# Patient Record
Sex: Male | Born: 1935
Health system: Southern US, Community
[De-identification: ages and names within clinical notes are randomized; demographics above are authoritative.]

## PROBLEM LIST (undated history)

## (undated) DIAGNOSIS — R1310 Dysphagia, unspecified: Secondary | ICD-10-CM

## (undated) DIAGNOSIS — G309 Alzheimer's disease, unspecified: Secondary | ICD-10-CM

## (undated) DIAGNOSIS — I82409 Acute embolism and thrombosis of unspecified deep veins of unspecified lower extremity: Secondary | ICD-10-CM

## (undated) DIAGNOSIS — E46 Unspecified protein-calorie malnutrition: Secondary | ICD-10-CM

## (undated) DIAGNOSIS — F0390 Unspecified dementia without behavioral disturbance: Secondary | ICD-10-CM

## (undated) DIAGNOSIS — N289 Disorder of kidney and ureter, unspecified: Secondary | ICD-10-CM

## (undated) DIAGNOSIS — U071 COVID-19: Secondary | ICD-10-CM

## (undated) DIAGNOSIS — G9341 Metabolic encephalopathy: Secondary | ICD-10-CM

## (undated) DIAGNOSIS — F0280 Dementia in other diseases classified elsewhere without behavioral disturbance: Secondary | ICD-10-CM

## (undated) DIAGNOSIS — K8042 Calculus of bile duct with acute cholecystitis without obstruction: Secondary | ICD-10-CM

## (undated) DIAGNOSIS — R41841 Cognitive communication deficit: Secondary | ICD-10-CM

## (undated) DIAGNOSIS — R55 Syncope and collapse: Secondary | ICD-10-CM

## (undated) DIAGNOSIS — E079 Disorder of thyroid, unspecified: Secondary | ICD-10-CM

## (undated) HISTORY — PX: APPENDECTOMY: SHX54

## (undated) HISTORY — PX: TONSILLECTOMY: SUR1361

---

## 2015-10-23 DIAGNOSIS — Z681 Body mass index (BMI) 19 or less, adult: Secondary | ICD-10-CM | POA: Diagnosis not present

## 2015-10-23 DIAGNOSIS — N39498 Other specified urinary incontinence: Secondary | ICD-10-CM | POA: Diagnosis not present

## 2015-10-23 DIAGNOSIS — R5383 Other fatigue: Secondary | ICD-10-CM | POA: Diagnosis not present

## 2015-10-23 DIAGNOSIS — I10 Essential (primary) hypertension: Secondary | ICD-10-CM | POA: Diagnosis not present

## 2016-01-18 DIAGNOSIS — Z681 Body mass index (BMI) 19 or less, adult: Secondary | ICD-10-CM | POA: Diagnosis not present

## 2016-01-18 DIAGNOSIS — I10 Essential (primary) hypertension: Secondary | ICD-10-CM | POA: Diagnosis not present

## 2016-01-18 DIAGNOSIS — G20 Parkinson's disease: Secondary | ICD-10-CM | POA: Diagnosis not present

## 2016-02-04 DIAGNOSIS — N178 Other acute kidney failure: Secondary | ICD-10-CM | POA: Diagnosis not present

## 2016-02-04 DIAGNOSIS — Z8249 Family history of ischemic heart disease and other diseases of the circulatory system: Secondary | ICD-10-CM | POA: Diagnosis not present

## 2016-02-04 DIAGNOSIS — R2689 Other abnormalities of gait and mobility: Secondary | ICD-10-CM | POA: Diagnosis not present

## 2016-02-04 DIAGNOSIS — Z880 Allergy status to penicillin: Secondary | ICD-10-CM | POA: Diagnosis not present

## 2016-02-04 DIAGNOSIS — F0280 Dementia in other diseases classified elsewhere without behavioral disturbance: Secondary | ICD-10-CM | POA: Diagnosis present

## 2016-02-04 DIAGNOSIS — F0390 Unspecified dementia without behavioral disturbance: Secondary | ICD-10-CM | POA: Diagnosis not present

## 2016-02-04 DIAGNOSIS — I10 Essential (primary) hypertension: Secondary | ICD-10-CM | POA: Diagnosis present

## 2016-02-04 DIAGNOSIS — E440 Moderate protein-calorie malnutrition: Secondary | ICD-10-CM | POA: Diagnosis not present

## 2016-02-04 DIAGNOSIS — R1310 Dysphagia, unspecified: Secondary | ICD-10-CM | POA: Diagnosis present

## 2016-02-04 DIAGNOSIS — R6889 Other general symptoms and signs: Secondary | ICD-10-CM | POA: Diagnosis present

## 2016-02-04 DIAGNOSIS — E870 Hyperosmolality and hypernatremia: Secondary | ICD-10-CM | POA: Diagnosis not present

## 2016-02-04 DIAGNOSIS — F329 Major depressive disorder, single episode, unspecified: Secondary | ICD-10-CM | POA: Diagnosis not present

## 2016-02-04 DIAGNOSIS — Z79899 Other long term (current) drug therapy: Secondary | ICD-10-CM | POA: Diagnosis not present

## 2016-02-04 DIAGNOSIS — Z87891 Personal history of nicotine dependence: Secondary | ICD-10-CM | POA: Diagnosis not present

## 2016-02-04 DIAGNOSIS — N19 Unspecified kidney failure: Secondary | ICD-10-CM | POA: Diagnosis not present

## 2016-02-04 DIAGNOSIS — D696 Thrombocytopenia, unspecified: Secondary | ICD-10-CM | POA: Diagnosis present

## 2016-02-04 DIAGNOSIS — G309 Alzheimer's disease, unspecified: Secondary | ICD-10-CM | POA: Diagnosis present

## 2016-02-04 DIAGNOSIS — R531 Weakness: Secondary | ICD-10-CM | POA: Diagnosis not present

## 2016-02-04 DIAGNOSIS — Z7982 Long term (current) use of aspirin: Secondary | ICD-10-CM | POA: Diagnosis not present

## 2016-02-04 DIAGNOSIS — Z681 Body mass index (BMI) 19 or less, adult: Secondary | ICD-10-CM | POA: Diagnosis not present

## 2016-02-04 DIAGNOSIS — E860 Dehydration: Secondary | ICD-10-CM | POA: Diagnosis not present

## 2016-02-04 DIAGNOSIS — M6281 Muscle weakness (generalized): Secondary | ICD-10-CM | POA: Diagnosis not present

## 2016-02-04 DIAGNOSIS — R1312 Dysphagia, oropharyngeal phase: Secondary | ICD-10-CM | POA: Diagnosis not present

## 2016-02-04 DIAGNOSIS — N179 Acute kidney failure, unspecified: Secondary | ICD-10-CM | POA: Diagnosis not present

## 2016-02-04 DIAGNOSIS — N390 Urinary tract infection, site not specified: Secondary | ICD-10-CM | POA: Diagnosis not present

## 2016-02-04 DIAGNOSIS — R7989 Other specified abnormal findings of blood chemistry: Secondary | ICD-10-CM | POA: Diagnosis not present

## 2016-02-04 DIAGNOSIS — Z825 Family history of asthma and other chronic lower respiratory diseases: Secondary | ICD-10-CM | POA: Diagnosis not present

## 2016-02-04 DIAGNOSIS — R930 Abnormal findings on diagnostic imaging of skull and head, not elsewhere classified: Secondary | ICD-10-CM | POA: Diagnosis not present

## 2016-02-08 DIAGNOSIS — Z681 Body mass index (BMI) 19 or less, adult: Secondary | ICD-10-CM | POA: Diagnosis not present

## 2016-02-08 DIAGNOSIS — R1312 Dysphagia, oropharyngeal phase: Secondary | ICD-10-CM | POA: Diagnosis not present

## 2016-02-08 DIAGNOSIS — N178 Other acute kidney failure: Secondary | ICD-10-CM | POA: Diagnosis not present

## 2016-02-08 DIAGNOSIS — F329 Major depressive disorder, single episode, unspecified: Secondary | ICD-10-CM | POA: Diagnosis not present

## 2016-02-08 DIAGNOSIS — E440 Moderate protein-calorie malnutrition: Secondary | ICD-10-CM | POA: Diagnosis not present

## 2016-02-08 DIAGNOSIS — N390 Urinary tract infection, site not specified: Secondary | ICD-10-CM | POA: Diagnosis not present

## 2016-02-08 DIAGNOSIS — R2689 Other abnormalities of gait and mobility: Secondary | ICD-10-CM | POA: Diagnosis not present

## 2016-02-08 DIAGNOSIS — E870 Hyperosmolality and hypernatremia: Secondary | ICD-10-CM | POA: Diagnosis not present

## 2016-02-08 DIAGNOSIS — M6281 Muscle weakness (generalized): Secondary | ICD-10-CM | POA: Diagnosis not present

## 2016-02-08 DIAGNOSIS — F0390 Unspecified dementia without behavioral disturbance: Secondary | ICD-10-CM | POA: Diagnosis not present

## 2016-02-08 DIAGNOSIS — R6889 Other general symptoms and signs: Secondary | ICD-10-CM | POA: Diagnosis not present

## 2016-02-08 DIAGNOSIS — N179 Acute kidney failure, unspecified: Secondary | ICD-10-CM | POA: Diagnosis not present

## 2016-02-29 DIAGNOSIS — F329 Major depressive disorder, single episode, unspecified: Secondary | ICD-10-CM | POA: Diagnosis not present

## 2016-02-29 DIAGNOSIS — F0390 Unspecified dementia without behavioral disturbance: Secondary | ICD-10-CM | POA: Diagnosis not present

## 2016-02-29 DIAGNOSIS — R1312 Dysphagia, oropharyngeal phase: Secondary | ICD-10-CM | POA: Diagnosis not present

## 2016-02-29 DIAGNOSIS — E440 Moderate protein-calorie malnutrition: Secondary | ICD-10-CM | POA: Diagnosis not present

## 2016-03-04 DIAGNOSIS — F0390 Unspecified dementia without behavioral disturbance: Secondary | ICD-10-CM | POA: Diagnosis not present

## 2016-03-04 DIAGNOSIS — R1312 Dysphagia, oropharyngeal phase: Secondary | ICD-10-CM | POA: Diagnosis not present

## 2016-03-04 DIAGNOSIS — F329 Major depressive disorder, single episode, unspecified: Secondary | ICD-10-CM | POA: Diagnosis not present

## 2016-03-04 DIAGNOSIS — E440 Moderate protein-calorie malnutrition: Secondary | ICD-10-CM | POA: Diagnosis not present

## 2016-03-07 DIAGNOSIS — Z1389 Encounter for screening for other disorder: Secondary | ICD-10-CM | POA: Diagnosis not present

## 2016-03-07 DIAGNOSIS — E860 Dehydration: Secondary | ICD-10-CM | POA: Diagnosis not present

## 2016-03-07 DIAGNOSIS — F0151 Vascular dementia with behavioral disturbance: Secondary | ICD-10-CM | POA: Diagnosis not present

## 2016-03-07 DIAGNOSIS — Z0000 Encounter for general adult medical examination without abnormal findings: Secondary | ICD-10-CM | POA: Diagnosis not present

## 2016-03-08 DIAGNOSIS — F329 Major depressive disorder, single episode, unspecified: Secondary | ICD-10-CM | POA: Diagnosis not present

## 2016-03-08 DIAGNOSIS — R1312 Dysphagia, oropharyngeal phase: Secondary | ICD-10-CM | POA: Diagnosis not present

## 2016-03-08 DIAGNOSIS — E440 Moderate protein-calorie malnutrition: Secondary | ICD-10-CM | POA: Diagnosis not present

## 2016-03-08 DIAGNOSIS — F0390 Unspecified dementia without behavioral disturbance: Secondary | ICD-10-CM | POA: Diagnosis not present

## 2016-03-11 DIAGNOSIS — F329 Major depressive disorder, single episode, unspecified: Secondary | ICD-10-CM | POA: Diagnosis not present

## 2016-03-11 DIAGNOSIS — F0390 Unspecified dementia without behavioral disturbance: Secondary | ICD-10-CM | POA: Diagnosis not present

## 2016-03-11 DIAGNOSIS — E440 Moderate protein-calorie malnutrition: Secondary | ICD-10-CM | POA: Diagnosis not present

## 2016-03-11 DIAGNOSIS — R1312 Dysphagia, oropharyngeal phase: Secondary | ICD-10-CM | POA: Diagnosis not present

## 2016-03-12 DIAGNOSIS — F329 Major depressive disorder, single episode, unspecified: Secondary | ICD-10-CM | POA: Diagnosis not present

## 2016-03-12 DIAGNOSIS — R1312 Dysphagia, oropharyngeal phase: Secondary | ICD-10-CM | POA: Diagnosis not present

## 2016-03-12 DIAGNOSIS — F0390 Unspecified dementia without behavioral disturbance: Secondary | ICD-10-CM | POA: Diagnosis not present

## 2016-03-12 DIAGNOSIS — E440 Moderate protein-calorie malnutrition: Secondary | ICD-10-CM | POA: Diagnosis not present

## 2016-03-14 DIAGNOSIS — F0390 Unspecified dementia without behavioral disturbance: Secondary | ICD-10-CM | POA: Diagnosis not present

## 2016-03-14 DIAGNOSIS — F329 Major depressive disorder, single episode, unspecified: Secondary | ICD-10-CM | POA: Diagnosis not present

## 2016-03-14 DIAGNOSIS — R1312 Dysphagia, oropharyngeal phase: Secondary | ICD-10-CM | POA: Diagnosis not present

## 2016-03-14 DIAGNOSIS — E440 Moderate protein-calorie malnutrition: Secondary | ICD-10-CM | POA: Diagnosis not present

## 2016-03-18 DIAGNOSIS — R1312 Dysphagia, oropharyngeal phase: Secondary | ICD-10-CM | POA: Diagnosis not present

## 2016-03-18 DIAGNOSIS — E440 Moderate protein-calorie malnutrition: Secondary | ICD-10-CM | POA: Diagnosis not present

## 2016-03-18 DIAGNOSIS — F329 Major depressive disorder, single episode, unspecified: Secondary | ICD-10-CM | POA: Diagnosis not present

## 2016-03-18 DIAGNOSIS — F0390 Unspecified dementia without behavioral disturbance: Secondary | ICD-10-CM | POA: Diagnosis not present

## 2016-03-19 DIAGNOSIS — F329 Major depressive disorder, single episode, unspecified: Secondary | ICD-10-CM | POA: Diagnosis not present

## 2016-03-19 DIAGNOSIS — R1312 Dysphagia, oropharyngeal phase: Secondary | ICD-10-CM | POA: Diagnosis not present

## 2016-03-19 DIAGNOSIS — F0390 Unspecified dementia without behavioral disturbance: Secondary | ICD-10-CM | POA: Diagnosis not present

## 2016-03-19 DIAGNOSIS — E440 Moderate protein-calorie malnutrition: Secondary | ICD-10-CM | POA: Diagnosis not present

## 2016-03-20 DIAGNOSIS — R1312 Dysphagia, oropharyngeal phase: Secondary | ICD-10-CM | POA: Diagnosis not present

## 2016-03-20 DIAGNOSIS — E440 Moderate protein-calorie malnutrition: Secondary | ICD-10-CM | POA: Diagnosis not present

## 2016-03-20 DIAGNOSIS — F329 Major depressive disorder, single episode, unspecified: Secondary | ICD-10-CM | POA: Diagnosis not present

## 2016-03-20 DIAGNOSIS — F0390 Unspecified dementia without behavioral disturbance: Secondary | ICD-10-CM | POA: Diagnosis not present

## 2016-03-22 DIAGNOSIS — E440 Moderate protein-calorie malnutrition: Secondary | ICD-10-CM | POA: Diagnosis not present

## 2016-03-22 DIAGNOSIS — R1312 Dysphagia, oropharyngeal phase: Secondary | ICD-10-CM | POA: Diagnosis not present

## 2016-03-22 DIAGNOSIS — F0390 Unspecified dementia without behavioral disturbance: Secondary | ICD-10-CM | POA: Diagnosis not present

## 2016-03-22 DIAGNOSIS — F329 Major depressive disorder, single episode, unspecified: Secondary | ICD-10-CM | POA: Diagnosis not present

## 2016-03-25 DIAGNOSIS — F0390 Unspecified dementia without behavioral disturbance: Secondary | ICD-10-CM | POA: Diagnosis not present

## 2016-03-25 DIAGNOSIS — R1312 Dysphagia, oropharyngeal phase: Secondary | ICD-10-CM | POA: Diagnosis not present

## 2016-03-25 DIAGNOSIS — F329 Major depressive disorder, single episode, unspecified: Secondary | ICD-10-CM | POA: Diagnosis not present

## 2016-03-25 DIAGNOSIS — E440 Moderate protein-calorie malnutrition: Secondary | ICD-10-CM | POA: Diagnosis not present

## 2016-03-27 DIAGNOSIS — E440 Moderate protein-calorie malnutrition: Secondary | ICD-10-CM | POA: Diagnosis not present

## 2016-03-27 DIAGNOSIS — R1312 Dysphagia, oropharyngeal phase: Secondary | ICD-10-CM | POA: Diagnosis not present

## 2016-03-27 DIAGNOSIS — F0390 Unspecified dementia without behavioral disturbance: Secondary | ICD-10-CM | POA: Diagnosis not present

## 2016-03-27 DIAGNOSIS — F329 Major depressive disorder, single episode, unspecified: Secondary | ICD-10-CM | POA: Diagnosis not present

## 2016-03-29 DIAGNOSIS — F329 Major depressive disorder, single episode, unspecified: Secondary | ICD-10-CM | POA: Diagnosis not present

## 2016-03-29 DIAGNOSIS — E440 Moderate protein-calorie malnutrition: Secondary | ICD-10-CM | POA: Diagnosis not present

## 2016-03-29 DIAGNOSIS — R1312 Dysphagia, oropharyngeal phase: Secondary | ICD-10-CM | POA: Diagnosis not present

## 2016-03-29 DIAGNOSIS — F0390 Unspecified dementia without behavioral disturbance: Secondary | ICD-10-CM | POA: Diagnosis not present

## 2016-04-01 DIAGNOSIS — R1312 Dysphagia, oropharyngeal phase: Secondary | ICD-10-CM | POA: Diagnosis not present

## 2016-04-01 DIAGNOSIS — E440 Moderate protein-calorie malnutrition: Secondary | ICD-10-CM | POA: Diagnosis not present

## 2016-04-01 DIAGNOSIS — F329 Major depressive disorder, single episode, unspecified: Secondary | ICD-10-CM | POA: Diagnosis not present

## 2016-04-01 DIAGNOSIS — F0390 Unspecified dementia without behavioral disturbance: Secondary | ICD-10-CM | POA: Diagnosis not present

## 2016-07-01 DIAGNOSIS — E860 Dehydration: Secondary | ICD-10-CM | POA: Diagnosis not present

## 2016-07-01 DIAGNOSIS — S199XXA Unspecified injury of neck, initial encounter: Secondary | ICD-10-CM | POA: Diagnosis not present

## 2016-07-01 DIAGNOSIS — Z681 Body mass index (BMI) 19 or less, adult: Secondary | ICD-10-CM | POA: Diagnosis not present

## 2016-07-01 DIAGNOSIS — E440 Moderate protein-calorie malnutrition: Secondary | ICD-10-CM | POA: Diagnosis not present

## 2016-07-01 DIAGNOSIS — R4189 Other symptoms and signs involving cognitive functions and awareness: Secondary | ICD-10-CM | POA: Diagnosis not present

## 2016-07-01 DIAGNOSIS — E870 Hyperosmolality and hypernatremia: Secondary | ICD-10-CM | POA: Diagnosis not present

## 2016-07-01 DIAGNOSIS — R4182 Altered mental status, unspecified: Secondary | ICD-10-CM | POA: Diagnosis not present

## 2016-07-01 DIAGNOSIS — W1839XA Other fall on same level, initial encounter: Secondary | ICD-10-CM | POA: Diagnosis not present

## 2016-07-01 DIAGNOSIS — E46 Unspecified protein-calorie malnutrition: Secondary | ICD-10-CM | POA: Diagnosis not present

## 2016-07-01 DIAGNOSIS — S299XXA Unspecified injury of thorax, initial encounter: Secondary | ICD-10-CM | POA: Diagnosis not present

## 2016-07-01 DIAGNOSIS — F0280 Dementia in other diseases classified elsewhere without behavioral disturbance: Secondary | ICD-10-CM | POA: Diagnosis not present

## 2016-07-01 DIAGNOSIS — S80811A Abrasion, right lower leg, initial encounter: Secondary | ICD-10-CM | POA: Diagnosis not present

## 2016-07-01 DIAGNOSIS — G308 Other Alzheimer's disease: Secondary | ICD-10-CM | POA: Diagnosis not present

## 2016-07-01 DIAGNOSIS — S0990XA Unspecified injury of head, initial encounter: Secondary | ICD-10-CM | POA: Diagnosis not present

## 2016-07-01 DIAGNOSIS — G309 Alzheimer's disease, unspecified: Secondary | ICD-10-CM | POA: Diagnosis not present

## 2016-07-01 DIAGNOSIS — Z880 Allergy status to penicillin: Secondary | ICD-10-CM | POA: Diagnosis not present

## 2016-07-02 DIAGNOSIS — E440 Moderate protein-calorie malnutrition: Secondary | ICD-10-CM | POA: Diagnosis not present

## 2016-07-02 DIAGNOSIS — E860 Dehydration: Secondary | ICD-10-CM | POA: Diagnosis not present

## 2016-07-02 DIAGNOSIS — R4189 Other symptoms and signs involving cognitive functions and awareness: Secondary | ICD-10-CM | POA: Diagnosis not present

## 2016-07-02 DIAGNOSIS — I82401 Acute embolism and thrombosis of unspecified deep veins of right lower extremity: Secondary | ICD-10-CM | POA: Diagnosis not present

## 2016-07-02 DIAGNOSIS — G308 Other Alzheimer's disease: Secondary | ICD-10-CM | POA: Diagnosis not present

## 2016-07-02 DIAGNOSIS — F0280 Dementia in other diseases classified elsewhere without behavioral disturbance: Secondary | ICD-10-CM | POA: Diagnosis not present

## 2016-07-03 DIAGNOSIS — E860 Dehydration: Secondary | ICD-10-CM | POA: Diagnosis not present

## 2016-07-03 DIAGNOSIS — E440 Moderate protein-calorie malnutrition: Secondary | ICD-10-CM | POA: Diagnosis not present

## 2016-07-03 DIAGNOSIS — F0280 Dementia in other diseases classified elsewhere without behavioral disturbance: Secondary | ICD-10-CM | POA: Diagnosis not present

## 2016-07-03 DIAGNOSIS — G308 Other Alzheimer's disease: Secondary | ICD-10-CM | POA: Diagnosis not present

## 2016-07-03 DIAGNOSIS — R4189 Other symptoms and signs involving cognitive functions and awareness: Secondary | ICD-10-CM | POA: Diagnosis not present

## 2016-07-11 DIAGNOSIS — E860 Dehydration: Secondary | ICD-10-CM | POA: Diagnosis not present

## 2016-07-11 DIAGNOSIS — F0151 Vascular dementia with behavioral disturbance: Secondary | ICD-10-CM | POA: Diagnosis not present

## 2016-08-13 DIAGNOSIS — N179 Acute kidney failure, unspecified: Secondary | ICD-10-CM | POA: Diagnosis not present

## 2016-08-13 DIAGNOSIS — E43 Unspecified severe protein-calorie malnutrition: Secondary | ICD-10-CM | POA: Diagnosis not present

## 2016-08-13 DIAGNOSIS — R001 Bradycardia, unspecified: Secondary | ICD-10-CM | POA: Diagnosis not present

## 2016-08-13 DIAGNOSIS — Z87891 Personal history of nicotine dependence: Secondary | ICD-10-CM | POA: Diagnosis not present

## 2016-08-13 DIAGNOSIS — N390 Urinary tract infection, site not specified: Secondary | ICD-10-CM | POA: Diagnosis not present

## 2016-08-13 DIAGNOSIS — I10 Essential (primary) hypertension: Secondary | ICD-10-CM | POA: Diagnosis not present

## 2016-08-13 DIAGNOSIS — Z7902 Long term (current) use of antithrombotics/antiplatelets: Secondary | ICD-10-CM | POA: Diagnosis not present

## 2016-08-13 DIAGNOSIS — J9811 Atelectasis: Secondary | ICD-10-CM | POA: Diagnosis not present

## 2016-08-13 DIAGNOSIS — F0390 Unspecified dementia without behavioral disturbance: Secondary | ICD-10-CM | POA: Diagnosis not present

## 2016-08-13 DIAGNOSIS — Z681 Body mass index (BMI) 19 or less, adult: Secondary | ICD-10-CM | POA: Diagnosis not present

## 2016-08-13 DIAGNOSIS — Z79899 Other long term (current) drug therapy: Secondary | ICD-10-CM | POA: Diagnosis not present

## 2016-08-13 DIAGNOSIS — R55 Syncope and collapse: Secondary | ICD-10-CM | POA: Diagnosis not present

## 2016-08-13 DIAGNOSIS — R319 Hematuria, unspecified: Secondary | ICD-10-CM | POA: Diagnosis not present

## 2016-08-13 DIAGNOSIS — S42291A Other displaced fracture of upper end of right humerus, initial encounter for closed fracture: Secondary | ICD-10-CM | POA: Diagnosis not present

## 2016-08-13 DIAGNOSIS — X58XXXA Exposure to other specified factors, initial encounter: Secondary | ICD-10-CM | POA: Diagnosis not present

## 2016-08-13 DIAGNOSIS — N189 Chronic kidney disease, unspecified: Secondary | ICD-10-CM | POA: Diagnosis not present

## 2016-08-15 DIAGNOSIS — N178 Other acute kidney failure: Secondary | ICD-10-CM | POA: Diagnosis not present

## 2016-08-15 DIAGNOSIS — R1310 Dysphagia, unspecified: Secondary | ICD-10-CM | POA: Diagnosis present

## 2016-08-15 DIAGNOSIS — R319 Hematuria, unspecified: Secondary | ICD-10-CM | POA: Diagnosis not present

## 2016-08-15 DIAGNOSIS — R531 Weakness: Secondary | ICD-10-CM | POA: Diagnosis not present

## 2016-08-15 DIAGNOSIS — R001 Bradycardia, unspecified: Secondary | ICD-10-CM | POA: Diagnosis not present

## 2016-08-15 DIAGNOSIS — N390 Urinary tract infection, site not specified: Secondary | ICD-10-CM | POA: Diagnosis not present

## 2016-08-15 DIAGNOSIS — F419 Anxiety disorder, unspecified: Secondary | ICD-10-CM | POA: Diagnosis not present

## 2016-08-15 DIAGNOSIS — S4291XD Fracture of right shoulder girdle, part unspecified, subsequent encounter for fracture with routine healing: Secondary | ICD-10-CM | POA: Diagnosis not present

## 2016-08-15 DIAGNOSIS — R682 Dry mouth, unspecified: Secondary | ICD-10-CM | POA: Diagnosis not present

## 2016-08-15 DIAGNOSIS — I499 Cardiac arrhythmia, unspecified: Secondary | ICD-10-CM | POA: Diagnosis not present

## 2016-08-15 DIAGNOSIS — N189 Chronic kidney disease, unspecified: Secondary | ICD-10-CM | POA: Diagnosis not present

## 2016-08-15 DIAGNOSIS — Z66 Do not resuscitate: Secondary | ICD-10-CM | POA: Diagnosis not present

## 2016-08-15 DIAGNOSIS — E860 Dehydration: Secondary | ICD-10-CM | POA: Diagnosis present

## 2016-08-15 DIAGNOSIS — R112 Nausea with vomiting, unspecified: Secondary | ICD-10-CM | POA: Diagnosis not present

## 2016-08-15 DIAGNOSIS — Z681 Body mass index (BMI) 19 or less, adult: Secondary | ICD-10-CM | POA: Diagnosis not present

## 2016-08-15 DIAGNOSIS — E43 Unspecified severe protein-calorie malnutrition: Secondary | ICD-10-CM | POA: Diagnosis present

## 2016-08-15 DIAGNOSIS — F0280 Dementia in other diseases classified elsewhere without behavioral disturbance: Secondary | ICD-10-CM | POA: Diagnosis present

## 2016-08-15 DIAGNOSIS — R55 Syncope and collapse: Secondary | ICD-10-CM | POA: Diagnosis not present

## 2016-08-15 DIAGNOSIS — R6889 Other general symptoms and signs: Secondary | ICD-10-CM | POA: Diagnosis not present

## 2016-08-15 DIAGNOSIS — K5900 Constipation, unspecified: Secondary | ICD-10-CM | POA: Diagnosis not present

## 2016-08-15 DIAGNOSIS — N179 Acute kidney failure, unspecified: Secondary | ICD-10-CM | POA: Diagnosis present

## 2016-08-15 DIAGNOSIS — E440 Moderate protein-calorie malnutrition: Secondary | ICD-10-CM | POA: Diagnosis not present

## 2016-08-15 DIAGNOSIS — J9811 Atelectasis: Secondary | ICD-10-CM | POA: Diagnosis not present

## 2016-08-15 DIAGNOSIS — R5381 Other malaise: Secondary | ICD-10-CM | POA: Diagnosis present

## 2016-08-15 DIAGNOSIS — G309 Alzheimer's disease, unspecified: Secondary | ICD-10-CM | POA: Diagnosis present

## 2016-09-12 DIAGNOSIS — M6281 Muscle weakness (generalized): Secondary | ICD-10-CM | POA: Diagnosis not present

## 2016-09-12 DIAGNOSIS — R682 Dry mouth, unspecified: Secondary | ICD-10-CM | POA: Diagnosis not present

## 2016-09-12 DIAGNOSIS — S42201A Unspecified fracture of upper end of right humerus, initial encounter for closed fracture: Secondary | ICD-10-CM | POA: Diagnosis not present

## 2016-09-12 DIAGNOSIS — R4189 Other symptoms and signs involving cognitive functions and awareness: Secondary | ICD-10-CM | POA: Diagnosis not present

## 2016-09-12 DIAGNOSIS — R55 Syncope and collapse: Secondary | ICD-10-CM | POA: Diagnosis not present

## 2016-09-12 DIAGNOSIS — R278 Other lack of coordination: Secondary | ICD-10-CM | POA: Diagnosis not present

## 2016-09-12 DIAGNOSIS — N179 Acute kidney failure, unspecified: Secondary | ICD-10-CM | POA: Diagnosis not present

## 2016-09-12 DIAGNOSIS — S42201D Unspecified fracture of upper end of right humerus, subsequent encounter for fracture with routine healing: Secondary | ICD-10-CM | POA: Diagnosis not present

## 2016-09-12 DIAGNOSIS — Z66 Do not resuscitate: Secondary | ICD-10-CM | POA: Diagnosis not present

## 2016-09-12 DIAGNOSIS — M79674 Pain in right toe(s): Secondary | ICD-10-CM | POA: Diagnosis not present

## 2016-09-12 DIAGNOSIS — N183 Chronic kidney disease, stage 3 (moderate): Secondary | ICD-10-CM | POA: Diagnosis not present

## 2016-09-12 DIAGNOSIS — F419 Anxiety disorder, unspecified: Secondary | ICD-10-CM | POA: Diagnosis not present

## 2016-09-12 DIAGNOSIS — B351 Tinea unguium: Secondary | ICD-10-CM | POA: Diagnosis not present

## 2016-09-12 DIAGNOSIS — E43 Unspecified severe protein-calorie malnutrition: Secondary | ICD-10-CM | POA: Diagnosis not present

## 2016-09-12 DIAGNOSIS — G300 Alzheimer's disease with early onset: Secondary | ICD-10-CM | POA: Diagnosis not present

## 2016-09-12 DIAGNOSIS — R1310 Dysphagia, unspecified: Secondary | ICD-10-CM | POA: Diagnosis not present

## 2016-09-12 DIAGNOSIS — R1319 Other dysphagia: Secondary | ICD-10-CM | POA: Diagnosis not present

## 2016-09-12 DIAGNOSIS — K5900 Constipation, unspecified: Secondary | ICD-10-CM | POA: Diagnosis not present

## 2016-09-12 DIAGNOSIS — R001 Bradycardia, unspecified: Secondary | ICD-10-CM | POA: Diagnosis not present

## 2016-09-12 DIAGNOSIS — G308 Other Alzheimer's disease: Secondary | ICD-10-CM | POA: Diagnosis not present

## 2016-11-16 DIAGNOSIS — E860 Dehydration: Secondary | ICD-10-CM | POA: Diagnosis not present

## 2016-11-16 DIAGNOSIS — B961 Klebsiella pneumoniae [K. pneumoniae] as the cause of diseases classified elsewhere: Secondary | ICD-10-CM | POA: Diagnosis not present

## 2016-11-16 DIAGNOSIS — E440 Moderate protein-calorie malnutrition: Secondary | ICD-10-CM | POA: Diagnosis not present

## 2016-11-16 DIAGNOSIS — R319 Hematuria, unspecified: Secondary | ICD-10-CM | POA: Diagnosis not present

## 2016-11-16 DIAGNOSIS — R05 Cough: Secondary | ICD-10-CM | POA: Diagnosis not present

## 2016-11-16 DIAGNOSIS — Z681 Body mass index (BMI) 19 or less, adult: Secondary | ICD-10-CM | POA: Diagnosis not present

## 2016-11-16 DIAGNOSIS — J1089 Influenza due to other identified influenza virus with other manifestations: Secondary | ICD-10-CM | POA: Diagnosis not present

## 2016-11-16 DIAGNOSIS — J101 Influenza due to other identified influenza virus with other respiratory manifestations: Secondary | ICD-10-CM | POA: Diagnosis not present

## 2016-11-16 DIAGNOSIS — N390 Urinary tract infection, site not specified: Secondary | ICD-10-CM | POA: Diagnosis not present

## 2016-11-17 DIAGNOSIS — Z825 Family history of asthma and other chronic lower respiratory diseases: Secondary | ICD-10-CM | POA: Diagnosis not present

## 2016-11-17 DIAGNOSIS — Z79899 Other long term (current) drug therapy: Secondary | ICD-10-CM | POA: Diagnosis not present

## 2016-11-17 DIAGNOSIS — B961 Klebsiella pneumoniae [K. pneumoniae] as the cause of diseases classified elsewhere: Secondary | ICD-10-CM | POA: Diagnosis not present

## 2016-11-17 DIAGNOSIS — Z87891 Personal history of nicotine dependence: Secondary | ICD-10-CM | POA: Diagnosis not present

## 2016-11-17 DIAGNOSIS — F0280 Dementia in other diseases classified elsewhere without behavioral disturbance: Secondary | ICD-10-CM | POA: Diagnosis present

## 2016-11-17 DIAGNOSIS — Z86718 Personal history of other venous thrombosis and embolism: Secondary | ICD-10-CM | POA: Diagnosis not present

## 2016-11-17 DIAGNOSIS — N390 Urinary tract infection, site not specified: Secondary | ICD-10-CM | POA: Diagnosis not present

## 2016-11-17 DIAGNOSIS — E860 Dehydration: Secondary | ICD-10-CM | POA: Diagnosis present

## 2016-11-17 DIAGNOSIS — Z880 Allergy status to penicillin: Secondary | ICD-10-CM | POA: Diagnosis not present

## 2016-11-17 DIAGNOSIS — G309 Alzheimer's disease, unspecified: Secondary | ICD-10-CM | POA: Diagnosis present

## 2016-11-17 DIAGNOSIS — J101 Influenza due to other identified influenza virus with other respiratory manifestations: Secondary | ICD-10-CM | POA: Diagnosis not present

## 2016-11-17 DIAGNOSIS — Z7901 Long term (current) use of anticoagulants: Secondary | ICD-10-CM | POA: Diagnosis not present

## 2016-11-17 DIAGNOSIS — E440 Moderate protein-calorie malnutrition: Secondary | ICD-10-CM | POA: Diagnosis not present

## 2016-11-17 DIAGNOSIS — Z681 Body mass index (BMI) 19 or less, adult: Secondary | ICD-10-CM | POA: Diagnosis not present

## 2016-11-17 DIAGNOSIS — Z8249 Family history of ischemic heart disease and other diseases of the circulatory system: Secondary | ICD-10-CM | POA: Diagnosis not present

## 2016-11-21 DIAGNOSIS — Z87891 Personal history of nicotine dependence: Secondary | ICD-10-CM | POA: Diagnosis not present

## 2016-11-21 DIAGNOSIS — J09X2 Influenza due to identified novel influenza A virus with other respiratory manifestations: Secondary | ICD-10-CM | POA: Diagnosis not present

## 2016-11-21 DIAGNOSIS — Z8781 Personal history of (healed) traumatic fracture: Secondary | ICD-10-CM | POA: Diagnosis not present

## 2016-11-21 DIAGNOSIS — G309 Alzheimer's disease, unspecified: Secondary | ICD-10-CM | POA: Diagnosis not present

## 2016-11-21 DIAGNOSIS — Z86718 Personal history of other venous thrombosis and embolism: Secondary | ICD-10-CM | POA: Diagnosis not present

## 2016-11-21 DIAGNOSIS — F0280 Dementia in other diseases classified elsewhere without behavioral disturbance: Secondary | ICD-10-CM | POA: Diagnosis not present

## 2016-11-21 DIAGNOSIS — N390 Urinary tract infection, site not specified: Secondary | ICD-10-CM | POA: Diagnosis not present

## 2016-11-21 DIAGNOSIS — B961 Klebsiella pneumoniae [K. pneumoniae] as the cause of diseases classified elsewhere: Secondary | ICD-10-CM | POA: Diagnosis not present

## 2016-11-26 DIAGNOSIS — N3000 Acute cystitis without hematuria: Secondary | ICD-10-CM | POA: Diagnosis not present

## 2016-11-26 DIAGNOSIS — N390 Urinary tract infection, site not specified: Secondary | ICD-10-CM | POA: Diagnosis not present

## 2016-11-26 DIAGNOSIS — G309 Alzheimer's disease, unspecified: Secondary | ICD-10-CM | POA: Diagnosis not present

## 2016-11-26 DIAGNOSIS — F0280 Dementia in other diseases classified elsewhere without behavioral disturbance: Secondary | ICD-10-CM | POA: Diagnosis not present

## 2016-11-26 DIAGNOSIS — F0151 Vascular dementia with behavioral disturbance: Secondary | ICD-10-CM | POA: Diagnosis not present

## 2016-11-26 DIAGNOSIS — Z86718 Personal history of other venous thrombosis and embolism: Secondary | ICD-10-CM | POA: Diagnosis not present

## 2016-11-26 DIAGNOSIS — B961 Klebsiella pneumoniae [K. pneumoniae] as the cause of diseases classified elsewhere: Secondary | ICD-10-CM | POA: Diagnosis not present

## 2016-11-26 DIAGNOSIS — J09X2 Influenza due to identified novel influenza A virus with other respiratory manifestations: Secondary | ICD-10-CM | POA: Diagnosis not present

## 2016-11-26 DIAGNOSIS — K645 Perianal venous thrombosis: Secondary | ICD-10-CM | POA: Diagnosis not present

## 2016-11-27 DIAGNOSIS — G309 Alzheimer's disease, unspecified: Secondary | ICD-10-CM | POA: Diagnosis not present

## 2016-11-27 DIAGNOSIS — N390 Urinary tract infection, site not specified: Secondary | ICD-10-CM | POA: Diagnosis not present

## 2016-11-27 DIAGNOSIS — B961 Klebsiella pneumoniae [K. pneumoniae] as the cause of diseases classified elsewhere: Secondary | ICD-10-CM | POA: Diagnosis not present

## 2016-11-27 DIAGNOSIS — J09X2 Influenza due to identified novel influenza A virus with other respiratory manifestations: Secondary | ICD-10-CM | POA: Diagnosis not present

## 2016-11-27 DIAGNOSIS — Z86718 Personal history of other venous thrombosis and embolism: Secondary | ICD-10-CM | POA: Diagnosis not present

## 2016-11-27 DIAGNOSIS — F0280 Dementia in other diseases classified elsewhere without behavioral disturbance: Secondary | ICD-10-CM | POA: Diagnosis not present

## 2016-11-29 DIAGNOSIS — B961 Klebsiella pneumoniae [K. pneumoniae] as the cause of diseases classified elsewhere: Secondary | ICD-10-CM | POA: Diagnosis not present

## 2016-11-29 DIAGNOSIS — Z86718 Personal history of other venous thrombosis and embolism: Secondary | ICD-10-CM | POA: Diagnosis not present

## 2016-11-29 DIAGNOSIS — F0280 Dementia in other diseases classified elsewhere without behavioral disturbance: Secondary | ICD-10-CM | POA: Diagnosis not present

## 2016-11-29 DIAGNOSIS — J09X2 Influenza due to identified novel influenza A virus with other respiratory manifestations: Secondary | ICD-10-CM | POA: Diagnosis not present

## 2016-11-29 DIAGNOSIS — N390 Urinary tract infection, site not specified: Secondary | ICD-10-CM | POA: Diagnosis not present

## 2016-11-29 DIAGNOSIS — G309 Alzheimer's disease, unspecified: Secondary | ICD-10-CM | POA: Diagnosis not present

## 2016-12-04 DIAGNOSIS — B961 Klebsiella pneumoniae [K. pneumoniae] as the cause of diseases classified elsewhere: Secondary | ICD-10-CM | POA: Diagnosis not present

## 2016-12-04 DIAGNOSIS — F0280 Dementia in other diseases classified elsewhere without behavioral disturbance: Secondary | ICD-10-CM | POA: Diagnosis not present

## 2016-12-04 DIAGNOSIS — Z86718 Personal history of other venous thrombosis and embolism: Secondary | ICD-10-CM | POA: Diagnosis not present

## 2016-12-04 DIAGNOSIS — J09X2 Influenza due to identified novel influenza A virus with other respiratory manifestations: Secondary | ICD-10-CM | POA: Diagnosis not present

## 2016-12-04 DIAGNOSIS — G309 Alzheimer's disease, unspecified: Secondary | ICD-10-CM | POA: Diagnosis not present

## 2016-12-04 DIAGNOSIS — N390 Urinary tract infection, site not specified: Secondary | ICD-10-CM | POA: Diagnosis not present

## 2016-12-06 DIAGNOSIS — J09X2 Influenza due to identified novel influenza A virus with other respiratory manifestations: Secondary | ICD-10-CM | POA: Diagnosis not present

## 2016-12-06 DIAGNOSIS — F0280 Dementia in other diseases classified elsewhere without behavioral disturbance: Secondary | ICD-10-CM | POA: Diagnosis not present

## 2016-12-06 DIAGNOSIS — B961 Klebsiella pneumoniae [K. pneumoniae] as the cause of diseases classified elsewhere: Secondary | ICD-10-CM | POA: Diagnosis not present

## 2016-12-06 DIAGNOSIS — G309 Alzheimer's disease, unspecified: Secondary | ICD-10-CM | POA: Diagnosis not present

## 2016-12-06 DIAGNOSIS — Z86718 Personal history of other venous thrombosis and embolism: Secondary | ICD-10-CM | POA: Diagnosis not present

## 2016-12-06 DIAGNOSIS — N390 Urinary tract infection, site not specified: Secondary | ICD-10-CM | POA: Diagnosis not present

## 2016-12-11 DIAGNOSIS — N390 Urinary tract infection, site not specified: Secondary | ICD-10-CM | POA: Diagnosis not present

## 2016-12-11 DIAGNOSIS — J09X2 Influenza due to identified novel influenza A virus with other respiratory manifestations: Secondary | ICD-10-CM | POA: Diagnosis not present

## 2016-12-11 DIAGNOSIS — B961 Klebsiella pneumoniae [K. pneumoniae] as the cause of diseases classified elsewhere: Secondary | ICD-10-CM | POA: Diagnosis not present

## 2016-12-11 DIAGNOSIS — Z86718 Personal history of other venous thrombosis and embolism: Secondary | ICD-10-CM | POA: Diagnosis not present

## 2016-12-11 DIAGNOSIS — G309 Alzheimer's disease, unspecified: Secondary | ICD-10-CM | POA: Diagnosis not present

## 2016-12-11 DIAGNOSIS — F0280 Dementia in other diseases classified elsewhere without behavioral disturbance: Secondary | ICD-10-CM | POA: Diagnosis not present

## 2016-12-13 DIAGNOSIS — Z86718 Personal history of other venous thrombosis and embolism: Secondary | ICD-10-CM | POA: Diagnosis not present

## 2016-12-13 DIAGNOSIS — J09X2 Influenza due to identified novel influenza A virus with other respiratory manifestations: Secondary | ICD-10-CM | POA: Diagnosis not present

## 2016-12-13 DIAGNOSIS — F0280 Dementia in other diseases classified elsewhere without behavioral disturbance: Secondary | ICD-10-CM | POA: Diagnosis not present

## 2016-12-13 DIAGNOSIS — G309 Alzheimer's disease, unspecified: Secondary | ICD-10-CM | POA: Diagnosis not present

## 2016-12-13 DIAGNOSIS — N390 Urinary tract infection, site not specified: Secondary | ICD-10-CM | POA: Diagnosis not present

## 2016-12-13 DIAGNOSIS — B961 Klebsiella pneumoniae [K. pneumoniae] as the cause of diseases classified elsewhere: Secondary | ICD-10-CM | POA: Diagnosis not present

## 2016-12-17 DIAGNOSIS — J09X2 Influenza due to identified novel influenza A virus with other respiratory manifestations: Secondary | ICD-10-CM | POA: Diagnosis not present

## 2016-12-17 DIAGNOSIS — G309 Alzheimer's disease, unspecified: Secondary | ICD-10-CM | POA: Diagnosis not present

## 2016-12-17 DIAGNOSIS — Z86718 Personal history of other venous thrombosis and embolism: Secondary | ICD-10-CM | POA: Diagnosis not present

## 2016-12-17 DIAGNOSIS — N390 Urinary tract infection, site not specified: Secondary | ICD-10-CM | POA: Diagnosis not present

## 2016-12-17 DIAGNOSIS — F0280 Dementia in other diseases classified elsewhere without behavioral disturbance: Secondary | ICD-10-CM | POA: Diagnosis not present

## 2016-12-17 DIAGNOSIS — B961 Klebsiella pneumoniae [K. pneumoniae] as the cause of diseases classified elsewhere: Secondary | ICD-10-CM | POA: Diagnosis not present

## 2016-12-18 DIAGNOSIS — G309 Alzheimer's disease, unspecified: Secondary | ICD-10-CM | POA: Diagnosis not present

## 2016-12-18 DIAGNOSIS — J09X2 Influenza due to identified novel influenza A virus with other respiratory manifestations: Secondary | ICD-10-CM | POA: Diagnosis not present

## 2016-12-18 DIAGNOSIS — F0280 Dementia in other diseases classified elsewhere without behavioral disturbance: Secondary | ICD-10-CM | POA: Diagnosis not present

## 2016-12-18 DIAGNOSIS — Z86718 Personal history of other venous thrombosis and embolism: Secondary | ICD-10-CM | POA: Diagnosis not present

## 2016-12-18 DIAGNOSIS — B961 Klebsiella pneumoniae [K. pneumoniae] as the cause of diseases classified elsewhere: Secondary | ICD-10-CM | POA: Diagnosis not present

## 2016-12-18 DIAGNOSIS — N390 Urinary tract infection, site not specified: Secondary | ICD-10-CM | POA: Diagnosis not present

## 2016-12-20 DIAGNOSIS — B961 Klebsiella pneumoniae [K. pneumoniae] as the cause of diseases classified elsewhere: Secondary | ICD-10-CM | POA: Diagnosis not present

## 2016-12-20 DIAGNOSIS — F0280 Dementia in other diseases classified elsewhere without behavioral disturbance: Secondary | ICD-10-CM | POA: Diagnosis not present

## 2016-12-20 DIAGNOSIS — N390 Urinary tract infection, site not specified: Secondary | ICD-10-CM | POA: Diagnosis not present

## 2016-12-20 DIAGNOSIS — J09X2 Influenza due to identified novel influenza A virus with other respiratory manifestations: Secondary | ICD-10-CM | POA: Diagnosis not present

## 2016-12-20 DIAGNOSIS — G309 Alzheimer's disease, unspecified: Secondary | ICD-10-CM | POA: Diagnosis not present

## 2016-12-20 DIAGNOSIS — Z86718 Personal history of other venous thrombosis and embolism: Secondary | ICD-10-CM | POA: Diagnosis not present

## 2016-12-23 DIAGNOSIS — J09X2 Influenza due to identified novel influenza A virus with other respiratory manifestations: Secondary | ICD-10-CM | POA: Diagnosis not present

## 2016-12-23 DIAGNOSIS — F0280 Dementia in other diseases classified elsewhere without behavioral disturbance: Secondary | ICD-10-CM | POA: Diagnosis not present

## 2016-12-23 DIAGNOSIS — B961 Klebsiella pneumoniae [K. pneumoniae] as the cause of diseases classified elsewhere: Secondary | ICD-10-CM | POA: Diagnosis not present

## 2016-12-23 DIAGNOSIS — Z86718 Personal history of other venous thrombosis and embolism: Secondary | ICD-10-CM | POA: Diagnosis not present

## 2016-12-23 DIAGNOSIS — N390 Urinary tract infection, site not specified: Secondary | ICD-10-CM | POA: Diagnosis not present

## 2016-12-23 DIAGNOSIS — G309 Alzheimer's disease, unspecified: Secondary | ICD-10-CM | POA: Diagnosis not present

## 2016-12-25 DIAGNOSIS — B961 Klebsiella pneumoniae [K. pneumoniae] as the cause of diseases classified elsewhere: Secondary | ICD-10-CM | POA: Diagnosis not present

## 2016-12-25 DIAGNOSIS — G309 Alzheimer's disease, unspecified: Secondary | ICD-10-CM | POA: Diagnosis not present

## 2016-12-25 DIAGNOSIS — J09X2 Influenza due to identified novel influenza A virus with other respiratory manifestations: Secondary | ICD-10-CM | POA: Diagnosis not present

## 2016-12-25 DIAGNOSIS — N390 Urinary tract infection, site not specified: Secondary | ICD-10-CM | POA: Diagnosis not present

## 2016-12-25 DIAGNOSIS — Z86718 Personal history of other venous thrombosis and embolism: Secondary | ICD-10-CM | POA: Diagnosis not present

## 2016-12-25 DIAGNOSIS — F0280 Dementia in other diseases classified elsewhere without behavioral disturbance: Secondary | ICD-10-CM | POA: Diagnosis not present

## 2016-12-26 DIAGNOSIS — N390 Urinary tract infection, site not specified: Secondary | ICD-10-CM | POA: Diagnosis not present

## 2016-12-26 DIAGNOSIS — B961 Klebsiella pneumoniae [K. pneumoniae] as the cause of diseases classified elsewhere: Secondary | ICD-10-CM | POA: Diagnosis not present

## 2016-12-26 DIAGNOSIS — J09X2 Influenza due to identified novel influenza A virus with other respiratory manifestations: Secondary | ICD-10-CM | POA: Diagnosis not present

## 2016-12-26 DIAGNOSIS — Z86718 Personal history of other venous thrombosis and embolism: Secondary | ICD-10-CM | POA: Diagnosis not present

## 2016-12-26 DIAGNOSIS — G309 Alzheimer's disease, unspecified: Secondary | ICD-10-CM | POA: Diagnosis not present

## 2016-12-26 DIAGNOSIS — F0280 Dementia in other diseases classified elsewhere without behavioral disturbance: Secondary | ICD-10-CM | POA: Diagnosis not present

## 2016-12-27 DIAGNOSIS — F0280 Dementia in other diseases classified elsewhere without behavioral disturbance: Secondary | ICD-10-CM | POA: Diagnosis not present

## 2016-12-27 DIAGNOSIS — B961 Klebsiella pneumoniae [K. pneumoniae] as the cause of diseases classified elsewhere: Secondary | ICD-10-CM | POA: Diagnosis not present

## 2016-12-27 DIAGNOSIS — G309 Alzheimer's disease, unspecified: Secondary | ICD-10-CM | POA: Diagnosis not present

## 2016-12-27 DIAGNOSIS — N390 Urinary tract infection, site not specified: Secondary | ICD-10-CM | POA: Diagnosis not present

## 2016-12-27 DIAGNOSIS — Z86718 Personal history of other venous thrombosis and embolism: Secondary | ICD-10-CM | POA: Diagnosis not present

## 2016-12-27 DIAGNOSIS — J09X2 Influenza due to identified novel influenza A virus with other respiratory manifestations: Secondary | ICD-10-CM | POA: Diagnosis not present

## 2016-12-30 DIAGNOSIS — B961 Klebsiella pneumoniae [K. pneumoniae] as the cause of diseases classified elsewhere: Secondary | ICD-10-CM | POA: Diagnosis not present

## 2016-12-30 DIAGNOSIS — N390 Urinary tract infection, site not specified: Secondary | ICD-10-CM | POA: Diagnosis not present

## 2016-12-30 DIAGNOSIS — G309 Alzheimer's disease, unspecified: Secondary | ICD-10-CM | POA: Diagnosis not present

## 2016-12-30 DIAGNOSIS — F0280 Dementia in other diseases classified elsewhere without behavioral disturbance: Secondary | ICD-10-CM | POA: Diagnosis not present

## 2016-12-30 DIAGNOSIS — J09X2 Influenza due to identified novel influenza A virus with other respiratory manifestations: Secondary | ICD-10-CM | POA: Diagnosis not present

## 2016-12-30 DIAGNOSIS — Z86718 Personal history of other venous thrombosis and embolism: Secondary | ICD-10-CM | POA: Diagnosis not present

## 2017-01-01 DIAGNOSIS — F0280 Dementia in other diseases classified elsewhere without behavioral disturbance: Secondary | ICD-10-CM | POA: Diagnosis not present

## 2017-01-01 DIAGNOSIS — G309 Alzheimer's disease, unspecified: Secondary | ICD-10-CM | POA: Diagnosis not present

## 2017-01-01 DIAGNOSIS — N390 Urinary tract infection, site not specified: Secondary | ICD-10-CM | POA: Diagnosis not present

## 2017-01-01 DIAGNOSIS — B961 Klebsiella pneumoniae [K. pneumoniae] as the cause of diseases classified elsewhere: Secondary | ICD-10-CM | POA: Diagnosis not present

## 2017-01-01 DIAGNOSIS — Z86718 Personal history of other venous thrombosis and embolism: Secondary | ICD-10-CM | POA: Diagnosis not present

## 2017-01-01 DIAGNOSIS — J09X2 Influenza due to identified novel influenza A virus with other respiratory manifestations: Secondary | ICD-10-CM | POA: Diagnosis not present

## 2017-01-06 DIAGNOSIS — J09X2 Influenza due to identified novel influenza A virus with other respiratory manifestations: Secondary | ICD-10-CM | POA: Diagnosis not present

## 2017-01-06 DIAGNOSIS — N390 Urinary tract infection, site not specified: Secondary | ICD-10-CM | POA: Diagnosis not present

## 2017-01-06 DIAGNOSIS — F0280 Dementia in other diseases classified elsewhere without behavioral disturbance: Secondary | ICD-10-CM | POA: Diagnosis not present

## 2017-01-06 DIAGNOSIS — Z86718 Personal history of other venous thrombosis and embolism: Secondary | ICD-10-CM | POA: Diagnosis not present

## 2017-01-06 DIAGNOSIS — G309 Alzheimer's disease, unspecified: Secondary | ICD-10-CM | POA: Diagnosis not present

## 2017-01-06 DIAGNOSIS — B961 Klebsiella pneumoniae [K. pneumoniae] as the cause of diseases classified elsewhere: Secondary | ICD-10-CM | POA: Diagnosis not present

## 2017-01-07 DIAGNOSIS — G309 Alzheimer's disease, unspecified: Secondary | ICD-10-CM | POA: Diagnosis not present

## 2017-01-07 DIAGNOSIS — Z86718 Personal history of other venous thrombosis and embolism: Secondary | ICD-10-CM | POA: Diagnosis not present

## 2017-01-07 DIAGNOSIS — J09X2 Influenza due to identified novel influenza A virus with other respiratory manifestations: Secondary | ICD-10-CM | POA: Diagnosis not present

## 2017-01-07 DIAGNOSIS — F0280 Dementia in other diseases classified elsewhere without behavioral disturbance: Secondary | ICD-10-CM | POA: Diagnosis not present

## 2017-01-07 DIAGNOSIS — B961 Klebsiella pneumoniae [K. pneumoniae] as the cause of diseases classified elsewhere: Secondary | ICD-10-CM | POA: Diagnosis not present

## 2017-01-07 DIAGNOSIS — N390 Urinary tract infection, site not specified: Secondary | ICD-10-CM | POA: Diagnosis not present

## 2017-01-08 DIAGNOSIS — N390 Urinary tract infection, site not specified: Secondary | ICD-10-CM | POA: Diagnosis not present

## 2017-01-08 DIAGNOSIS — Z86718 Personal history of other venous thrombosis and embolism: Secondary | ICD-10-CM | POA: Diagnosis not present

## 2017-01-08 DIAGNOSIS — B961 Klebsiella pneumoniae [K. pneumoniae] as the cause of diseases classified elsewhere: Secondary | ICD-10-CM | POA: Diagnosis not present

## 2017-01-08 DIAGNOSIS — J09X2 Influenza due to identified novel influenza A virus with other respiratory manifestations: Secondary | ICD-10-CM | POA: Diagnosis not present

## 2017-01-08 DIAGNOSIS — G309 Alzheimer's disease, unspecified: Secondary | ICD-10-CM | POA: Diagnosis not present

## 2017-01-08 DIAGNOSIS — F0280 Dementia in other diseases classified elsewhere without behavioral disturbance: Secondary | ICD-10-CM | POA: Diagnosis not present

## 2017-01-09 DIAGNOSIS — J09X2 Influenza due to identified novel influenza A virus with other respiratory manifestations: Secondary | ICD-10-CM | POA: Diagnosis not present

## 2017-01-09 DIAGNOSIS — B961 Klebsiella pneumoniae [K. pneumoniae] as the cause of diseases classified elsewhere: Secondary | ICD-10-CM | POA: Diagnosis not present

## 2017-01-09 DIAGNOSIS — G309 Alzheimer's disease, unspecified: Secondary | ICD-10-CM | POA: Diagnosis not present

## 2017-01-09 DIAGNOSIS — N390 Urinary tract infection, site not specified: Secondary | ICD-10-CM | POA: Diagnosis not present

## 2017-01-09 DIAGNOSIS — F0280 Dementia in other diseases classified elsewhere without behavioral disturbance: Secondary | ICD-10-CM | POA: Diagnosis not present

## 2017-01-09 DIAGNOSIS — Z86718 Personal history of other venous thrombosis and embolism: Secondary | ICD-10-CM | POA: Diagnosis not present

## 2017-01-10 DIAGNOSIS — F0280 Dementia in other diseases classified elsewhere without behavioral disturbance: Secondary | ICD-10-CM | POA: Diagnosis not present

## 2017-01-10 DIAGNOSIS — R350 Frequency of micturition: Secondary | ICD-10-CM | POA: Diagnosis not present

## 2017-01-10 DIAGNOSIS — G309 Alzheimer's disease, unspecified: Secondary | ICD-10-CM | POA: Diagnosis not present

## 2017-01-10 DIAGNOSIS — B961 Klebsiella pneumoniae [K. pneumoniae] as the cause of diseases classified elsewhere: Secondary | ICD-10-CM | POA: Diagnosis not present

## 2017-01-10 DIAGNOSIS — R300 Dysuria: Secondary | ICD-10-CM | POA: Diagnosis not present

## 2017-01-10 DIAGNOSIS — Z86718 Personal history of other venous thrombosis and embolism: Secondary | ICD-10-CM | POA: Diagnosis not present

## 2017-01-10 DIAGNOSIS — J09X2 Influenza due to identified novel influenza A virus with other respiratory manifestations: Secondary | ICD-10-CM | POA: Diagnosis not present

## 2017-01-10 DIAGNOSIS — N390 Urinary tract infection, site not specified: Secondary | ICD-10-CM | POA: Diagnosis not present

## 2017-01-13 DIAGNOSIS — B961 Klebsiella pneumoniae [K. pneumoniae] as the cause of diseases classified elsewhere: Secondary | ICD-10-CM | POA: Diagnosis not present

## 2017-01-13 DIAGNOSIS — Z86718 Personal history of other venous thrombosis and embolism: Secondary | ICD-10-CM | POA: Diagnosis not present

## 2017-01-13 DIAGNOSIS — G309 Alzheimer's disease, unspecified: Secondary | ICD-10-CM | POA: Diagnosis not present

## 2017-01-13 DIAGNOSIS — J09X2 Influenza due to identified novel influenza A virus with other respiratory manifestations: Secondary | ICD-10-CM | POA: Diagnosis not present

## 2017-01-13 DIAGNOSIS — F0280 Dementia in other diseases classified elsewhere without behavioral disturbance: Secondary | ICD-10-CM | POA: Diagnosis not present

## 2017-01-13 DIAGNOSIS — N390 Urinary tract infection, site not specified: Secondary | ICD-10-CM | POA: Diagnosis not present

## 2017-01-14 DIAGNOSIS — R9431 Abnormal electrocardiogram [ECG] [EKG]: Secondary | ICD-10-CM | POA: Diagnosis not present

## 2017-01-14 DIAGNOSIS — R001 Bradycardia, unspecified: Secondary | ICD-10-CM | POA: Diagnosis not present

## 2017-01-14 DIAGNOSIS — N390 Urinary tract infection, site not specified: Secondary | ICD-10-CM | POA: Diagnosis not present

## 2017-01-14 DIAGNOSIS — F0390 Unspecified dementia without behavioral disturbance: Secondary | ICD-10-CM | POA: Diagnosis not present

## 2017-01-14 DIAGNOSIS — R319 Hematuria, unspecified: Secondary | ICD-10-CM | POA: Diagnosis not present

## 2017-01-14 DIAGNOSIS — I10 Essential (primary) hypertension: Secondary | ICD-10-CM | POA: Diagnosis not present

## 2017-01-14 DIAGNOSIS — Z87891 Personal history of nicotine dependence: Secondary | ICD-10-CM | POA: Diagnosis not present

## 2017-01-16 DIAGNOSIS — J09X2 Influenza due to identified novel influenza A virus with other respiratory manifestations: Secondary | ICD-10-CM | POA: Diagnosis not present

## 2017-01-16 DIAGNOSIS — F0280 Dementia in other diseases classified elsewhere without behavioral disturbance: Secondary | ICD-10-CM | POA: Diagnosis not present

## 2017-01-16 DIAGNOSIS — Z86718 Personal history of other venous thrombosis and embolism: Secondary | ICD-10-CM | POA: Diagnosis not present

## 2017-01-16 DIAGNOSIS — B961 Klebsiella pneumoniae [K. pneumoniae] as the cause of diseases classified elsewhere: Secondary | ICD-10-CM | POA: Diagnosis not present

## 2017-01-16 DIAGNOSIS — N390 Urinary tract infection, site not specified: Secondary | ICD-10-CM | POA: Diagnosis not present

## 2017-01-16 DIAGNOSIS — G309 Alzheimer's disease, unspecified: Secondary | ICD-10-CM | POA: Diagnosis not present

## 2017-01-17 DIAGNOSIS — N390 Urinary tract infection, site not specified: Secondary | ICD-10-CM | POA: Diagnosis not present

## 2017-01-17 DIAGNOSIS — G309 Alzheimer's disease, unspecified: Secondary | ICD-10-CM | POA: Diagnosis not present

## 2017-01-17 DIAGNOSIS — B961 Klebsiella pneumoniae [K. pneumoniae] as the cause of diseases classified elsewhere: Secondary | ICD-10-CM | POA: Diagnosis not present

## 2017-01-17 DIAGNOSIS — F0280 Dementia in other diseases classified elsewhere without behavioral disturbance: Secondary | ICD-10-CM | POA: Diagnosis not present

## 2017-01-17 DIAGNOSIS — Z86718 Personal history of other venous thrombosis and embolism: Secondary | ICD-10-CM | POA: Diagnosis not present

## 2017-01-17 DIAGNOSIS — J09X2 Influenza due to identified novel influenza A virus with other respiratory manifestations: Secondary | ICD-10-CM | POA: Diagnosis not present

## 2017-01-20 DIAGNOSIS — F0280 Dementia in other diseases classified elsewhere without behavioral disturbance: Secondary | ICD-10-CM | POA: Diagnosis not present

## 2017-01-20 DIAGNOSIS — Z86718 Personal history of other venous thrombosis and embolism: Secondary | ICD-10-CM | POA: Diagnosis not present

## 2017-01-20 DIAGNOSIS — Z8781 Personal history of (healed) traumatic fracture: Secondary | ICD-10-CM | POA: Diagnosis not present

## 2017-01-20 DIAGNOSIS — G309 Alzheimer's disease, unspecified: Secondary | ICD-10-CM | POA: Diagnosis not present

## 2017-01-20 DIAGNOSIS — N390 Urinary tract infection, site not specified: Secondary | ICD-10-CM | POA: Diagnosis not present

## 2017-01-20 DIAGNOSIS — B9689 Other specified bacterial agents as the cause of diseases classified elsewhere: Secondary | ICD-10-CM | POA: Diagnosis not present

## 2017-01-20 DIAGNOSIS — Z87891 Personal history of nicotine dependence: Secondary | ICD-10-CM | POA: Diagnosis not present

## 2017-01-22 DIAGNOSIS — F0280 Dementia in other diseases classified elsewhere without behavioral disturbance: Secondary | ICD-10-CM | POA: Diagnosis not present

## 2017-01-22 DIAGNOSIS — Z87891 Personal history of nicotine dependence: Secondary | ICD-10-CM | POA: Diagnosis not present

## 2017-01-22 DIAGNOSIS — N390 Urinary tract infection, site not specified: Secondary | ICD-10-CM | POA: Diagnosis not present

## 2017-01-22 DIAGNOSIS — G309 Alzheimer's disease, unspecified: Secondary | ICD-10-CM | POA: Diagnosis not present

## 2017-01-22 DIAGNOSIS — Z86718 Personal history of other venous thrombosis and embolism: Secondary | ICD-10-CM | POA: Diagnosis not present

## 2017-01-22 DIAGNOSIS — B9689 Other specified bacterial agents as the cause of diseases classified elsewhere: Secondary | ICD-10-CM | POA: Diagnosis not present

## 2017-01-24 DIAGNOSIS — F0280 Dementia in other diseases classified elsewhere without behavioral disturbance: Secondary | ICD-10-CM | POA: Diagnosis not present

## 2017-01-24 DIAGNOSIS — Z86718 Personal history of other venous thrombosis and embolism: Secondary | ICD-10-CM | POA: Diagnosis not present

## 2017-01-24 DIAGNOSIS — B9689 Other specified bacterial agents as the cause of diseases classified elsewhere: Secondary | ICD-10-CM | POA: Diagnosis not present

## 2017-01-24 DIAGNOSIS — N390 Urinary tract infection, site not specified: Secondary | ICD-10-CM | POA: Diagnosis not present

## 2017-01-24 DIAGNOSIS — Z87891 Personal history of nicotine dependence: Secondary | ICD-10-CM | POA: Diagnosis not present

## 2017-01-24 DIAGNOSIS — G309 Alzheimer's disease, unspecified: Secondary | ICD-10-CM | POA: Diagnosis not present

## 2017-01-30 DIAGNOSIS — Z87891 Personal history of nicotine dependence: Secondary | ICD-10-CM | POA: Diagnosis not present

## 2017-01-30 DIAGNOSIS — G309 Alzheimer's disease, unspecified: Secondary | ICD-10-CM | POA: Diagnosis not present

## 2017-01-30 DIAGNOSIS — F0280 Dementia in other diseases classified elsewhere without behavioral disturbance: Secondary | ICD-10-CM | POA: Diagnosis not present

## 2017-01-30 DIAGNOSIS — N390 Urinary tract infection, site not specified: Secondary | ICD-10-CM | POA: Diagnosis not present

## 2017-01-30 DIAGNOSIS — B9689 Other specified bacterial agents as the cause of diseases classified elsewhere: Secondary | ICD-10-CM | POA: Diagnosis not present

## 2017-01-30 DIAGNOSIS — Z86718 Personal history of other venous thrombosis and embolism: Secondary | ICD-10-CM | POA: Diagnosis not present

## 2017-02-04 DIAGNOSIS — Z86718 Personal history of other venous thrombosis and embolism: Secondary | ICD-10-CM | POA: Diagnosis not present

## 2017-02-04 DIAGNOSIS — F0280 Dementia in other diseases classified elsewhere without behavioral disturbance: Secondary | ICD-10-CM | POA: Diagnosis not present

## 2017-02-04 DIAGNOSIS — N390 Urinary tract infection, site not specified: Secondary | ICD-10-CM | POA: Diagnosis not present

## 2017-02-04 DIAGNOSIS — Z87891 Personal history of nicotine dependence: Secondary | ICD-10-CM | POA: Diagnosis not present

## 2017-02-04 DIAGNOSIS — G309 Alzheimer's disease, unspecified: Secondary | ICD-10-CM | POA: Diagnosis not present

## 2017-02-04 DIAGNOSIS — B9689 Other specified bacterial agents as the cause of diseases classified elsewhere: Secondary | ICD-10-CM | POA: Diagnosis not present

## 2017-02-20 DIAGNOSIS — Z87891 Personal history of nicotine dependence: Secondary | ICD-10-CM | POA: Diagnosis not present

## 2017-02-20 DIAGNOSIS — G309 Alzheimer's disease, unspecified: Secondary | ICD-10-CM | POA: Diagnosis not present

## 2017-02-20 DIAGNOSIS — B9689 Other specified bacterial agents as the cause of diseases classified elsewhere: Secondary | ICD-10-CM | POA: Diagnosis not present

## 2017-02-20 DIAGNOSIS — N390 Urinary tract infection, site not specified: Secondary | ICD-10-CM | POA: Diagnosis not present

## 2017-02-20 DIAGNOSIS — Z86718 Personal history of other venous thrombosis and embolism: Secondary | ICD-10-CM | POA: Diagnosis not present

## 2017-02-20 DIAGNOSIS — F0280 Dementia in other diseases classified elsewhere without behavioral disturbance: Secondary | ICD-10-CM | POA: Diagnosis not present

## 2017-03-17 DIAGNOSIS — K645 Perianal venous thrombosis: Secondary | ICD-10-CM | POA: Diagnosis not present

## 2017-03-17 DIAGNOSIS — F0151 Vascular dementia with behavioral disturbance: Secondary | ICD-10-CM | POA: Diagnosis not present

## 2017-03-17 DIAGNOSIS — Z0000 Encounter for general adult medical examination without abnormal findings: Secondary | ICD-10-CM | POA: Diagnosis not present

## 2017-03-17 DIAGNOSIS — F321 Major depressive disorder, single episode, moderate: Secondary | ICD-10-CM | POA: Diagnosis not present

## 2017-03-17 DIAGNOSIS — Z1389 Encounter for screening for other disorder: Secondary | ICD-10-CM | POA: Diagnosis not present

## 2018-01-13 DIAGNOSIS — N2889 Other specified disorders of kidney and ureter: Secondary | ICD-10-CM | POA: Diagnosis not present

## 2018-01-13 DIAGNOSIS — K8070 Calculus of gallbladder and bile duct without cholecystitis without obstruction: Secondary | ICD-10-CM | POA: Diagnosis not present

## 2018-01-13 DIAGNOSIS — E440 Moderate protein-calorie malnutrition: Secondary | ICD-10-CM | POA: Diagnosis not present

## 2018-01-13 DIAGNOSIS — G309 Alzheimer's disease, unspecified: Secondary | ICD-10-CM | POA: Diagnosis not present

## 2018-01-13 DIAGNOSIS — R112 Nausea with vomiting, unspecified: Secondary | ICD-10-CM | POA: Diagnosis not present

## 2018-01-13 DIAGNOSIS — K8020 Calculus of gallbladder without cholecystitis without obstruction: Secondary | ICD-10-CM | POA: Diagnosis not present

## 2018-01-13 DIAGNOSIS — R109 Unspecified abdominal pain: Secondary | ICD-10-CM | POA: Diagnosis not present

## 2018-01-13 DIAGNOSIS — N289 Disorder of kidney and ureter, unspecified: Secondary | ICD-10-CM | POA: Diagnosis not present

## 2018-01-13 DIAGNOSIS — R55 Syncope and collapse: Secondary | ICD-10-CM | POA: Diagnosis not present

## 2018-01-13 DIAGNOSIS — R404 Transient alteration of awareness: Secondary | ICD-10-CM | POA: Diagnosis not present

## 2018-01-13 DIAGNOSIS — N200 Calculus of kidney: Secondary | ICD-10-CM | POA: Diagnosis not present

## 2018-01-13 DIAGNOSIS — N179 Acute kidney failure, unspecified: Secondary | ICD-10-CM | POA: Diagnosis not present

## 2018-01-13 DIAGNOSIS — G308 Other Alzheimer's disease: Secondary | ICD-10-CM | POA: Diagnosis not present

## 2018-01-14 DIAGNOSIS — K8050 Calculus of bile duct without cholangitis or cholecystitis without obstruction: Secondary | ICD-10-CM | POA: Diagnosis not present

## 2018-01-14 DIAGNOSIS — K8020 Calculus of gallbladder without cholecystitis without obstruction: Secondary | ICD-10-CM | POA: Diagnosis not present

## 2018-01-15 ENCOUNTER — Ambulatory Visit (HOSPITAL_COMMUNITY): Payer: Medicare Other | Admitting: Anesthesiology

## 2018-01-15 ENCOUNTER — Encounter (HOSPITAL_COMMUNITY): Payer: Self-pay

## 2018-01-15 ENCOUNTER — Ambulatory Visit (HOSPITAL_COMMUNITY): Payer: Medicare Other

## 2018-01-15 ENCOUNTER — Encounter (HOSPITAL_COMMUNITY)
Admission: AD | Disposition: A | Discharge status: Other Acute Inpatient Hospital | Payer: Self-pay | Source: Ambulatory Visit | Attending: Internal Medicine

## 2018-01-15 ENCOUNTER — Ambulatory Visit (HOSPITAL_COMMUNITY)
Admission: AD | Admit: 2018-01-15 | Discharge: 2018-01-15 | Disposition: A | Discharge status: Other Acute Inpatient Hospital | Payer: Medicare Other | Source: Ambulatory Visit | Attending: Internal Medicine | Admitting: Internal Medicine

## 2018-01-15 DIAGNOSIS — E46 Unspecified protein-calorie malnutrition: Secondary | ICD-10-CM | POA: Insufficient documentation

## 2018-01-15 DIAGNOSIS — Z880 Allergy status to penicillin: Secondary | ICD-10-CM | POA: Insufficient documentation

## 2018-01-15 DIAGNOSIS — R55 Syncope and collapse: Secondary | ICD-10-CM | POA: Diagnosis not present

## 2018-01-15 DIAGNOSIS — Z681 Body mass index (BMI) 19 or less, adult: Secondary | ICD-10-CM | POA: Insufficient documentation

## 2018-01-15 DIAGNOSIS — N289 Disorder of kidney and ureter, unspecified: Secondary | ICD-10-CM | POA: Diagnosis present

## 2018-01-15 DIAGNOSIS — I82409 Acute embolism and thrombosis of unspecified deep veins of unspecified lower extremity: Secondary | ICD-10-CM | POA: Insufficient documentation

## 2018-01-15 DIAGNOSIS — N183 Chronic kidney disease, stage 3 (moderate): Secondary | ICD-10-CM | POA: Diagnosis present

## 2018-01-15 DIAGNOSIS — N2889 Other specified disorders of kidney and ureter: Secondary | ICD-10-CM | POA: Diagnosis not present

## 2018-01-15 DIAGNOSIS — Z9889 Other specified postprocedural states: Secondary | ICD-10-CM

## 2018-01-15 DIAGNOSIS — Z743 Need for continuous supervision: Secondary | ICD-10-CM | POA: Diagnosis not present

## 2018-01-15 DIAGNOSIS — K8010 Calculus of gallbladder with chronic cholecystitis without obstruction: Secondary | ICD-10-CM | POA: Diagnosis not present

## 2018-01-15 DIAGNOSIS — M6281 Muscle weakness (generalized): Secondary | ICD-10-CM | POA: Diagnosis not present

## 2018-01-15 DIAGNOSIS — R1310 Dysphagia, unspecified: Secondary | ICD-10-CM | POA: Diagnosis not present

## 2018-01-15 DIAGNOSIS — K8050 Calculus of bile duct without cholangitis or cholecystitis without obstruction: Secondary | ICD-10-CM | POA: Diagnosis not present

## 2018-01-15 DIAGNOSIS — Z48815 Encounter for surgical aftercare following surgery on the digestive system: Secondary | ICD-10-CM | POA: Diagnosis not present

## 2018-01-15 DIAGNOSIS — Z87891 Personal history of nicotine dependence: Secondary | ICD-10-CM | POA: Diagnosis not present

## 2018-01-15 DIAGNOSIS — F0280 Dementia in other diseases classified elsewhere without behavioral disturbance: Secondary | ICD-10-CM | POA: Diagnosis present

## 2018-01-15 DIAGNOSIS — Z7901 Long term (current) use of anticoagulants: Secondary | ICD-10-CM | POA: Diagnosis not present

## 2018-01-15 DIAGNOSIS — R41841 Cognitive communication deficit: Secondary | ICD-10-CM | POA: Diagnosis not present

## 2018-01-15 DIAGNOSIS — R2681 Unsteadiness on feet: Secondary | ICD-10-CM | POA: Diagnosis not present

## 2018-01-15 DIAGNOSIS — K2950 Unspecified chronic gastritis without bleeding: Secondary | ICD-10-CM | POA: Diagnosis not present

## 2018-01-15 DIAGNOSIS — G309 Alzheimer's disease, unspecified: Secondary | ICD-10-CM | POA: Diagnosis not present

## 2018-01-15 DIAGNOSIS — K8070 Calculus of gallbladder and bile duct without cholecystitis without obstruction: Secondary | ICD-10-CM | POA: Diagnosis not present

## 2018-01-15 DIAGNOSIS — Z86718 Personal history of other venous thrombosis and embolism: Secondary | ICD-10-CM | POA: Diagnosis not present

## 2018-01-15 DIAGNOSIS — Z6820 Body mass index (BMI) 20.0-20.9, adult: Secondary | ICD-10-CM | POA: Diagnosis not present

## 2018-01-15 DIAGNOSIS — R001 Bradycardia, unspecified: Secondary | ICD-10-CM | POA: Diagnosis present

## 2018-01-15 DIAGNOSIS — E440 Moderate protein-calorie malnutrition: Secondary | ICD-10-CM | POA: Diagnosis present

## 2018-01-15 DIAGNOSIS — I129 Hypertensive chronic kidney disease with stage 1 through stage 4 chronic kidney disease, or unspecified chronic kidney disease: Secondary | ICD-10-CM | POA: Diagnosis present

## 2018-01-15 DIAGNOSIS — Z79899 Other long term (current) drug therapy: Secondary | ICD-10-CM | POA: Insufficient documentation

## 2018-01-15 DIAGNOSIS — K8020 Calculus of gallbladder without cholecystitis without obstruction: Secondary | ICD-10-CM | POA: Diagnosis not present

## 2018-01-15 DIAGNOSIS — R498 Other voice and resonance disorders: Secondary | ICD-10-CM | POA: Diagnosis not present

## 2018-01-15 DIAGNOSIS — K838 Other specified diseases of biliary tract: Secondary | ICD-10-CM | POA: Diagnosis not present

## 2018-01-15 DIAGNOSIS — F05 Delirium due to known physiological condition: Secondary | ICD-10-CM | POA: Diagnosis not present

## 2018-01-15 DIAGNOSIS — K8043 Calculus of bile duct with acute cholecystitis with obstruction: Secondary | ICD-10-CM | POA: Diagnosis not present

## 2018-01-15 DIAGNOSIS — N200 Calculus of kidney: Secondary | ICD-10-CM | POA: Diagnosis not present

## 2018-01-15 DIAGNOSIS — R279 Unspecified lack of coordination: Secondary | ICD-10-CM | POA: Diagnosis not present

## 2018-01-15 DIAGNOSIS — Z7401 Bed confinement status: Secondary | ICD-10-CM | POA: Diagnosis not present

## 2018-01-15 DIAGNOSIS — G308 Other Alzheimer's disease: Secondary | ICD-10-CM | POA: Diagnosis not present

## 2018-01-15 DIAGNOSIS — F0390 Unspecified dementia without behavioral disturbance: Secondary | ICD-10-CM | POA: Diagnosis not present

## 2018-01-15 HISTORY — PX: BALLOON DILATION: SHX5330

## 2018-01-15 HISTORY — DX: Unspecified protein-calorie malnutrition: E46

## 2018-01-15 HISTORY — DX: Alzheimer's disease, unspecified: G30.9

## 2018-01-15 HISTORY — DX: Dementia in other diseases classified elsewhere, unspecified severity, without behavioral disturbance, psychotic disturbance, mood disturbance, and anxiety: F02.80

## 2018-01-15 HISTORY — PX: ERCP: SHX5425

## 2018-01-15 HISTORY — DX: Acute embolism and thrombosis of unspecified deep veins of unspecified lower extremity: I82.409

## 2018-01-15 HISTORY — PX: REMOVAL OF STONES: SHX5545

## 2018-01-15 HISTORY — PX: SPHINCTEROTOMY: SHX5544

## 2018-01-15 SURGERY — ERCP, WITH INTERVENTION IF INDICATED
Anesthesia: General | Site: Esophagus | Wound class: Clean Contaminated

## 2018-01-15 MED ORDER — EPHEDRINE SULFATE 50 MG/ML IJ SOLN
INTRAMUSCULAR | Status: AC
  Filled 2018-01-15: qty 1

## 2018-01-15 MED ORDER — SUCCINYLCHOLINE CHLORIDE 20 MG/ML IJ SOLN
INTRAMUSCULAR | Status: AC
  Filled 2018-01-15: qty 1

## 2018-01-15 MED ORDER — SODIUM CHLORIDE 0.9% FLUSH
INTRAVENOUS | Status: AC
  Filled 2018-01-15: qty 30

## 2018-01-15 MED ORDER — SIMETHICONE 40 MG/0.6ML PO SUSP
ORAL | Status: AC
  Filled 2018-01-15: qty 1.2

## 2018-01-15 MED ORDER — PROPOFOL 10 MG/ML IV BOLUS
INTRAVENOUS | Status: DC | PRN
  Administered 2018-01-15: 90 mg via INTRAVENOUS

## 2018-01-15 MED ORDER — ATROPINE SULFATE 0.4 MG/ML IJ SOLN
INTRAMUSCULAR | Status: AC
  Filled 2018-01-15: qty 2

## 2018-01-15 MED ORDER — SODIUM CHLORIDE 0.9 % IJ SOLN
INTRAMUSCULAR | Status: AC
  Filled 2018-01-15: qty 10

## 2018-01-15 MED ORDER — PROPOFOL 10 MG/ML IV BOLUS
INTRAVENOUS | Status: AC
  Filled 2018-01-15: qty 20

## 2018-01-15 MED ORDER — LACTATED RINGERS IV SOLN
INTRAVENOUS | Status: DC
  Administered 2018-01-15: 13:00:00 via INTRAVENOUS

## 2018-01-15 MED ORDER — GLYCOPYRROLATE 0.2 MG/ML IJ SOLN
INTRAMUSCULAR | Status: AC
Start: 2018-01-15 — End: 2018-01-15
  Filled 2018-01-15: qty 1

## 2018-01-15 MED ORDER — ROCURONIUM BROMIDE 50 MG/5ML IV SOLN
INTRAVENOUS | Status: AC
  Filled 2018-01-15: qty 1

## 2018-01-15 MED ORDER — STERILE WATER FOR IRRIGATION IR SOLN
Status: DC | PRN
  Administered 2018-01-15: 1000 mL

## 2018-01-15 MED ORDER — EPHEDRINE SULFATE 50 MG/ML IJ SOLN
INTRAMUSCULAR | Status: DC | PRN
  Administered 2018-01-15: 10 mg via INTRAVENOUS

## 2018-01-15 MED ORDER — GLUCAGON HCL RDNA (DIAGNOSTIC) 1 MG IJ SOLR
INTRAMUSCULAR | Status: AC
  Filled 2018-01-15: qty 2

## 2018-01-15 MED ORDER — GLYCOPYRROLATE 0.2 MG/ML IJ SOLN
INTRAMUSCULAR | Status: DC | PRN
  Administered 2018-01-15: 0.2 mg via INTRAVENOUS

## 2018-01-15 MED ORDER — GLYCOPYRROLATE 0.2 MG/ML IJ SOLN
INTRAMUSCULAR | Status: AC
  Filled 2018-01-15: qty 5

## 2018-01-15 MED ORDER — LIDOCAINE VISCOUS 2 % MT SOLN
OROMUCOSAL | Status: AC
  Filled 2018-01-15: qty 15

## 2018-01-15 MED ORDER — SUCCINYLCHOLINE CHLORIDE 20 MG/ML IJ SOLN
INTRAMUSCULAR | Status: DC | PRN
  Administered 2018-01-15: 100 mg via INTRAVENOUS

## 2018-01-15 MED ORDER — SUGAMMADEX SODIUM 200 MG/2ML IV SOLN
INTRAVENOUS | Status: AC
  Filled 2018-01-15: qty 2

## 2018-01-15 MED ORDER — IOPAMIDOL (ISOVUE-M 300) INJECTION 61%
INTRAMUSCULAR | Status: DC | PRN
  Administered 2018-01-15: 50 mL

## 2018-01-15 MED ORDER — IOPAMIDOL (ISOVUE-300) INJECTION 61%
INTRAVENOUS | Status: AC
  Filled 2018-01-15: qty 100

## 2018-01-15 MED ORDER — GLUCAGON HCL RDNA (DIAGNOSTIC) 1 MG IJ SOLR
INTRAMUSCULAR | Status: DC | PRN
  Administered 2018-01-15 (×4): 0.25 mg via INTRAVENOUS

## 2018-01-15 NOTE — Addendum Note (Signed)
Addendum  created 01/15/18 1735 by Franco NonesYates, Niyonna Betsill S, CRNA   Sign clinical note

## 2018-01-15 NOTE — H&P (Signed)
Robert Hurst is an 84 y.o. male.   Chief Complaint: Patient is here for ERCP sphincterotomy and stone extraction HPI: Patient is 84 year old Caucasian male with history of dementia was [facility] following a syncopal episode at home.  He had multiple studies including abdominopelvic CT.  He was found to have gallstone as well as single stone in common bile duct.  His transaminases have been normal.  He has been referred for therapeutic ERCP.  Patient denies abdominal pain.  According to his son Robert Hurst he has not had any episodes of abdominal pain nausea or vomiting.  He is stays at home.  His son stays with him at night and during the daytime 1 of his nephew stays with him.  When he is feeling well he is ambulatory and able to take a bath and do simple tasks. He does not take any aspirin or other anticoagulants.  Past Medical History:  Diagnosis Date  . Alzheimer disease   . DVT (deep venous thrombosis) (HCC)   . Malnourished [facility](HCC)     Past Surgical History:  Procedure Laterality Date  . APPENDECTOMY    . TONSILLECTOMY      History reviewed. No pertinent family history. Social History:  reports that he drank alcohol. He reports that he has current or past drug history. His tobacco history is not on file.  Allergies:  Allergies  Allergen Reactions  . Penicillins     Medications Prior to Admission  Medication Sig Dispense Refill  . desmopressin (DDAVP) 0.2 MG tablet Take 0.2 mg by mouth daily.    Marland Kitchen. donepezil (ARICEPT) 10 MG tablet Take 10 mg by mouth at bedtime.    . memantine (NAMENDA) 10 MG tablet Take 10 mg by mouth 2 (two) times daily.    Marland Kitchen. OLANZapine (ZYPREXA) 2.5 MG tablet Take 2.5 mg by mouth at bedtime.    . rivaroxaban (XARELTO) 20 MG TABS tablet Take 20 mg by mouth daily with supper.      No results found for this or any previous visit (from the past 48 hour(s)). No results found.  ROS  Blood pressure (!) 144/74, resp. rate 13, height 5\' 5"   (1.651 m), weight 115 lb (52.2 kg), SpO2 97 %. Physical Exam  Constitutional: He appears well-developed and well-nourished.  HENT:  Mouth/Throat: Oropharynx is clear and moist.  Eyes: Conjunctivae are normal. No scleral icterus.  Neck: No thyromegaly present.  Cardiovascular: Normal rate and normal heart sounds.  No murmur heard. Respiratory: Effort normal and breath sounds normal.  GI: Soft. He exhibits no distension and no mass. There is no tenderness.  Musculoskeletal: He exhibits no edema.  Lymphadenopathy:    He has no cervical adenopathy.  Neurological: He is alert.  Skin: Skin is warm and dry.     Assessment/Plan  Choledocholithiasis.  Patient also has cholelithiasis.  Patient does not have any symptoms of cholangitis.  He is at risk for cholangitis and biliary pancreatitis.  Therefore ERCP with sphincterotomy and stone removal would be appropriate. I have discussed procedure risks with son Robert Hurst who is at bedside.  He is agreeable to proceed with the procedure. [facility]  Lionel DecemberNajeeb Shena Vinluan, MD 01/15/2018, 1:57 PM

## 2018-01-15 NOTE — Discharge Instructions (Signed)
Endoscopic Retrograde Cholangiopancreatogram, Care After This sheet gives you information about how to care for yourself after your procedure. Your health care provider may also give you more specific instructions. If you have problems or questions, contact your health care provider. What can I expect after the procedure? After the procedure, it is common to have:  Soreness in your throat.  Nausea.  Bloating.  Dizziness.  Tiredness (fatigue).  Follow these instructions at home:  Take over-the-counter and prescription medicines only as told by your health care provider.  Do not drive for 24 hours if you were given a medicine to help you relax (sedative) during your procedure. Have someone stay with you for 24 hours after the procedure.  Return to your normal activities as told by your health care provider. Ask your health care provider what activities are safe for you.  Return to eating what you normally do as soon as you feel well enough or as told by your health care provider.  Keep all follow-up visits as told by your health care provider. This is important. Contact a health care provider if:  You have pain in your abdomen that does not get better with medicine.  You develop signs of infection, such as: ? Chills. ? Feeling unwell. Get help right away if:  You have difficulty swallowing.  You have worsening pain in your throat, chest, or abdomen.  You vomit bright red blood or a substance that looks like coffee grounds.  You have bloody or very black stools.  You have a fever.  You have a sudden increase in swelling (bloating) in your abdomen. Summary  After the procedure, it is common to feel tired and to have some discomfort in your throat.  Contact your health care provider if you have signs of infection--such as chills or feeling unwell--or if you have pain that does not improve with medicine.  Get help right away if you have trouble swallowing, worsening  pain, bloody or black vomit, bloody or black stools, a fever, or increased swelling in your abdomen.  Keep all follow-up visits as told by your health care provider. This is important. This information is not intended to replace advice given to you by your health care provider. Make sure you discuss any questions you have with your health care provider. Document Released: 06/30/2013 Document Revised: 07/29/2016 Document Reviewed: 07/29/2016 Elsevier Interactive Patient Education  2017 [employer]   [facility]. No aspirin NSAIDs or anticoagulants for 72 hours. Clear liquids today.  Can advance diet to heart healthy in a.m. Check LFTs and serum amylase in a.m. Please also check H. pylori serology as patient has diffuse gastritis. Call if you have any questions(Dr. Rehman's pager #[phone])

## 2018-01-15 NOTE — Anesthesia Postprocedure Evaluation (Signed)
Anesthesia Post Note  Patient: Cindi CarbonJoe Durrett  Procedure(s) Performed: ENDOSCOPIC RETROGRADE CHOLANGIOPANCREATOGRAPHY (ERCP) (N/A Esophagus) REMOVAL OF STONES (N/A Esophagus) SPHINCTEROTOMY (N/A Esophagus) BALLOON DILATION (N/A Esophagus)  Patient location during evaluation: PACU Anesthesia Type: General Level of consciousness: awake and alert and patient cooperative Pain management: satisfactory to patient Vital Signs Assessment: post-procedure vital signs reviewed and stable Respiratory status: spontaneous breathing Cardiovascular status: stable Postop Assessment: no apparent nausea or vomiting Anesthetic complications: no     Last Vitals:  Vitals:   01/15/18 1700 01/15/18 1715  BP: (!) 72/52 105/65  Pulse: 64 (!) 58  Resp: 19 14  Temp:    SpO2: 95% 98%    Last Pain:  Vitals:   01/15/18 1715  PainSc: 0-No pain                 Rushi Chasen

## 2018-01-15 NOTE — Progress Notes (Signed)
ERCP findings.:   Patient has diffuse gastritis. Normal ampulla water. Dilated CBD CHD. Maximal diameter about 10 mm. Single large stone in CBD. Biliary sphincterotomy performed. Sphincterotomy dilated to 10 mm with balloon. Stone was broken up into pieces with Dormia basket/mechanical lithotripsy. All fragments removed with Dormia basket in stone balloon extracted.

## 2018-01-15 NOTE — Anesthesia Preprocedure Evaluation (Signed)
Anesthesia Evaluation  Patient identified by MRN, date of birth, ID band Patient awake    Reviewed: Allergy & Precautions, H&P , NPO status , Patient's Chart, lab work & pertinent test results, reviewed documented beta blocker date and time   Airway Mallampati: II  TM Distance: >3 FB Neck ROM: full   Comment: One lower front RT tooth remains Dental no notable dental hx. (+) Missing   Pulmonary neg pulmonary ROS,    Pulmonary exam normal breath sounds clear to auscultation       Cardiovascular Exercise Tolerance: Good negative cardio ROS   Rhythm:regular Rate:Normal     Neuro/Psych Dementia negative neurological ROS  negative psych ROS   GI/Hepatic negative GI ROS, Neg liver ROS,   Endo/Other  negative endocrine ROS  Renal/GU CRFRenal diseaseH/O kidney stones  negative genitourinary   Musculoskeletal   Abdominal   Peds  Hematology negative hematology ROS (+)   Anesthesia Other Findings Gallstones DVT malnutrition   Reproductive/Obstetrics negative OB ROS                             Anesthesia Physical Anesthesia Plan  ASA: III  Anesthesia Plan: General   Post-op Pain Management:    Induction:   PONV Risk Score and Plan:   Airway Management Planned:   Additional Equipment:   Intra-op Plan:   Post-operative Plan:   Informed Consent: I have reviewed the patients History and Physical, chart, labs and discussed the procedure including the risks, benefits and alternatives for the proposed anesthesia with the patient or authorized representative who has indicated his/her understanding and acceptance.   Dental Advisory Given  Plan Discussed with: CRNA  Anesthesia Plan Comments:         Anesthesia Quick Evaluation

## 2018-01-15 NOTE — Anesthesia Procedure Notes (Signed)
Procedure Name: Intubation Date/Time: 01/15/2018 2:14 PM Performed by: Vista Deck, CRNA Pre-anesthesia Checklist: Patient identified, Patient being monitored, Timeout performed, Emergency Drugs available and Suction available Patient Re-evaluated:Patient Re-evaluated prior to induction Oxygen Delivery Method: Circle System Utilized Preoxygenation: Pre-oxygenation with 100% oxygen Induction Type: IV induction Ventilation: Mask ventilation without difficulty Laryngoscope Size: Mac and 3 Grade View: Grade I Tube type: Oral Tube size: 7.0 mm Number of attempts: 1 Airway Equipment and Method: stylet Placement Confirmation: ETT inserted through vocal cords under direct vision,  positive ETCO2 and breath sounds checked- equal and bilateral Secured at: 22 cm Tube secured with: Tape Dental Injury: Teeth and Oropharynx as per pre-operative assessment

## 2018-01-15 NOTE — Transfer of Care (Signed)
Immediate Anesthesia Transfer of Care Note  Patient: Robert Hurst  Procedure(s) Performed: ENDOSCOPIC RETROGRADE CHOLANGIOPANCREATOGRAPHY (ERCP) (N/A Esophagus) REMOVAL OF STONES (N/A Esophagus) SPHINCTEROTOMY (N/A Esophagus) BALLOON DILATION (N/A Esophagus)  Patient Location: PACU  Anesthesia Type:General  Level of Consciousness: awake and patient cooperative  Airway & Oxygen Therapy: Patient Spontanous Breathing and non-rebreather face mask  Post-op Assessment: Report given to RN and Post -op Vital signs reviewed and stable  Post vital signs: Reviewed and stable  Last Vitals:  Vitals Value Taken Time  BP    Temp    Pulse 86 01/15/2018  3:22 PM  Resp    SpO2 100 % 01/15/2018  3:22 PM  Vitals shown include unvalidated device data.  Last Pain:  Vitals:   01/15/18 1225  PainSc: 0-No pain         Complications: No apparent anesthesia complications

## 2018-01-15 NOTE — Op Note (Addendum)
[facility] Patient Name: Robert Hurst Procedure Date: 01/15/2018 12:46 PM MRN: [id number] Date of Birth: [birth year] Attending MD: Lionel December , MD CSN: [id number] Age: 84 Admit Type: Outpatient Procedure:                ERCP Indications:              For therapy of bile duct stone(s) Providers:                Lionel December, MD, Loma Messing B. Patsy Lager, RN, Dyann Ruddle Referring MD:             Flossie Dibble, MD Medicines:                General Anesthesia Complications:            No immediate complications. Estimated Blood Loss:     Estimated blood loss was minimal. Procedure:                Pre-Anesthesia Assessment:                           - Prior to the procedure, a History and Physical                            was performed, and patient medications and                            allergies were reviewed. The patient's tolerance of                            previous anesthesia was also reviewed. The risks                            and benefits of the procedure and the sedation                            options and risks were discussed with the patient.                            All questions were answered, and informed consent                            was obtained. Prior Anticoagulants: The patient has                            taken no previous anticoagulant or antiplatelet                            agents. ASA Grade Assessment: III - A patient with                            severe systemic disease. After reviewing the risks  and benefits, the patient was deemed in                            satisfactory condition to undergo the procedure.                           After obtaining informed consent, the scope was                            passed under direct vision. Throughout the                            procedure, the patient's blood pressure, pulse, and                            oxygen saturations were  monitored continuously. The                            [id number] ([id number]) scope was introduced through                            the mouth, and used to inject contrast into and                            used to cannulate the bile duct. The ERCP was                            somewhat difficult due to {skip}large calcified                            stone. The patient tolerated the procedure well. Scope In: 2:29:16 PM Scope Out: 3:06:59 PM Total Procedure Duration: 0 hours 37 minutes 43 seconds  Findings:      The scout film was normal. The esophagus was successfully intubated       under direct vision. The scope was advanced to a normal major papilla in       the descending duodenum without detailed examination of the pharynx,       larynx and associated structures, and upper GI tract. The upper GI tract       was grossly normal except mild diffuse gastritis. The bile duct was       deeply cannulated with the Autotome sphincterotome. Contrast was       injected. I personally interpreted the bile duct images. Ductal flow of       contrast was adequate. Image quality was excellent. Contrast extended to       the entire biliary tree. The common bile duct and common hepatic duct       were moderately dilated and diffusely dilated, acquired. The common bile       duct contained one stone, which was 10 mm in diameter. An 8 mm biliary       sphincterotomy was made with a braided Autotome sphincterotome using       ERBE electrocautery. There was no post-sphincterotomy bleeding. Dilation       of the common bile duct with an 05-01-09 mm balloon (to a maximum balloon  size of 10 mm) dilator was successful. Lithotripsy with the mechanical       lithotripter was successful for stone fragmentation and removal. The       biliary tree was swept with a basket starting at the bifurcation. Sludge       was swept from the duct. All stones were removed. Impression:               - The common bile duct  and common hepatic duct were                            moderately dilated, acquired.                           - Choledocholithiasis was found. Single large stone                            noted.                           - A biliary sphincterotomy was performed.                            Sphincterotomy dilated to 10 mm with CRE balloon.                           - Common bile duct was successfully dilated.                           - Lithotripsy was successful for stone                            fragmentation and removal.                           - Complete removal was accomplished by biliary                            sphincterotomy and basket extraction.                           - The biliary tree was swept with basket and                            balloon.                           Comment; Mild diffuse gastritis possibly due to                            H.Pylori infection. Moderate Sedation:      Per Anesthesia Care Recommendation:           - Return patient to referring hospital for ongoing                            care.                           -  Avoid aspirin and nonsteroidal anti-inflammatory                            medicines for 3 days.                           - Clear liquid diet today.                           - Low sodium diet tomorrow.                           - Continue present medications.                           - H. Pylori serology. Procedure Code(s):        --- Professional ---                           [phone], Endoscopic retrograde                            cholangiopancreatography (ERCP); with destruction                            of calculi, any method (eg, mechanical,                            electrohydraulic, lithotripsy) Diagnosis Code(s):        --- Professional ---                           K80.50, Calculus of bile duct without cholangitis                            or cholecystitis without obstruction                           K83.8,  Other specified diseases of biliary tract CPT copyright 2017 American Medical Association. All rights reserved. The codes documented in this report are preliminary and upon coder review may  be revised to meet current compliance requirements. Lionel December, MD Lionel December, MD 01/15/2018 3:30:28 PM This report has been signed electronically. Number of Addenda: 0

## 2018-01-15 NOTE — Anesthesia Postprocedure Evaluation (Signed)
Anesthesia Post Note  Patient: Robert Hurst  Procedure(s) Performed: ENDOSCOPIC RETROGRADE CHOLANGIOPANCREATOGRAPHY (ERCP) (N/A Esophagus) REMOVAL OF STONES (N/A Esophagus) SPHINCTEROTOMY (N/A Esophagus) BALLOON DILATION (N/A Esophagus)  Patient location during evaluation: PACU Anesthesia Type: General Level of consciousness: awake and alert, oriented and patient cooperative Pain management: pain level controlled Vital Signs Assessment: post-procedure vital signs reviewed and stable Respiratory status: spontaneous breathing and respiratory function stable Cardiovascular status: stable Postop Assessment: no apparent nausea or vomiting Anesthetic complications: no     Last Vitals:  Vitals:   01/15/18 1538 01/15/18 1545  BP:    Pulse: 65 71  Resp: 18 15  Temp:    SpO2: 96% 94%    Last Pain:  Vitals:   01/15/18 1522  PainSc: Asleep                 Rahel Carlton A

## 2018-01-21 ENCOUNTER — Encounter (HOSPITAL_COMMUNITY): Payer: Self-pay | Admitting: Internal Medicine

## 2018-01-21 DIAGNOSIS — G479 Sleep disorder, unspecified: Secondary | ICD-10-CM | POA: Diagnosis not present

## 2018-01-21 DIAGNOSIS — H1130 Conjunctival hemorrhage, unspecified eye: Secondary | ICD-10-CM | POA: Diagnosis not present

## 2018-01-21 DIAGNOSIS — R1915 Other abnormal bowel sounds: Secondary | ICD-10-CM | POA: Diagnosis not present

## 2018-01-21 DIAGNOSIS — K8020 Calculus of gallbladder without cholecystitis without obstruction: Secondary | ICD-10-CM | POA: Diagnosis not present

## 2018-01-21 DIAGNOSIS — F0280 Dementia in other diseases classified elsewhere without behavioral disturbance: Secondary | ICD-10-CM | POA: Diagnosis not present

## 2018-01-21 DIAGNOSIS — R498 Other voice and resonance disorders: Secondary | ICD-10-CM | POA: Diagnosis not present

## 2018-01-21 DIAGNOSIS — Z86718 Personal history of other venous thrombosis and embolism: Secondary | ICD-10-CM | POA: Diagnosis not present

## 2018-01-21 DIAGNOSIS — R69 Illness, unspecified: Secondary | ICD-10-CM | POA: Diagnosis not present

## 2018-01-21 DIAGNOSIS — I517 Cardiomegaly: Secondary | ICD-10-CM | POA: Diagnosis not present

## 2018-01-21 DIAGNOSIS — Z48815 Encounter for surgical aftercare following surgery on the digestive system: Secondary | ICD-10-CM | POA: Diagnosis not present

## 2018-01-21 DIAGNOSIS — N400 Enlarged prostate without lower urinary tract symptoms: Secondary | ICD-10-CM | POA: Diagnosis not present

## 2018-01-21 DIAGNOSIS — I82409 Acute embolism and thrombosis of unspecified deep veins of unspecified lower extremity: Secondary | ICD-10-CM | POA: Diagnosis not present

## 2018-01-21 DIAGNOSIS — R198 Other specified symptoms and signs involving the digestive system and abdomen: Secondary | ICD-10-CM | POA: Diagnosis not present

## 2018-01-21 DIAGNOSIS — G309 Alzheimer's disease, unspecified: Secondary | ICD-10-CM | POA: Diagnosis not present

## 2018-01-21 DIAGNOSIS — R1310 Dysphagia, unspecified: Secondary | ICD-10-CM | POA: Diagnosis not present

## 2018-01-21 DIAGNOSIS — R2681 Unsteadiness on feet: Secondary | ICD-10-CM | POA: Diagnosis not present

## 2018-01-21 DIAGNOSIS — Z7901 Long term (current) use of anticoagulants: Secondary | ICD-10-CM | POA: Diagnosis not present

## 2018-01-21 DIAGNOSIS — K8043 Calculus of bile duct with acute cholecystitis with obstruction: Secondary | ICD-10-CM | POA: Diagnosis not present

## 2018-01-21 DIAGNOSIS — M6281 Muscle weakness (generalized): Secondary | ICD-10-CM | POA: Diagnosis not present

## 2018-01-21 DIAGNOSIS — E440 Moderate protein-calorie malnutrition: Secondary | ICD-10-CM | POA: Diagnosis not present

## 2018-01-21 DIAGNOSIS — Z79899 Other long term (current) drug therapy: Secondary | ICD-10-CM | POA: Diagnosis not present

## 2018-01-21 DIAGNOSIS — D7289 Other specified disorders of white blood cells: Secondary | ICD-10-CM | POA: Diagnosis not present

## 2018-01-21 DIAGNOSIS — R400 Somnolence: Secondary | ICD-10-CM | POA: Diagnosis not present

## 2018-01-21 DIAGNOSIS — Z7401 Bed confinement status: Secondary | ICD-10-CM | POA: Diagnosis not present

## 2018-01-21 DIAGNOSIS — K5900 Constipation, unspecified: Secondary | ICD-10-CM | POA: Diagnosis not present

## 2018-01-21 DIAGNOSIS — N200 Calculus of kidney: Secondary | ICD-10-CM | POA: Diagnosis not present

## 2018-01-21 DIAGNOSIS — R4182 Altered mental status, unspecified: Secondary | ICD-10-CM | POA: Diagnosis not present

## 2018-01-21 DIAGNOSIS — R41841 Cognitive communication deficit: Secondary | ICD-10-CM | POA: Diagnosis not present

## 2018-01-21 DIAGNOSIS — N2889 Other specified disorders of kidney and ureter: Secondary | ICD-10-CM | POA: Diagnosis not present

## 2018-01-21 DIAGNOSIS — R55 Syncope and collapse: Secondary | ICD-10-CM | POA: Diagnosis not present

## 2018-01-21 DIAGNOSIS — G9341 Metabolic encephalopathy: Secondary | ICD-10-CM | POA: Diagnosis not present

## 2018-01-21 DIAGNOSIS — D72829 Elevated white blood cell count, unspecified: Secondary | ICD-10-CM | POA: Diagnosis not present

## 2018-01-21 DIAGNOSIS — K567 Ileus, unspecified: Secondary | ICD-10-CM | POA: Diagnosis not present

## 2018-01-21 DIAGNOSIS — K8070 Calculus of gallbladder and bile duct without cholecystitis without obstruction: Secondary | ICD-10-CM | POA: Diagnosis not present

## 2018-01-21 DIAGNOSIS — R279 Unspecified lack of coordination: Secondary | ICD-10-CM | POA: Diagnosis not present

## 2018-01-21 DIAGNOSIS — G308 Other Alzheimer's disease: Secondary | ICD-10-CM | POA: Diagnosis not present

## 2018-01-21 DIAGNOSIS — R10817 Generalized abdominal tenderness: Secondary | ICD-10-CM | POA: Diagnosis not present

## 2018-01-21 DIAGNOSIS — E876 Hypokalemia: Secondary | ICD-10-CM | POA: Diagnosis not present

## 2018-01-26 DIAGNOSIS — G308 Other Alzheimer's disease: Secondary | ICD-10-CM | POA: Diagnosis not present

## 2018-01-26 DIAGNOSIS — Z86718 Personal history of other venous thrombosis and embolism: Secondary | ICD-10-CM | POA: Diagnosis not present

## 2018-01-26 DIAGNOSIS — G479 Sleep disorder, unspecified: Secondary | ICD-10-CM | POA: Diagnosis not present

## 2018-01-26 DIAGNOSIS — F0280 Dementia in other diseases classified elsewhere without behavioral disturbance: Secondary | ICD-10-CM | POA: Diagnosis not present

## 2018-01-27 DIAGNOSIS — G308 Other Alzheimer's disease: Secondary | ICD-10-CM | POA: Diagnosis not present

## 2018-01-27 DIAGNOSIS — Z86718 Personal history of other venous thrombosis and embolism: Secondary | ICD-10-CM | POA: Diagnosis not present

## 2018-01-27 DIAGNOSIS — F0280 Dementia in other diseases classified elsewhere without behavioral disturbance: Secondary | ICD-10-CM | POA: Diagnosis not present

## 2018-01-28 DIAGNOSIS — F0280 Dementia in other diseases classified elsewhere without behavioral disturbance: Secondary | ICD-10-CM | POA: Diagnosis not present

## 2018-01-28 DIAGNOSIS — E876 Hypokalemia: Secondary | ICD-10-CM | POA: Diagnosis not present

## 2018-01-29 DIAGNOSIS — F0280 Dementia in other diseases classified elsewhere without behavioral disturbance: Secondary | ICD-10-CM | POA: Diagnosis not present

## 2018-01-29 DIAGNOSIS — K8043 Calculus of bile duct with acute cholecystitis with obstruction: Secondary | ICD-10-CM | POA: Diagnosis not present

## 2018-02-02 DIAGNOSIS — R400 Somnolence: Secondary | ICD-10-CM | POA: Diagnosis not present

## 2018-02-04 DIAGNOSIS — D72829 Elevated white blood cell count, unspecified: Secondary | ICD-10-CM | POA: Diagnosis not present

## 2018-02-04 DIAGNOSIS — R4182 Altered mental status, unspecified: Secondary | ICD-10-CM | POA: Diagnosis not present

## 2018-02-05 DIAGNOSIS — D72829 Elevated white blood cell count, unspecified: Secondary | ICD-10-CM | POA: Diagnosis not present

## 2018-02-09 DIAGNOSIS — R400 Somnolence: Secondary | ICD-10-CM | POA: Diagnosis not present

## 2018-02-09 DIAGNOSIS — D72829 Elevated white blood cell count, unspecified: Secondary | ICD-10-CM | POA: Diagnosis not present

## 2018-02-11 ENCOUNTER — Other Ambulatory Visit: Payer: Self-pay

## 2018-02-11 ENCOUNTER — Emergency Department (HOSPITAL_COMMUNITY): Payer: Medicare Other

## 2018-02-11 ENCOUNTER — Emergency Department (HOSPITAL_COMMUNITY)
Admission: EM | Admit: 2018-02-11 | Discharge: 2018-02-12 | Disposition: A | Discharge status: Home / Self Care | Payer: Medicare Other | Attending: Emergency Medicine | Admitting: Emergency Medicine

## 2018-02-11 ENCOUNTER — Encounter (HOSPITAL_COMMUNITY): Payer: Self-pay

## 2018-02-11 DIAGNOSIS — Z7901 Long term (current) use of anticoagulants: Secondary | ICD-10-CM | POA: Insufficient documentation

## 2018-02-11 DIAGNOSIS — G309 Alzheimer's disease, unspecified: Secondary | ICD-10-CM | POA: Diagnosis not present

## 2018-02-11 DIAGNOSIS — Z79899 Other long term (current) drug therapy: Secondary | ICD-10-CM | POA: Insufficient documentation

## 2018-02-11 DIAGNOSIS — K5900 Constipation, unspecified: Secondary | ICD-10-CM | POA: Diagnosis not present

## 2018-02-11 DIAGNOSIS — N400 Enlarged prostate without lower urinary tract symptoms: Secondary | ICD-10-CM | POA: Diagnosis not present

## 2018-02-11 DIAGNOSIS — R1915 Other abnormal bowel sounds: Secondary | ICD-10-CM | POA: Diagnosis not present

## 2018-02-11 DIAGNOSIS — K8020 Calculus of gallbladder without cholecystitis without obstruction: Secondary | ICD-10-CM | POA: Diagnosis not present

## 2018-02-11 LAB — CBC WITH DIFFERENTIAL/PLATELET
BASOS ABS: 0 10*3/uL (ref 0.0–0.1)
Basophils Relative: 0 %
EOS PCT: 4 %
Eosinophils Absolute: 0.3 10*3/uL (ref 0.0–0.7)
HEMATOCRIT: 40.8 % (ref 39.0–52.0)
Hemoglobin: 14 g/dL (ref 13.0–17.0)
LYMPHS PCT: 22 %
Lymphs Abs: 1.3 10*3/uL (ref 0.7–4.0)
MCH: 30.2 pg (ref 26.0–34.0)
MCHC: 34.3 g/dL (ref 30.0–36.0)
MCV: 87.9 fL (ref 78.0–100.0)
MONOS PCT: 8 %
Monocytes Absolute: 0.5 10*3/uL (ref 0.1–1.0)
NEUTROS ABS: 4 10*3/uL (ref 1.7–7.7)
Neutrophils Relative %: 66 %
PLATELETS: 280 10*3/uL (ref 150–400)
RBC: 4.64 MIL/uL (ref 4.22–5.81)
RDW: 14.3 % (ref 11.5–15.5)
WBC: 6 10*3/uL (ref 4.0–10.5)

## 2018-02-11 LAB — COMPREHENSIVE METABOLIC PANEL
ALK PHOS: 82 U/L (ref 38–126)
ALT: 10 U/L — AB (ref 17–63)
ANION GAP: 10 (ref 5–15)
AST: 21 U/L (ref 15–41)
Albumin: 2.8 g/dL — ABNORMAL LOW (ref 3.5–5.0)
BILIRUBIN TOTAL: 0.6 mg/dL (ref 0.3–1.2)
BUN: 16 mg/dL (ref 6–20)
CALCIUM: 9.1 mg/dL (ref 8.9–10.3)
CO2: 29 mmol/L (ref 22–32)
CREATININE: 1.33 mg/dL — AB (ref 0.61–1.24)
Chloride: 100 mmol/L — ABNORMAL LOW (ref 101–111)
GFR, EST AFRICAN AMERICAN: 55 mL/min — AB (ref >60)
GFR, EST NON AFRICAN AMERICAN: 48 mL/min — AB (ref >60)
Glucose, Bld: 114 mg/dL — ABNORMAL HIGH (ref 65–99)
Potassium: 3.4 mmol/L — ABNORMAL LOW (ref 3.5–5.1)
Sodium: 139 mmol/L (ref 135–145)
TOTAL PROTEIN: 8.4 g/dL — AB (ref 6.5–8.1)

## 2018-02-11 LAB — LIPASE, BLOOD: Lipase: 53 U/L — ABNORMAL HIGH (ref 11–51)

## 2018-02-11 MED ORDER — SODIUM CHLORIDE 0.9 % IV BOLUS
1000.0000 mL | Freq: Once | INTRAVENOUS | Status: AC
  Administered 2018-02-11: 1000 mL via INTRAVENOUS

## 2018-02-11 MED ORDER — IOHEXOL 300 MG/ML  SOLN
75.0000 mL | Freq: Once | INTRAMUSCULAR | Status: AC | PRN
  Administered 2018-02-11: 75 mL via INTRAVENOUS

## 2018-02-11 NOTE — ED Provider Notes (Signed)
[facility] EMERGENCY DEPARTMENT Provider Note   CSN: [id number] Arrival date & time: 02/11/18  2043     History   Chief Complaint Chief Complaint  Patient presents with  . Constipation    illeus, abnormal KUB    HPI Robert Hurst is a 84 y.o. male.  Level 5 caveat secondary to dementia.  Patient transferred here from [facility] nursing home for evaluation of abdominal distention.  Patient unable to provide any history.  I do see reports that he had an ERCP late April.  He will nod yes and no but I am not sure that he fully understands the questions I am asking him.  He did not know if and how long when asked about abdominal pain.  Per EMS nursing handoff the patient has been having some abdominal distention and on x-ray had an ileus with retained contrast in the colon.  Reportedly they did a rectal exam and found some mucus drainage but no stool in the vault.  It is not reported whether he had any fever or vomiting.  The history is provided by the patient and the EMS personnel. The history is limited by the condition of the patient.    Past Medical History:  Diagnosis Date  . Alzheimer disease   . DVT (deep venous thrombosis) (HCC)   . Malnourished (HCC)     There are no active problems to display for this patient.   Past Surgical History:  Procedure Laterality Date  . APPENDECTOMY    . BALLOON DILATION N/A 01/15/2018   Procedure: BALLOON DILATION;  Surgeon: Malissa Hippo, MD;  [facility];  Service: Endoscopy;  Laterality: N/A;  . ERCP N/A 01/15/2018   Procedure: ENDOSCOPIC RETROGRADE CHOLANGIOPANCREATOGRAPHY (ERCP);  Surgeon: Malissa Hippo, MD;  Location: [facility];  Service: Endoscopy;  Laterality: N/A;  . REMOVAL OF STONES N/A 01/15/2018   Procedure: REMOVAL OF STONES;  Surgeon: Malissa Hippo, MD;  Location: [facility];  Service: Endoscopy;  Laterality: N/A;  . SPHINCTEROTOMY N/A 01/15/2018   Procedure: SPHINCTEROTOMY;  Surgeon: Malissa Hippo, MD;   Location: [facility];  Service: Endoscopy;  Laterality: N/A;  . TONSILLECTOMY          Home Medications    Prior to Admission medications   Medication Sig Start Date End Date Taking? Authorizing Provider  desmopressin (DDAVP) 0.2 MG tablet Take 0.2 mg by mouth daily.    [provider]  donepezil (ARICEPT) 10 MG tablet Take 10 mg by mouth at bedtime.    [provider]  memantine (NAMENDA) 10 MG tablet Take 10 mg by mouth 2 (two) times daily.    [provider]  OLANZapine (ZYPREXA) 2.5 MG tablet Take 2.5 mg by mouth at bedtime.    [provider]  rivaroxaban (XARELTO) 20 MG TABS tablet Take 20 mg by mouth daily with supper.    [provider]    Family History No family history on file.  Social History Social History   Tobacco Use  . Smoking status: Unknown If Ever Smoked  Substance Use Topics  . Alcohol use: Not Currently  . Drug use: Not Currently     Allergies   Penicillins   Review of Systems Review of Systems  Unable to perform ROS: Dementia     Physical Exam Updated Vital Signs BP 129/73   Pulse (!) 51   Temp 97.8 F (36.6 C) (Oral)   Resp 17   Wt 54.4 kg (  120 lb)   SpO2 100%   BMI 19.97 kg/m   Physical Exam  Constitutional: He appears well-developed and well-nourished.  HENT:  Head: Normocephalic and atraumatic.  Eyes: Conjunctivae are normal.  Neck: Neck supple.  Cardiovascular: Normal rate and regular rhythm.  No murmur heard. Pulmonary/Chest: Effort normal and breath sounds normal. No respiratory distress.  Abdominal: Soft. He exhibits distension. He exhibits no mass. There is no tenderness. There is no rigidity, no rebound and no guarding.  Patient has what appear to be fresh laparoscopic incision sites with some Dermabond still flaking off.  He has a JP drain in his right upper quadrant.  He is got no tenderness to exam but is mildly distended and tympanitic.  Musculoskeletal: He exhibits no  edema.  Neurological: He is alert. GCS eye subscore is 4. GCS verbal subscore is 5. GCS motor subscore is 6.  He has some contractures of his lower extremities and not verbalizing anything to questions.  He will nod.  Skin: Skin is warm and dry. Capillary refill takes less than 2 seconds.  Psychiatric: He has a normal mood and affect.  Nursing note and vitals reviewed.    ED Treatments / Results  Labs (all labs ordered are listed, but only abnormal results are displayed) Labs Reviewed  COMPREHENSIVE METABOLIC PANEL - Abnormal; Notable for the following components:      Result Value   Potassium 3.4 (*)    Chloride 100 (*)    Glucose, Bld 114 (*)    Creatinine, Ser 1.33 (*)    Total Protein 8.4 (*)    Albumin 2.8 (*)    ALT 10 (*)    GFR calc non Af Amer 48 (*)    GFR calc Af Amer 55 (*)    All other components within normal limits  LIPASE, BLOOD - Abnormal; Notable for the following components:   Lipase 53 (*)    All other components within normal limits  CBC WITH DIFFERENTIAL/PLATELET  URINALYSIS, ROUTINE W REFLEX MICROSCOPIC    EKG None  Radiology Ct Abdomen Pelvis W Contrast  Result Date: 02/11/2018 CLINICAL DATA:  Abdominal distention EXAM: CT ABDOMEN AND PELVIS WITH CONTRAST TECHNIQUE: Multidetector CT imaging of the abdomen and pelvis was performed using the standard protocol following bolus administration of intravenous contrast. CONTRAST:  75mL OMNIPAQUE IOHEXOL 300 MG/ML  SOLN COMPARISON:  None. FINDINGS: Lower chest: Mildly enlarged cardiac silhouette. Atelectasis noted at the lung bases. No effusion or pneumothorax. Hepatobiliary: Pneumobilia status post recent intervention. Status post cholecystectomy. A percutaneous drain entering from right lower quadrant is noted with tip medial and inferior to the gallbladder fossa. No space-occupying mass of the liver. Pancreas: Mild pancreatic atrophy without mass or ductal dilatation. Spleen: Normal spleen. Adrenals/Urinary  Tract: Tiny hypodense nodule in the left adrenal apex measuring 5 mm too small to further characterize but likely to represent a tiny adenoma. Bilateral renal cysts are identified with nonobstructing bilateral renal calculi similar in appearance to prior. The calculus in the upper pole the right kidney staghorn in appearance measuring up to 2.2 cm. The calculus in the left kidney measures up to 0.9 cm. Left lower pole renal calculus also noted measuring up to 1 cm. No hydroureteronephrosis. Thick-walled urinary bladder likely due to underdistention given relative normal appearance on recent comparison. Small left-sided bladder diverticula. Stomach/Bowel: Increased colonic stool burden. Gastric distention with ingested fluid and food. No small bowel dilatation or obstruction. Normal appendix. Vascular/Lymphatic: Mild aortoiliac atherosclerosis. No lymphadenopathy. Reproductive: Enlarged prostate measuring  up to 5.7 cm transverse with central and peripheral zone calcifications. Other: No free air nor free fluid. Postop change at the level of the umbilicus. Musculoskeletal: No acute osseous abnormality. Degenerative change of the lumbar spine. Bone islands of the left iliac. IMPRESSION: Status post cholecystectomy with adjacent drain in place. Pneumobilia from recent instrumentation for choledocholithiasis. Bilateral renal cysts and renal calculi as before. Small left-sided bladder diverticulum. Relatively thickened anterior bladder wall some which is likely due to underdistention. Increased colonic stool burden. No bowel obstruction or inflammation. Enlarged prostate. Electronically Signed   By: Tollie Eth M.D.   On: 02/11/2018 23:44    Procedures Procedures (including critical care time)  Medications Ordered in ED Medications  sodium chloride 0.9 % bolus 1,000 mL (has no administration in time range)     Initial Impression / Assessment and Plan / ED Course  I have reviewed the triage vital signs and the  nursing notes.  Pertinent labs & imaging results that were available during my care of the patient were reviewed by me and considered in my medical decision making (see chart for details).  Clinical Course as of Feb 13 48  Wed Feb 11, 2018  2101 Patient newly to nursing home nonverbal DNR here with abdominal distention in the setting of probable recent lap chole  still with a JP drain.  The documentation from the nursing facility was very limited and why the patient was here other than he had an x-ray that showed an ileus with some retained contrast.  Patient himself cannot tell me anything.  I put him in for some basic labs and a CT and will try to reach family.   [MB]  2119 attempted to reach daughter Earon Rivest at the number listed on the nursing home next of kin sheet [phone].  There was no answer and the message so that the voice box has not been set up.   [MB]  2357 CT with moderate stool burden and no evidence of obstruction.  His labs are otherwise unremarkable.  I feel the patient can be returned back to the nursing facility where they can continue to work on a bowel regimen form.     [MB]    Clinical Course User Index [MB] Terrilee Files, MD     Final Clinical Impressions(s) / ED Diagnoses   Final diagnoses:  Constipation, unspecified constipation type    ED Discharge Orders    None       Terrilee Files, MD 02/12/18 [phone]

## 2018-02-11 NOTE — ED Notes (Addendum)
Tried to in and out patient there was the urine return. Patient has a condom cath on now.

## 2018-02-11 NOTE — ED Triage Notes (Signed)
Robert Hurst, nursing staff at [facility] reports pt had KUB today that showed an ileus with contrast still in colon. Reports constipation with no reports of pain. Last BM unknown. Daughter notified by facility staff per report. Staff also reports a mucous like drainage from rectum last night after performing rectal to check for impacted stool. Pt alert and oriented to name only- Hx of Alzheimer's

## 2018-02-11 NOTE — Discharge Instructions (Addendum)
Robert Hurst was evaluated in the emergency department for concern for constipation versus possible abdominal infection.  His lab work was unremarkable and his CAT scan demonstrated some increased stool burden.  We are returning him to the nursing facility to have them continue with a bowel regiment.  Please return to the emergency department if any worsening symptoms.  CT: IMPRESSION:  Status post cholecystectomy with adjacent drain in place.  Pneumobilia from recent instrumentation for choledocholithiasis.     Bilateral renal cysts and renal calculi as before. Small left-sided  bladder diverticulum. Relatively thickened anterior bladder wall  some which is likely due to underdistention.     Increased colonic stool burden. No bowel obstruction or  inflammation.     Enlarged prostate.

## 2018-02-12 DIAGNOSIS — K5900 Constipation, unspecified: Secondary | ICD-10-CM | POA: Diagnosis not present

## 2018-02-12 NOTE — ED Notes (Signed)
Attempt to call report to [facility], no answer, [facility] called for transport at this time

## 2018-02-12 NOTE — ED Notes (Signed)
[facility] at bedside to transfer pt.

## 2018-02-12 NOTE — ED Notes (Signed)
Report called to [facility] at Nursing Facility.

## 2018-02-17 DIAGNOSIS — F0280 Dementia in other diseases classified elsewhere without behavioral disturbance: Secondary | ICD-10-CM | POA: Diagnosis not present

## 2018-02-17 DIAGNOSIS — I82409 Acute embolism and thrombosis of unspecified deep veins of unspecified lower extremity: Secondary | ICD-10-CM | POA: Diagnosis not present

## 2018-02-17 DIAGNOSIS — G9341 Metabolic encephalopathy: Secondary | ICD-10-CM | POA: Diagnosis not present

## 2018-02-19 DIAGNOSIS — R198 Other specified symptoms and signs involving the digestive system and abdomen: Secondary | ICD-10-CM | POA: Diagnosis not present

## 2018-02-23 DIAGNOSIS — R400 Somnolence: Secondary | ICD-10-CM | POA: Diagnosis not present

## 2018-02-23 DIAGNOSIS — K5900 Constipation, unspecified: Secondary | ICD-10-CM | POA: Diagnosis not present

## 2018-02-26 DIAGNOSIS — F0280 Dementia in other diseases classified elsewhere without behavioral disturbance: Secondary | ICD-10-CM | POA: Diagnosis not present

## 2018-02-26 DIAGNOSIS — I82409 Acute embolism and thrombosis of unspecified deep veins of unspecified lower extremity: Secondary | ICD-10-CM | POA: Diagnosis not present

## 2018-02-26 DIAGNOSIS — K5900 Constipation, unspecified: Secondary | ICD-10-CM | POA: Diagnosis not present

## 2018-03-05 DIAGNOSIS — H1130 Conjunctival hemorrhage, unspecified eye: Secondary | ICD-10-CM | POA: Diagnosis not present

## 2018-04-06 DIAGNOSIS — K5900 Constipation, unspecified: Secondary | ICD-10-CM | POA: Diagnosis not present

## 2018-04-06 DIAGNOSIS — F0280 Dementia in other diseases classified elsewhere without behavioral disturbance: Secondary | ICD-10-CM | POA: Diagnosis not present

## 2018-04-06 DIAGNOSIS — Z86718 Personal history of other venous thrombosis and embolism: Secondary | ICD-10-CM | POA: Diagnosis not present

## 2018-04-17 DIAGNOSIS — R319 Hematuria, unspecified: Secondary | ICD-10-CM | POA: Diagnosis not present

## 2018-04-17 DIAGNOSIS — D72829 Elevated white blood cell count, unspecified: Secondary | ICD-10-CM | POA: Diagnosis not present

## 2018-04-17 DIAGNOSIS — N390 Urinary tract infection, site not specified: Secondary | ICD-10-CM | POA: Diagnosis not present

## 2018-04-20 DIAGNOSIS — R319 Hematuria, unspecified: Secondary | ICD-10-CM | POA: Diagnosis not present

## 2018-04-21 DIAGNOSIS — N200 Calculus of kidney: Secondary | ICD-10-CM | POA: Diagnosis not present

## 2018-04-21 DIAGNOSIS — R55 Syncope and collapse: Secondary | ICD-10-CM | POA: Diagnosis not present

## 2018-04-21 DIAGNOSIS — Z86718 Personal history of other venous thrombosis and embolism: Secondary | ICD-10-CM | POA: Diagnosis not present

## 2018-04-21 DIAGNOSIS — D649 Anemia, unspecified: Secondary | ICD-10-CM | POA: Diagnosis not present

## 2018-04-21 DIAGNOSIS — F0280 Dementia in other diseases classified elsewhere without behavioral disturbance: Secondary | ICD-10-CM | POA: Diagnosis not present

## 2018-04-22 DIAGNOSIS — R319 Hematuria, unspecified: Secondary | ICD-10-CM | POA: Diagnosis not present

## 2018-05-07 DIAGNOSIS — Z5181 Encounter for therapeutic drug level monitoring: Secondary | ICD-10-CM | POA: Diagnosis not present

## 2018-05-07 DIAGNOSIS — D649 Anemia, unspecified: Secondary | ICD-10-CM | POA: Diagnosis not present

## 2018-05-07 DIAGNOSIS — I10 Essential (primary) hypertension: Secondary | ICD-10-CM | POA: Diagnosis not present

## 2018-05-07 DIAGNOSIS — I4891 Unspecified atrial fibrillation: Secondary | ICD-10-CM | POA: Diagnosis not present

## 2018-05-26 DIAGNOSIS — F0280 Dementia in other diseases classified elsewhere without behavioral disturbance: Secondary | ICD-10-CM | POA: Diagnosis not present

## 2018-05-26 DIAGNOSIS — R55 Syncope and collapse: Secondary | ICD-10-CM | POA: Diagnosis not present

## 2018-05-26 DIAGNOSIS — Z86718 Personal history of other venous thrombosis and embolism: Secondary | ICD-10-CM | POA: Diagnosis not present

## 2018-06-16 DIAGNOSIS — I82409 Acute embolism and thrombosis of unspecified deep veins of unspecified lower extremity: Secondary | ICD-10-CM | POA: Diagnosis not present

## 2018-06-16 DIAGNOSIS — F0280 Dementia in other diseases classified elsewhere without behavioral disturbance: Secondary | ICD-10-CM | POA: Diagnosis not present

## 2018-06-16 DIAGNOSIS — K8043 Calculus of bile duct with acute cholecystitis with obstruction: Secondary | ICD-10-CM | POA: Diagnosis not present

## 2018-07-06 DIAGNOSIS — Z7901 Long term (current) use of anticoagulants: Secondary | ICD-10-CM | POA: Diagnosis not present

## 2018-07-06 DIAGNOSIS — N183 Chronic kidney disease, stage 3 (moderate): Secondary | ICD-10-CM | POA: Diagnosis not present

## 2018-07-06 DIAGNOSIS — D649 Anemia, unspecified: Secondary | ICD-10-CM | POA: Diagnosis not present

## 2018-07-06 DIAGNOSIS — Z86718 Personal history of other venous thrombosis and embolism: Secondary | ICD-10-CM | POA: Diagnosis not present

## 2018-07-09 DIAGNOSIS — Z79899 Other long term (current) drug therapy: Secondary | ICD-10-CM | POA: Diagnosis not present

## 2018-07-09 DIAGNOSIS — D649 Anemia, unspecified: Secondary | ICD-10-CM | POA: Diagnosis not present

## 2018-07-11 DIAGNOSIS — D649 Anemia, unspecified: Secondary | ICD-10-CM | POA: Diagnosis not present

## 2018-07-11 DIAGNOSIS — E440 Moderate protein-calorie malnutrition: Secondary | ICD-10-CM | POA: Diagnosis not present

## 2018-07-11 DIAGNOSIS — I10 Essential (primary) hypertension: Secondary | ICD-10-CM | POA: Diagnosis not present

## 2018-07-11 DIAGNOSIS — Z79899 Other long term (current) drug therapy: Secondary | ICD-10-CM | POA: Diagnosis not present

## 2018-07-30 DIAGNOSIS — Z79899 Other long term (current) drug therapy: Secondary | ICD-10-CM | POA: Diagnosis not present

## 2018-08-03 DIAGNOSIS — N183 Chronic kidney disease, stage 3 (moderate): Secondary | ICD-10-CM | POA: Diagnosis not present

## 2018-08-03 DIAGNOSIS — Z86718 Personal history of other venous thrombosis and embolism: Secondary | ICD-10-CM | POA: Diagnosis not present

## 2018-08-03 DIAGNOSIS — Z7901 Long term (current) use of anticoagulants: Secondary | ICD-10-CM | POA: Diagnosis not present

## 2018-08-03 DIAGNOSIS — D649 Anemia, unspecified: Secondary | ICD-10-CM | POA: Diagnosis not present

## 2018-08-04 DIAGNOSIS — I10 Essential (primary) hypertension: Secondary | ICD-10-CM | POA: Diagnosis not present

## 2018-08-04 DIAGNOSIS — D649 Anemia, unspecified: Secondary | ICD-10-CM | POA: Diagnosis not present

## 2018-08-17 DIAGNOSIS — N183 Chronic kidney disease, stage 3 (moderate): Secondary | ICD-10-CM | POA: Diagnosis not present

## 2018-08-17 DIAGNOSIS — I10 Essential (primary) hypertension: Secondary | ICD-10-CM | POA: Diagnosis not present

## 2018-08-17 DIAGNOSIS — D649 Anemia, unspecified: Secondary | ICD-10-CM | POA: Diagnosis not present

## 2018-08-17 DIAGNOSIS — Z79899 Other long term (current) drug therapy: Secondary | ICD-10-CM | POA: Diagnosis not present

## 2018-09-04 DIAGNOSIS — R262 Difficulty in walking, not elsewhere classified: Secondary | ICD-10-CM | POA: Diagnosis not present

## 2018-09-04 DIAGNOSIS — N200 Calculus of kidney: Secondary | ICD-10-CM | POA: Diagnosis not present

## 2018-09-07 DIAGNOSIS — R262 Difficulty in walking, not elsewhere classified: Secondary | ICD-10-CM | POA: Diagnosis not present

## 2018-09-07 DIAGNOSIS — N200 Calculus of kidney: Secondary | ICD-10-CM | POA: Diagnosis not present

## 2018-09-08 DIAGNOSIS — R262 Difficulty in walking, not elsewhere classified: Secondary | ICD-10-CM | POA: Diagnosis not present

## 2018-09-08 DIAGNOSIS — N200 Calculus of kidney: Secondary | ICD-10-CM | POA: Diagnosis not present

## 2018-09-09 DIAGNOSIS — R262 Difficulty in walking, not elsewhere classified: Secondary | ICD-10-CM | POA: Diagnosis not present

## 2018-09-09 DIAGNOSIS — N200 Calculus of kidney: Secondary | ICD-10-CM | POA: Diagnosis not present

## 2018-09-10 DIAGNOSIS — N200 Calculus of kidney: Secondary | ICD-10-CM | POA: Diagnosis not present

## 2018-09-10 DIAGNOSIS — R262 Difficulty in walking, not elsewhere classified: Secondary | ICD-10-CM | POA: Diagnosis not present

## 2018-09-11 DIAGNOSIS — R262 Difficulty in walking, not elsewhere classified: Secondary | ICD-10-CM | POA: Diagnosis not present

## 2018-09-11 DIAGNOSIS — N200 Calculus of kidney: Secondary | ICD-10-CM | POA: Diagnosis not present

## 2018-09-13 DIAGNOSIS — R262 Difficulty in walking, not elsewhere classified: Secondary | ICD-10-CM | POA: Diagnosis not present

## 2018-09-13 DIAGNOSIS — N200 Calculus of kidney: Secondary | ICD-10-CM | POA: Diagnosis not present

## 2018-09-15 DIAGNOSIS — N200 Calculus of kidney: Secondary | ICD-10-CM | POA: Diagnosis not present

## 2018-09-15 DIAGNOSIS — R262 Difficulty in walking, not elsewhere classified: Secondary | ICD-10-CM | POA: Diagnosis not present

## 2018-09-16 DIAGNOSIS — N200 Calculus of kidney: Secondary | ICD-10-CM | POA: Diagnosis not present

## 2018-09-16 DIAGNOSIS — R262 Difficulty in walking, not elsewhere classified: Secondary | ICD-10-CM | POA: Diagnosis not present

## 2018-09-17 DIAGNOSIS — N200 Calculus of kidney: Secondary | ICD-10-CM | POA: Diagnosis not present

## 2018-09-17 DIAGNOSIS — R262 Difficulty in walking, not elsewhere classified: Secondary | ICD-10-CM | POA: Diagnosis not present

## 2018-09-18 DIAGNOSIS — R262 Difficulty in walking, not elsewhere classified: Secondary | ICD-10-CM | POA: Diagnosis not present

## 2018-09-18 DIAGNOSIS — N200 Calculus of kidney: Secondary | ICD-10-CM | POA: Diagnosis not present

## 2018-09-19 DIAGNOSIS — N200 Calculus of kidney: Secondary | ICD-10-CM | POA: Diagnosis not present

## 2018-09-19 DIAGNOSIS — R262 Difficulty in walking, not elsewhere classified: Secondary | ICD-10-CM | POA: Diagnosis not present

## 2018-09-21 DIAGNOSIS — R262 Difficulty in walking, not elsewhere classified: Secondary | ICD-10-CM | POA: Diagnosis not present

## 2018-09-21 DIAGNOSIS — N200 Calculus of kidney: Secondary | ICD-10-CM | POA: Diagnosis not present

## 2018-09-22 DIAGNOSIS — N200 Calculus of kidney: Secondary | ICD-10-CM | POA: Diagnosis not present

## 2018-09-22 DIAGNOSIS — R262 Difficulty in walking, not elsewhere classified: Secondary | ICD-10-CM | POA: Diagnosis not present

## 2018-09-23 DIAGNOSIS — F0280 Dementia in other diseases classified elsewhere without behavioral disturbance: Secondary | ICD-10-CM | POA: Diagnosis not present

## 2018-09-23 DIAGNOSIS — Z86718 Personal history of other venous thrombosis and embolism: Secondary | ICD-10-CM | POA: Diagnosis not present

## 2018-09-23 DIAGNOSIS — K5900 Constipation, unspecified: Secondary | ICD-10-CM | POA: Diagnosis not present

## 2018-09-23 DIAGNOSIS — N200 Calculus of kidney: Secondary | ICD-10-CM | POA: Diagnosis not present

## 2018-09-23 DIAGNOSIS — G308 Other Alzheimer's disease: Secondary | ICD-10-CM | POA: Diagnosis not present

## 2018-09-23 DIAGNOSIS — R262 Difficulty in walking, not elsewhere classified: Secondary | ICD-10-CM | POA: Diagnosis not present

## 2018-09-24 DIAGNOSIS — G308 Other Alzheimer's disease: Secondary | ICD-10-CM | POA: Diagnosis not present

## 2018-09-24 DIAGNOSIS — R262 Difficulty in walking, not elsewhere classified: Secondary | ICD-10-CM | POA: Diagnosis not present

## 2018-09-24 DIAGNOSIS — N200 Calculus of kidney: Secondary | ICD-10-CM | POA: Diagnosis not present

## 2018-09-25 DIAGNOSIS — R262 Difficulty in walking, not elsewhere classified: Secondary | ICD-10-CM | POA: Diagnosis not present

## 2018-09-25 DIAGNOSIS — N200 Calculus of kidney: Secondary | ICD-10-CM | POA: Diagnosis not present

## 2018-09-25 DIAGNOSIS — G308 Other Alzheimer's disease: Secondary | ICD-10-CM | POA: Diagnosis not present

## 2018-09-28 DIAGNOSIS — G308 Other Alzheimer's disease: Secondary | ICD-10-CM | POA: Diagnosis not present

## 2018-09-28 DIAGNOSIS — R262 Difficulty in walking, not elsewhere classified: Secondary | ICD-10-CM | POA: Diagnosis not present

## 2018-09-28 DIAGNOSIS — N200 Calculus of kidney: Secondary | ICD-10-CM | POA: Diagnosis not present

## 2018-09-29 DIAGNOSIS — R262 Difficulty in walking, not elsewhere classified: Secondary | ICD-10-CM | POA: Diagnosis not present

## 2018-09-29 DIAGNOSIS — N200 Calculus of kidney: Secondary | ICD-10-CM | POA: Diagnosis not present

## 2018-09-29 DIAGNOSIS — G308 Other Alzheimer's disease: Secondary | ICD-10-CM | POA: Diagnosis not present

## 2018-09-30 DIAGNOSIS — N200 Calculus of kidney: Secondary | ICD-10-CM | POA: Diagnosis not present

## 2018-09-30 DIAGNOSIS — G308 Other Alzheimer's disease: Secondary | ICD-10-CM | POA: Diagnosis not present

## 2018-09-30 DIAGNOSIS — R262 Difficulty in walking, not elsewhere classified: Secondary | ICD-10-CM | POA: Diagnosis not present

## 2018-10-01 DIAGNOSIS — G308 Other Alzheimer's disease: Secondary | ICD-10-CM | POA: Diagnosis not present

## 2018-10-01 DIAGNOSIS — N200 Calculus of kidney: Secondary | ICD-10-CM | POA: Diagnosis not present

## 2018-10-01 DIAGNOSIS — R262 Difficulty in walking, not elsewhere classified: Secondary | ICD-10-CM | POA: Diagnosis not present

## 2018-10-02 DIAGNOSIS — N200 Calculus of kidney: Secondary | ICD-10-CM | POA: Diagnosis not present

## 2018-10-02 DIAGNOSIS — R262 Difficulty in walking, not elsewhere classified: Secondary | ICD-10-CM | POA: Diagnosis not present

## 2018-10-02 DIAGNOSIS — G308 Other Alzheimer's disease: Secondary | ICD-10-CM | POA: Diagnosis not present

## 2018-10-05 DIAGNOSIS — R262 Difficulty in walking, not elsewhere classified: Secondary | ICD-10-CM | POA: Diagnosis not present

## 2018-10-05 DIAGNOSIS — G308 Other Alzheimer's disease: Secondary | ICD-10-CM | POA: Diagnosis not present

## 2018-10-05 DIAGNOSIS — N200 Calculus of kidney: Secondary | ICD-10-CM | POA: Diagnosis not present

## 2018-10-06 DIAGNOSIS — N200 Calculus of kidney: Secondary | ICD-10-CM | POA: Diagnosis not present

## 2018-10-06 DIAGNOSIS — G308 Other Alzheimer's disease: Secondary | ICD-10-CM | POA: Diagnosis not present

## 2018-10-06 DIAGNOSIS — R262 Difficulty in walking, not elsewhere classified: Secondary | ICD-10-CM | POA: Diagnosis not present

## 2018-10-07 DIAGNOSIS — G308 Other Alzheimer's disease: Secondary | ICD-10-CM | POA: Diagnosis not present

## 2018-10-07 DIAGNOSIS — N200 Calculus of kidney: Secondary | ICD-10-CM | POA: Diagnosis not present

## 2018-10-07 DIAGNOSIS — R262 Difficulty in walking, not elsewhere classified: Secondary | ICD-10-CM | POA: Diagnosis not present

## 2018-10-08 DIAGNOSIS — R262 Difficulty in walking, not elsewhere classified: Secondary | ICD-10-CM | POA: Diagnosis not present

## 2018-10-08 DIAGNOSIS — G308 Other Alzheimer's disease: Secondary | ICD-10-CM | POA: Diagnosis not present

## 2018-10-08 DIAGNOSIS — N200 Calculus of kidney: Secondary | ICD-10-CM | POA: Diagnosis not present

## 2018-10-09 DIAGNOSIS — G308 Other Alzheimer's disease: Secondary | ICD-10-CM | POA: Diagnosis not present

## 2018-10-09 DIAGNOSIS — N200 Calculus of kidney: Secondary | ICD-10-CM | POA: Diagnosis not present

## 2018-10-09 DIAGNOSIS — R262 Difficulty in walking, not elsewhere classified: Secondary | ICD-10-CM | POA: Diagnosis not present

## 2018-10-12 DIAGNOSIS — N200 Calculus of kidney: Secondary | ICD-10-CM | POA: Diagnosis not present

## 2018-10-12 DIAGNOSIS — G308 Other Alzheimer's disease: Secondary | ICD-10-CM | POA: Diagnosis not present

## 2018-10-12 DIAGNOSIS — R262 Difficulty in walking, not elsewhere classified: Secondary | ICD-10-CM | POA: Diagnosis not present

## 2018-10-13 DIAGNOSIS — R262 Difficulty in walking, not elsewhere classified: Secondary | ICD-10-CM | POA: Diagnosis not present

## 2018-10-13 DIAGNOSIS — G308 Other Alzheimer's disease: Secondary | ICD-10-CM | POA: Diagnosis not present

## 2018-10-13 DIAGNOSIS — N200 Calculus of kidney: Secondary | ICD-10-CM | POA: Diagnosis not present

## 2018-10-14 DIAGNOSIS — R262 Difficulty in walking, not elsewhere classified: Secondary | ICD-10-CM | POA: Diagnosis not present

## 2018-10-14 DIAGNOSIS — N200 Calculus of kidney: Secondary | ICD-10-CM | POA: Diagnosis not present

## 2018-10-14 DIAGNOSIS — G308 Other Alzheimer's disease: Secondary | ICD-10-CM | POA: Diagnosis not present

## 2018-10-15 DIAGNOSIS — G308 Other Alzheimer's disease: Secondary | ICD-10-CM | POA: Diagnosis not present

## 2018-10-15 DIAGNOSIS — R262 Difficulty in walking, not elsewhere classified: Secondary | ICD-10-CM | POA: Diagnosis not present

## 2018-10-15 DIAGNOSIS — N200 Calculus of kidney: Secondary | ICD-10-CM | POA: Diagnosis not present

## 2018-10-16 DIAGNOSIS — N200 Calculus of kidney: Secondary | ICD-10-CM | POA: Diagnosis not present

## 2018-10-16 DIAGNOSIS — R262 Difficulty in walking, not elsewhere classified: Secondary | ICD-10-CM | POA: Diagnosis not present

## 2018-10-16 DIAGNOSIS — G308 Other Alzheimer's disease: Secondary | ICD-10-CM | POA: Diagnosis not present

## 2018-11-16 DIAGNOSIS — Z86718 Personal history of other venous thrombosis and embolism: Secondary | ICD-10-CM | POA: Diagnosis not present

## 2018-11-16 DIAGNOSIS — D649 Anemia, unspecified: Secondary | ICD-10-CM | POA: Diagnosis not present

## 2018-11-16 DIAGNOSIS — N183 Chronic kidney disease, stage 3 (moderate): Secondary | ICD-10-CM | POA: Diagnosis not present

## 2018-11-16 DIAGNOSIS — Z7901 Long term (current) use of anticoagulants: Secondary | ICD-10-CM | POA: Diagnosis not present

## 2018-12-23 DIAGNOSIS — E46 Unspecified protein-calorie malnutrition: Secondary | ICD-10-CM | POA: Diagnosis not present

## 2018-12-23 DIAGNOSIS — G9341 Metabolic encephalopathy: Secondary | ICD-10-CM | POA: Diagnosis not present

## 2018-12-23 DIAGNOSIS — K829 Disease of gallbladder, unspecified: Secondary | ICD-10-CM | POA: Diagnosis not present

## 2018-12-28 DIAGNOSIS — G479 Sleep disorder, unspecified: Secondary | ICD-10-CM | POA: Diagnosis not present

## 2018-12-28 DIAGNOSIS — G308 Other Alzheimer's disease: Secondary | ICD-10-CM | POA: Diagnosis not present

## 2018-12-28 DIAGNOSIS — F0280 Dementia in other diseases classified elsewhere without behavioral disturbance: Secondary | ICD-10-CM | POA: Diagnosis not present

## 2019-01-01 DIAGNOSIS — E440 Moderate protein-calorie malnutrition: Secondary | ICD-10-CM | POA: Diagnosis not present

## 2019-01-01 DIAGNOSIS — Z5181 Encounter for therapeutic drug level monitoring: Secondary | ICD-10-CM | POA: Diagnosis not present

## 2019-01-01 DIAGNOSIS — D649 Anemia, unspecified: Secondary | ICD-10-CM | POA: Diagnosis not present

## 2019-01-01 DIAGNOSIS — Z79899 Other long term (current) drug therapy: Secondary | ICD-10-CM | POA: Diagnosis not present

## 2019-01-01 DIAGNOSIS — I10 Essential (primary) hypertension: Secondary | ICD-10-CM | POA: Diagnosis not present

## 2019-01-11 DIAGNOSIS — D649 Anemia, unspecified: Secondary | ICD-10-CM | POA: Diagnosis not present

## 2019-01-11 DIAGNOSIS — F0280 Dementia in other diseases classified elsewhere without behavioral disturbance: Secondary | ICD-10-CM | POA: Diagnosis not present

## 2019-01-11 DIAGNOSIS — G308 Other Alzheimer's disease: Secondary | ICD-10-CM | POA: Diagnosis not present

## 2019-01-11 DIAGNOSIS — N183 Chronic kidney disease, stage 3 (moderate): Secondary | ICD-10-CM | POA: Diagnosis not present

## 2019-01-29 DIAGNOSIS — M6281 Muscle weakness (generalized): Secondary | ICD-10-CM | POA: Diagnosis not present

## 2019-01-29 DIAGNOSIS — G308 Other Alzheimer's disease: Secondary | ICD-10-CM | POA: Diagnosis not present

## 2019-02-01 DIAGNOSIS — M6281 Muscle weakness (generalized): Secondary | ICD-10-CM | POA: Diagnosis not present

## 2019-02-01 DIAGNOSIS — G308 Other Alzheimer's disease: Secondary | ICD-10-CM | POA: Diagnosis not present

## 2019-02-02 DIAGNOSIS — G308 Other Alzheimer's disease: Secondary | ICD-10-CM | POA: Diagnosis not present

## 2019-02-02 DIAGNOSIS — M6281 Muscle weakness (generalized): Secondary | ICD-10-CM | POA: Diagnosis not present

## 2019-02-03 DIAGNOSIS — G308 Other Alzheimer's disease: Secondary | ICD-10-CM | POA: Diagnosis not present

## 2019-02-03 DIAGNOSIS — M6281 Muscle weakness (generalized): Secondary | ICD-10-CM | POA: Diagnosis not present

## 2019-02-04 DIAGNOSIS — G308 Other Alzheimer's disease: Secondary | ICD-10-CM | POA: Diagnosis not present

## 2019-02-04 DIAGNOSIS — M6281 Muscle weakness (generalized): Secondary | ICD-10-CM | POA: Diagnosis not present

## 2019-02-05 DIAGNOSIS — M6281 Muscle weakness (generalized): Secondary | ICD-10-CM | POA: Diagnosis not present

## 2019-02-05 DIAGNOSIS — G308 Other Alzheimer's disease: Secondary | ICD-10-CM | POA: Diagnosis not present

## 2019-02-08 DIAGNOSIS — M6281 Muscle weakness (generalized): Secondary | ICD-10-CM | POA: Diagnosis not present

## 2019-02-08 DIAGNOSIS — G308 Other Alzheimer's disease: Secondary | ICD-10-CM | POA: Diagnosis not present

## 2019-02-09 DIAGNOSIS — G308 Other Alzheimer's disease: Secondary | ICD-10-CM | POA: Diagnosis not present

## 2019-02-09 DIAGNOSIS — M6281 Muscle weakness (generalized): Secondary | ICD-10-CM | POA: Diagnosis not present

## 2019-02-10 DIAGNOSIS — G308 Other Alzheimer's disease: Secondary | ICD-10-CM | POA: Diagnosis not present

## 2019-02-10 DIAGNOSIS — M6281 Muscle weakness (generalized): Secondary | ICD-10-CM | POA: Diagnosis not present

## 2019-02-11 DIAGNOSIS — M6281 Muscle weakness (generalized): Secondary | ICD-10-CM | POA: Diagnosis not present

## 2019-02-11 DIAGNOSIS — G308 Other Alzheimer's disease: Secondary | ICD-10-CM | POA: Diagnosis not present

## 2019-02-12 DIAGNOSIS — M6281 Muscle weakness (generalized): Secondary | ICD-10-CM | POA: Diagnosis not present

## 2019-02-12 DIAGNOSIS — G308 Other Alzheimer's disease: Secondary | ICD-10-CM | POA: Diagnosis not present

## 2019-02-15 DIAGNOSIS — G308 Other Alzheimer's disease: Secondary | ICD-10-CM | POA: Diagnosis not present

## 2019-02-15 DIAGNOSIS — M6281 Muscle weakness (generalized): Secondary | ICD-10-CM | POA: Diagnosis not present

## 2019-02-16 DIAGNOSIS — G308 Other Alzheimer's disease: Secondary | ICD-10-CM | POA: Diagnosis not present

## 2019-02-16 DIAGNOSIS — M6281 Muscle weakness (generalized): Secondary | ICD-10-CM | POA: Diagnosis not present

## 2019-02-17 DIAGNOSIS — M6281 Muscle weakness (generalized): Secondary | ICD-10-CM | POA: Diagnosis not present

## 2019-02-17 DIAGNOSIS — G308 Other Alzheimer's disease: Secondary | ICD-10-CM | POA: Diagnosis not present

## 2019-02-18 DIAGNOSIS — G308 Other Alzheimer's disease: Secondary | ICD-10-CM | POA: Diagnosis not present

## 2019-02-18 DIAGNOSIS — M6281 Muscle weakness (generalized): Secondary | ICD-10-CM | POA: Diagnosis not present

## 2019-02-19 DIAGNOSIS — M6281 Muscle weakness (generalized): Secondary | ICD-10-CM | POA: Diagnosis not present

## 2019-02-19 DIAGNOSIS — G308 Other Alzheimer's disease: Secondary | ICD-10-CM | POA: Diagnosis not present

## 2019-02-22 DIAGNOSIS — M6281 Muscle weakness (generalized): Secondary | ICD-10-CM | POA: Diagnosis not present

## 2019-02-22 DIAGNOSIS — G308 Other Alzheimer's disease: Secondary | ICD-10-CM | POA: Diagnosis not present

## 2019-02-23 DIAGNOSIS — G308 Other Alzheimer's disease: Secondary | ICD-10-CM | POA: Diagnosis not present

## 2019-02-23 DIAGNOSIS — M6281 Muscle weakness (generalized): Secondary | ICD-10-CM | POA: Diagnosis not present

## 2019-02-24 DIAGNOSIS — G308 Other Alzheimer's disease: Secondary | ICD-10-CM | POA: Diagnosis not present

## 2019-02-24 DIAGNOSIS — M6281 Muscle weakness (generalized): Secondary | ICD-10-CM | POA: Diagnosis not present

## 2019-02-25 DIAGNOSIS — M6281 Muscle weakness (generalized): Secondary | ICD-10-CM | POA: Diagnosis not present

## 2019-02-25 DIAGNOSIS — G308 Other Alzheimer's disease: Secondary | ICD-10-CM | POA: Diagnosis not present

## 2019-02-26 DIAGNOSIS — M6281 Muscle weakness (generalized): Secondary | ICD-10-CM | POA: Diagnosis not present

## 2019-02-26 DIAGNOSIS — G308 Other Alzheimer's disease: Secondary | ICD-10-CM | POA: Diagnosis not present

## 2019-03-01 DIAGNOSIS — M6281 Muscle weakness (generalized): Secondary | ICD-10-CM | POA: Diagnosis not present

## 2019-03-01 DIAGNOSIS — G308 Other Alzheimer's disease: Secondary | ICD-10-CM | POA: Diagnosis not present

## 2019-03-02 DIAGNOSIS — M6281 Muscle weakness (generalized): Secondary | ICD-10-CM | POA: Diagnosis not present

## 2019-03-02 DIAGNOSIS — G308 Other Alzheimer's disease: Secondary | ICD-10-CM | POA: Diagnosis not present

## 2019-03-03 DIAGNOSIS — G308 Other Alzheimer's disease: Secondary | ICD-10-CM | POA: Diagnosis not present

## 2019-03-03 DIAGNOSIS — M6281 Muscle weakness (generalized): Secondary | ICD-10-CM | POA: Diagnosis not present

## 2019-03-04 DIAGNOSIS — Z86718 Personal history of other venous thrombosis and embolism: Secondary | ICD-10-CM | POA: Diagnosis not present

## 2019-03-04 DIAGNOSIS — M6281 Muscle weakness (generalized): Secondary | ICD-10-CM | POA: Diagnosis not present

## 2019-03-04 DIAGNOSIS — G308 Other Alzheimer's disease: Secondary | ICD-10-CM | POA: Diagnosis not present

## 2019-03-04 DIAGNOSIS — F0280 Dementia in other diseases classified elsewhere without behavioral disturbance: Secondary | ICD-10-CM | POA: Diagnosis not present

## 2019-03-04 DIAGNOSIS — N183 Chronic kidney disease, stage 3 (moderate): Secondary | ICD-10-CM | POA: Diagnosis not present

## 2019-03-05 DIAGNOSIS — G308 Other Alzheimer's disease: Secondary | ICD-10-CM | POA: Diagnosis not present

## 2019-03-05 DIAGNOSIS — M6281 Muscle weakness (generalized): Secondary | ICD-10-CM | POA: Diagnosis not present

## 2019-03-08 DIAGNOSIS — G308 Other Alzheimer's disease: Secondary | ICD-10-CM | POA: Diagnosis not present

## 2019-03-08 DIAGNOSIS — M6281 Muscle weakness (generalized): Secondary | ICD-10-CM | POA: Diagnosis not present

## 2019-03-09 DIAGNOSIS — G308 Other Alzheimer's disease: Secondary | ICD-10-CM | POA: Diagnosis not present

## 2019-03-09 DIAGNOSIS — M6281 Muscle weakness (generalized): Secondary | ICD-10-CM | POA: Diagnosis not present

## 2019-03-10 DIAGNOSIS — M6281 Muscle weakness (generalized): Secondary | ICD-10-CM | POA: Diagnosis not present

## 2019-03-10 DIAGNOSIS — G308 Other Alzheimer's disease: Secondary | ICD-10-CM | POA: Diagnosis not present

## 2019-03-11 DIAGNOSIS — G308 Other Alzheimer's disease: Secondary | ICD-10-CM | POA: Diagnosis not present

## 2019-03-11 DIAGNOSIS — M6281 Muscle weakness (generalized): Secondary | ICD-10-CM | POA: Diagnosis not present

## 2019-03-12 DIAGNOSIS — G308 Other Alzheimer's disease: Secondary | ICD-10-CM | POA: Diagnosis not present

## 2019-03-12 DIAGNOSIS — M6281 Muscle weakness (generalized): Secondary | ICD-10-CM | POA: Diagnosis not present

## 2019-03-15 DIAGNOSIS — G308 Other Alzheimer's disease: Secondary | ICD-10-CM | POA: Diagnosis not present

## 2019-03-15 DIAGNOSIS — M6281 Muscle weakness (generalized): Secondary | ICD-10-CM | POA: Diagnosis not present

## 2019-03-16 DIAGNOSIS — M6281 Muscle weakness (generalized): Secondary | ICD-10-CM | POA: Diagnosis not present

## 2019-03-16 DIAGNOSIS — G308 Other Alzheimer's disease: Secondary | ICD-10-CM | POA: Diagnosis not present

## 2019-03-17 DIAGNOSIS — M6281 Muscle weakness (generalized): Secondary | ICD-10-CM | POA: Diagnosis not present

## 2019-03-17 DIAGNOSIS — G308 Other Alzheimer's disease: Secondary | ICD-10-CM | POA: Diagnosis not present

## 2019-03-18 DIAGNOSIS — G308 Other Alzheimer's disease: Secondary | ICD-10-CM | POA: Diagnosis not present

## 2019-03-18 DIAGNOSIS — M6281 Muscle weakness (generalized): Secondary | ICD-10-CM | POA: Diagnosis not present

## 2019-03-19 DIAGNOSIS — G308 Other Alzheimer's disease: Secondary | ICD-10-CM | POA: Diagnosis not present

## 2019-03-19 DIAGNOSIS — M6281 Muscle weakness (generalized): Secondary | ICD-10-CM | POA: Diagnosis not present

## 2019-03-22 DIAGNOSIS — M6281 Muscle weakness (generalized): Secondary | ICD-10-CM | POA: Diagnosis not present

## 2019-03-22 DIAGNOSIS — G308 Other Alzheimer's disease: Secondary | ICD-10-CM | POA: Diagnosis not present

## 2019-03-23 DIAGNOSIS — G308 Other Alzheimer's disease: Secondary | ICD-10-CM | POA: Diagnosis not present

## 2019-03-23 DIAGNOSIS — M6281 Muscle weakness (generalized): Secondary | ICD-10-CM | POA: Diagnosis not present

## 2019-03-24 DIAGNOSIS — G308 Other Alzheimer's disease: Secondary | ICD-10-CM | POA: Diagnosis not present

## 2019-03-24 DIAGNOSIS — M6281 Muscle weakness (generalized): Secondary | ICD-10-CM | POA: Diagnosis not present

## 2019-03-25 DIAGNOSIS — G308 Other Alzheimer's disease: Secondary | ICD-10-CM | POA: Diagnosis not present

## 2019-03-25 DIAGNOSIS — M6281 Muscle weakness (generalized): Secondary | ICD-10-CM | POA: Diagnosis not present

## 2019-04-26 ENCOUNTER — Other Ambulatory Visit: Payer: Self-pay

## 2019-05-05 DIAGNOSIS — Z87898 Personal history of other specified conditions: Secondary | ICD-10-CM | POA: Diagnosis not present

## 2019-05-05 DIAGNOSIS — F0280 Dementia in other diseases classified elsewhere without behavioral disturbance: Secondary | ICD-10-CM | POA: Diagnosis not present

## 2019-05-05 DIAGNOSIS — D649 Anemia, unspecified: Secondary | ICD-10-CM | POA: Diagnosis not present

## 2019-06-14 DIAGNOSIS — Z20828 Contact with and (suspected) exposure to other viral communicable diseases: Secondary | ICD-10-CM | POA: Diagnosis not present

## 2019-06-21 DIAGNOSIS — R41841 Cognitive communication deficit: Secondary | ICD-10-CM | POA: Diagnosis not present

## 2019-06-21 DIAGNOSIS — F0280 Dementia in other diseases classified elsewhere without behavioral disturbance: Secondary | ICD-10-CM | POA: Diagnosis not present

## 2019-06-21 DIAGNOSIS — Z20828 Contact with and (suspected) exposure to other viral communicable diseases: Secondary | ICD-10-CM | POA: Diagnosis not present

## 2019-06-21 DIAGNOSIS — R1312 Dysphagia, oropharyngeal phase: Secondary | ICD-10-CM | POA: Diagnosis not present

## 2019-06-21 DIAGNOSIS — R1310 Dysphagia, unspecified: Secondary | ICD-10-CM | POA: Diagnosis not present

## 2019-06-22 DIAGNOSIS — F0280 Dementia in other diseases classified elsewhere without behavioral disturbance: Secondary | ICD-10-CM | POA: Diagnosis not present

## 2019-06-22 DIAGNOSIS — R1310 Dysphagia, unspecified: Secondary | ICD-10-CM | POA: Diagnosis not present

## 2019-06-22 DIAGNOSIS — R41841 Cognitive communication deficit: Secondary | ICD-10-CM | POA: Diagnosis not present

## 2019-06-22 DIAGNOSIS — R1312 Dysphagia, oropharyngeal phase: Secondary | ICD-10-CM | POA: Diagnosis not present

## 2019-06-23 DIAGNOSIS — R1310 Dysphagia, unspecified: Secondary | ICD-10-CM | POA: Diagnosis not present

## 2019-06-23 DIAGNOSIS — R41841 Cognitive communication deficit: Secondary | ICD-10-CM | POA: Diagnosis not present

## 2019-06-23 DIAGNOSIS — R1312 Dysphagia, oropharyngeal phase: Secondary | ICD-10-CM | POA: Diagnosis not present

## 2019-06-23 DIAGNOSIS — F0280 Dementia in other diseases classified elsewhere without behavioral disturbance: Secondary | ICD-10-CM | POA: Diagnosis not present

## 2019-06-24 DIAGNOSIS — F0280 Dementia in other diseases classified elsewhere without behavioral disturbance: Secondary | ICD-10-CM | POA: Diagnosis not present

## 2019-06-24 DIAGNOSIS — R1312 Dysphagia, oropharyngeal phase: Secondary | ICD-10-CM | POA: Diagnosis not present

## 2019-06-24 DIAGNOSIS — R41841 Cognitive communication deficit: Secondary | ICD-10-CM | POA: Diagnosis not present

## 2019-06-24 DIAGNOSIS — R1310 Dysphagia, unspecified: Secondary | ICD-10-CM | POA: Diagnosis not present

## 2019-06-25 DIAGNOSIS — R1312 Dysphagia, oropharyngeal phase: Secondary | ICD-10-CM | POA: Diagnosis not present

## 2019-06-25 DIAGNOSIS — F0280 Dementia in other diseases classified elsewhere without behavioral disturbance: Secondary | ICD-10-CM | POA: Diagnosis not present

## 2019-06-25 DIAGNOSIS — R41841 Cognitive communication deficit: Secondary | ICD-10-CM | POA: Diagnosis not present

## 2019-06-25 DIAGNOSIS — R1310 Dysphagia, unspecified: Secondary | ICD-10-CM | POA: Diagnosis not present

## 2019-06-28 DIAGNOSIS — D649 Anemia, unspecified: Secondary | ICD-10-CM | POA: Diagnosis not present

## 2019-06-28 DIAGNOSIS — F0280 Dementia in other diseases classified elsewhere without behavioral disturbance: Secondary | ICD-10-CM | POA: Diagnosis not present

## 2019-06-28 DIAGNOSIS — Z86718 Personal history of other venous thrombosis and embolism: Secondary | ICD-10-CM | POA: Diagnosis not present

## 2019-06-28 DIAGNOSIS — R1312 Dysphagia, oropharyngeal phase: Secondary | ICD-10-CM | POA: Diagnosis not present

## 2019-06-28 DIAGNOSIS — G308 Other Alzheimer's disease: Secondary | ICD-10-CM | POA: Diagnosis not present

## 2019-06-28 DIAGNOSIS — R1310 Dysphagia, unspecified: Secondary | ICD-10-CM | POA: Diagnosis not present

## 2019-06-28 DIAGNOSIS — R41841 Cognitive communication deficit: Secondary | ICD-10-CM | POA: Diagnosis not present

## 2019-06-29 DIAGNOSIS — R41841 Cognitive communication deficit: Secondary | ICD-10-CM | POA: Diagnosis not present

## 2019-06-29 DIAGNOSIS — F0280 Dementia in other diseases classified elsewhere without behavioral disturbance: Secondary | ICD-10-CM | POA: Diagnosis not present

## 2019-06-29 DIAGNOSIS — Z20828 Contact with and (suspected) exposure to other viral communicable diseases: Secondary | ICD-10-CM | POA: Diagnosis not present

## 2019-06-29 DIAGNOSIS — R1310 Dysphagia, unspecified: Secondary | ICD-10-CM | POA: Diagnosis not present

## 2019-06-29 DIAGNOSIS — R1312 Dysphagia, oropharyngeal phase: Secondary | ICD-10-CM | POA: Diagnosis not present

## 2019-06-30 DIAGNOSIS — R1310 Dysphagia, unspecified: Secondary | ICD-10-CM | POA: Diagnosis not present

## 2019-06-30 DIAGNOSIS — R41841 Cognitive communication deficit: Secondary | ICD-10-CM | POA: Diagnosis not present

## 2019-06-30 DIAGNOSIS — R1312 Dysphagia, oropharyngeal phase: Secondary | ICD-10-CM | POA: Diagnosis not present

## 2019-06-30 DIAGNOSIS — F0280 Dementia in other diseases classified elsewhere without behavioral disturbance: Secondary | ICD-10-CM | POA: Diagnosis not present

## 2019-07-01 DIAGNOSIS — R1310 Dysphagia, unspecified: Secondary | ICD-10-CM | POA: Diagnosis not present

## 2019-07-01 DIAGNOSIS — R1312 Dysphagia, oropharyngeal phase: Secondary | ICD-10-CM | POA: Diagnosis not present

## 2019-07-01 DIAGNOSIS — R41841 Cognitive communication deficit: Secondary | ICD-10-CM | POA: Diagnosis not present

## 2019-07-01 DIAGNOSIS — F0280 Dementia in other diseases classified elsewhere without behavioral disturbance: Secondary | ICD-10-CM | POA: Diagnosis not present

## 2019-07-02 DIAGNOSIS — R1310 Dysphagia, unspecified: Secondary | ICD-10-CM | POA: Diagnosis not present

## 2019-07-02 DIAGNOSIS — F0280 Dementia in other diseases classified elsewhere without behavioral disturbance: Secondary | ICD-10-CM | POA: Diagnosis not present

## 2019-07-02 DIAGNOSIS — Z20828 Contact with and (suspected) exposure to other viral communicable diseases: Secondary | ICD-10-CM | POA: Diagnosis not present

## 2019-07-02 DIAGNOSIS — R1312 Dysphagia, oropharyngeal phase: Secondary | ICD-10-CM | POA: Diagnosis not present

## 2019-07-02 DIAGNOSIS — R41841 Cognitive communication deficit: Secondary | ICD-10-CM | POA: Diagnosis not present

## 2019-07-05 DIAGNOSIS — R1310 Dysphagia, unspecified: Secondary | ICD-10-CM | POA: Diagnosis not present

## 2019-07-05 DIAGNOSIS — F0280 Dementia in other diseases classified elsewhere without behavioral disturbance: Secondary | ICD-10-CM | POA: Diagnosis not present

## 2019-07-05 DIAGNOSIS — R41841 Cognitive communication deficit: Secondary | ICD-10-CM | POA: Diagnosis not present

## 2019-07-05 DIAGNOSIS — R1312 Dysphagia, oropharyngeal phase: Secondary | ICD-10-CM | POA: Diagnosis not present

## 2019-07-06 DIAGNOSIS — F0280 Dementia in other diseases classified elsewhere without behavioral disturbance: Secondary | ICD-10-CM | POA: Diagnosis not present

## 2019-07-06 DIAGNOSIS — R1312 Dysphagia, oropharyngeal phase: Secondary | ICD-10-CM | POA: Diagnosis not present

## 2019-07-06 DIAGNOSIS — R1310 Dysphagia, unspecified: Secondary | ICD-10-CM | POA: Diagnosis not present

## 2019-07-06 DIAGNOSIS — R41841 Cognitive communication deficit: Secondary | ICD-10-CM | POA: Diagnosis not present

## 2019-07-07 DIAGNOSIS — D519 Vitamin B12 deficiency anemia, unspecified: Secondary | ICD-10-CM | POA: Diagnosis not present

## 2019-07-07 DIAGNOSIS — I10 Essential (primary) hypertension: Secondary | ICD-10-CM | POA: Diagnosis not present

## 2019-07-07 DIAGNOSIS — Z20828 Contact with and (suspected) exposure to other viral communicable diseases: Secondary | ICD-10-CM | POA: Diagnosis not present

## 2019-07-07 DIAGNOSIS — R41841 Cognitive communication deficit: Secondary | ICD-10-CM | POA: Diagnosis not present

## 2019-07-07 DIAGNOSIS — R1310 Dysphagia, unspecified: Secondary | ICD-10-CM | POA: Diagnosis not present

## 2019-07-07 DIAGNOSIS — R1312 Dysphagia, oropharyngeal phase: Secondary | ICD-10-CM | POA: Diagnosis not present

## 2019-07-07 DIAGNOSIS — D649 Anemia, unspecified: Secondary | ICD-10-CM | POA: Diagnosis not present

## 2019-07-07 DIAGNOSIS — F0280 Dementia in other diseases classified elsewhere without behavioral disturbance: Secondary | ICD-10-CM | POA: Diagnosis not present

## 2019-07-07 DIAGNOSIS — E039 Hypothyroidism, unspecified: Secondary | ICD-10-CM | POA: Diagnosis not present

## 2019-07-08 DIAGNOSIS — N1831 Chronic kidney disease, stage 3a: Secondary | ICD-10-CM | POA: Diagnosis not present

## 2019-07-08 DIAGNOSIS — G308 Other Alzheimer's disease: Secondary | ICD-10-CM | POA: Diagnosis not present

## 2019-07-08 DIAGNOSIS — R1310 Dysphagia, unspecified: Secondary | ICD-10-CM | POA: Diagnosis not present

## 2019-07-08 DIAGNOSIS — R1312 Dysphagia, oropharyngeal phase: Secondary | ICD-10-CM | POA: Diagnosis not present

## 2019-07-08 DIAGNOSIS — D649 Anemia, unspecified: Secondary | ICD-10-CM | POA: Diagnosis not present

## 2019-07-08 DIAGNOSIS — F0280 Dementia in other diseases classified elsewhere without behavioral disturbance: Secondary | ICD-10-CM | POA: Diagnosis not present

## 2019-07-08 DIAGNOSIS — R41841 Cognitive communication deficit: Secondary | ICD-10-CM | POA: Diagnosis not present

## 2019-07-09 DIAGNOSIS — R1312 Dysphagia, oropharyngeal phase: Secondary | ICD-10-CM | POA: Diagnosis not present

## 2019-07-09 DIAGNOSIS — F0280 Dementia in other diseases classified elsewhere without behavioral disturbance: Secondary | ICD-10-CM | POA: Diagnosis not present

## 2019-07-09 DIAGNOSIS — R41841 Cognitive communication deficit: Secondary | ICD-10-CM | POA: Diagnosis not present

## 2019-07-09 DIAGNOSIS — R1310 Dysphagia, unspecified: Secondary | ICD-10-CM | POA: Diagnosis not present

## 2019-07-11 DIAGNOSIS — Z20828 Contact with and (suspected) exposure to other viral communicable diseases: Secondary | ICD-10-CM | POA: Diagnosis not present

## 2019-07-12 DIAGNOSIS — R1312 Dysphagia, oropharyngeal phase: Secondary | ICD-10-CM | POA: Diagnosis not present

## 2019-07-12 DIAGNOSIS — F0280 Dementia in other diseases classified elsewhere without behavioral disturbance: Secondary | ICD-10-CM | POA: Diagnosis not present

## 2019-07-12 DIAGNOSIS — E039 Hypothyroidism, unspecified: Secondary | ICD-10-CM | POA: Diagnosis not present

## 2019-07-12 DIAGNOSIS — R41841 Cognitive communication deficit: Secondary | ICD-10-CM | POA: Diagnosis not present

## 2019-07-12 DIAGNOSIS — R1310 Dysphagia, unspecified: Secondary | ICD-10-CM | POA: Diagnosis not present

## 2019-07-13 DIAGNOSIS — R1310 Dysphagia, unspecified: Secondary | ICD-10-CM | POA: Diagnosis not present

## 2019-07-13 DIAGNOSIS — R41841 Cognitive communication deficit: Secondary | ICD-10-CM | POA: Diagnosis not present

## 2019-07-13 DIAGNOSIS — R1312 Dysphagia, oropharyngeal phase: Secondary | ICD-10-CM | POA: Diagnosis not present

## 2019-07-13 DIAGNOSIS — F0280 Dementia in other diseases classified elsewhere without behavioral disturbance: Secondary | ICD-10-CM | POA: Diagnosis not present

## 2019-07-14 DIAGNOSIS — R1312 Dysphagia, oropharyngeal phase: Secondary | ICD-10-CM | POA: Diagnosis not present

## 2019-07-14 DIAGNOSIS — R41841 Cognitive communication deficit: Secondary | ICD-10-CM | POA: Diagnosis not present

## 2019-07-14 DIAGNOSIS — F0280 Dementia in other diseases classified elsewhere without behavioral disturbance: Secondary | ICD-10-CM | POA: Diagnosis not present

## 2019-07-14 DIAGNOSIS — R1310 Dysphagia, unspecified: Secondary | ICD-10-CM | POA: Diagnosis not present

## 2019-07-15 DIAGNOSIS — R7989 Other specified abnormal findings of blood chemistry: Secondary | ICD-10-CM | POA: Diagnosis not present

## 2019-07-15 DIAGNOSIS — F0280 Dementia in other diseases classified elsewhere without behavioral disturbance: Secondary | ICD-10-CM | POA: Diagnosis not present

## 2019-07-15 DIAGNOSIS — R1312 Dysphagia, oropharyngeal phase: Secondary | ICD-10-CM | POA: Diagnosis not present

## 2019-07-15 DIAGNOSIS — R41841 Cognitive communication deficit: Secondary | ICD-10-CM | POA: Diagnosis not present

## 2019-07-15 DIAGNOSIS — R1310 Dysphagia, unspecified: Secondary | ICD-10-CM | POA: Diagnosis not present

## 2019-07-16 DIAGNOSIS — R1312 Dysphagia, oropharyngeal phase: Secondary | ICD-10-CM | POA: Diagnosis not present

## 2019-07-16 DIAGNOSIS — R41841 Cognitive communication deficit: Secondary | ICD-10-CM | POA: Diagnosis not present

## 2019-07-16 DIAGNOSIS — R1310 Dysphagia, unspecified: Secondary | ICD-10-CM | POA: Diagnosis not present

## 2019-07-16 DIAGNOSIS — F0280 Dementia in other diseases classified elsewhere without behavioral disturbance: Secondary | ICD-10-CM | POA: Diagnosis not present

## 2019-07-19 DIAGNOSIS — R41841 Cognitive communication deficit: Secondary | ICD-10-CM | POA: Diagnosis not present

## 2019-07-19 DIAGNOSIS — R1310 Dysphagia, unspecified: Secondary | ICD-10-CM | POA: Diagnosis not present

## 2019-07-19 DIAGNOSIS — Z20828 Contact with and (suspected) exposure to other viral communicable diseases: Secondary | ICD-10-CM | POA: Diagnosis not present

## 2019-07-19 DIAGNOSIS — R1312 Dysphagia, oropharyngeal phase: Secondary | ICD-10-CM | POA: Diagnosis not present

## 2019-07-19 DIAGNOSIS — F0280 Dementia in other diseases classified elsewhere without behavioral disturbance: Secondary | ICD-10-CM | POA: Diagnosis not present

## 2019-07-20 DIAGNOSIS — R1312 Dysphagia, oropharyngeal phase: Secondary | ICD-10-CM | POA: Diagnosis not present

## 2019-07-20 DIAGNOSIS — R1310 Dysphagia, unspecified: Secondary | ICD-10-CM | POA: Diagnosis not present

## 2019-07-20 DIAGNOSIS — R41841 Cognitive communication deficit: Secondary | ICD-10-CM | POA: Diagnosis not present

## 2019-07-20 DIAGNOSIS — F0280 Dementia in other diseases classified elsewhere without behavioral disturbance: Secondary | ICD-10-CM | POA: Diagnosis not present

## 2019-07-21 DIAGNOSIS — R41841 Cognitive communication deficit: Secondary | ICD-10-CM | POA: Diagnosis not present

## 2019-07-21 DIAGNOSIS — R1310 Dysphagia, unspecified: Secondary | ICD-10-CM | POA: Diagnosis not present

## 2019-07-21 DIAGNOSIS — F0280 Dementia in other diseases classified elsewhere without behavioral disturbance: Secondary | ICD-10-CM | POA: Diagnosis not present

## 2019-07-21 DIAGNOSIS — R1312 Dysphagia, oropharyngeal phase: Secondary | ICD-10-CM | POA: Diagnosis not present

## 2019-07-22 DIAGNOSIS — R1310 Dysphagia, unspecified: Secondary | ICD-10-CM | POA: Diagnosis not present

## 2019-07-22 DIAGNOSIS — R41841 Cognitive communication deficit: Secondary | ICD-10-CM | POA: Diagnosis not present

## 2019-07-22 DIAGNOSIS — F0280 Dementia in other diseases classified elsewhere without behavioral disturbance: Secondary | ICD-10-CM | POA: Diagnosis not present

## 2019-07-22 DIAGNOSIS — R1312 Dysphagia, oropharyngeal phase: Secondary | ICD-10-CM | POA: Diagnosis not present

## 2019-07-24 DIAGNOSIS — F0280 Dementia in other diseases classified elsewhere without behavioral disturbance: Secondary | ICD-10-CM | POA: Diagnosis not present

## 2019-07-24 DIAGNOSIS — Z20828 Contact with and (suspected) exposure to other viral communicable diseases: Secondary | ICD-10-CM | POA: Diagnosis not present

## 2019-07-24 DIAGNOSIS — R41841 Cognitive communication deficit: Secondary | ICD-10-CM | POA: Diagnosis not present

## 2019-07-24 DIAGNOSIS — R1310 Dysphagia, unspecified: Secondary | ICD-10-CM | POA: Diagnosis not present

## 2019-07-24 DIAGNOSIS — R1312 Dysphagia, oropharyngeal phase: Secondary | ICD-10-CM | POA: Diagnosis not present

## 2019-07-27 DIAGNOSIS — R1312 Dysphagia, oropharyngeal phase: Secondary | ICD-10-CM | POA: Diagnosis not present

## 2019-07-27 DIAGNOSIS — R262 Difficulty in walking, not elsewhere classified: Secondary | ICD-10-CM | POA: Diagnosis not present

## 2019-07-27 DIAGNOSIS — G308 Other Alzheimer's disease: Secondary | ICD-10-CM | POA: Diagnosis not present

## 2019-07-27 DIAGNOSIS — R1310 Dysphagia, unspecified: Secondary | ICD-10-CM | POA: Diagnosis not present

## 2019-07-27 DIAGNOSIS — R41841 Cognitive communication deficit: Secondary | ICD-10-CM | POA: Diagnosis not present

## 2019-07-27 DIAGNOSIS — F0280 Dementia in other diseases classified elsewhere without behavioral disturbance: Secondary | ICD-10-CM | POA: Diagnosis not present

## 2019-08-04 DIAGNOSIS — R41841 Cognitive communication deficit: Secondary | ICD-10-CM | POA: Diagnosis not present

## 2019-08-04 DIAGNOSIS — R262 Difficulty in walking, not elsewhere classified: Secondary | ICD-10-CM | POA: Diagnosis not present

## 2019-08-04 DIAGNOSIS — R1310 Dysphagia, unspecified: Secondary | ICD-10-CM | POA: Diagnosis not present

## 2019-08-04 DIAGNOSIS — R1312 Dysphagia, oropharyngeal phase: Secondary | ICD-10-CM | POA: Diagnosis not present

## 2019-08-04 DIAGNOSIS — G308 Other Alzheimer's disease: Secondary | ICD-10-CM | POA: Diagnosis not present

## 2019-08-04 DIAGNOSIS — F0280 Dementia in other diseases classified elsewhere without behavioral disturbance: Secondary | ICD-10-CM | POA: Diagnosis not present

## 2019-08-05 DIAGNOSIS — G308 Other Alzheimer's disease: Secondary | ICD-10-CM | POA: Diagnosis not present

## 2019-08-05 DIAGNOSIS — R1312 Dysphagia, oropharyngeal phase: Secondary | ICD-10-CM | POA: Diagnosis not present

## 2019-08-05 DIAGNOSIS — R262 Difficulty in walking, not elsewhere classified: Secondary | ICD-10-CM | POA: Diagnosis not present

## 2019-08-05 DIAGNOSIS — F0280 Dementia in other diseases classified elsewhere without behavioral disturbance: Secondary | ICD-10-CM | POA: Diagnosis not present

## 2019-08-05 DIAGNOSIS — R41841 Cognitive communication deficit: Secondary | ICD-10-CM | POA: Diagnosis not present

## 2019-08-05 DIAGNOSIS — R1310 Dysphagia, unspecified: Secondary | ICD-10-CM | POA: Diagnosis not present

## 2019-08-08 ENCOUNTER — Encounter (HOSPITAL_COMMUNITY): Payer: Self-pay | Admitting: Emergency Medicine

## 2019-08-08 ENCOUNTER — Emergency Department (HOSPITAL_COMMUNITY): Payer: Medicare Other

## 2019-08-08 ENCOUNTER — Other Ambulatory Visit: Payer: Self-pay

## 2019-08-08 ENCOUNTER — Inpatient Hospital Stay (HOSPITAL_COMMUNITY)
Admission: EM | Admit: 2019-08-08 | Discharge: 2019-08-12 | DRG: 871 | Disposition: A | Discharge status: Skilled Nursing Facility | Payer: Medicare Other | Attending: Internal Medicine | Admitting: Internal Medicine

## 2019-08-08 DIAGNOSIS — E872 Acidosis, unspecified: Secondary | ICD-10-CM | POA: Diagnosis present

## 2019-08-08 DIAGNOSIS — Z66 Do not resuscitate: Secondary | ICD-10-CM | POA: Diagnosis present

## 2019-08-08 DIAGNOSIS — R0902 Hypoxemia: Secondary | ICD-10-CM | POA: Diagnosis not present

## 2019-08-08 DIAGNOSIS — Z79899 Other long term (current) drug therapy: Secondary | ICD-10-CM

## 2019-08-08 DIAGNOSIS — Z20828 Contact with and (suspected) exposure to other viral communicable diseases: Secondary | ICD-10-CM | POA: Diagnosis present

## 2019-08-08 DIAGNOSIS — R262 Difficulty in walking, not elsewhere classified: Secondary | ICD-10-CM | POA: Diagnosis present

## 2019-08-08 DIAGNOSIS — E860 Dehydration: Secondary | ICD-10-CM | POA: Diagnosis present

## 2019-08-08 DIAGNOSIS — G9341 Metabolic encephalopathy: Secondary | ICD-10-CM | POA: Diagnosis not present

## 2019-08-08 DIAGNOSIS — R5381 Other malaise: Secondary | ICD-10-CM | POA: Diagnosis not present

## 2019-08-08 DIAGNOSIS — R55 Syncope and collapse: Secondary | ICD-10-CM | POA: Diagnosis not present

## 2019-08-08 DIAGNOSIS — F0281 Dementia in other diseases classified elsewhere with behavioral disturbance: Secondary | ICD-10-CM | POA: Diagnosis not present

## 2019-08-08 DIAGNOSIS — N189 Chronic kidney disease, unspecified: Secondary | ICD-10-CM | POA: Diagnosis present

## 2019-08-08 DIAGNOSIS — R32 Unspecified urinary incontinence: Secondary | ICD-10-CM | POA: Diagnosis not present

## 2019-08-08 DIAGNOSIS — N179 Acute kidney failure, unspecified: Secondary | ICD-10-CM | POA: Diagnosis not present

## 2019-08-08 DIAGNOSIS — T68XXXA Hypothermia, initial encounter: Secondary | ICD-10-CM | POA: Diagnosis not present

## 2019-08-08 DIAGNOSIS — R69 Illness, unspecified: Secondary | ICD-10-CM | POA: Diagnosis not present

## 2019-08-08 DIAGNOSIS — F0390 Unspecified dementia without behavioral disturbance: Secondary | ICD-10-CM | POA: Diagnosis present

## 2019-08-08 DIAGNOSIS — Z7901 Long term (current) use of anticoagulants: Secondary | ICD-10-CM | POA: Diagnosis not present

## 2019-08-08 DIAGNOSIS — Z7401 Bed confinement status: Secondary | ICD-10-CM | POA: Diagnosis not present

## 2019-08-08 DIAGNOSIS — N1832 Chronic kidney disease, stage 3b: Secondary | ICD-10-CM

## 2019-08-08 DIAGNOSIS — M6281 Muscle weakness (generalized): Secondary | ICD-10-CM | POA: Diagnosis present

## 2019-08-08 DIAGNOSIS — A4181 Sepsis due to Enterococcus: Principal | ICD-10-CM | POA: Diagnosis present

## 2019-08-08 DIAGNOSIS — N183 Chronic kidney disease, stage 3 unspecified: Secondary | ICD-10-CM | POA: Diagnosis present

## 2019-08-08 DIAGNOSIS — G308 Other Alzheimer's disease: Secondary | ICD-10-CM | POA: Diagnosis present

## 2019-08-08 DIAGNOSIS — F0280 Dementia in other diseases classified elsewhere without behavioral disturbance: Secondary | ICD-10-CM | POA: Diagnosis not present

## 2019-08-08 DIAGNOSIS — R001 Bradycardia, unspecified: Secondary | ICD-10-CM | POA: Diagnosis not present

## 2019-08-08 DIAGNOSIS — E039 Hypothyroidism, unspecified: Secondary | ICD-10-CM | POA: Diagnosis present

## 2019-08-08 DIAGNOSIS — Z86718 Personal history of other venous thrombosis and embolism: Secondary | ICD-10-CM

## 2019-08-08 DIAGNOSIS — A419 Sepsis, unspecified organism: Secondary | ICD-10-CM

## 2019-08-08 DIAGNOSIS — R404 Transient alteration of awareness: Secondary | ICD-10-CM | POA: Diagnosis not present

## 2019-08-08 DIAGNOSIS — G309 Alzheimer's disease, unspecified: Secondary | ICD-10-CM | POA: Diagnosis not present

## 2019-08-08 DIAGNOSIS — R4020 Unspecified coma: Secondary | ICD-10-CM | POA: Diagnosis not present

## 2019-08-08 DIAGNOSIS — I959 Hypotension, unspecified: Secondary | ICD-10-CM | POA: Diagnosis not present

## 2019-08-08 DIAGNOSIS — N390 Urinary tract infection, site not specified: Secondary | ICD-10-CM | POA: Diagnosis not present

## 2019-08-08 DIAGNOSIS — Z03818 Encounter for observation for suspected exposure to other biological agents ruled out: Secondary | ICD-10-CM | POA: Diagnosis not present

## 2019-08-08 LAB — COMPREHENSIVE METABOLIC PANEL
ALT: 10 U/L (ref 0–44)
AST: 21 U/L (ref 15–41)
Albumin: 3.8 g/dL (ref 3.5–5.0)
Alkaline Phosphatase: 75 U/L (ref 38–126)
Anion gap: 11 (ref 5–15)
BUN: 11 mg/dL (ref 8–23)
CO2: 26 mmol/L (ref 22–32)
Calcium: 9.1 mg/dL (ref 8.9–10.3)
Chloride: 107 mmol/L (ref 98–111)
Creatinine, Ser: 1.49 mg/dL — ABNORMAL HIGH (ref 0.61–1.24)
GFR calc Af Amer: 49 mL/min — ABNORMAL LOW (ref >60)
GFR calc non Af Amer: 42 mL/min — ABNORMAL LOW (ref >60)
Glucose, Bld: 111 mg/dL — ABNORMAL HIGH (ref 70–99)
Potassium: 3.7 mmol/L (ref 3.5–5.1)
Sodium: 144 mmol/L (ref 135–145)
Total Bilirubin: 0.8 mg/dL (ref 0.3–1.2)
Total Protein: 7.5 g/dL (ref 6.5–8.1)

## 2019-08-08 LAB — TROPONIN I (HIGH SENSITIVITY)
Troponin I (High Sensitivity): 19 ng/L — ABNORMAL HIGH (ref <18)
Troponin I (High Sensitivity): 19 ng/L — ABNORMAL HIGH (ref <18)

## 2019-08-08 LAB — CBC WITH DIFFERENTIAL/PLATELET
Abs Immature Granulocytes: 0.04 10*3/uL (ref 0.00–0.07)
Basophils Absolute: 0 10*3/uL (ref 0.0–0.1)
Basophils Relative: 0 %
Eosinophils Absolute: 0 10*3/uL (ref 0.0–0.5)
Eosinophils Relative: 0 %
HCT: 47.7 % (ref 39.0–52.0)
Hemoglobin: 15.7 g/dL (ref 13.0–17.0)
Immature Granulocytes: 1 %
Lymphocytes Relative: 8 %
Lymphs Abs: 0.7 10*3/uL (ref 0.7–4.0)
MCH: 29.9 pg (ref 26.0–34.0)
MCHC: 32.9 g/dL (ref 30.0–36.0)
MCV: 90.9 fL (ref 80.0–100.0)
Monocytes Absolute: 0.8 10*3/uL (ref 0.1–1.0)
Monocytes Relative: 10 %
Neutro Abs: 6.6 10*3/uL (ref 1.7–7.7)
Neutrophils Relative %: 81 %
Platelets: 148 10*3/uL — ABNORMAL LOW (ref 150–400)
RBC: 5.25 MIL/uL (ref 4.22–5.81)
RDW: 14.5 % (ref 11.5–15.5)
WBC: 8.1 10*3/uL (ref 4.0–10.5)
nRBC: 0 % (ref 0.0–0.2)

## 2019-08-08 LAB — URINALYSIS, ROUTINE W REFLEX MICROSCOPIC
Bilirubin Urine: NEGATIVE
Glucose, UA: NEGATIVE mg/dL
Ketones, ur: NEGATIVE mg/dL
Nitrite: NEGATIVE
Protein, ur: 30 mg/dL — AB
RBC / HPF: >50 RBC/hpf — ABNORMAL HIGH (ref 0–5)
Specific Gravity, Urine: 1.006 (ref 1.005–1.030)
pH: 6 (ref 5.0–8.0)

## 2019-08-08 LAB — LACTIC ACID, PLASMA
Lactic Acid, Venous: 3.1 mmol/L (ref 0.5–1.9)
Lactic Acid, Venous: 3.3 mmol/L (ref 0.5–1.9)
Lactic Acid, Venous: 2.8 mmol/L (ref 0.5–1.9)

## 2019-08-08 LAB — TSH: TSH: 7.216 u[IU]/mL — ABNORMAL HIGH (ref 0.350–4.500)

## 2019-08-08 LAB — CORTISOL: Cortisol, Plasma: 22.4 ug/dL

## 2019-08-08 LAB — SARS CORONAVIRUS 2 (TAT 6-24 HRS): SARS Coronavirus 2: NEGATIVE

## 2019-08-08 MED ORDER — ONDANSETRON HCL 4 MG PO TABS: 4.0000 mg | ORAL_TABLET | Freq: Four times a day (QID) | ORAL | Status: DC | PRN

## 2019-08-08 MED ORDER — HEPARIN SODIUM (PORCINE) 5000 UNIT/ML IJ SOLN
5000.0000 [IU] | Freq: Three times a day (TID) | INTRAMUSCULAR | Status: DC
  Administered 2019-08-08 – 2019-08-12 (×12): 5000 [IU] via SUBCUTANEOUS
  Filled 2019-08-08 (×12): qty 1

## 2019-08-08 MED ORDER — SODIUM CHLORIDE 0.9 % IV SOLN
Freq: Once | INTRAVENOUS | Status: AC
  Administered 2019-08-08: 09:00:00 via INTRAVENOUS

## 2019-08-08 MED ORDER — LEVOTHYROXINE SODIUM 25 MCG PO TABS
25.0000 ug | ORAL_TABLET | Freq: Every day | ORAL | Status: DC
  Administered 2019-08-10 – 2019-08-11 (×2): 25 ug via ORAL
  Filled 2019-08-08 (×3): qty 1

## 2019-08-08 MED ORDER — ATROPINE SULFATE 1 MG/ML IJ SOLN
1.0000 mg | Freq: Once | INTRAMUSCULAR | Status: DC
  Administered 2019-08-08: 1 mg via INTRAVENOUS

## 2019-08-08 MED ORDER — ACETAMINOPHEN 650 MG RE SUPP: 650.0000 mg | Freq: Four times a day (QID) | RECTAL | Status: DC | PRN

## 2019-08-08 MED ORDER — ATROPINE SULFATE 1 MG/10ML IJ SOSY
0.5000 mg | PREFILLED_SYRINGE | Freq: Once | INTRAMUSCULAR | Status: AC
  Administered 2019-08-08: 0.5 mg via INTRAVENOUS
  Filled 2019-08-08: qty 10

## 2019-08-08 MED ORDER — MEMANTINE HCL 10 MG PO TABS
10.0000 mg | ORAL_TABLET | Freq: Two times a day (BID) | ORAL | Status: DC
  Administered 2019-08-09 – 2019-08-12 (×7): 10 mg via ORAL
  Filled 2019-08-08 (×10): qty 1

## 2019-08-08 MED ORDER — OLANZAPINE 5 MG PO TABS
2.5000 mg | ORAL_TABLET | Freq: Every day | ORAL | Status: DC
  Administered 2019-08-09 – 2019-08-11 (×3): 2.5 mg via ORAL
  Filled 2019-08-08 (×3): qty 1

## 2019-08-08 MED ORDER — SODIUM CHLORIDE 0.9 % IV SOLN
1.0000 g | INTRAVENOUS | Status: DC
  Administered 2019-08-09 – 2019-08-10 (×2): 1 g via INTRAVENOUS
  Filled 2019-08-08 (×2): qty 10

## 2019-08-08 MED ORDER — SODIUM CHLORIDE 0.9 % IV SOLN
INTRAVENOUS | Status: DC
  Administered 2019-08-08 – 2019-08-09 (×2): via INTRAVENOUS
  Administered 2019-08-10: 75 mL/h via INTRAVENOUS
  Administered 2019-08-11: 11:00:00 via INTRAVENOUS

## 2019-08-08 MED ORDER — ONDANSETRON HCL 4 MG/2ML IJ SOLN: 4.0000 mg | Freq: Four times a day (QID) | INTRAMUSCULAR | Status: DC | PRN

## 2019-08-08 MED ORDER — SODIUM CHLORIDE 0.9 % IV SOLN: Freq: Once | INTRAVENOUS | Status: DC

## 2019-08-08 MED ORDER — SODIUM CHLORIDE 0.9 % IV SOLN
1.0000 g | Freq: Once | INTRAVENOUS | Status: AC
  Administered 2019-08-08: 1 g via INTRAVENOUS
  Filled 2019-08-08: qty 10

## 2019-08-08 MED ORDER — SODIUM CHLORIDE 0.9 % IV SOLN
Freq: Once | INTRAVENOUS | Status: AC
  Administered 2019-08-08: 23:00:00 via INTRAVENOUS

## 2019-08-08 MED ORDER — ACETAMINOPHEN 325 MG PO TABS: 650.0000 mg | ORAL_TABLET | Freq: Four times a day (QID) | ORAL | Status: DC | PRN

## 2019-08-08 NOTE — Progress Notes (Signed)
Pt has not voided since on this floor. Bladder scanned and patient was retaining 500 mL. MD made aware, an order for an in and out cath was placed and it was performed. 400 mL of yellow/bloody urine was returned. A blood clot was also passed during the in and out cath process. Will continue to monitor.

## 2019-08-08 NOTE — ED Provider Notes (Signed)
[facility] EMERGENCY DEPARTMENT Provider Note   CSN: [id number] Arrival date & time: 08/08/19  [id number]   Time seen 6:10 AM  History   Chief Complaint Chief Complaint  Patient presents with  . Loss of Consciousness    HPI Cindi CarbonJoe Osuch is a 85 y.o. male.   Level 5 caveat for altered mental status  HPI EMS reports that they were called to patient's nursing facility because he was trying to climb out of a chair and he had a syncopal episode that lasted about 10 minutes.  They were told that he is normally is alert and talkative although he can be confused.  They state that her blood pressure was 90/55 with heart rate of 38-42, pulse ox was 98%.  CBG was 163.  Patient does not respond to verbal commands and does not respond verbally.  PCP Toma DeitersHasanaj, Xaje A, MD   Patient is DO NOT RESUSCITATE   Past Medical History:  Diagnosis Date  . Alzheimer disease (HCC)   . DVT (deep venous thrombosis) (HCC)   . Malnourished (HCC)     There are no active problems to display for this patient.   Past Surgical History:  Procedure Laterality Date  . APPENDECTOMY    . BALLOON DILATION N/A 01/15/2018   Procedure: BALLOON DILATION;  Surgeon: Malissa Hippoehman, Najeeb U, MD;  [facility];  Service: Endoscopy;  Laterality: N/A;  . ERCP N/A 01/15/2018   Procedure: ENDOSCOPIC RETROGRADE CHOLANGIOPANCREATOGRAPHY (ERCP);  Surgeon: Malissa Hippoehman, Najeeb U, MD;  [facility];  Service: Endoscopy;  Laterality: N/A;  . REMOVAL OF STONES N/A 01/15/2018   Procedure: REMOVAL OF STONES;  Surgeon: Malissa Hippoehman, Najeeb U, MD;  [facility];  Service: Endoscopy;  Laterality: N/A;  . SPHINCTEROTOMY N/A 01/15/2018   Procedure: SPHINCTEROTOMY;  Surgeon: Malissa Hippoehman, Najeeb U, MD;  [facility];  Service: Endoscopy;  Laterality: N/A;  . TONSILLECTOMY          Home Medications    Prior to Admission medications   Medication Sig Start Date End Date Taking? Authorizing Provider  desmopressin (DDAVP)  0.2 MG tablet Take 0.2 mg by mouth daily.    [provider]  donepezil (ARICEPT) 10 MG tablet Take 10 mg by mouth at bedtime.    [provider]  memantine (NAMENDA) 10 MG tablet Take 10 mg by mouth 2 (two) times daily.    [provider]  OLANZapine (ZYPREXA) 2.5 MG tablet Take 2.5 mg by mouth at bedtime.    [provider]  rivaroxaban (XARELTO) 20 MG TABS tablet Take 20 mg by mouth daily with supper.    [provider]    Family History History reviewed. No pertinent family history.  Social History Social History   Tobacco Use  . Smoking status: Unknown If Ever Smoked  . Smokeless tobacco: Never Used  Substance Use Topics  . Alcohol use: Not Currently  . Drug use: Not Currently  Lives in a nursing home   Allergies   Penicillins   Review of Systems Review of Systems  Unable to perform ROS: Mental status change     Physical Exam Updated Vital Signs BP 114/62 (BP Location: Right Arm)   Pulse (!) 48   Temp (!) 96.6 F (35.9 C) (Rectal)   Resp 12   Ht 5\' 7"  (1.702 m)   Wt 54.4 kg   SpO2 (!) 88%   BMI 18.78 kg/m   Physical Exam Vitals signs and nursing note reviewed.  Constitutional:      Comments: Frail elderly man who keeps his eyes closed, he does not respond to verbal commands to open his eyes and does not communicate verbally at all.  HENT:     Head: Normocephalic and atraumatic.     Nose: Nose normal.  Eyes:     Extraocular Movements: Extraocular movements intact.     Conjunctiva/sclera: Conjunctivae normal.     Pupils: Pupils are equal, round, and reactive to light.  Neck:     Musculoskeletal: Normal range of motion.  Cardiovascular:     Rate and Rhythm: Bradycardia present.  Pulmonary:     Effort: Pulmonary effort is normal. No respiratory distress.     Breath sounds: Normal breath sounds.  Abdominal:     General: Abdomen is flat. Bowel sounds are normal.     Palpations: Abdomen is soft.   Genitourinary:    Comments: Patient is wearing a diaper Musculoskeletal:     Comments: Patient does move his arms to painful stimuli i.e. IV insertion.  He keeps his knees flexed.  Skin:    Comments: Patient skin is cool and slightly damp  Neurological:     Comments: Unable to assess  Psychiatric:     Comments: Unable to assess      ED Treatments / Results  Labs (all labs ordered are listed, but only abnormal results are displayed)  Labs pending    EKG EKG Interpretation  Date/Time:  Sunday August 08 2019 06:29:35 EST Ventricular Rate:  55 PR Interval:    QRS Duration: 82 QT Interval:  490 QTC Calculation: 468 R Axis:   16 Text Interpretation: Junctional rhythm Cannot rule out Inferior infarct , age undetermined ST & T wave abnormality, consider anterior ischemia Electrode noise No old tracing to compare Confirmed by Rolland Porter [phone]) on 08/08/2019 6:54:40 AM   Radiology Ct Head Wo Contrast  Result Date: 08/08/2019 CLINICAL DATA:  Altered level of consciousness, unexplained. EXAM: CT HEAD WITHOUT CONTRAST TECHNIQUE: Contiguous axial images were obtained from the base of the skull through the vertex without intravenous contrast. COMPARISON:  07/30/2013 FINDINGS: Brain: No evidence of acute infarction, hemorrhage, hydrocephalus, extra-axial collection or mass lesion/mass effect. There is mild diffuse low-attenuation within the subcortical and periventricular white matter compatible with chronic microvascular disease. Prominence of the CSF spaces compatible with brain atrophy. Vascular: No hyperdense vessel or unexpected calcification. Skull: Normal. Negative for fracture or focal lesion. Sinuses/Orbits: Paranasal sinuses and mastoid air cells are clear. Other: None IMPRESSION: 1. No acute intracranial abnormalities. 2. Chronic small vessel ischemic change and brain atrophy. Electronically Signed   By: Kerby Moors M.D.   On: 08/08/2019 07:05   Dg Chest Port 1 View   Result Date: 08/08/2019 CLINICAL DATA:  Syncope and bradycardia. EXAM: PORTABLE CHEST 1 VIEW COMPARISON:  01/19/2018 FINDINGS: Moderate cardiac enlargement. There is asymmetric elevation of right hemidiaphragm. No pleural effusion or edema identified. No airspace opacities. IMPRESSION: 1. No acute cardiopulmonary abnormalities. 2. Asymmetric elevation of right hemidiaphragm. 3. Cardiac enlargement. Electronically Signed   By: Kerby Moors M.D.   On: 08/08/2019 06:59    Procedures .Critical Care Performed by: Rolland Porter, MD Authorized by: Rolland Porter, MD   Critical care provider statement:    Critical care time (minutes):  32   Critical care was necessary to treat or prevent imminent or life-threatening deterioration of the following conditions:  Cardiac failure and CNS failure or compromise   Critical care was time spent personally by me on  the following activities:  Examination of patient, obtaining history from patient or surrogate, ordering and review of laboratory studies, ordering and review of radiographic studies, pulse oximetry and re-evaluation of patient's condition   (including critical care time)  Medications Ordered in ED Medications  atropine 1 MG/10ML injection 0.5 mg (0.5 mg Intravenous Given 08/08/19 0636)     Initial Impression / Assessment and Plan / ED Course  I have reviewed the triage vital signs and the nursing notes.  Pertinent labs & imaging results that were available during my care of the patient were reviewed by me and considered in my medical decision making (see chart for details).   Laboratory testing was done, CT head was done to evaluate him for his altered mental status, portable chest x-ray was done.   Patient's temperature was slightly low when he was placed on a Lawyer.  He was given atropine 0.5 mg IV.  When rechecked at 7:20 AM his heart rate was 65 and his blood pressure was 145/86.  His daughter was in the room face timing with several  other siblings.  She states she asked him something and he said no but he is not as responsive as he normally is.  I explained to her that we did not have any of his blood work back yet.  7:20 AM patient turned over to Dr. Adriana Simas at change of shift.  Final Clinical Impressions(s) / ED Diagnoses   Final diagnoses:  Syncope, unspecified syncope type  Bradycardia  Hypothermia, initial encounter    Disposition pending  Devoria Albe, MD, Concha Pyo, MD 08/08/19 [phone]

## 2019-08-08 NOTE — H&P (Signed)
History and Physical    Robert Hurst WOE:[id number] DOB: June 04, 1932 DOA: 08/08/2019  Referring MD/NP/PA: Dr. Lacinda Axon PCP: Neale Burly, MD  Patient coming from: Skilled nursing facility ([facility]).  Chief Complaint: Worsening mentation and passing out.  HPI: Robert Hurst is a 85 y.o. male with past medical history significant for Alzheimer disease with behavioral disturbances, urine incontinence, chronic kidney disease a stage IIIb and prior history of DVT (chronically using Xarelto); who was brought by EMS to the emergency department secondary to worsening mentation and passing out.  Patient at baseline is able to feed himself, follow commands and even confused able to interact with staff.  No nausea, no vomiting, no melena, no hematochezia, no complaints of shortness of breath or productive cough.  He had not been focal deficits appreciated. In the ED CT head demonstrated no acute intracranial abnormality, patient was found to be hypothermic, with elevated respiratory rate initially and having elevated lactic acid; urinalysis suggesting UTI.  Blood work also demonstrated acute on chronic renal failure.  Cultures taken, IV fluid resuscitation per sepsis protocol started, patient given Rocephin and TRH contacted to me patient for further additional management of what appears to be sepsis secondary to UTI.  Past Medical/Surgical History: Past Medical History:  Diagnosis Date  . Alzheimer disease ([place])   . DVT (deep venous thrombosis) ([place])   . Malnourished [facility])     Past Surgical History:  Procedure Laterality Date  . APPENDECTOMY    . BALLOON DILATION N/A 01/15/2018   Procedure: BALLOON DILATION;  Surgeon: Rogene Houston, MD;  Location[facility];  Service: Endoscopy;  Laterality: N/A;  . ERCP N/A 01/15/2018   Procedure: ENDOSCOPIC RETROGRADE CHOLANGIOPANCREATOGRAPHY (ERCP);  Surgeon: Rogene Houston, MD;  Location: [facility];  Service: Endoscopy;  Laterality: N/A;  .  REMOVAL OF STONES N/A 01/15/2018   Procedure: REMOVAL OF STONES;  Surgeon: Rogene Houston, MD;  Location[facility];  Service: Endoscopy;  Laterality: N/A;  . SPHINCTEROTOMY N/A 01/15/2018   Procedure: SPHINCTEROTOMY;  Surgeon: Rogene Houston, MD;  Location[facility];  Service: Endoscopy;  Laterality: N/A;  . TONSILLECTOMY      Social History:  has an unknown smoking status. He has never used smokeless tobacco. He reports previous alcohol use. He reports previous drug use.  Allergies: Allergies  Allergen Reactions  . Penicillins     Has patient had a PCN reaction causing immediate rash, facial/tongue/throat swelling, SOB or lightheadedness with hypotension: Unknown Has patient had a PCN reaction causing severe rash involving mucus membranes or skin necrosis: Unknown Has patient had a PCN reaction that required hospitalization: Unknown Has patient had a PCN reaction occurring within the last 10 years: Unknown If all of the above answers are "NO", then may proceed with Cephalosporin use.     Family History:  Unable to properly review at this moment secondary to patient encephalopathy; pulmonary coarse no family history reported.  Prior to Admission medications   Medication Sig Start Date End Date Taking? Authorizing Provider  desmopressin (DDAVP) 0.2 MG tablet Take 0.2 mg by mouth daily.    [provider]  donepezil (ARICEPT) 10 MG tablet Take 10 mg by mouth at bedtime.    [provider]  memantine (NAMENDA) 10 MG tablet Take 10 mg by mouth 2 (two) times daily.    [provider]  OLANZapine (ZYPREXA) 2.5 MG tablet Take 2.5 mg by mouth at bedtime.    [provider]  rivaroxaban (XARELTO) 20 MG TABS tablet Take 20 mg by mouth daily with supper.    [provider]    Review of Systems: Unable to further discuss with patient in detail secondary to acute encephalopathy in a patient with underlying dementia; obtain review from EMS  information, EDP notes and facility no other acute complaints or systems abnormalities except as otherwise mentioned in HPI.  Physical Exam: Vitals:   08/08/19 0621 08/08/19 0730 08/08/19 0900 08/08/19 0915  BP:  135/84 105/71   Pulse:  60 (!) 55 (!) 49  Resp:  (!) _0 Temp:      TempSrc:      SpO2:  100% 100% 100%  Weight: 54.4 kg     Height: _1  (1.702 m)       Constitutional: in no major distress, pleasantly confused and slow to response; able to follow simple commands.  Eyes: PERRL, lids and conjunctivae normal, no icterus. ENMT: Mucous membranes are dry on exam.. Posterior pharynx clear of any exudate or lesions.  Neck: normal, supple, no masses, no thyromegaly, no JVD. Respiratory: clear to auscultation bilaterally, no wheezing, no crackles. Normal respiratory effort. No accessory muscle use.  Cardiovascular: Regular rate and rhythm, no murmurs / rubs / gallops. No extremity edema. 2+ pedal pulses. No carotid bruits.  Abdomen: no tenderness, no masses palpated. No hepatosplenomegaly. Bowel sounds positive.  Musculoskeletal: no clubbing / cyanosis. No joint deformity upper and lower extremities. Good ROM, no contractures. Normal muscle tone.  Skin: no rashes, lesions, ulcers. No induration Neurologic: CN 2-12 grossly intact. Sensation intact, DTR normal. Moving 4 limbs spontaneously. Psychiatric: Impaired judgment and insight with underlying dementia and current encephalopathy.   Labs on Admission: I have personally reviewed the following labs and imaging studies  CBC: Recent Labs  Lab 08/08/19 0858  WBC 8.1  NEUTROABS 6.6  HGB 15.7  HCT 47.7  MCV 90.9  PLT 584*   Basic Metabolic Panel: Recent Labs  Lab 08/08/19 0858  NA 144  K 3.7  CL 107  CO2 26  GLUCOSE 111*  BUN 11  CREATININE 1.49*  CALCIUM 9.1   GFR: Estimated Creatinine Clearance: 27.9 mL/min (A) (by C-G formula based on SCr of 1.49 mg/dL (H)).   Liver Function Tests: Recent Labs  Lab  08/08/19 0858  AST 21  ALT 10  ALKPHOS 75  BILITOT 0.8  PROT 7.5  ALBUMIN 3.8   Thyroid Function Tests: Recent Labs    08/08/19 0858  TSH 7.216*   Urine analysis:    Component Value Date/Time   COLORURINE YELLOW 08/08/2019 0858   APPEARANCEUR HAZY (A) 08/08/2019 0858   LABSPEC 1.006 08/08/2019 0858   PHURINE 6.0 08/08/2019 0858   GLUCOSEU NEGATIVE 08/08/2019 0858   HGBUR MODERATE (A) 08/08/2019 0858   BILIRUBINUR NEGATIVE 08/08/2019 0858   [place] 08/08/2019 0858   PROTEINUR 30 (A) 08/08/2019 0858   NITRITE NEGATIVE 08/08/2019 0858   LEUKOCYTESUR LARGE (A) 08/08/2019 0858   Radiological Exams on Admission: Ct Head Wo Contrast  Result Date: 08/08/2019 CLINICAL DATA:  Altered level of consciousness, unexplained. EXAM: CT HEAD WITHOUT CONTRAST TECHNIQUE: Contiguous axial images were obtained from the base of the skull through the vertex without intravenous contrast. COMPARISON:  07/30/2013 FINDINGS: Brain: No evidence of acute infarction, hemorrhage, hydrocephalus, extra-axial collection or mass lesion/mass effect. There is mild diffuse low-attenuation within the subcortical and periventricular white matter compatible with chronic microvascular disease. Prominence of the CSF spaces compatible with brain atrophy. Vascular:  No hyperdense vessel or unexpected calcification. Skull: Normal. Negative for fracture or focal lesion. Sinuses/Orbits: Paranasal sinuses and mastoid air cells are clear. Other: None IMPRESSION: 1. No acute intracranial abnormalities. 2. Chronic small vessel ischemic change and brain atrophy. Electronically Signed   By: Kerby Moors M.D.   On: 08/08/2019 07:05   Dg Chest Port 1 View  Result Date: 08/08/2019 CLINICAL DATA:  Syncope and bradycardia. EXAM: PORTABLE CHEST 1 VIEW COMPARISON:  01/19/2018 FINDINGS: Moderate cardiac enlargement. There is asymmetric elevation of right hemidiaphragm. No pleural effusion or edema identified. No airspace  opacities. IMPRESSION: 1. No acute cardiopulmonary abnormalities. 2. Asymmetric elevation of right hemidiaphragm. 3. Cardiac enlargement. Electronically Signed   By: Kerby Moors M.D.   On: 08/08/2019 06:59    EKG: Independently reviewed. Bradycardia seen. No ischemic changes.  Assessment/Plan 1-Sepsis secondary to UTI ([place]) -started on IVF's as per sepsis protocol -continue IV antibiotics -follow cx results  -continue supportive care -patient met sepsis criteria on admission with worsening mentation, worsening renal function, hypothermia, elevated RR and presumed UTI as per abnormal UA.  2-lactic acidosis  -in the setting of dehydration and sepsis -continue IVF's -follow trend   3-acute on chronic renal failure: -stage 3b at baseline -minimize/avoid nephrotoxic agents  -continue treatment for UTI -follow renal function trend   4-hx of DVT -will use heparin initially in the setting of worsening Cr  5-dementia with behavioral disturbances. -constant reorientation -supportive care and continue use of namenda -for behavioral disturbances will continue zyprexa.  6-syncope/bradycardia -atropine given in ED -will monitor on telemetry  -stop aricept -checking TSH and cortisol level  DVT prophylaxis: heparin  Code Status: DNR Family Communication: no family at bedside. Disposition Plan: return to SNF when mentation and acute UTI stabilized. Consults called: none  Admission status: inpatient, telemetry bed; LOS > 2 midnights.    Time Spent: 70 minutes.  Barton Dubois MD [facility] Pager [phone]  08/08/2019, 10:26 AM

## 2019-08-08 NOTE — ED Provider Notes (Signed)
Lactate elevated.  Urinalysis shows evidence of infection.  Code sepsis initially delayed secondary to uncertainty of condition.  IV fluids and IV Rocephin ordered.  Urine culture.  Will admit.   Nat Christen, MD 08/08/19 [phone]

## 2019-08-08 NOTE — Progress Notes (Signed)
Telemetry called stated patient having pauses up 2.17 seconds, paged Midlevel awaiting response.

## 2019-08-08 NOTE — Progress Notes (Signed)
Bladder scanned the patient to see if patient was still retaining urine, only 60 mL was in the bladder. Will pass info on to night shift RN.

## 2019-08-08 NOTE — ED Triage Notes (Signed)
Patient came in by [facility] EMS for syncopal episode. Patient was at the [facility] and had a syncopal episode. Staff stated that patient was unresponsive for 10 minutes. EMS states CBG was 164. Patient is sinus brady at a rate of 42. Patient does have a DNR in place.

## 2019-08-08 NOTE — ED Notes (Signed)
Date and time results received: 08/08/19 0957 (use smartphrase ".now" to insert current time)  Test: lactic Critical Value: 3.1  Name of Provider Notified: Lacinda Axon, MD  Orders Received? Or Actions Taken?:

## 2019-08-08 NOTE — Progress Notes (Signed)
MD made aware of HR which is causing the patients MEWS score to be red. Will continue to monitor and update as needed.

## 2019-08-08 NOTE — Progress Notes (Signed)
CRITICAL VALUE ALERT  Critical Value:  Lactic 2.8  Date & Time Notied:  08/08/19 10:35 PM   Provider Notified: midlevel  Orders Received/Actions taken: none at this time

## 2019-08-09 DIAGNOSIS — F0280 Dementia in other diseases classified elsewhere without behavioral disturbance: Secondary | ICD-10-CM

## 2019-08-09 LAB — BASIC METABOLIC PANEL
Anion gap: 10 (ref 5–15)
BUN: 10 mg/dL (ref 8–23)
CO2: 23 mmol/L (ref 22–32)
Calcium: 8.5 mg/dL — ABNORMAL LOW (ref 8.9–10.3)
Chloride: 113 mmol/L — ABNORMAL HIGH (ref 98–111)
Creatinine, Ser: 1.22 mg/dL (ref 0.61–1.24)
GFR calc Af Amer: >60 mL/min (ref >60)
GFR calc non Af Amer: 54 mL/min — ABNORMAL LOW (ref >60)
Glucose, Bld: 80 mg/dL (ref 70–99)
Potassium: 3.6 mmol/L (ref 3.5–5.1)
Sodium: 146 mmol/L — ABNORMAL HIGH (ref 135–145)

## 2019-08-09 LAB — CBC
HCT: 38.7 % — ABNORMAL LOW (ref 39.0–52.0)
Hemoglobin: 12.8 g/dL — ABNORMAL LOW (ref 13.0–17.0)
MCH: 30.2 pg (ref 26.0–34.0)
MCHC: 33.1 g/dL (ref 30.0–36.0)
MCV: 91.3 fL (ref 80.0–100.0)
Platelets: 123 10*3/uL — ABNORMAL LOW (ref 150–400)
RBC: 4.24 MIL/uL (ref 4.22–5.81)
RDW: 14.6 % (ref 11.5–15.5)
WBC: 4.6 10*3/uL (ref 4.0–10.5)
nRBC: 0 % (ref 0.0–0.2)

## 2019-08-09 MED ORDER — CHLORHEXIDINE GLUCONATE CLOTH 2 % EX PADS
6.0000 | MEDICATED_PAD | Freq: Every day | CUTANEOUS | Status: DC
  Administered 2019-08-10 – 2019-08-12 (×3): 6 via TOPICAL

## 2019-08-09 NOTE — NC FL2 (Signed)
MEDICAID FL2 LEVEL OF CARE SCREENING TOOL     IDENTIFICATION  Patient Name: Robert Hurst Birthdate: Oct 25, 1931 Sex: male Admission Date (Current Location): 08/08/2019  [facility] and [place] Number:  [employer] and Address:  [facility]  [address], [place] [address]      Provider Number: [id number]  Attending Physician Name and Address:  Vassie Loll, MD  Relative Name and Phone Number:       Current Level of Care: Hospital Recommended Level of Care: Skilled Nursing Facility Prior Approval Number:    Date Approved/Denied:   PASRR Number:    Discharge Plan: SNF    Current Diagnoses: Patient Active Problem List   Diagnosis Date Noted  . Sepsis secondary to UTI (HCC) 08/08/2019  . Dementia without behavioral disturbance ([facility]) 08/08/2019  . Acute renal failure superimposed on stage 3b chronic kidney disease (H[facility]) 08/08/2019  . Lactic acidosis 08/08/2019  . Incontinence 08/08/2019    Orientation RESPIRATION BLADDER Height & Weight        Normal Incontinent Weight: 114 lb 6.7 oz (51.9 kg) Height:  5\' 8"  (172.7 cm)  BEHAVIORAL SYMPTOMS/MOOD NEUROLOGICAL BOWEL NUTRITION STATUS      Incontinent Diet(see discharge summary)  AMBULATORY STATUS COMMUNICATION OF NEEDS Skin   Extensive Assist Verbally Normal                       Personal Care Assistance Level of Assistance  Bathing, Dressing, Feeding Bathing Assistance: Maximum assistance Feeding assistance: Limited assistance Dressing Assistance: Maximum assistance     Functional Limitations Info             SPECIAL CARE FACTORS FREQUENCY                       Contractures Contractures Info: Not present    Additional Factors Info  Code Status, Allergies, Psychotropic Code Status Info: DNR Allergies Info: Penicillins Psychotropic Info: Zyprexa         Current Medications (08/09/2019):  This is the current hospital active medication list Current  Facility-Administered Medications  Medication Dose Route Frequency Provider Last Rate Last Dose  . 0.9 %  sodium chloride infusion   Intravenous Once 08/11/2019, MD      . 0.9 %  sodium chloride infusion   Intravenous Continuous Vassie Loll, MD 75 mL/hr at 08/09/19 0048    . acetaminophen (TYLENOL) tablet 650 mg  650 mg Oral Q6H PRN 08/11/19, MD       Or  . acetaminophen (TYLENOL) suppository 650 mg  650 mg Rectal Q6H PRN Vassie Loll, MD      . cefTRIAXone (ROCEPHIN) 1 g in sodium chloride 0.9 % 100 mL IVPB  1 g Intravenous Q24H Vassie Loll, MD 200 mL/hr at 08/09/19 0951 1 g at 08/09/19 0951  . heparin injection 5,000 Units  5,000 Units Subcutaneous Q8H 08/11/19, MD   5,000 Units at 08/09/19 0602  . levothyroxine (SYNTHROID) tablet 25 mcg  25 mcg Oral Q0600 0603, MD      . memantine [facility]) tablet 10 mg  10 mg Oral BID [facility], MD   10 mg at 08/09/19 0955  . OLANZapine (ZYPREXA) tablet 2.5 mg  2.5 mg Oral QHS 08/11/19, MD      . ondansetron [facility]) tablet 4 mg  4 mg Oral Q6H PRN [facility], MD       Or  . ondansetron [facility]) injection 4 mg  4 mg Intravenous Q6H PRN Barton Dubois, MD         Discharge Medications: Please see discharge summary for a list of discharge medications.  Relevant Imaging Results:  Relevant Lab Results:   Additional Information    Isola Mehlman, Clydene Pugh, LCSW

## 2019-08-09 NOTE — Progress Notes (Signed)
CRITICAL VALUE ALERT  Critical Value:  Gram pos cocci gram stain  Date & Time Notied:  7:24 AM   Provider Notified: mader  Orders Received/Actions taken: none

## 2019-08-09 NOTE — Progress Notes (Signed)
Patient had greater than 250 ml in bladder, bloody in appearance. Notified Midlevel. Order received to insert foley d/t acute urinary retention. Foley placed SWOT nurse Thayer Headings assisted using sterile technique. 250 ml returned upon placement.

## 2019-08-09 NOTE — Progress Notes (Addendum)
PROGRESS NOTE    Robert Hurst  TKZ:[id number] DOB: [birth year] DOA: 08/08/2019 PCP: [name], MD     Brief Narrative:  85 y.o. male with past medical history significant for Alzheimer disease with behavioral disturbances, urine incontinence, chronic kidney disease a stage IIIb and prior history of DVT (chronically using Xarelto); who was brought by EMS to the emergency department secondary to worsening mentation and passing out.  Patient at baseline is able to feed himself, follow commands and even confused able to interact with staff.  No nausea, no vomiting, no melena, no hematochezia, no complaints of shortness of breath or productive cough.  He had not been focal deficits appreciated. In the ED CT head demonstrated no acute intracranial abnormality, patient was found to be hypothermic, with elevated respiratory rate initially and having elevated lactic acid; urinalysis suggesting UTI.  Blood work also demonstrated acute on chronic renal failure.  Cultures taken, IV fluid resuscitation per sepsis protocol started, patient given Rocephin and TRH contacted to me patient for further additional management of what appears to be sepsis secondary to UTI.  Assessment & Plan: 1-sepsis secondary to UTI (HCC) -Stable and improved -Continue IV fluids and IV antibiotics -Follow clinical response. -Follow culture results. -Renal function has appropriately improved and back to baseline now.  2-Dementia without behavioral disturbance (HCC) -Continue supportive care -Continue Namenda -Continue Zyprexa.  3-bradycardia -No using beta-blockers or calcium channel blockers. -Electrolytes stable -Aricept discontinued -Continue monitoring on telemetry. -TSH in normal and appropriately supplemented/repletion started.  4-hypothyroidism -Continue Synthroid -Repeat thyroid panel in 4 weeks.  5-Lactic acidosis -In the setting of sepsis from UTI -improved with IV fluids -continue supportive care and  current treatment.  6-positive blood culture -1 out of 4 gram-negative cocci appreciated on blood cultures. -Patient clinically improving -Presumed contaminant. -Continue to follow culture/sensitivity and speciation results. -Continue current antibiotics.  DVT prophylaxis: Heparin Code Status: DNR/DNI. Family Communication: No family at bedside. Disposition Plan: Continue IV antibiotics, follow culture results, anticipate discharge back to skilled nursing facility in the next 48 hours, if he remains medically stable..  Consultants:   None  Procedures:   See below for x-ray reports.  Antimicrobials:  Anti-infectives (From admission, onward)   Start     Dose/Rate Route Frequency Ordered Stop   08/09/19 1000  cefTRIAXone (ROCEPHIN) 1 g in sodium chloride 0.9 % 100 mL IVPB     1 g 200 mL/hr over 30 Minutes Intravenous Every 24 hours 08/08/19 1728     08/08/19 1000  cefTRIAXone (ROCEPHIN) 1 g in sodium chloride 0.9 % 100 mL IVPB     1 g 200 mL/hr over 30 Minutes Intravenous  Once 08/08/19 0950 08/08/19 1100       Subjective: Currently afebrile, pleasantly confused, in no major distress.  No nausea vomiting.  Objective: Vitals:   08/08/19 2232 08/09/19 0630 08/09/19 0842 08/09/19 1423  BP: 125/65 110/60 (!) 116/53 119/60  Pulse: (!) 52 (!) 41 (!) 44 68  Resp: 16 18 13 14   Temp: 97.9 F (36.6 C) 97.8 F (36.6 C)  (!) 97.5 F (36.4 C)  TempSrc: Oral Oral    SpO2: 99% 93% 99% 99%  Weight:      Height:        Intake/Output Summary (Last 24 hours) at 08/09/2019 1555 Last data filed at 08/09/2019 0900 Gross per 24 hour  Intake 1925 ml  Output 1350 ml  Net 575 ml   Filed Weights   08/08/19 0621 08/08/19 1122  Weight: 54.4  kg 51.9 kg    Examination: General exam: Alert, awake, oriented x 1; more interactive and in no acute distress.  Currently afebrile.  No nausea or vomiting. Respiratory system: No using accessory muscles, good O2 sat on room air.  Respiratory  effort normal. Cardiovascular system: Sinus bradycardia, no rubs, no gallops, no JVD on exam. Gastrointestinal system: Abdomen is nondistended, soft and nontender. No organomegaly or masses felt. Normal bowel sounds heard. Central nervous system: Alert and oriented. No focal neurological deficits. Extremities: No cyanosis or clubbing. Skin: No rashes, no petechiae. Psychiatry: Mood & affect appropriate.     Data Reviewed: I have personally reviewed following labs and imaging studies  CBC: Recent Labs  Lab 08/08/19 0858 08/09/19 0519  WBC 8.1 4.6  NEUTROABS 6.6  --   HGB 15.7 12.8*  HCT 47.7 38.7*  MCV 90.9 91.3  PLT 148* 458*   Basic Metabolic Panel: Recent Labs  Lab 08/08/19 0858 08/09/19 0519  NA 144 146*  K 3.7 3.6  CL 107 113*  CO2 26 23  GLUCOSE 111* 80  BUN 11 10  CREATININE 1.49* 1.22  CALCIUM 9.1 8.5*   GFR: Estimated Creatinine Clearance: 32.5 mL/min (by C-G formula based on SCr of 1.22 mg/dL).   Liver Function Tests: Recent Labs  Lab 08/08/19 0858  AST 21  ALT 10  ALKPHOS 75  BILITOT 0.8  PROT 7.5  ALBUMIN 3.8   Thyroid Function Tests: Recent Labs    08/08/19 0858  TSH 7.216*   Urine analysis:    Component Value Date/Time   COLORURINE YELLOW 08/08/2019 0858   APPEARANCEUR HAZY (A) 08/08/2019 0858   LABSPEC 1.006 08/08/2019 0858   PHURINE 6.0 08/08/2019 0858   GLUCOSEU NEGATIVE 08/08/2019 0858   HGBUR MODERATE (A) 08/08/2019 0858   BILIRUBINUR NEGATIVE 08/08/2019 0858   KETONESUR NEGATIVE 08/08/2019 0858   PROTEINUR 30 (A) 08/08/2019 0858   NITRITE NEGATIVE 08/08/2019 0858   LEUKOCYTESUR LARGE (A) 08/08/2019 0858    Recent Results (from the past 240 hour(s))  SARS CORONAVIRUS 2 (TAT 6-24 HRS) Nasopharyngeal Nasopharyngeal Swab     Status: None   Collection Time: 08/08/19  6:22 AM   Specimen: Nasopharyngeal Swab  Result Value Ref Range Status   SARS Coronavirus 2 NEGATIVE NEGATIVE Final    Comment: (NOTE) SARS-CoV-2 target  nucleic acids are NOT DETECTED. The SARS-CoV-2 RNA is generally detectable in upper and lower respiratory specimens during the acute phase of infection. Negative results do not preclude SARS-CoV-2 infection, do not rule out co-infections with other pathogens, and should not be used as the sole basis for treatment or other patient management decisions. Negative results must be combined with clinical observations, patient history, and epidemiological information. The expected result is Negative. Fact Sheet for Patients: SugarRoll.be Fact Sheet for Healthcare Providers: https://www.woods-mathews.com/ This test is not yet approved or cleared by the [place] FDA and  has been authorized for detection and/or diagnosis of SARS-CoV-2 by FDA under an Emergency Use Authorization (EUA). This EUA will remain  in effect (meaning this test can be used) for the duration of the COVID-19 declaration under Section 56 4(b)(1) of the Act, 21 U.S.C. section 360bbb-3(b)(1), unless the authorization is terminated or revoked sooner. Performed at [facility], [place] [address], [place], [place] [address]   Culture, blood (routine x 2)     Status: None (Preliminary result)   Collection Time: 08/08/19  8:58 AM   Specimen: BLOOD RIGHT HAND  Result Value Ref Range Status  Specimen Description   Final    BLOOD RIGHT HAND BOTTLES DRAWN AEROBIC AND ANAEROBIC Performed at [facility], [address] [place], [place] [address]    Special Requests   Final    Blood Culture adequate volume Performed at [facility], [address] [place], [place] [address]    Culture  Setup Time   Final    GRAM POSITIVE COCCI AEROBIC BOTTLE ONLY Gram Stain Report Called to,Read Back By and Verified With: HEATHER E.,RN @0657  08/09/2019 KAY Performed at [facility], [address] [place], [place] [address]    Culture   Final    CULTURE REINCUBATED FOR BETTER GROWTH Performed  at [facility], [address] [place], [place] [address]    Report Status PENDING  Incomplete  Culture, blood (routine x 2)     Status: None (Preliminary result)   Collection Time: 08/08/19  9:01 AM   Specimen: Left Antecubital; Blood  Result Value Ref Range Status   Specimen Description   Final    LEFT ANTECUBITAL BOTTLES DRAWN AEROBIC AND ANAEROBIC   Special Requests Blood Culture adequate volume  Final   Culture   Final    NO GROWTH < 24 HOURS Performed at [facility], [address] [place], [place] [address]    Report Status PENDING  Incomplete     Radiology Studies: Ct Head Wo Contrast  Result Date: 08/08/2019 CLINICAL DATA:  Altered level of consciousness, unexplained. EXAM: CT HEAD WITHOUT CONTRAST TECHNIQUE: Contiguous axial images were obtained from the base of the skull through the vertex without intravenous contrast. COMPARISON:  07/30/2013 FINDINGS: Brain: No evidence of acute infarction, hemorrhage, hydrocephalus, extra-axial collection or mass lesion/mass effect. There is mild diffuse low-attenuation within the subcortical and periventricular white matter compatible with chronic microvascular disease. Prominence of the CSF spaces compatible with brain atrophy. Vascular: No hyperdense vessel or unexpected calcification. Skull: Normal. Negative for fracture or focal lesion. Sinuses/Orbits: Paranasal sinuses and mastoid air cells are clear. Other: None IMPRESSION: 1. No acute intracranial abnormalities. 2. Chronic small vessel ischemic change and brain atrophy. Electronically Signed   By: Signa Kellaylor  Stroud M.D.   On: 08/08/2019 07:05   Dg Chest Port 1 View  Result Date: 08/08/2019 CLINICAL DATA:  Syncope and bradycardia. EXAM: PORTABLE CHEST 1 VIEW COMPARISON:  01/19/2018 FINDINGS: Moderate cardiac enlargement. There is asymmetric elevation of right hemidiaphragm. No pleural effusion or edema identified. No airspace opacities. IMPRESSION: 1. No acute cardiopulmonary  abnormalities. 2. Asymmetric elevation of right hemidiaphragm. 3. Cardiac enlargement. Electronically Signed   By: Signa Kellaylor  Stroud M.D.   On: 08/08/2019 06:59    Scheduled Meds: . heparin injection (subcutaneous)  5,000 Units Subcutaneous Q8H  . levothyroxine  25 mcg Oral Q0600  . memantine  10 mg Oral BID  . OLANZapine  2.5 mg Oral QHS   Continuous Infusions: . sodium chloride    . sodium chloride 75 mL/hr at 08/09/19 0048  . cefTRIAXone (ROCEPHIN)  IV 1 g (08/09/19 0951)     LOS: 1 day    Time spent: 30 minutes.    Vassie Lollarlos Gabriel Conry, MD [facility] Pager [phone]  If 7PM-7AM, please contact night-coverage [email or web address] Password [email or web address] 08/09/2019, 3:55 PM

## 2019-08-10 LAB — GLUCOSE, CAPILLARY: Glucose-Capillary: 118 mg/dL — ABNORMAL HIGH (ref 70–99)

## 2019-08-10 LAB — CULTURE, BLOOD (ROUTINE X 2): Special Requests: ADEQUATE

## 2019-08-10 MED ORDER — LEVOFLOXACIN 500 MG PO TABS
250.0000 mg | ORAL_TABLET | Freq: Every day | ORAL | Status: DC
  Administered 2019-08-10: 250 mg via ORAL
  Filled 2019-08-10: qty 1

## 2019-08-10 NOTE — Progress Notes (Addendum)
PROGRESS NOTE    Robert Hurst  OZD:[id number] DOB: 1932/01/03 DOA: 08/08/2019 PCP: Neale Burly, MD    Brief Narrative:  85 y.o. male with past medical history significant for Alzheimer disease with behavioral disturbances, urine incontinence, chronic kidney disease a stage IIIb and prior history of DVT (chronically using Xarelto); who was brought by EMS to the emergency department secondary to worsening mentation and passing out.  Patient at baseline is able to feed himself, follow commands and even confused able to interact with staff.  No nausea, no vomiting, no melena, no hematochezia, no complaints of shortness of breath or productive cough.  He had not been focal deficits appreciated. In the ED CT head demonstrated no acute intracranial abnormality, patient was found to be hypothermic, with elevated respiratory rate initially and having elevated lactic acid; urinalysis suggesting UTI.  Blood work also demonstrated acute on chronic renal failure.  Cultures taken, IV fluid resuscitation per sepsis protocol started, patient given Rocephin and TRH contacted to me patient for further additional management of what appears to be sepsis secondary to UTI.  Assessment & Plan: 1-sepsis secondary to Enterococcus faecalis UTI (HCC) -Stable and improved -Following cultures and sensitivity antibiotics will be transition to Levaquin -Will assure tolerance as he is present so nausea today.  Patient is afebrile and otherwise in no acute distress. -Continue supportive care. -Renal function has appropriately improved and back to baseline now.  2-Dementia with behavioral disturbance ([place]) -Continue supportive care -Continue Namenda -Continue Zyprexa.  3-bradycardia -No using beta-blockers or calcium channel blockers. -Electrolytes stable -Aricept discontinued -Continue monitoring on telemetry. -TSH abnormal and appropriately supplemented/repletion started.  4-hypothyroidism -Continue Synthroid  -Repeat thyroid panel in 4 weeks.  5-Lactic acidosis -In the setting of sepsis from UTI -improved with IV fluids -continue supportive care and current treatment.  6-positive blood culture -1 out of 4 gram-negative cocci appreciated on blood cultures. -Patient clinically improving -Presumed contaminant. -Continue to follow final culture/sensitivity and speciation results. -Continue current antibiotics.  7-acute metabolic encephalopathy -in the setting of acute UTI on top of dementia -improving as treatment for infection provided  -continue supportive care and constant reorientation.   DVT prophylaxis: Heparin Code Status: DNR/DNI. Family Communication: No family at bedside. Disposition Plan: Continue antibiotics, but based on sensitivity transition to oral regimen and assure tolerance before discharge.  Hopefully back to skilled nursing facility on 08/11/2019.    Consultants:   Dr. Bronson Ing curbside (not a candidate for pace maker). Continue supportive care and discontinue aricept.  Procedures:   See below for x-ray reports.  Antimicrobials:  Anti-infectives (From admission, onward)   Start     Dose/Rate Route Frequency Ordered Stop   08/10/19 1600  levofloxacin (LEVAQUIN) tablet 250 mg     250 mg Oral Daily 08/10/19 1537     08/09/19 1000  cefTRIAXone (ROCEPHIN) 1 g in sodium chloride 0.9 % 100 mL IVPB  Status:  Discontinued     1 g 200 mL/hr over 30 Minutes Intravenous Every 24 hours 08/08/19 1728 08/10/19 1537   08/08/19 1000  cefTRIAXone (ROCEPHIN) 1 g in sodium chloride 0.9 % 100 mL IVPB     1 g 200 mL/hr over 30 Minutes Intravenous  Once 08/08/19 0950 08/08/19 1100       Subjective: No fever, pleasantly confused; denying chest pain.  Expressed some nausea but no vomiting.  Heart rate has remained stable in the 40s to 60s range.  Objective: Vitals:   08/09/19 1423 08/09/19 2145 08/10/19 0650 08/10/19 1406  BP: 119/60 126/67 (!) 153/74 (!) 142/68  Pulse: 68  (!) 46 (!) 41 (!) 50  Resp: 14 18 20 18   Temp: (!) 97.5 F (36.4 C) 98.7 F (37.1 C) 97.6 F (36.4 C) 98.5 F (36.9 C)  TempSrc:  Oral Oral Oral  SpO2: 99% 98% 99% 98%  Weight:      Height:        Intake/Output Summary (Last 24 hours) at 08/10/2019 1600 Last data filed at 08/10/2019 1300 Gross per 24 hour  Intake 2992 ml  Output 500 ml  Net 2492 ml   Filed Weights   08/08/19 0621 08/08/19 1122  Weight: 54.4 kg 51.9 kg    Examination: General exam: Alert, awake, oriented x 1; in no acute distress.  Denies chest pain, no nausea or vomiting.  Currently afebrile.  Expressed some nausea, no vomiting. Respiratory system: Clear to auscultation.  No using accessory muscles.  Respiratory effort normal. Cardiovascular system: S1 and S2; sinus bradycardia by telemetry evaluation.  No rubs or gallops. Gastrointestinal system: Abdomen is nondistended, soft and nontender. No organomegaly or masses felt. Normal bowel sounds heard. Central nervous system: No focal neurological deficits. Extremities: No no cyanosis or clubbing. Skin: No rashes, no petechiae. Psychiatry: Mood & affect appropriate.    Data Reviewed: I have personally reviewed following labs and imaging studies  CBC: Recent Labs  Lab 08/08/19 0858 08/09/19 0519  WBC 8.1 4.6  NEUTROABS 6.6  --   HGB 15.7 12.8*  HCT 47.7 38.7*  MCV 90.9 91.3  PLT 148* 123*   Basic Metabolic Panel: Recent Labs  Lab 08/08/19 0858 08/09/19 0519  NA 144 146*  K 3.7 3.6  CL 107 113*  CO2 26 23  GLUCOSE 111* 80  BUN 11 10  CREATININE 1.49* 1.22  CALCIUM 9.1 8.5*   GFR: Estimated Creatinine Clearance: 32.5 mL/min (by C-G formula based on SCr of 1.22 mg/dL).   Liver Function Tests: Recent Labs  Lab 08/08/19 0858  AST 21  ALT 10  ALKPHOS 75  BILITOT 0.8  PROT 7.5  ALBUMIN 3.8   Thyroid Function Tests: Recent Labs    08/08/19 0858  TSH 7.216*   Urine analysis:    Component Value Date/Time   COLORURINE YELLOW  08/08/2019 0858   APPEARANCEUR HAZY (A) 08/08/2019 0858   LABSPEC 1.006 08/08/2019 0858   PHURINE 6.0 08/08/2019 0858   GLUCOSEU NEGATIVE 08/08/2019 0858   HGBUR MODERATE (A) 08/08/2019 0858   BILIRUBINUR NEGATIVE 08/08/2019 0858   KETONESUR NEGATIVE 08/08/2019 0858   PROTEINUR 30 (A) 08/08/2019 0858   NITRITE NEGATIVE 08/08/2019 0858   LEUKOCYTESUR LARGE (A) 08/08/2019 0858    Recent Results (from the past 240 hour(s))  SARS CORONAVIRUS 2 (TAT 6-24 HRS) Nasopharyngeal Nasopharyngeal Swab     Status: None   Collection Time: 08/08/19  6:22 AM   Specimen: Nasopharyngeal Swab  Result Value Ref Range Status   SARS Coronavirus 2 NEGATIVE NEGATIVE Final    Comment: (NOTE) SARS-CoV-2 target nucleic acids are NOT DETECTED. The SARS-CoV-2 RNA is generally detectable in upper and lower respiratory specimens during the acute phase of infection. Negative results do not preclude SARS-CoV-2 infection, do not rule out co-infections with other pathogens, and should not be used as the sole basis for treatment or other patient management decisions. Negative results must be combined with clinical observations, patient history, and epidemiological information. The expected result is Negative. Fact Sheet for Patients: HairSlick.nohttps://www.fda.gov/media/138098/download Fact Sheet for Healthcare Providers: quierodirigir.comhttps://www.fda.gov/media/138095/download This test is not  yet approved or cleared by the [place] and  has been authorized for detection and/or diagnosis of SARS-CoV-2 by FDA under an Emergency Use Authorization (EUA). This EUA will remain  in effect (meaning this test can be used) for the duration of the COVID-19 declaration under Section 56 4(b)(1) of the Act, 21 U.S.C. section 360bbb-3(b)(1), unless the authorization is terminated or revoked sooner. Performed at [facility], [address] [place], [place] [address]   Culture, blood (routine x 2)     Status: Abnormal   Collection  Time: 08/08/19  8:58 AM   Specimen: BLOOD RIGHT HAND  Result Value Ref Range Status   Specimen Description   Final    BLOOD RIGHT HAND BOTTLES DRAWN AEROBIC AND ANAEROBIC Performed at [facility], [address] [place], [place] [address]    Special Requests   Final    Blood Culture adequate volume Performed at [facility], [address], [place], [place] [address]    Culture  Setup Time   Final    GRAM POSITIVE COCCI AEROBIC BOTTLE ONLY Gram Stain Report Called to,Read Back By and Verified With: HEATHER E.,RN @0657  08/09/2019 KAY Performed at [facility], [address] [place], [place]    Culture (A)  Final    STAPHYLOCOCCUS SPECIES (COAGULASE NEGATIVE) THE SIGNIFICANCE OF ISOLATING THIS ORGANISM FROM A SINGLE SET OF BLOOD CULTURES WHEN MULTIPLE SETS ARE DRAWN IS UNCERTAIN. PLEASE NOTIFY THE MICROBIOLOGY DEPARTMENT WITHIN ONE WEEK IF SPECIATION AND SENSITIVITIES ARE REQUIRED. Performed at [facility], [address] [place], [place]    Report Status 08/10/2019 FINAL  Final  Culture, blood (routine x 2)     Status: None (Preliminary result)   Collection Time: 08/08/19  9:01 AM   Specimen: Left Antecubital; Blood  Result Value Ref Range Status   Specimen Description   Final    LEFT ANTECUBITAL BOTTLES DRAWN AEROBIC AND ANAEROBIC   Special Requests Blood Culture adequate volume  Final   Culture   Final    NO GROWTH 2 DAYS Performed at [facility], [address], [place], [place]    Report Status PENDING  Incomplete  Urine culture     Status: Abnormal (Preliminary result)   Collection Time: 08/08/19  5:00 PM   Specimen: Urine, Catheterized  Result Value Ref Range Status   Specimen Description   Final    URINE, CATHETERIZED Performed at [facility], [address], [place], [place]    Special Requests   Final    NONE Performed at [facility], [address], [place], [place]    Culture (A)  Final    >=100,000  COLONIES/mL ENTEROCOCCUS FAECALIS SUSCEPTIBILITIES TO FOLLOW Performed at [facility], [address], [place], [place]    Report Status PENDING  Incomplete     Radiology Studies: No results found.  Scheduled Meds: . Chlorhexidine Gluconate Cloth  6 each Topical Q0600  . heparin injection (subcutaneous)  5,000 Units Subcutaneous Q8H  . levofloxacin  250 mg Oral Daily  . levothyroxine  25 mcg Oral Q0600  . memantine  10 mg Oral BID  . OLANZapine  2.5 mg Oral QHS   Continuous Infusions: . sodium chloride    . sodium chloride 75 mL/hr (08/10/19 0632)     LOS: 2 days    Time spent: 30 minutes.    08/12/19, MD [facility] Pager [phone]  08/10/2019, 4:00 PM

## 2019-08-11 ENCOUNTER — Inpatient Hospital Stay (HOSPITAL_COMMUNITY): Payer: Medicare Other

## 2019-08-11 ENCOUNTER — Encounter (HOSPITAL_COMMUNITY): Payer: Self-pay

## 2019-08-11 DIAGNOSIS — G9341 Metabolic encephalopathy: Secondary | ICD-10-CM

## 2019-08-11 DIAGNOSIS — R55 Syncope and collapse: Secondary | ICD-10-CM

## 2019-08-11 DIAGNOSIS — A4181 Sepsis due to Enterococcus: Principal | ICD-10-CM

## 2019-08-11 LAB — URINE CULTURE: Culture: >=100000 — AB

## 2019-08-11 MED ORDER — VANCOMYCIN HCL 500 MG IV SOLR
500.0000 mg | INTRAVENOUS | Status: DC
  Administered 2019-08-12: 500 mg via INTRAVENOUS
  Filled 2019-08-11 (×2): qty 500

## 2019-08-11 MED ORDER — LEVOTHYROXINE SODIUM 75 MCG PO TABS
37.5000 ug | ORAL_TABLET | Freq: Every day | ORAL | Status: DC
  Administered 2019-08-12: 37.5 ug via ORAL
  Filled 2019-08-11: qty 1

## 2019-08-11 MED ORDER — VANCOMYCIN HCL IN DEXTROSE 1-5 GM/200ML-% IV SOLN
1000.0000 mg | Freq: Once | INTRAVENOUS | Status: AC
  Administered 2019-08-11: 1000 mg via INTRAVENOUS
  Filled 2019-08-11: qty 200

## 2019-08-11 MED ORDER — POTASSIUM CHLORIDE IN NACL 20-0.45 MEQ/L-% IV SOLN
INTRAVENOUS | Status: DC
  Administered 2019-08-11 – 2019-08-12 (×2): via INTRAVENOUS

## 2019-08-11 NOTE — Progress Notes (Addendum)
Pharmacy Antibiotic Note  Robert Hurst is a 85 y.o. male admitted on 08/08/2019 with UTI.  Pharmacy has been consulted for Vancomycin dosing.  Plan: Vancomycin 1000mg  loading dose, then 500mg  IV every 24 hours.  Goal trough 10-15 mcg/mL.  F/U cxs and clinical progress Monitor V/S, labs and levels as indicated  Height: 5\' 8"  (172.7 cm) Weight: 114 lb 6.7 oz (51.9 kg) IBW/kg (Calculated) : 68.4  Temp (24hrs), Avg:98.3 F (36.8 C), Min:97.4 F (36.3 C), Max:99 F (37.2 C)  Recent Labs  Lab 08/08/19 0858 08/08/19 0901 08/08/19 1027 08/08/19 1957 08/09/19 0519  WBC 8.1  --   --   --  4.6  CREATININE 1.49*  --   --   --  1.22  LATICACIDVEN  --  3.1* 3.3* 2.8*  --     Estimated Creatinine Clearance: 32.5 mL/min (by C-G formula based on SCr of 1.22 mg/dL).    Allergies  Allergen Reactions  . Penicillins     Has patient had a PCN reaction causing immediate rash, facial/tongue/throat swelling, SOB or lightheadedness with hypotension: Unknown Has patient had a PCN reaction causing severe rash involving mucus membranes or skin necrosis: Unknown Has patient had a PCN reaction that required hospitalization: Unknown Has patient had a PCN reaction occurring within the last 10 years: Unknown If all of the above answers are "NO", then may proceed with Cephalosporin use.     Antimicrobials this admission: Vancomycin 11/18>>  11/15 ceftriaxone>>11/17 Levaquin 11/17 >>11/18  Microbiology results: 11/15 BCX:  1 of 2 bottles + CONS 11/15 UCx: enterococcus faecalis >100,000 CFU/ml  Thank you for allowing pharmacy to be a part of this patient's care.  Isac Sarna, BS [place], [place] Clinical Pharmacist Pager [phone] 08/11/2019 7:33 AM

## 2019-08-11 NOTE — Progress Notes (Signed)
Patient getting bath at 430, do tomorrow.

## 2019-08-11 NOTE — Progress Notes (Signed)
PROGRESS NOTE  Robert Hurst LKJ:[id number] DOB: [birth year] DOA: 08/08/2019 PCP: Neale Burly, MD  Brief History:  85 y.o. male with past medical history significant for Alzheimer disease with behavioral disturbances, urine incontinence, chronic kidney disease a stage IIIb and prior history of DVT (chronically using Xarelto); who was brought by EMS to the emergency department secondary to worsening mentation and passing out.  Patient at baseline is able to feed himself, follow commands and able to interact with staff. EMS reports that they were called to patient's nursing facility because he was trying to climb out of a chair and he had a syncopal episode that lasted about 10 minutes.  They were told that he is normally is alert and talkative although he can be confused.  They state that her blood pressure was 90/55 with heart rate of 38-42, pulse ox was 98%.  CBG was 163. In the ED CT head demonstrated no acute intracranial abnormality, patient was found to be hypothermic, with elevated respiratory rate initially and having elevated lactic acid; urinalysis suggesting UTI.  Blood work also demonstrated acute on chronic renal failure.  Cultures taken, IV fluid resuscitation per sepsis protocol started, patient given Rocephin and TRH contacted  Assessment/Plan: Sepsis -present on admission -continue IV antibiotics -blood cultures--contaminant -patient met sepsis criteria on admission with worsening mentation, worsening renal function, hypothermia, elevated RR and presumed UTI as per abnormal UA.  UTI -E. Faecalis -d/c levoflox -start IV vanco -plan nitrofurantoin if renal function continues to improve  Syncope -due to hypotension from sepsis -personally reviewed tele/ekg--no AV blocks -echo -orthostatics  Acute metabolic Encephalopathy -due to sepsis -11/18--near baseline according to niece  Lactic acidosis  -in the setting of dehydration and sepsis -continue IVF's -follow  trend   acute on chronic renal failure--ckd stage 3 -baseline creatinine 1.5-1.8 -serum creatinine peaked 2.98 -minimize/avoid nephrotoxic agents  -secondary to sepsis and volume depletion  History of DVT -heparin initially in the setting of worsening Cr -restart xarelto  dementia with behavioral disturbances. -continue namenda -aricept stopped due to bradycardia  Bradycardia -atropine given in ED -HR in upper 50s and low 60s when awake -personally reviewed telemetry--no AV block -stop aricept -checking TSH-7.216 -cortisol level 22.4 -not a candidate for PPM  Hypothyroidism -TSH 7.216 -increase synthroid       Disposition Plan:   SNF11/19 or 11/20 Family Communication:   Niece updated at bedside 11/18  Consultants:    Code Status:  FULL / DNR  DVT Prophylaxis:  [place] Heparin / [place] Lovenox   Procedures: As Listed in Progress Note Above  Antibiotics: Ceftriaxone 11/15>>11/17 vanco 11/18>>>>    Total time spent 35 minutes.  Greater than 50% spent face to face counseling and coordinating care.      Subjective: Pt is pleasantly confused.  Patient denies fevers, chills, headache, chest pain, dyspnea, nausea, vomiting, diarrhea, abdominal pain,    Objective: Vitals:   08/10/19 1406 08/10/19 2115 08/11/19 0657 08/11/19 1500  BP: (!) 142/68 (!) 158/72 (!) 166/77   Pulse: (!) 50 (!) 49 (!) 48 93  Resp: '18 20 18 18  ' Temp: 98.5 F (36.9 C) 99 F (37.2 C) (!) 97.4 F (36.3 C) 98.4 F (36.9 C)  TempSrc: Oral Oral Oral Oral  SpO2: 98% 100% 99% 97%  Weight:      Height:        Intake/Output Summary (Last 24 hours) at 08/11/2019 1657 Last data filed at 08/11/2019 1500 Gross per  24 hour  Intake 1115.79 ml  Output 3500 ml  Net -2384.21 ml   Weight change:  Exam:   General:  Pt is alert, follows commands appropriately, not in acute distress  HEENT: No icterus, No thrush, No neck mass, [place]/AT  Cardiovascular: RRR, S1/S2, no rubs, no  gallops  Respiratory: CTA bilaterally, no wheezing, no crackles, no rhonchi  Abdomen: Soft/+BS, non tender, non distended, no guarding  Extremities: No edema, No lymphangitis, No petechiae, No rashes, no synovitis   Data Reviewed: I have personally reviewed following labs and imaging studies Basic Metabolic Panel: Recent Labs  Lab 08/08/19 0858 08/09/19 0519  NA 144 146*  K 3.7 3.6  CL 107 113*  CO2 26 23  GLUCOSE 111* 80  BUN 11 10  CREATININE 1.49* 1.22  CALCIUM 9.1 8.5*   Liver Function Tests: Recent Labs  Lab 08/08/19 0858  AST 21  ALT 10  ALKPHOS 75  BILITOT 0.8  PROT 7.5  ALBUMIN 3.8   No results for input(s): LIPASE, AMYLASE in the last 168 hours. No results for input(s): AMMONIA in the last 168 hours. Coagulation Profile: No results for input(s): INR, PROTIME in the last 168 hours. CBC: Recent Labs  Lab 08/08/19 0858 08/09/19 0519  WBC 8.1 4.6  NEUTROABS 6.6  --   HGB 15.7 12.8*  HCT 47.7 38.7*  MCV 90.9 91.3  PLT 148* 123*   Cardiac Enzymes: No results for input(s): CKTOTAL, CKMB, CKMBINDEX, TROPONINI in the last 168 hours. BNP: Invalid input(s): POCBNP CBG: Recent Labs  Lab 08/10/19 1548  GLUCAP 118*   HbA1C: No results for input(s): HGBA1C in the last 72 hours. Urine analysis:    Component Value Date/Time   COLORURINE YELLOW 08/08/2019 0858   APPEARANCEUR HAZY (A) 08/08/2019 0858   LABSPEC 1.006 08/08/2019 0858   PHURINE 6.0 08/08/2019 0858   GLUCOSEU NEGATIVE 08/08/2019 0858   HGBUR MODERATE (A) 08/08/2019 0858   BILIRUBINUR NEGATIVE 08/08/2019 0858   KETONESUR NEGATIVE 08/08/2019 0858   PROTEINUR 30 (A) 08/08/2019 0858   NITRITE NEGATIVE 08/08/2019 0858   LEUKOCYTESUR LARGE (A) 08/08/2019 0858   Sepsis Labs: '@[email or web address]' (procalcitonin:4,lacticidven:4) ) Recent Results (from the past 240 hour(s))  SARS CORONAVIRUS 2 (Maryjo Ragon 6-24 HRS) Nasopharyngeal Nasopharyngeal Swab     Status: None   Collection Time: 08/08/19  6:22 AM    Specimen: Nasopharyngeal Swab  Result Value Ref Range Status   SARS Coronavirus 2 NEGATIVE NEGATIVE Final    Comment: (NOTE) SARS-CoV-2 target nucleic acids are NOT DETECTED. The SARS-CoV-2 RNA is generally detectable in upper and lower respiratory specimens during the acute phase of infection. Negative results do not preclude SARS-CoV-2 infection, do not rule out co-infections with other pathogens, and should not be used as the sole basis for treatment or other patient management decisions. Negative results must be combined with clinical observations, patient history, and epidemiological information. The expected result is Negative. Fact Sheet for Patients: SugarRoll.be Fact Sheet for Healthcare Providers: https://www.woods-mathews.com/ This test is not yet approved or cleared by the [place] FDA and  has been authorized for detection and/or diagnosis of SARS-CoV-2 by FDA under an Emergency Use Authorization (EUA). This EUA will remain  in effect (meaning this test can be used) for the duration of the COVID-19 declaration under Section 56 4(b)(1) of the Act, 21 U.S.C. section 360bbb-3(b)(1), unless the authorization is terminated or revoked sooner. Performed at [facility], [place] [address], [place], [place] [address]   Culture, blood (routine x 2)  Status: Abnormal   Collection Time: 08/08/19  8:58 AM   Specimen: BLOOD RIGHT HAND  Result Value Ref Range Status   Specimen Description   Final    BLOOD RIGHT HAND BOTTLES DRAWN AEROBIC AND ANAEROBIC Performed at [facility], [address], [place], [place] [address]    Special Requests   Final    Blood Culture adequate volume Performed at [facility], [address], [place], [place] [address]    Culture  Setup Time   Final    GRAM POSITIVE COCCI AEROBIC BOTTLE ONLY Gram Stain Report Called to,Read Back By and Verified With: HEATHER E.,RN '@[id number]'  08/09/2019 KAY Performed at  [facility], [address] [place], [place] [address]    Culture (A)  Final    STAPHYLOCOCCUS SPECIES (COAGULASE NEGATIVE) THE SIGNIFICANCE OF ISOLATING THIS ORGANISM FROM A SINGLE SET OF BLOOD CULTURES WHEN MULTIPLE SETS ARE DRAWN IS UNCERTAIN. PLEASE NOTIFY THE MICROBIOLOGY DEPARTMENT WITHIN ONE WEEK IF SPECIATION AND SENSITIVITIES ARE REQUIRED. Performed at [facility], [place] [address] [place], [place] [address]    Report Status 08/10/2019 FINAL  Final  Culture, blood (routine x 2)     Status: None (Preliminary result)   Collection Time: 08/08/19  9:01 AM   Specimen: Left Antecubital; Blood  Result Value Ref Range Status   Specimen Description   Final    LEFT ANTECUBITAL BOTTLES DRAWN AEROBIC AND ANAEROBIC   Special Requests Blood Culture adequate volume  Final   Culture   Final    NO GROWTH 3 DAYS Performed at [facility], [address] [place], [place] [address]    Report Status PENDING  Incomplete  Urine culture     Status: Abnormal   Collection Time: 08/08/19  5:00 PM   Specimen: Urine, Catheterized  Result Value Ref Range Status   Specimen Description   Final    URINE, CATHETERIZED Performed at [facility], [address], [place], [place] [address]    Special Requests   Final    NONE Performed at [facility], [address], [place],  [address]    Culture >=100,000 COLONIES/mL ENTEROCOCCUS FAECALIS (A)  Final   Report Status 08/11/2019 FINAL  Final   Organism ID, Bacteria ENTEROCOCCUS FAECALIS (A)  Final      Susceptibility   Enterococcus faecalis - MIC*    AMPICILLIN <=2 SENSITIVE Sensitive     LEVOFLOXACIN 1 SENSITIVE Sensitive     NITROFURANTOIN <=16 SENSITIVE Sensitive     VANCOMYCIN 1 SENSITIVE Sensitive     * >=100,000 COLONIES/mL ENTEROCOCCUS FAECALIS     Scheduled Meds:  Chlorhexidine Gluconate Cloth  6 each Topical Q0600   heparin injection (subcutaneous)  5,000 Units Subcutaneous Q8H   levothyroxine  25 mcg Oral Q0600    memantine  10 mg Oral BID   OLANZapine  2.5 mg Oral QHS   Continuous Infusions:  0.45 % NaCl with KCl 20 mEq / L 75 mL/hr at 08/11/19 1654   sodium chloride     [START ON 08/12/2019] vancomycin      Procedures/Studies: Ct Head Wo Contrast  Result Date: 08/08/2019 CLINICAL DATA:  Altered level of consciousness, unexplained. EXAM: CT HEAD WITHOUT CONTRAST TECHNIQUE: Contiguous axial images were obtained from the base of the skull through the vertex without intravenous contrast. COMPARISON:  07/30/2013 FINDINGS: Brain: No evidence of acute infarction, hemorrhage, hydrocephalus, extra-axial collection or mass lesion/mass effect. There is mild diffuse low-attenuation within the subcortical and periventricular white matter compatible with chronic  microvascular disease. Prominence of the CSF spaces compatible with brain atrophy. Vascular: No hyperdense vessel or unexpected calcification. Skull: Normal. Negative for fracture or focal lesion. Sinuses/Orbits: Paranasal sinuses and mastoid air cells are clear. Other: None IMPRESSION: 1. No acute intracranial abnormalities. 2. Chronic small vessel ischemic change and brain atrophy. Electronically Signed   By: Kerby Moors M.D.   On: 08/08/2019 07:05   Dg Chest Port 1 View  Result Date: 08/08/2019 CLINICAL DATA:  Syncope and bradycardia. EXAM: PORTABLE CHEST 1 VIEW COMPARISON:  01/19/2018 FINDINGS: Moderate cardiac enlargement. There is asymmetric elevation of right hemidiaphragm. No pleural effusion or edema identified. No airspace opacities. IMPRESSION: 1. No acute cardiopulmonary abnormalities. 2. Asymmetric elevation of right hemidiaphragm. 3. Cardiac enlargement. Electronically Signed   By: Kerby Moors M.D.   On: 08/08/2019 06:59    Orson Eva, DO  [facility] Pager [phone]  If 7PM-7AM, please contact night-coverage [email or web address] Password [email or web address] 08/11/2019, 4:57 PM   LOS: 3 days

## 2019-08-12 ENCOUNTER — Inpatient Hospital Stay (HOSPITAL_COMMUNITY): Payer: Medicare Other

## 2019-08-12 DIAGNOSIS — Z66 Do not resuscitate: Secondary | ICD-10-CM | POA: Diagnosis present

## 2019-08-12 DIAGNOSIS — N179 Acute kidney failure, unspecified: Secondary | ICD-10-CM | POA: Diagnosis not present

## 2019-08-12 DIAGNOSIS — R55 Syncope and collapse: Secondary | ICD-10-CM

## 2019-08-12 DIAGNOSIS — A419 Sepsis, unspecified organism: Secondary | ICD-10-CM | POA: Diagnosis not present

## 2019-08-12 DIAGNOSIS — Z03818 Encounter for observation for suspected exposure to other biological agents ruled out: Secondary | ICD-10-CM | POA: Diagnosis not present

## 2019-08-12 DIAGNOSIS — Z515 Encounter for palliative care: Secondary | ICD-10-CM | POA: Diagnosis present

## 2019-08-12 DIAGNOSIS — Z7401 Bed confinement status: Secondary | ICD-10-CM | POA: Diagnosis not present

## 2019-08-12 DIAGNOSIS — Z86718 Personal history of other venous thrombosis and embolism: Secondary | ICD-10-CM | POA: Diagnosis not present

## 2019-08-12 DIAGNOSIS — Z7189 Other specified counseling: Secondary | ICD-10-CM | POA: Diagnosis not present

## 2019-08-12 DIAGNOSIS — R69 Illness, unspecified: Secondary | ICD-10-CM | POA: Diagnosis not present

## 2019-08-12 DIAGNOSIS — Z87440 Personal history of urinary (tract) infections: Secondary | ICD-10-CM | POA: Diagnosis not present

## 2019-08-12 DIAGNOSIS — R001 Bradycardia, unspecified: Secondary | ICD-10-CM | POA: Diagnosis not present

## 2019-08-12 DIAGNOSIS — M6281 Muscle weakness (generalized): Secondary | ICD-10-CM | POA: Diagnosis present

## 2019-08-12 DIAGNOSIS — B952 Enterococcus as the cause of diseases classified elsewhere: Secondary | ICD-10-CM | POA: Diagnosis present

## 2019-08-12 DIAGNOSIS — E039 Hypothyroidism, unspecified: Secondary | ICD-10-CM | POA: Diagnosis present

## 2019-08-12 DIAGNOSIS — E441 Mild protein-calorie malnutrition: Secondary | ICD-10-CM | POA: Diagnosis present

## 2019-08-12 DIAGNOSIS — G308 Other Alzheimer's disease: Secondary | ICD-10-CM | POA: Diagnosis present

## 2019-08-12 DIAGNOSIS — F0281 Dementia in other diseases classified elsewhere with behavioral disturbance: Secondary | ICD-10-CM | POA: Diagnosis present

## 2019-08-12 DIAGNOSIS — R4020 Unspecified coma: Secondary | ICD-10-CM | POA: Diagnosis not present

## 2019-08-12 DIAGNOSIS — G301 Alzheimer's disease with late onset: Secondary | ICD-10-CM | POA: Diagnosis not present

## 2019-08-12 DIAGNOSIS — R5381 Other malaise: Secondary | ICD-10-CM | POA: Diagnosis not present

## 2019-08-12 DIAGNOSIS — Z20828 Contact with and (suspected) exposure to other viral communicable diseases: Secondary | ICD-10-CM | POA: Diagnosis present

## 2019-08-12 DIAGNOSIS — I517 Cardiomegaly: Secondary | ICD-10-CM | POA: Diagnosis not present

## 2019-08-12 DIAGNOSIS — G9340 Encephalopathy, unspecified: Secondary | ICD-10-CM | POA: Diagnosis not present

## 2019-08-12 DIAGNOSIS — N1832 Chronic kidney disease, stage 3b: Secondary | ICD-10-CM | POA: Diagnosis not present

## 2019-08-12 DIAGNOSIS — N3000 Acute cystitis without hematuria: Secondary | ICD-10-CM | POA: Diagnosis not present

## 2019-08-12 DIAGNOSIS — G309 Alzheimer's disease, unspecified: Secondary | ICD-10-CM | POA: Diagnosis present

## 2019-08-12 DIAGNOSIS — G9341 Metabolic encephalopathy: Secondary | ICD-10-CM | POA: Diagnosis not present

## 2019-08-12 DIAGNOSIS — F0280 Dementia in other diseases classified elsewhere without behavioral disturbance: Secondary | ICD-10-CM | POA: Diagnosis not present

## 2019-08-12 DIAGNOSIS — R262 Difficulty in walking, not elsewhere classified: Secondary | ICD-10-CM | POA: Diagnosis present

## 2019-08-12 DIAGNOSIS — N390 Urinary tract infection, site not specified: Secondary | ICD-10-CM | POA: Diagnosis not present

## 2019-08-12 DIAGNOSIS — R4182 Altered mental status, unspecified: Secondary | ICD-10-CM | POA: Diagnosis not present

## 2019-08-12 DIAGNOSIS — Z681 Body mass index (BMI) 19 or less, adult: Secondary | ICD-10-CM | POA: Diagnosis not present

## 2019-08-12 DIAGNOSIS — A4181 Sepsis due to Enterococcus: Secondary | ICD-10-CM | POA: Diagnosis not present

## 2019-08-12 LAB — BASIC METABOLIC PANEL
Anion gap: 9 (ref 5–15)
BUN: 5 mg/dL — ABNORMAL LOW (ref 8–23)
CO2: 23 mmol/L (ref 22–32)
Calcium: 8.6 mg/dL — ABNORMAL LOW (ref 8.9–10.3)
Chloride: 111 mmol/L (ref 98–111)
Creatinine, Ser: 1.19 mg/dL (ref 0.61–1.24)
GFR calc Af Amer: >60 mL/min (ref >60)
GFR calc non Af Amer: 55 mL/min — ABNORMAL LOW (ref >60)
Glucose, Bld: 93 mg/dL (ref 70–99)
Potassium: 3.1 mmol/L — ABNORMAL LOW (ref 3.5–5.1)
Sodium: 143 mmol/L (ref 135–145)

## 2019-08-12 LAB — ECHOCARDIOGRAM COMPLETE
Height: 68 in
Weight: 1830.7 oz

## 2019-08-12 MED ORDER — LEVOTHYROXINE SODIUM 75 MCG PO TABS: 37.5000 ug | ORAL_TABLET | Freq: Every day | ORAL | 0 refills | Status: DC

## 2019-08-12 MED ORDER — NITROFURANTOIN MONOHYD MACRO 100 MG PO CAPS: 100.0000 mg | ORAL_CAPSULE | Freq: Two times a day (BID) | ORAL | 0 refills | Status: DC

## 2019-08-12 MED ORDER — NITROFURANTOIN MONOHYD MACRO 100 MG PO CAPS
100.0000 mg | ORAL_CAPSULE | Freq: Two times a day (BID) | ORAL | Status: DC
  Administered 2019-08-12: 100 mg via ORAL
  Filled 2019-08-12: qty 1

## 2019-08-12 MED ORDER — POTASSIUM CHLORIDE CRYS ER 20 MEQ PO TBCR
40.0000 meq | EXTENDED_RELEASE_TABLET | Freq: Once | ORAL | Status: AC
  Administered 2019-08-12: 40 meq via ORAL
  Filled 2019-08-12: qty 2

## 2019-08-12 NOTE — Discharge Summary (Signed)
Physician Discharge Summary  Phares Zaccone MBW:[id number] DOB: 12-30-1931 DOA: 08/08/2019  PCP: Neale Burly, MD  Admit date: 08/08/2019 Discharge date: 08/12/2019  Admitted From: SNF Disposition: SNF  Recommendations for Outpatient Follow-up:  1. Follow up with PCP in 1-2 weeks 2. Please obtain BMP/CBC in one week 3. Please follow up on the following pending results:   Discharge Condition: Stable CODE STATUS: DNR Diet recommendation: Heart Healthy    Brief/Interim Summary: 85 y.o.malewith past medical history significant for Alzheimer disease with behavioral disturbances, urine incontinence,chronic kidney disease a stage IIIb and prior history of DVT (chronically using Xarelto);who was brought by EMS to the emergency department secondary to worsening mentation and passing out. Patient at baseline is able to feed himself,follow commands and able to interact with staff. EMS reports that they were called to patient's nursing facility because he was trying to climb out of a chair and he had a syncopal episode that lasted about 10 minutes. They were told that he is normally is alert and talkative although he can be confused. They state that her blood pressure was 90/55 with heart rate of 38-42, pulse ox was 98%. CBG was 163. In the ED CT head demonstrated no acute intracranial abnormality, patient was found to be hypothermic, with elevated respiratory rate initially and having elevated lactic acid; urinalysis suggesting UTI. Blood work also demonstrated acute on chronic renal failure. Cultures taken, IV fluid resuscitation per sepsis protocol started, patient given Rocephin and TRH contacted   Discharge Diagnoses:  Sepsis -present on admission -continue IV antibiotics -blood cultures--contaminant -patient met sepsis criteria on admission with worsening mentation, worsening renal function, hypothermia, elevated RR and presumed UTI as per abnormal UA. -sepsis physiology resolved   UTI -E. Faecalis -d/c levoflox -start IV vanco--received 2 days -d/c with nitrofurantoin x 5 more days  Syncope -due to hypotension from sepsis -personally reviewed tele/ekg--no AV blocks -echo-EF 65-60%, G1DD, no valvular abnormalities -orthostatics--neg  Acute metabolic Encephalopathy -due to sepsis -11/18--near baseline according to niece -11/19--even more alert  Lactic acidosis  -in the setting of dehydration and sepsis -continue IVF's -remained hemodynamically stable  acute on chronic renal failure--ckd stage 3 -baseline creatinine 1.5-1.8 -serum creatinine peaked 2.98 -minimize/avoid nephrotoxic agents  -secondary to sepsis and volume depletion -serum creatinine 1.19 on day of d/c  History of DVT -heparin initially in the setting of worsening Cr -restart xarelto  dementia with behavioral disturbances. -continue namenda -aricept stopped due to bradycardia  Bradycardia -atropine given in ED -HR in upper 50s and low 60s when awake after aricept stopped -personally reviewed telemetry--no AV block -stop aricept -checking TSH-7.216 -cortisol level 22.4 -not a candidate for PPM  Hypothyroidism -TSH 7.216 -increase synthroid -recheck TSH in 3-4 weeks  Urinary retention -11/19 foley removed and pt able to void spontaneously  Discharge Instructions   Allergies as of 08/12/2019      Reactions   Penicillins    Has patient had a PCN reaction causing immediate rash, facial/tongue/throat swelling, SOB or lightheadedness with hypotension: Unknown Has patient had a PCN reaction causing severe rash involving mucus membranes or skin necrosis: Unknown Has patient had a PCN reaction that required hospitalization: Unknown Has patient had a PCN reaction occurring within the last 10 years: Unknown If all of the above answers are "NO", then may proceed with Cephalosporin use.      Medication List    STOP taking these medications   desmopressin 0.2 MG  tablet Commonly known as: DDAVP   donepezil 10 MG tablet  Commonly known as: ARICEPT     TAKE these medications   levothyroxine 75 MCG tablet Commonly known as: SYNTHROID Take 0.5 tablets (37.5 mcg total) by mouth daily at 6 (six) AM. Start taking on: August 13, 2019   memantine 10 MG tablet Commonly known as: NAMENDA Take 10 mg by mouth 2 (two) times daily.   nitrofurantoin (macrocrystal-monohydrate) 100 MG capsule Commonly known as: MACROBID Take 1 capsule (100 mg total) by mouth every 12 (twelve) hours. X 5 days   OLANZapine 2.5 MG tablet Commonly known as: ZYPREXA Take 2.5 mg by mouth at bedtime.   polyethylene glycol 17 g packet Commonly known as: MIRALAX / GLYCOLAX Take 17 g by mouth daily.   rivaroxaban 20 MG Tabs tablet Commonly known as: XARELTO Take 20 mg by mouth daily with supper.   senna 8.6 MG tablet Commonly known as: SENOKOT Take 1 tablet by mouth daily.   vitamin B-12 500 MCG tablet Commonly known as: CYANOCOBALAMIN Take 500 mcg by mouth daily.      Contact information for after-discharge care    [facility] .   Service: Skilled Nursing Contact information: [place] [phone]             Allergies  Allergen Reactions  . Penicillins     Has patient had a PCN reaction causing immediate rash, facial/tongue/throat swelling, SOB or lightheadedness with hypotension: Unknown Has patient had a PCN reaction causing severe rash involving mucus membranes or skin necrosis: Unknown Has patient had a PCN reaction that required hospitalization: Unknown Has patient had a PCN reaction occurring within the last 10 years: Unknown If all of the above answers are "NO", then may proceed with Cephalosporin use.     Consultations:  none   Procedures/Studies: Ct Head Wo Contrast  Result Date: 08/08/2019 CLINICAL DATA:  Altered level of consciousness, unexplained.  EXAM: CT HEAD WITHOUT CONTRAST TECHNIQUE: Contiguous axial images were obtained from the base of the skull through the vertex without intravenous contrast. COMPARISON:  07/30/2013 FINDINGS: Brain: No evidence of acute infarction, hemorrhage, hydrocephalus, extra-axial collection or mass lesion/mass effect. There is mild diffuse low-attenuation within the subcortical and periventricular white matter compatible with chronic microvascular disease. Prominence of the CSF spaces compatible with brain atrophy. Vascular: No hyperdense vessel or unexpected calcification. Skull: Normal. Negative for fracture or focal lesion. Sinuses/Orbits: Paranasal sinuses and mastoid air cells are clear. Other: None IMPRESSION: 1. No acute intracranial abnormalities. 2. Chronic small vessel ischemic change and brain atrophy. Electronically Signed   By: Kerby Moors M.D.   On: 08/08/2019 07:05   Dg Chest Port 1 View  Result Date: 08/08/2019 CLINICAL DATA:  Syncope and bradycardia. EXAM: PORTABLE CHEST 1 VIEW COMPARISON:  01/19/2018 FINDINGS: Moderate cardiac enlargement. There is asymmetric elevation of right hemidiaphragm. No pleural effusion or edema identified. No airspace opacities. IMPRESSION: 1. No acute cardiopulmonary abnormalities. 2. Asymmetric elevation of right hemidiaphragm. 3. Cardiac enlargement. Electronically Signed   By: Kerby Moors M.D.   On: 08/08/2019 06:59        Discharge Exam: Vitals:   08/12/19 0439 08/12/19 0447  BP: (!) 165/89 (!) 167/81  Pulse: (!) 59 (!) 57  Resp:    Temp: 98.6 F (37 C)   SpO2: 98%    Vitals:   08/11/19 2100 08/11/19 2334 08/12/19 0439 08/12/19 0447  BP: (!) 177/85 (!) 163/93 (!) 165/89 (!) 167/81  Pulse: (!) 54 67 (!) 59 Marland Kitchen)  57  Resp: 20     Temp: 98.8 F (37.1 C)  98.6 F (37 C)   TempSrc: Oral  Oral   SpO2: 100%  98%   Weight:      Height:        General: Pt is alert, awake, not in acute distress Cardiovascular: RRR, S1/S2 +, no rubs, no gallops  Respiratory: bibasilar crackles, no wheeze Abdominal: Soft, NT, ND, bowel sounds + Extremities: no edema, no cyanosis   The results of significant diagnostics from this hospitalization (including imaging, microbiology, ancillary and laboratory) are listed below for reference.    Significant Diagnostic Studies: Ct Head Wo Contrast  Result Date: 08/08/2019 CLINICAL DATA:  Altered level of consciousness, unexplained. EXAM: CT HEAD WITHOUT CONTRAST TECHNIQUE: Contiguous axial images were obtained from the base of the skull through the vertex without intravenous contrast. COMPARISON:  07/30/2013 FINDINGS: Brain: No evidence of acute infarction, hemorrhage, hydrocephalus, extra-axial collection or mass lesion/mass effect. There is mild diffuse low-attenuation within the subcortical and periventricular white matter compatible with chronic microvascular disease. Prominence of the CSF spaces compatible with brain atrophy. Vascular: No hyperdense vessel or unexpected calcification. Skull: Normal. Negative for fracture or focal lesion. Sinuses/Orbits: Paranasal sinuses and mastoid air cells are clear. Other: None IMPRESSION: 1. No acute intracranial abnormalities. 2. Chronic small vessel ischemic change and brain atrophy. Electronically Signed   By: Kerby Moors M.D.   On: 08/08/2019 07:05   Dg Chest Port 1 View  Result Date: 08/08/2019 CLINICAL DATA:  Syncope and bradycardia. EXAM: PORTABLE CHEST 1 VIEW COMPARISON:  01/19/2018 FINDINGS: Moderate cardiac enlargement. There is asymmetric elevation of right hemidiaphragm. No pleural effusion or edema identified. No airspace opacities. IMPRESSION: 1. No acute cardiopulmonary abnormalities. 2. Asymmetric elevation of right hemidiaphragm. 3. Cardiac enlargement. Electronically Signed   By: Kerby Moors M.D.   On: 08/08/2019 06:59     Microbiology: Recent Results (from the past 240 hour(s))  SARS CORONAVIRUS 2 (Danaria Larsen 6-24 HRS) Nasopharyngeal Nasopharyngeal  Swab     Status: None   Collection Time: 08/08/19  6:22 AM   Specimen: Nasopharyngeal Swab  Result Value Ref Range Status   SARS Coronavirus 2 NEGATIVE NEGATIVE Final    Comment: (NOTE) SARS-CoV-2 target nucleic acids are NOT DETECTED. The SARS-CoV-2 RNA is generally detectable in upper and lower respiratory specimens during the acute phase of infection. Negative results do not preclude SARS-CoV-2 infection, do not rule out co-infections with other pathogens, and should not be used as the sole basis for treatment or other patient management decisions. Negative results must be combined with clinical observations, patient history, and epidemiological information. The expected result is Negative. Fact Sheet for Patients: SugarRoll.be Fact Sheet for Healthcare Providers: https://www.woods-mathews.com/ This test is not yet approved or cleared by the [place] FDA and  has been authorized for detection and/or diagnosis of SARS-CoV-2 by FDA under an Emergency Use Authorization (EUA). This EUA will remain  in effect (meaning this test can be used) for the duration of the COVID-19 declaration under Section 56 4(b)(1) of the Act, 21 U.S.C. section 360bbb-3(b)(1), unless the authorization is terminated or revoked sooner. Performed at [facility], [place] [address], [place], [place] [address]   Culture, blood (routine x 2)     Status: Abnormal   Collection Time: 08/08/19  8:58 AM   Specimen: BLOOD RIGHT HAND  Result Value Ref Range Status   Specimen Description   Final    BLOOD RIGHT HAND BOTTLES DRAWN AEROBIC AND  ANAEROBIC Performed at [name], [address] [place], [place] [address]    Special Requests   Final    Blood Culture adequate volume Performed at [facility], [address] [place], [place] [address]    Culture  Setup Time   Final    GRAM POSITIVE COCCI AEROBIC BOTTLE ONLY Gram Stain Report Called to,Read Back By and  Verified With: HEATHER E.,RN '@[id number]'$  08/09/2019 KAY Performed at [facility], [address], [place], [place] [address]    Culture (A)  Final    STAPHYLOCOCCUS SPECIES (COAGULASE NEGATIVE) THE SIGNIFICANCE OF ISOLATING THIS ORGANISM FROM A SINGLE SET OF BLOOD CULTURES WHEN MULTIPLE SETS ARE DRAWN IS UNCERTAIN. PLEASE NOTIFY THE MICROBIOLOGY DEPARTMENT WITHIN ONE WEEK IF SPECIATION AND SENSITIVITIES ARE REQUIRED. Performed at [facility], [place] [address], [place], [place] [address]    Report Status 08/10/2019 FINAL  Final  Culture, blood (routine x 2)     Status: None (Preliminary result)   Collection Time: 08/08/19  9:01 AM   Specimen: Left Antecubital; Blood  Result Value Ref Range Status   Specimen Description   Final    LEFT ANTECUBITAL BOTTLES DRAWN AEROBIC AND ANAEROBIC   Special Requests Blood Culture adequate volume  Final   Culture   Final    NO GROWTH 4 DAYS Performed at [facility], [address], [place], [place] [address]    Report Status PENDING  Incomplete  Urine culture     Status: Abnormal   Collection Time: 08/08/19  5:00 PM   Specimen: Urine, Catheterized  Result Value Ref Range Status   Specimen Description   Final    URINE, CATHETERIZED Performed at [facility], [address] [place], [place] [address]    Special Requests   Final    NONE Performed at [facility], [address] [place], [place] [address]    Culture >=100,000 COLONIES/mL ENTEROCOCCUS FAECALIS (A)  Final   Report Status 08/11/2019 FINAL  Final   Organism ID, Bacteria ENTEROCOCCUS FAECALIS (A)  Final      Susceptibility   Enterococcus faecalis - MIC*    AMPICILLIN <=2 SENSITIVE Sensitive     LEVOFLOXACIN 1 SENSITIVE Sensitive     NITROFURANTOIN <=16 SENSITIVE Sensitive     VANCOMYCIN 1 SENSITIVE Sensitive     * >=100,000 COLONIES/mL ENTEROCOCCUS FAECALIS     Labs: Basic Metabolic Panel: Recent Labs  Lab 08/08/19 0858 08/09/19 0519 08/12/19 0500  NA 144 146* 143  K 3.7 3.6  3.1*  CL 107 113* 111  CO2 '26 23 23  '$ GLUCOSE 111* 80 93  BUN 11 10 5*  CREATININE 1.49* 1.22 1.19  CALCIUM 9.1 8.5* 8.6*   Liver Function Tests: Recent Labs  Lab 08/08/19 0858  AST 21  ALT 10  ALKPHOS 75  BILITOT 0.8  PROT 7.5  ALBUMIN 3.8   No results for input(s): LIPASE, AMYLASE in the last 168 hours. No results for input(s): AMMONIA in the last 168 hours. CBC: Recent Labs  Lab 08/08/19 0858 08/09/19 0519  WBC 8.1 4.6  NEUTROABS 6.6  --   HGB 15.7 12.8*  HCT 47.7 38.7*  MCV 90.9 91.3  PLT 148* 123*   Cardiac Enzymes: No results for input(s): CKTOTAL, CKMB, CKMBINDEX, TROPONINI in the last 168 hours. BNP: Invalid input(s): POCBNP CBG: Recent Labs  Lab 08/10/19 1548  GLUCAP 118*    Time coordinating discharge:  36 minutes  Signed:  Orson Eva, DO [facility] Pager: [phone] 08/12/2019, 3:35  PM   

## 2019-08-12 NOTE — Progress Notes (Signed)
*  PRELIMINARY RESULTS* Echocardiogram 2D Echocardiogram has been performed.  Robert Hurst 08/12/2019, 10:20 AM

## 2019-08-13 LAB — CULTURE, BLOOD (ROUTINE X 2)
Culture: NO GROWTH
Special Requests: ADEQUATE

## 2019-08-14 ENCOUNTER — Inpatient Hospital Stay (HOSPITAL_COMMUNITY)
Admission: EM | Admit: 2019-08-14 | Discharge: 2019-08-17 | DRG: 071 | Disposition: A | Discharge status: Home / Self Care | Payer: Medicare Other | Attending: Internal Medicine | Admitting: Internal Medicine

## 2019-08-14 ENCOUNTER — Emergency Department (HOSPITAL_COMMUNITY): Payer: Medicare Other

## 2019-08-14 ENCOUNTER — Other Ambulatory Visit: Payer: Self-pay

## 2019-08-14 ENCOUNTER — Encounter (HOSPITAL_COMMUNITY): Payer: Self-pay | Admitting: Emergency Medicine

## 2019-08-14 DIAGNOSIS — R001 Bradycardia, unspecified: Secondary | ICD-10-CM | POA: Diagnosis not present

## 2019-08-14 DIAGNOSIS — N390 Urinary tract infection, site not specified: Secondary | ICD-10-CM | POA: Diagnosis present

## 2019-08-14 DIAGNOSIS — R4182 Altered mental status, unspecified: Secondary | ICD-10-CM | POA: Diagnosis not present

## 2019-08-14 DIAGNOSIS — R1310 Dysphagia, unspecified: Secondary | ICD-10-CM | POA: Diagnosis present

## 2019-08-14 DIAGNOSIS — Z681 Body mass index (BMI) 19 or less, adult: Secondary | ICD-10-CM

## 2019-08-14 DIAGNOSIS — Z86718 Personal history of other venous thrombosis and embolism: Secondary | ICD-10-CM | POA: Diagnosis not present

## 2019-08-14 DIAGNOSIS — Z66 Do not resuscitate: Secondary | ICD-10-CM | POA: Diagnosis present

## 2019-08-14 DIAGNOSIS — G9341 Metabolic encephalopathy: Secondary | ICD-10-CM | POA: Diagnosis present

## 2019-08-14 DIAGNOSIS — G9340 Encephalopathy, unspecified: Secondary | ICD-10-CM | POA: Diagnosis not present

## 2019-08-14 DIAGNOSIS — Z20828 Contact with and (suspected) exposure to other viral communicable diseases: Secondary | ICD-10-CM | POA: Diagnosis present

## 2019-08-14 DIAGNOSIS — B952 Enterococcus as the cause of diseases classified elsewhere: Secondary | ICD-10-CM | POA: Diagnosis present

## 2019-08-14 DIAGNOSIS — F0280 Dementia in other diseases classified elsewhere without behavioral disturbance: Secondary | ICD-10-CM | POA: Diagnosis not present

## 2019-08-14 DIAGNOSIS — N1832 Chronic kidney disease, stage 3b: Secondary | ICD-10-CM | POA: Diagnosis present

## 2019-08-14 DIAGNOSIS — Z48815 Encounter for surgical aftercare following surgery on the digestive system: Secondary | ICD-10-CM | POA: Diagnosis present

## 2019-08-14 DIAGNOSIS — G308 Other Alzheimer's disease: Secondary | ICD-10-CM | POA: Diagnosis present

## 2019-08-14 DIAGNOSIS — R5381 Other malaise: Secondary | ICD-10-CM | POA: Diagnosis not present

## 2019-08-14 DIAGNOSIS — R4020 Unspecified coma: Secondary | ICD-10-CM | POA: Diagnosis not present

## 2019-08-14 DIAGNOSIS — R2681 Unsteadiness on feet: Secondary | ICD-10-CM | POA: Diagnosis present

## 2019-08-14 DIAGNOSIS — N200 Calculus of kidney: Secondary | ICD-10-CM | POA: Diagnosis present

## 2019-08-14 DIAGNOSIS — G309 Alzheimer's disease, unspecified: Secondary | ICD-10-CM | POA: Diagnosis present

## 2019-08-14 DIAGNOSIS — K8043 Calculus of bile duct with acute cholecystitis with obstruction: Secondary | ICD-10-CM | POA: Diagnosis present

## 2019-08-14 DIAGNOSIS — E441 Mild protein-calorie malnutrition: Secondary | ICD-10-CM | POA: Diagnosis present

## 2019-08-14 DIAGNOSIS — R55 Syncope and collapse: Secondary | ICD-10-CM | POA: Diagnosis present

## 2019-08-14 DIAGNOSIS — E039 Hypothyroidism, unspecified: Secondary | ICD-10-CM | POA: Diagnosis present

## 2019-08-14 DIAGNOSIS — Z87440 Personal history of urinary (tract) infections: Secondary | ICD-10-CM | POA: Diagnosis not present

## 2019-08-14 DIAGNOSIS — F0281 Dementia in other diseases classified elsewhere with behavioral disturbance: Secondary | ICD-10-CM | POA: Diagnosis present

## 2019-08-14 DIAGNOSIS — R262 Difficulty in walking, not elsewhere classified: Secondary | ICD-10-CM | POA: Diagnosis present

## 2019-08-14 DIAGNOSIS — M6281 Muscle weakness (generalized): Secondary | ICD-10-CM | POA: Diagnosis present

## 2019-08-14 DIAGNOSIS — R41841 Cognitive communication deficit: Secondary | ICD-10-CM | POA: Diagnosis present

## 2019-08-14 DIAGNOSIS — R1312 Dysphagia, oropharyngeal phase: Secondary | ICD-10-CM | POA: Diagnosis present

## 2019-08-14 DIAGNOSIS — Z7189 Other specified counseling: Secondary | ICD-10-CM

## 2019-08-14 DIAGNOSIS — N179 Acute kidney failure, unspecified: Secondary | ICD-10-CM

## 2019-08-14 DIAGNOSIS — G301 Alzheimer's disease with late onset: Secondary | ICD-10-CM | POA: Diagnosis not present

## 2019-08-14 DIAGNOSIS — N3000 Acute cystitis without hematuria: Secondary | ICD-10-CM

## 2019-08-14 DIAGNOSIS — E440 Moderate protein-calorie malnutrition: Secondary | ICD-10-CM | POA: Diagnosis present

## 2019-08-14 DIAGNOSIS — Z03818 Encounter for observation for suspected exposure to other biological agents ruled out: Secondary | ICD-10-CM | POA: Diagnosis not present

## 2019-08-14 DIAGNOSIS — I517 Cardiomegaly: Secondary | ICD-10-CM | POA: Diagnosis not present

## 2019-08-14 DIAGNOSIS — Z515 Encounter for palliative care: Secondary | ICD-10-CM

## 2019-08-14 DIAGNOSIS — R498 Other voice and resonance disorders: Secondary | ICD-10-CM | POA: Diagnosis present

## 2019-08-14 LAB — BLOOD GAS, VENOUS
Acid-Base Excess: 2.3 mmol/L — ABNORMAL HIGH (ref 0.0–2.0)
Bicarbonate: 23.5 mmol/L (ref 20.0–28.0)
FIO2: 21
O2 Saturation: 17.1 %
Patient temperature: 36.8
pCO2, Ven: 53.3 mmHg (ref 44.0–60.0)
pH, Ven: 7.335 (ref 7.250–7.430)
pO2, Ven: <31 mmHg — CL (ref 32.0–45.0)

## 2019-08-14 LAB — DIFFERENTIAL
Abs Immature Granulocytes: 0.01 10*3/uL (ref 0.00–0.07)
Basophils Absolute: 0 10*3/uL (ref 0.0–0.1)
Basophils Relative: 0 %
Eosinophils Absolute: 0.1 10*3/uL (ref 0.0–0.5)
Eosinophils Relative: 3 %
Immature Granulocytes: 0 %
Lymphocytes Relative: 34 %
Lymphs Abs: 1.7 10*3/uL (ref 0.7–4.0)
Monocytes Absolute: 0.6 10*3/uL (ref 0.1–1.0)
Monocytes Relative: 13 %
Neutro Abs: 2.4 10*3/uL (ref 1.7–7.7)
Neutrophils Relative %: 50 %

## 2019-08-14 LAB — COMPREHENSIVE METABOLIC PANEL
ALT: 17 U/L (ref 0–44)
AST: 41 U/L (ref 15–41)
Albumin: 3.3 g/dL — ABNORMAL LOW (ref 3.5–5.0)
Alkaline Phosphatase: 68 U/L (ref 38–126)
Anion gap: 10 (ref 5–15)
BUN: 20 mg/dL (ref 8–23)
CO2: 27 mmol/L (ref 22–32)
Calcium: 9.2 mg/dL (ref 8.9–10.3)
Chloride: 108 mmol/L (ref 98–111)
Creatinine, Ser: 2 mg/dL — ABNORMAL HIGH (ref 0.61–1.24)
GFR calc Af Amer: 34 mL/min — ABNORMAL LOW (ref >60)
GFR calc non Af Amer: 30 mL/min — ABNORMAL LOW (ref >60)
Glucose, Bld: 92 mg/dL (ref 70–99)
Potassium: 4 mmol/L (ref 3.5–5.1)
Sodium: 145 mmol/L (ref 135–145)
Total Bilirubin: 1.1 mg/dL (ref 0.3–1.2)
Total Protein: 6.9 g/dL (ref 6.5–8.1)

## 2019-08-14 LAB — CBC
HCT: 42.1 % (ref 39.0–52.0)
Hemoglobin: 13.9 g/dL (ref 13.0–17.0)
MCH: 30.2 pg (ref 26.0–34.0)
MCHC: 33 g/dL (ref 30.0–36.0)
MCV: 91.3 fL (ref 80.0–100.0)
Platelets: 143 10*3/uL — ABNORMAL LOW (ref 150–400)
RBC: 4.61 MIL/uL (ref 4.22–5.81)
RDW: 14.9 % (ref 11.5–15.5)
WBC: 4.8 10*3/uL (ref 4.0–10.5)
nRBC: 0 % (ref 0.0–0.2)

## 2019-08-14 LAB — PROTIME-INR
INR: 1.1 (ref 0.8–1.2)
Prothrombin Time: 14 seconds (ref 11.4–15.2)

## 2019-08-14 LAB — AMMONIA: Ammonia: 14 umol/L (ref 9–35)

## 2019-08-14 LAB — URINALYSIS, ROUTINE W REFLEX MICROSCOPIC
Bilirubin Urine: NEGATIVE
Glucose, UA: NEGATIVE mg/dL
Ketones, ur: 5 mg/dL — AB
Nitrite: NEGATIVE
Protein, ur: NEGATIVE mg/dL
RBC / HPF: >50 RBC/hpf — ABNORMAL HIGH (ref 0–5)
Specific Gravity, Urine: 1.011 (ref 1.005–1.030)
pH: 5 (ref 5.0–8.0)

## 2019-08-14 LAB — ETHANOL: Alcohol, Ethyl (B): <10 mg/dL (ref <10)

## 2019-08-14 LAB — RAPID URINE DRUG SCREEN, HOSP PERFORMED
Amphetamines: NOT DETECTED
Barbiturates: NOT DETECTED
Benzodiazepines: NOT DETECTED
Cocaine: NOT DETECTED
Opiates: NOT DETECTED
Tetrahydrocannabinol: NOT DETECTED

## 2019-08-14 LAB — APTT: aPTT: 28 seconds (ref 24–36)

## 2019-08-14 MED ORDER — MEMANTINE HCL 10 MG PO TABS: 10.0000 mg | ORAL_TABLET | Freq: Two times a day (BID) | ORAL | Status: DC

## 2019-08-14 MED ORDER — SODIUM CHLORIDE 0.9 % IV SOLN
100.0000 mL/h | INTRAVENOUS | Status: DC
  Administered 2019-08-14 – 2019-08-15 (×2): 100 mL/h via INTRAVENOUS

## 2019-08-14 MED ORDER — ACETAMINOPHEN 650 MG RE SUPP: 650.0000 mg | Freq: Four times a day (QID) | RECTAL | Status: DC | PRN

## 2019-08-14 MED ORDER — SODIUM CHLORIDE 0.9 % IV BOLUS
500.0000 mL | Freq: Once | INTRAVENOUS | Status: AC
  Administered 2019-08-14: 500 mL via INTRAVENOUS

## 2019-08-14 MED ORDER — ONDANSETRON HCL 4 MG/2ML IJ SOLN: 4.0000 mg | Freq: Four times a day (QID) | INTRAMUSCULAR | Status: DC | PRN

## 2019-08-14 MED ORDER — LEVOTHYROXINE SODIUM 75 MCG PO TABS
37.5000 ug | ORAL_TABLET | Freq: Every day | ORAL | Status: DC
  Administered 2019-08-16 – 2019-08-17 (×2): 37.5 ug via ORAL
  Filled 2019-08-14: qty 0.5
  Filled 2019-08-14: qty 1
  Filled 2019-08-14: qty 0.5
  Filled 2019-08-14 (×2): qty 1

## 2019-08-14 MED ORDER — POLYETHYLENE GLYCOL 3350 17 G PO PACK
17.0000 g | PACK | Freq: Every day | ORAL | Status: DC
  Administered 2019-08-14 – 2019-08-17 (×3): 17 g via ORAL
  Filled 2019-08-14 (×5): qty 1

## 2019-08-14 MED ORDER — OLANZAPINE 5 MG PO TABS
2.5000 mg | ORAL_TABLET | Freq: Every day | ORAL | Status: DC
  Administered 2019-08-14: 2.5 mg via ORAL
  Filled 2019-08-14 (×2): qty 1

## 2019-08-14 MED ORDER — ACETAMINOPHEN 325 MG PO TABS: 650.0000 mg | ORAL_TABLET | Freq: Four times a day (QID) | ORAL | Status: DC | PRN

## 2019-08-14 MED ORDER — APIXABAN 2.5 MG PO TABS
2.5000 mg | ORAL_TABLET | Freq: Two times a day (BID) | ORAL | Status: DC
  Administered 2019-08-14 – 2019-08-17 (×7): 2.5 mg via ORAL
  Filled 2019-08-14 (×12): qty 1

## 2019-08-14 MED ORDER — ONDANSETRON HCL 4 MG PO TABS: 4.0000 mg | ORAL_TABLET | Freq: Four times a day (QID) | ORAL | Status: DC | PRN

## 2019-08-14 MED ORDER — SODIUM CHLORIDE 0.9 % IV SOLN
1.0000 g | INTRAVENOUS | Status: DC
  Administered 2019-08-15 – 2019-08-17 (×3): 1 g via INTRAVENOUS
  Filled 2019-08-14 (×3): qty 10

## 2019-08-14 MED ORDER — THIAMINE HCL 100 MG/ML IJ SOLN
100.0000 mg | Freq: Every day | INTRAMUSCULAR | Status: DC
  Administered 2019-08-14 – 2019-08-17 (×4): 100 mg via INTRAVENOUS
  Filled 2019-08-14 (×4): qty 2

## 2019-08-14 MED ORDER — MEMANTINE HCL 10 MG PO TABS
5.0000 mg | ORAL_TABLET | Freq: Two times a day (BID) | ORAL | Status: DC
  Administered 2019-08-14 – 2019-08-17 (×6): 5 mg via ORAL
  Filled 2019-08-14 (×10): qty 1

## 2019-08-14 MED ORDER — SENNA 8.6 MG PO TABS
1.0000 | ORAL_TABLET | Freq: Every day | ORAL | Status: DC
  Administered 2019-08-15 – 2019-08-17 (×3): 8.6 mg via ORAL
  Filled 2019-08-14 (×5): qty 1

## 2019-08-14 MED ORDER — VITAMIN B-12 1000 MCG PO TABS
500.0000 ug | ORAL_TABLET | Freq: Every day | ORAL | Status: DC
  Administered 2019-08-14 – 2019-08-17 (×4): 500 ug via ORAL
  Filled 2019-08-14 (×2): qty 1
  Filled 2019-08-14: qty 0.5
  Filled 2019-08-14: qty 1
  Filled 2019-08-14 (×2): qty 0.5

## 2019-08-14 MED ORDER — SODIUM CHLORIDE 0.9 % IV SOLN
1.0000 g | Freq: Once | INTRAVENOUS | Status: AC
  Administered 2019-08-14: 1 g via INTRAVENOUS
  Filled 2019-08-14: qty 10

## 2019-08-14 NOTE — ED Notes (Signed)
PT's Daughter called to get update on him.  Lynett Fish [phone].

## 2019-08-14 NOTE — H&P (Signed)
History and Physical    Robert Hurst ZOX:[id number] DOB: 08/26/1932 DOA: 08/14/2019  PCP: Toma DeitersHasanaj, Xaje A, MD   Patient coming from: [facility]  Chief Complaint: AMS  HPI: Robert Hurst is a 85 y.o. male with medical history significant for Alzheimer dementia with behavioral disturbances, CKD stage IIIb, prior history of DVT on Xarelto, urinary incontinence, and recent discharge after treatment for AKI and Enterococcus faecalis UTI on 11/19 who presented back from his facility with worsening mentation.  The symptoms have begun in the last day or so and he has remained minimally responsive.  It is unclear whether or not he has been eating or drinking much or even taking his antibiotics.  No other symptoms otherwise noted.   ED Course: Patient noted to be bradycardic in the emergency department with 43 BPM noted on telemetry and EKG confirms about the same.  He had received atropine on prior admission with some improvement noted.  He has been started on Rocephin as well as IV fluid for AKI with creatinine noted to be 2 and ongoing signs of UTI noted on urine analysis.  Discussed case with his daughter regarding the possible need for palliative care and hospice soon.  Patient is noted be DNR.  He cannot give any significant history as he is quite altered and somnolent.  He is arousable to painful stimuli however.  Review of Systems: Cannot be obtained given patient condition.  Past Medical History:  Diagnosis Date  . Alzheimer disease (HCC)   . DVT (deep venous thrombosis) (HCC)   . Malnourished [facility](HCC)     Past Surgical History:  Procedure Laterality Date  . APPENDECTOMY    . BALLOON DILATION N/A 01/15/2018   Procedure: BALLOON DILATION;  Surgeon: Malissa Hippoehman, Najeeb U, MD;  [facility];  Service: Endoscopy;  Laterality: N/A;  . ERCP N/A 01/15/2018   Procedure: ENDOSCOPIC RETROGRADE CHOLANGIOPANCREATOGRAPHY (ERCP);  Surgeon: Malissa Hippoehman, Najeeb U, MD;  [facility];  Service:  Endoscopy;  Laterality: N/A;  . REMOVAL OF STONES N/A 01/15/2018   Procedure: REMOVAL OF STONES;  Surgeon: Malissa Hippoehman, Najeeb U, MD;  [facility];  Service: Endoscopy;  Laterality: N/A;  . SPHINCTEROTOMY N/A 01/15/2018   Procedure: SPHINCTEROTOMY;  Surgeon: Malissa Hippoehman, Najeeb U, MD;  [facility];  Service: Endoscopy;  Laterality: N/A;  . TONSILLECTOMY       reports that he has never smoked. He has never used smokeless tobacco. He reports previous alcohol use. He reports previous drug use.  Allergies  Allergen Reactions  . Penicillins     Has patient had a PCN reaction causing immediate rash, facial/tongue/throat swelling, SOB or lightheadedness with hypotension: Unknown Has patient had a PCN reaction causing severe rash involving mucus membranes or skin necrosis: Unknown Has patient had a PCN reaction that required hospitalization: Unknown Has patient had a PCN reaction occurring within the last 10 years: Unknown If all of the above answers are "NO", then may proceed with Cephalosporin use.     History reviewed. No pertinent family history.  Prior to Admission medications   Medication Sig Start Date End Date Taking? Authorizing Provider  levothyroxine (SYNTHROID) 75 MCG tablet Take 0.5 tablets (37.5 mcg total) by mouth daily at 6 (six) AM. 08/13/19  Yes Tat, Onalee Huaavid, MD  nitrofurantoin, macrocrystal-monohydrate, (MACROBID) 100 MG capsule Take 1 capsule (100 mg total) by mouth every 12 (twelve) hours. X 5 days 08/12/19  Yes Tat, Onalee Huaavid, MD  polyethylene glycol (MIRALAX / GLYCOLAX) 17 g packet  Take 17 g by mouth daily.   Yes [provider]  senna (SENOKOT) 8.6 MG tablet Take 1 tablet by mouth daily.   Yes [provider]  vitamin B-12 (CYANOCOBALAMIN) 500 MCG tablet Take 500 mcg by mouth daily.   Yes [provider]  memantine (NAMENDA) 10 MG tablet Take 10 mg by mouth 2 (two) times daily.    [provider]  OLANZapine (ZYPREXA) 2.5 MG  tablet Take 2.5 mg by mouth at bedtime.    [provider]  rivaroxaban (XARELTO) 20 MG TABS tablet Take 20 mg by mouth daily with supper.    [provider]    Physical Exam: Vitals:   08/14/19 0930 08/14/19 1000 08/14/19 1100 08/14/19 1130  BP: 138/72 118/67 (!) 148/61 133/75  Pulse: (!) 45 (!) 41 (!) 44 (!) 38  Resp:  (!) 9 11 (!) 8  Temp:      TempSrc:      SpO2: 93% 98% 95% 97%    Constitutional: NAD, somnolent and mostly unresponsive Vitals:   08/14/19 0930 08/14/19 1000 08/14/19 1100 08/14/19 1130  BP: 138/72 118/67 (!) 148/61 133/75  Pulse: (!) 45 (!) 41 (!) 44 (!) 38  Resp:  (!) 9 11 (!) 8  Temp:      TempSrc:      SpO2: 93% 98% 95% 97%   Eyes: Patient somnolent ENMT: Not examined. Neck: normal, supple Respiratory: clear to auscultation bilaterally. Normal respiratory effort. No accessory muscle use.  Cardiovascular: Regular rate and rhythm, currently bradycardic, no murmurs. No extremity edema. Abdomen: no tenderness, no distention. Bowel sounds positive.  Musculoskeletal:  No joint deformity upper and lower extremities.   Skin: no rashes, lesions, ulcers.  Psychiatric: Cannot be assessed given patient condition  Labs on Admission: I have personally reviewed following labs and imaging studies  CBC: Recent Labs  Lab 08/08/19 0858 08/09/19 0519 08/14/19 0937  WBC 8.1 4.6 4.8  NEUTROABS 6.6  --  2.4  HGB 15.7 12.8* 13.9  HCT 47.7 38.7* 42.1  MCV 90.9 91.3 91.3  PLT 148* 123* 470*   Basic Metabolic Panel: Recent Labs  Lab 08/08/19 0858 08/09/19 0519 08/12/19 0500 08/14/19 0937  NA 144 146* 143 145  K 3.7 3.6 3.1* 4.0  CL 107 113* 111 108  CO2 26 23 23 27   GLUCOSE 111* 80 93 92  BUN 11 10 5* 20  CREATININE 1.49* 1.22 1.19 2.00*  CALCIUM 9.1 8.5* 8.6* 9.2   GFR: Estimated Creatinine Clearance: 19.8 mL/min (A) (by C-G formula based on SCr of 2 mg/dL (H)). Liver Function Tests: Recent Labs  Lab 08/08/19 0858 08/14/19 0937   AST 21 41  ALT 10 17  ALKPHOS 75 68  BILITOT 0.8 1.1  PROT 7.5 6.9  ALBUMIN 3.8 3.3*   No results for input(s): LIPASE, AMYLASE in the last 168 hours. Recent Labs  Lab 08/14/19 0937  AMMONIA 14   Coagulation Profile: Recent Labs  Lab 08/14/19 0937  INR 1.1   Cardiac Enzymes: No results for input(s): CKTOTAL, CKMB, CKMBINDEX, TROPONINI in the last 168 hours. BNP (last 3 results) No results for input(s): PROBNP in the last 8760 hours. HbA1C: No results for input(s): HGBA1C in the last 72 hours. CBG: Recent Labs  Lab 08/10/19 1548  GLUCAP 118*   Lipid Profile: No results for input(s): CHOL, HDL, LDLCALC, TRIG, CHOLHDL, LDLDIRECT in the last 72 hours. Thyroid Function Tests: No results for input(s): TSH, T4TOTAL, FREET4, T3FREE, THYROIDAB in the last  72 hours. Anemia Panel: No results for input(s): VITAMINB12, FOLATE, FERRITIN, TIBC, IRON, RETICCTPCT in the last 72 hours. Urine analysis:    Component Value Date/Time   COLORURINE YELLOW 08/14/2019 0921   APPEARANCEUR HAZY (A) 08/14/2019 0921   LABSPEC 1.011 08/14/2019 0921   PHURINE 5.0 08/14/2019 0921   GLUCOSEU NEGATIVE 08/14/2019 0921   HGBUR LARGE (A) 08/14/2019 0921   BILIRUBINUR NEGATIVE 08/14/2019 0921   KETONESUR 5 (A) 08/14/2019 0921   PROTEINUR NEGATIVE 08/14/2019 0921   NITRITE NEGATIVE 08/14/2019 0921   LEUKOCYTESUR TRACE (A) 08/14/2019 0921    Radiological Exams on Admission: Ct Head Wo Contrast  Result Date: 08/14/2019 CLINICAL DATA:  Altered level of consciousness EXAM: CT HEAD WITHOUT CONTRAST TECHNIQUE: Contiguous axial images were obtained from the base of the skull through the vertex without intravenous contrast. COMPARISON:  Head CT dated 08/08/2019 FINDINGS: Brain: No evidence of acute infarction, hemorrhage, hydrocephalus, extra-axial collection or mass lesion/mass effect. There is moderate cerebral volume loss with associated ex vacuo dilatation. Periventricular white matter hypoattenuation  likely represents chronic small vessel ischemic disease. Vascular: There are vascular calcifications in the carotid siphons. Skull: Normal. Negative for fracture or focal lesion. Sinuses/Orbits: No acute finding. Other: None. IMPRESSION: No acute intracranial process. Electronically Signed   By: Romona Curls M.D.   On: 08/14/2019 10:22   Dg Chest Portable 1 View  Result Date: 08/14/2019 CLINICAL DATA:  Altered mental status EXAM: PORTABLE CHEST 1 VIEW COMPARISON:  Chest radiograph dated 08/08/2019 FINDINGS: The heart is enlarged accounting for technique. There is mild left basilar atelectasis/airspace disease. The left costophrenic angle is obscured and a small left pleural effusion cannot be excluded. There is no right pleural effusion or pneumothorax. The osseous structures are intact. IMPRESSION: 1. Mild left basilar atelectasis/airspace disease. A small left pleural effusion cannot be excluded. 2. Cardiomegaly. Electronically Signed   By: Romona Curls M.D.   On: 08/14/2019 10:18    EKG: Independently reviewed.  Sinus bradycardia at 46 bpm.  Assessment/Plan Active Problems:   Acute encephalopathy    Acute on chronic encephalopathy suspect metabolic secondary to UTI/AKI -This is in the setting of dementia with recent discharge 11/19 -Continue close monitoring with neuro checks and treat as noted below until further improvement  Enterococcus faecalis UTI -Patient discharged on nitrofurantoin recently -We will maintain on IV Rocephin for now until mentation improves -Follow blood cultures  AKI on CKD stage III -Recent creatinine on discharge noted to be 1.19 -Continue on IV fluid -Monitor I's and O's -Avoid nephrotoxic agents  Bradycardia -Continue close monitoring on telemetry -Patient is DNR -Recent TSH 7.216 and cortisol 22.4 -Not a candidate for PPM and no AV block noted -We will consider atropine as needed  History of DVT -Plan to change from Xarelto to Eliquis renally  dosed on account of AKI  Dementia with behavioral disturbances -Continue on Namenda with Aricept recently discontinued due to bradycardia  Hypothyroidism -Continue Synthroid -Recent TSH 7.216  Urinary retention -Foley removed on recent discharge and will continue to monitor bladder scan  DVT prophylaxis: Eliquis-renal dose Code Status: DNR Family Communication: Discussed with daughter on phone Disposition Plan: Admit for treatment of AKI and UTI which are likely contributing to encephalopathy in the setting of dementia.  Will need palliative consultation by Monday. Consults called: None Admission status: Inpatient, telemetry   Callie Facey D Carrell Palmatier DO [facility]  If 7PM-7AM, please contact night-coverage [email or web address]  08/14/2019, 12:08 PM

## 2019-08-14 NOTE — ED Notes (Signed)
Date and time results received: 08/14/19 1054  Test: p02 Critical Value: < 31  Name of Provider Notified: Dr. Tomi Bamberger  Orders Received? Or Actions Taken?: See chart

## 2019-08-14 NOTE — ED Notes (Signed)
Stroke swallow screen attempted again.  Pt asking for water.  Pt given water.

## 2019-08-14 NOTE — ED Provider Notes (Signed)
[facility] EMERGENCY DEPARTMENT Provider Note   CSN: [id number] Arrival date & time: 08/14/19  [id number]     History   Chief Complaint Chief Complaint  Patient presents with  . Altered Mental Status    HPI Cindi CarbonJoe Brickman is a 85 y.o. male.     HPI Patient presents to the ED for evaluation of altered mental status.  Patient was recently admitted to [facility] on November 15 and was discharged on November 19.  Patient was brought to the ED for altered mental status at that time.  According to the notes from that visit the patient at baseline was able to feed himself follow commands and interact with staff.  On that day the patient had a syncopal episode.  Ultimately the patient was diagnosed with sepsis secondary to a urinary tract infection.  On November 18 it was noted the patient was nearing his baseline and on November 19 was more alert.  According to the EMS report patient was sent from the nursing facility for evaluation of altered mental status.  The symptoms started in the last day or so.  Patient has been minimally responsive.  EMS reported the nursing home indicated the patient was even opening his eyes although he would open his eyes when EMS arrived.  Patient has not been following commands.  He has not been speaking. Past Medical History:  Diagnosis Date  . Alzheimer disease (HCC)   . DVT (deep venous thrombosis) (HCC)   . Malnourished [facility](HCC)     Patient Active Problem List   Diagnosis Date Noted  . Sepsis due to Enterococcus (HCC) 08/11/2019  . Acute metabolic encephalopathy 08/11/2019  . Syncope and collapse 08/11/2019  . Syncope   . Sepsis secondary to UTI (HCC) 08/08/2019  . Dementia without behavioral disturbance (HCC) 08/08/2019  . Acute renal failure superimposed on stage 3b chronic kidney disease (HCC) 08/08/2019  . Lactic acidosis 08/08/2019  . Incontinence 08/08/2019    Past Surgical History:  Procedure Laterality Date  . APPENDECTOMY    . BALLOON  DILATION N/A 01/15/2018   Procedure: BALLOON DILATION;  Surgeon: Malissa Hippoehman, Najeeb U, MD;  [facility];  Service: Endoscopy;  Laterality: N/A;  . ERCP N/A 01/15/2018   Procedure: ENDOSCOPIC RETROGRADE CHOLANGIOPANCREATOGRAPHY (ERCP);  Surgeon: Malissa Hippoehman, Najeeb U, MD;  [facility];  Service: Endoscopy;  Laterality: N/A;  . REMOVAL OF STONES N/A 01/15/2018   Procedure: REMOVAL OF STONES;  Surgeon: Malissa Hippoehman, Najeeb U, MD;  [facility];  Service: Endoscopy;  Laterality: N/A;  . SPHINCTEROTOMY N/A 01/15/2018   Procedure: SPHINCTEROTOMY;  Surgeon: Malissa Hippoehman, Najeeb U, MD;  [facility];  Service: Endoscopy;  Laterality: N/A;  . TONSILLECTOMY          Home Medications    Prior to Admission medications   Medication Sig Start Date End Date Taking? Authorizing Provider  levothyroxine (SYNTHROID) 75 MCG tablet Take 0.5 tablets (37.5 mcg total) by mouth daily at 6 (six) AM. 08/13/19   Tat, Onalee Huaavid, MD  memantine (NAMENDA) 10 MG tablet Take 10 mg by mouth 2 (two) times daily.    [provider]  nitrofurantoin, macrocrystal-monohydrate, (MACROBID) 100 MG capsule Take 1 capsule (100 mg total) by mouth every 12 (twelve) hours. X 5 days 08/12/19   Tat, Onalee Huaavid, MD  OLANZapine (ZYPREXA) 2.5 MG tablet Take 2.5 mg by mouth at bedtime.    [provider]  polyethylene glycol (MIRALAX / GLYCOLAX) 17 g packet Take 17 g  by mouth daily.    [provider]  rivaroxaban (XARELTO) 20 MG TABS tablet Take 20 mg by mouth daily with supper.    [provider]  senna (SENOKOT) 8.6 MG tablet Take 1 tablet by mouth daily.    [provider]  vitamin B-12 (CYANOCOBALAMIN) 500 MCG tablet Take 500 mcg by mouth daily.    [provider]    Family History History reviewed. No pertinent family history.  Social History Social History   Tobacco Use  . Smoking status: Never Smoker  . Smokeless tobacco: Never Used  Substance Use Topics  .  Alcohol use: Not Currently  . Drug use: Not Currently     Allergies   Penicillins   Review of Systems Review of Systems  Unable to perform ROS: Mental status change     Physical Exam Updated Vital Signs BP 113/85   Pulse (!) 43   Temp 98.2 F (36.8 C) (Rectal)   SpO2 95%   Physical Exam Vitals signs and nursing note reviewed.  Constitutional:      General: He is not in acute distress.    Appearance: He is underweight.     Comments: Elderly, frail  HENT:     Head: Normocephalic and atraumatic.     Right Ear: External ear normal.     Left Ear: External ear normal.  Eyes:     General: No scleral icterus.       Right eye: No discharge.        Left eye: No discharge.     Conjunctiva/sclera: Conjunctivae normal.  Neck:     Musculoskeletal: Neck supple.     Trachea: No tracheal deviation.  Cardiovascular:     Rate and Rhythm: Regular rhythm. Bradycardia present.  Pulmonary:     Effort: Pulmonary effort is normal. No respiratory distress.     Breath sounds: Normal breath sounds. No stridor. No wheezing or rales.  Abdominal:     General: Bowel sounds are normal. There is no distension.     Palpations: Abdomen is soft.     Tenderness: There is no abdominal tenderness. There is no guarding or rebound.  Musculoskeletal:        General: No tenderness.     Right lower leg: No edema.     Left lower leg: No edema.  Skin:    General: Skin is warm and dry.     Findings: No rash.  Neurological:     Cranial Nerves: No cranial nerve deficit (no facial droop,  ).     Sensory: No sensory deficit.     Motor: Abnormal muscle tone present. No tremor or seizure activity.     Comments:   He did open his eyes to stimuli, patient will not open his eyes when spoken to, he does not follow commands, unable to assess sensation and motor function, when arms lifted patient  holds them in place      ED Treatments / Results  Labs (all labs ordered are listed, but only abnormal results  are displayed) Labs Reviewed  CBC - Abnormal; Notable for the following components:      Result Value   Platelets 143 (*)    All other components within normal limits  COMPREHENSIVE METABOLIC PANEL - Abnormal; Notable for the following components:   Creatinine, Ser 2.00 (*)    Albumin 3.3 (*)    GFR calc non Af Amer 30 (*)    GFR calc Af Amer 34 (*)  All other components within normal limits  URINALYSIS, ROUTINE W REFLEX MICROSCOPIC - Abnormal; Notable for the following components:   APPearance HAZY (*)    Hgb urine dipstick LARGE (*)    Ketones, ur 5 (*)    Leukocytes,Ua TRACE (*)    RBC / HPF >50 (*)    Bacteria, UA RARE (*)    All other components within normal limits  BLOOD GAS, VENOUS - Abnormal; Notable for the following components:   pO2, Ven <31.0 (*)    Acid-Base Excess 2.3 (*)    All other components within normal limits  URINE CULTURE  SARS CORONAVIRUS 2 (TAT 6-24 HRS)  ETHANOL  PROTIME-INR  APTT  DIFFERENTIAL  RAPID URINE DRUG SCREEN, HOSP PERFORMED  AMMONIA    EKG EKG Interpretation  Date/Time:  Saturday August 14 2019 09:10:35 EST Ventricular Rate:  46 PR Interval:    QRS Duration: 92 QT Interval:  471 QTC Calculation: 412 R Axis:   23 Text Interpretation: Sinus bradycardia RSR' in V1 or V2, right VCD or RVH Nonspecific T abnormalities, lateral leads No significant change since last tracing Confirmed by Dorie Rank [phone]) on 08/14/2019 9:23:05 AM   Radiology Ct Head Wo Contrast  Result Date: 08/14/2019 CLINICAL DATA:  Altered level of consciousness EXAM: CT HEAD WITHOUT CONTRAST TECHNIQUE: Contiguous axial images were obtained from the base of the skull through the vertex without intravenous contrast. COMPARISON:  Head CT dated 08/08/2019 FINDINGS: Brain: No evidence of acute infarction, hemorrhage, hydrocephalus, extra-axial collection or mass lesion/mass effect. There is moderate cerebral volume loss with associated ex vacuo dilatation.  Periventricular white matter hypoattenuation likely represents chronic small vessel ischemic disease. Vascular: There are vascular calcifications in the carotid siphons. Skull: Normal. Negative for fracture or focal lesion. Sinuses/Orbits: No acute finding. Other: None. IMPRESSION: No acute intracranial process. Electronically Signed   By: Zerita Boers M.D.   On: 08/14/2019 10:22   Dg Chest Portable 1 View  Result Date: 08/14/2019 CLINICAL DATA:  Altered mental status EXAM: PORTABLE CHEST 1 VIEW COMPARISON:  Chest radiograph dated 08/08/2019 FINDINGS: The heart is enlarged accounting for technique. There is mild left basilar atelectasis/airspace disease. The left costophrenic angle is obscured and a small left pleural effusion cannot be excluded. There is no right pleural effusion or pneumothorax. The osseous structures are intact. IMPRESSION: 1. Mild left basilar atelectasis/airspace disease. A small left pleural effusion cannot be excluded. 2. Cardiomegaly. Electronically Signed   By: Zerita Boers M.D.   On: 08/14/2019 10:18    Procedures Procedures (including critical care time)  Medications Ordered in ED Medications  sodium chloride 0.9 % bolus 500 mL (0 mLs Intravenous Stopped 08/14/19 1103)    Followed by  0.9 %  sodium chloride infusion (100 mL/hr Intravenous Rate/Dose Change 08/14/19 1103)  cefTRIAXone (ROCEPHIN) 1 g in sodium chloride 0.9 % 100 mL IVPB (has no administration in time range)     Initial Impression / Assessment and Plan / ED Course  I have reviewed the triage vital signs and the nursing notes.  Pertinent labs & imaging results that were available during my care of the patient were reviewed by me and considered in my medical decision making (see chart for details).  Clinical Course as of Aug 14 1115  Sat Aug 14, 2019  1057 Rocephin ordered for UTI.  Previous records reviewed.  Patient has tolerated cephalosporins in the past.   [JK]  1108 Creatinine elevated  compared to previous values.   [JK]  1110  X-ray and CT without acute findings.   [JK]  1113 Episodes of heart rate down in the low 40s continue blood pressure remained stable   [JK]    Clinical Course User Index [JK] Linwood Dibbles, MD     Patient presents with worsening mental status.  In the ED patient is minimally responsive.  He opens his eyes to voice but does not follow commands or speak to me and answer any questions.  The patient's laboratory tests are notable for urinary tract infection.  He has been taking Macrobid as an outpatient.  He does have evidence of AKI.  Patient is also noted to be bradycardic.  Suspect the altered mental status is multifactorial but likely related to the urinary tract infection.  Considering his bradycardia, AKI, acute mental status change in the urinary tract infection I will consult the medical service for admission and further treatment.  Final Clinical Impressions(s) / ED Diagnoses   Final diagnoses:  Metabolic encephalopathy  Acute cystitis without hematuria  AKI (acute kidney injury) (HCC)  Bradycardia      Linwood Dibbles, MD 08/14/19 1116

## 2019-08-14 NOTE — ED Triage Notes (Signed)
Pt from [facility] . Recently discharged, pt sent over for altered mental status and failure to thrive. HR in 40's.

## 2019-08-14 NOTE — ED Notes (Signed)
Admission assessment done gleaning information from [facility] pt information packet. Pt has AMS and is not able to answer questions.

## 2019-08-14 NOTE — ED Notes (Signed)
ED Provider at bedside. 

## 2019-08-15 LAB — BASIC METABOLIC PANEL
Anion gap: 7 (ref 5–15)
BUN: 16 mg/dL (ref 8–23)
CO2: 25 mmol/L (ref 22–32)
Calcium: 8.9 mg/dL (ref 8.9–10.3)
Chloride: 116 mmol/L — ABNORMAL HIGH (ref 98–111)
Creatinine, Ser: 1.37 mg/dL — ABNORMAL HIGH (ref 0.61–1.24)
GFR calc Af Amer: 54 mL/min — ABNORMAL LOW (ref >60)
GFR calc non Af Amer: 47 mL/min — ABNORMAL LOW (ref >60)
Glucose, Bld: 83 mg/dL (ref 70–99)
Potassium: 4.1 mmol/L (ref 3.5–5.1)
Sodium: 148 mmol/L — ABNORMAL HIGH (ref 135–145)

## 2019-08-15 LAB — CBC
HCT: 39.2 % (ref 39.0–52.0)
Hemoglobin: 12.7 g/dL — ABNORMAL LOW (ref 13.0–17.0)
MCH: 30.1 pg (ref 26.0–34.0)
MCHC: 32.4 g/dL (ref 30.0–36.0)
MCV: 92.9 fL (ref 80.0–100.0)
Platelets: 121 10*3/uL — ABNORMAL LOW (ref 150–400)
RBC: 4.22 MIL/uL (ref 4.22–5.81)
RDW: 14.8 % (ref 11.5–15.5)
WBC: 3.6 10*3/uL — ABNORMAL LOW (ref 4.0–10.5)
nRBC: 0 % (ref 0.0–0.2)

## 2019-08-15 LAB — MRSA PCR SCREENING: MRSA by PCR: NEGATIVE

## 2019-08-15 LAB — SARS CORONAVIRUS 2 (TAT 6-24 HRS): SARS Coronavirus 2: NEGATIVE

## 2019-08-15 LAB — MAGNESIUM: Magnesium: 1.7 mg/dL (ref 1.7–2.4)

## 2019-08-15 MED ORDER — SODIUM CHLORIDE 0.45 % IV SOLN
INTRAVENOUS | Status: AC
  Administered 2019-08-15: 12:00:00 via INTRAVENOUS

## 2019-08-15 NOTE — Progress Notes (Signed)
PROGRESS NOTE    Robert Hurst  IFO:[id number] DOB: September 04, 1932 DOA: 08/14/2019 PCP: Neale Burly, MD   Brief Narrative:  Per HPI: Robert Hurst is a 85 y.o. male with medical history significant for Alzheimer dementia with behavioral disturbances, CKD stage IIIb, prior history of DVT on Xarelto, urinary incontinence, and recent discharge after treatment for AKI and Enterococcus faecalis UTI on 11/19 who presented back from his facility with worsening mentation.  The symptoms have begun in the last day or so and he has remained minimally responsive.  It is unclear whether or not he has been eating or drinking much or even taking his antibiotics.  No other symptoms otherwise noted.  11/22: Patient was admitted with acute on chronic encephalopathy suspected secondary to metabolic causes of UTI and AKI.  He is noted to have Enterococcus faecalis UTI and has been started on Rocephin.  AKI appears to be improving with creatinine 1.37 today on IV fluid.  He no longer has bradycardia either.  He still remains quite somnolent.  Assessment & Plan:   Active Problems:   Acute encephalopathy   Acute on chronic encephalopathy suspect metabolic secondary to UTI/AKI -This is in the setting of dementia with recent discharge 11/19 -Continue close monitoring with neuro checks and treat as noted below until further improvement -Palliative consultation in a.m. for goals of care  Enterococcus faecalis UTI -Patient discharged on nitrofurantoin recently -We will maintain on IV Rocephin for now until mentation improves -Follow blood cultures  AKI on CKD stage III-improving -Recent creatinine on discharge noted to be 1.19 -Continue on IV fluid now with half-normal saline -Monitor I's and O's -Avoid nephrotoxic agents  Bradycardia-resolved -Continue close monitoring on telemetry -Patient is DNR -Recent TSH 7.216 and cortisol 22.4 -Not a candidate for PPM and no AV block noted -We will consider  atropine as needed  History of DVT -Plan to change from Xarelto to Eliquis renally dosed on account of AKI  Dementia with behavioral disturbances -Continue on Namenda with Aricept recently discontinued due to bradycardia  Hypothyroidism -Continue Synthroid -Recent TSH 7.216  Urinary retention -Foley removed on recent discharge and will continue to monitor bladder scan   DVT prophylaxis: Eliquis Code Status: DNR Family Communication: Discussed with daughter on 11/21 Disposition Plan: Continue IV fluid administration as well as antibiotics.  Potential plan to consider palliative consultation by a.m.   Consultants:   None  Procedures:   None  Antimicrobials:  Anti-infectives (From admission, onward)   Start     Dose/Rate Route Frequency Ordered Stop   08/15/19 1000  cefTRIAXone (ROCEPHIN) 1 g in sodium chloride 0.9 % 100 mL IVPB     1 g 200 mL/hr over 30 Minutes Intravenous Every 24 hours 08/14/19 1221     08/14/19 1100  cefTRIAXone (ROCEPHIN) 1 g in sodium chloride 0.9 % 100 mL IVPB     1 g 200 mL/hr over 30 Minutes Intravenous  Once 08/14/19 1057 08/14/19 1256       Subjective: Patient seen and evaluated today with ongoing somnolence.  Kidney injury appears improved today.  He does not respond very much.  Objective: Vitals:   08/14/19 1930 08/14/19 2007 08/14/19 2300 08/15/19 0641  BP: 109/68 111/67  114/68  Pulse: (!) 50 (!) 45  80  Resp: 12 14    Temp:  98.3 F (36.8 C)  97.6 F (36.4 C)  TempSrc:  Oral  Oral  SpO2: 96% 100%  98%  Weight:  51.7 kg  Height:    (1.626 m)     Intake/Output Summary (Last 24 hours) at 08/15/2019 1201 Last data filed at 08/15/2019 0900 Gross per 24 hour  Intake 2168.03 ml  Output 300 ml  Net 1868.03 ml   Filed Weights   08/14/19 2007  Weight: 51.7 kg    Examination:  General exam: Appears calm and comfortable, somnolent Respiratory system: Clear to auscultation. Respiratory effort normal.  Currently on  nasal cannula oxygen Cardiovascular system: S1 & S2 heard, RRR. No JVD, murmurs, rubs, gallops or clicks. No pedal edema. Gastrointestinal system: Abdomen is nondistended, soft and nontender. No organomegaly or masses felt. Normal bowel sounds heard. Central nervous system: Somnolent Extremities: Symmetric 5 x 5 power. Skin: No rashes, lesions or ulcers Psychiatry: Cannot be assessed given patient condition    Data Reviewed: I have personally reviewed following labs and imaging studies  CBC: Recent Labs  Lab 08/09/19 0519 08/14/19 0937 08/15/19 0748  WBC 4.6 4.8 3.6*  NEUTROABS  --  2.4  --   HGB 12.8* 13.9 12.7*  HCT 38.7* 42.1 39.2  MCV 91.3 91.3 92.9  PLT 123* 143* 121*   Basic Metabolic Panel: Recent Labs  Lab 08/09/19 0519 08/12/19 0500 08/14/19 0937 08/15/19 0748  NA 146* 143 145 148*  K 3.6 3.1* 4.0 4.1  CL 113* 111 108 116*  CO2 GLUCOSE 80 93 92 83  BUN 10 5* 20 16  CREATININE 1.22 1.19 2.00* 1.37*  CALCIUM 8.5* 8.6* 9.2 8.9  MG  --   --   --  1.7   GFR: Estimated Creatinine Clearance: 28.8 mL/min (A) (by C-G formula based on SCr of 1.37 mg/dL (H)). Liver Function Tests: Recent Labs  Lab 08/14/19 0937  AST 41  ALT 17  ALKPHOS 68  BILITOT 1.1  PROT 6.9  ALBUMIN 3.3*   No results for input(s): LIPASE, AMYLASE in the last 168 hours. Recent Labs  Lab 08/14/19 0937  AMMONIA 14   Coagulation Profile: Recent Labs  Lab 08/14/19 0937  INR 1.1   Cardiac Enzymes: No results for input(s): CKTOTAL, CKMB, CKMBINDEX, TROPONINI in the last 168 hours. BNP (last 3 results) No results for input(s): PROBNP in the last 8760 hours. HbA1C: No results for input(s): HGBA1C in the last 72 hours. CBG: Recent Labs  Lab 08/10/19 1548  GLUCAP 118*   Lipid Profile: No results for input(s): CHOL, HDL, LDLCALC, TRIG, CHOLHDL, LDLDIRECT in the last 72 hours. Thyroid Function Tests: No results for input(s): TSH, T4TOTAL, FREET4, T3FREE, THYROIDAB in  the last 72 hours. Anemia Panel: No results for input(s): VITAMINB12, FOLATE, FERRITIN, TIBC, IRON, RETICCTPCT in the last 72 hours. Sepsis Labs: Recent Labs  Lab 08/08/19 1957  LATICACIDVEN 2.8*    Recent Results (from the past 240 hour(s))  SARS CORONAVIRUS 2 (TAT 6-24 HRS) Nasopharyngeal Nasopharyngeal Swab     Status: None   Collection Time: 08/08/19  6:22 AM   Specimen: Nasopharyngeal Swab  Result Value Ref Range Status   SARS Coronavirus 2 NEGATIVE NEGATIVE Final    Comment: (NOTE) SARS-CoV-2 target nucleic acids are NOT DETECTED. The SARS-CoV-2 RNA is generally detectable in upper and lower respiratory specimens during the acute phase of infection. Negative results do not preclude SARS-CoV-2 infection, do not rule out co-infections with other pathogens, and should not be used as the sole basis for treatment or other patient management decisions. Negative results must be combined with clinical observations, patient history, and epidemiological information. The  expected result is Negative. Fact Sheet for Patients: HairSlick.no Fact Sheet for Healthcare Providers: quierodirigir.com This test is not yet approved or cleared by the [place] FDA and  has been authorized for detection and/or diagnosis of SARS-CoV-2 by FDA under an Emergency Use Authorization (EUA). This EUA will remain  in effect (meaning this test can be used) for the duration of the COVID-19 declaration under Section 56 4(b)(1) of the Act, 21 U.S.C. section 360bbb-3(b)(1), unless the authorization is terminated or revoked sooner. Performed at [facility], [address], [place], [place] [address]   Culture, blood (routine x 2)     Status: Abnormal   Collection Time: 08/08/19  8:58 AM   Specimen: BLOOD RIGHT HAND  Result Value Ref Range Status   Specimen Description   Final    BLOOD RIGHT HAND BOTTLES DRAWN AEROBIC AND ANAEROBIC Performed  at [facility], [address], [place], [place] [address]    Special Requests   Final    Blood Culture adequate volume Performed at [facility], [address] [place], [place] [address]    Culture  Setup Time   Final    GRAM POSITIVE COCCI AEROBIC BOTTLE ONLY Gram Stain Report Called to,Read Back By and Verified With: HEATHER E.,RN @0657  08/09/2019 KAY Performed at [facility], [address] [place], [place]    Culture (A)  Final    STAPHYLOCOCCUS SPECIES (COAGULASE NEGATIVE) THE SIGNIFICANCE OF ISOLATING THIS ORGANISM FROM A SINGLE SET OF BLOOD CULTURES WHEN MULTIPLE SETS ARE DRAWN IS UNCERTAIN. PLEASE NOTIFY THE MICROBIOLOGY DEPARTMENT WITHIN ONE WEEK IF SPECIATION AND SENSITIVITIES ARE REQUIRED. Performed at [facility], [address], [place], [place]    Report Status 08/10/2019 FINAL  Final  Culture, blood (routine x 2)     Status: None   Collection Time: 08/08/19  9:01 AM   Specimen: Left Antecubital; Blood  Result Value Ref Range Status   Specimen Description   Final    LEFT ANTECUBITAL BOTTLES DRAWN AEROBIC AND ANAEROBIC   Special Requests Blood Culture adequate volume  Final   Culture   Final    NO GROWTH 5 DAYS Performed at [facility], [address] [place], [place]    Report Status 08/13/2019 FINAL  Final  Urine culture     Status: Abnormal   Collection Time: 08/08/19  5:00 PM   Specimen: Urine, Catheterized  Result Value Ref Range Status   Specimen Description   Final    URINE, CATHETERIZED Performed at [facility], [address], [place], [place]    Special Requests   Final    NONE Performed at [facility], [address] [place], [place]    Culture >=100,000 COLONIES/mL ENTEROCOCCUS FAECALIS (A)  Final   Report Status 08/11/2019 FINAL  Final   Organism ID, Bacteria ENTEROCOCCUS FAECALIS (A)  Final      Susceptibility   Enterococcus faecalis - MIC*    AMPICILLIN <=2 SENSITIVE Sensitive      LEVOFLOXACIN 1 SENSITIVE Sensitive     NITROFURANTOIN <=16 SENSITIVE Sensitive     VANCOMYCIN 1 SENSITIVE Sensitive     * >=100,000 COLONIES/mL ENTEROCOCCUS FAECALIS  SARS CORONAVIRUS 2 (TAT 6-24 HRS) Nasopharyngeal Nasopharyngeal Swab     Status: None   Collection Time: 08/14/19 11:10 AM   Specimen: Nasopharyngeal Swab  Result Value Ref Range Status   SARS Coronavirus 2 NEGATIVE NEGATIVE Final    Comment: (NOTE) SARS-CoV-2 target  nucleic acids are NOT DETECTED. The SARS-CoV-2 RNA is generally detectable in upper and lower respiratory specimens during the acute phase of infection. Negative results do not preclude SARS-CoV-2 infection, do not rule out co-infections with other pathogens, and should not be used as the sole basis for treatment or other patient management decisions. Negative results must be combined with clinical observations, patient history, and epidemiological information. The expected result is Negative. Fact Sheet for Patients: HairSlick.nohttps://www.fda.gov/media/138098/download Fact Sheet for Healthcare Providers: quierodirigir.comhttps://www.fda.gov/media/138095/download This test is not yet approved or cleared by the [place] FDA and  has been authorized for detection and/or diagnosis of SARS-CoV-2 by FDA under an Emergency Use Authorization (EUA). This EUA will remain  in effect (meaning this test can be used) for the duration of the COVID-19 declaration under Section 56 4(b)(1) of the Act, 21 U.S.C. section 360bbb-3(b)(1), unless the authorization is terminated or revoked sooner. Performed at [facility], [address] [place], [place] [address]   MRSA PCR Screening     Status: None   Collection Time: 08/15/19 12:05 AM   Specimen: Nasal Mucosa; Nasopharyngeal  Result Value Ref Range Status   MRSA by PCR NEGATIVE NEGATIVE Final    Comment:        The GeneXpert MRSA Assay (FDA approved for NASAL specimens only), is one component of a comprehensive MRSA colonization  surveillance program. It is not intended to diagnose MRSA infection nor to guide or monitor treatment for MRSA infections. Performed at [facility], [address] [place], [place] [address]          Radiology Studies: Ct Head Wo Contrast  Result Date: 08/14/2019 CLINICAL DATA:  Altered level of consciousness EXAM: CT HEAD WITHOUT CONTRAST TECHNIQUE: Contiguous axial images were obtained from the base of the skull through the vertex without intravenous contrast. COMPARISON:  Head CT dated 08/08/2019 FINDINGS: Brain: No evidence of acute infarction, hemorrhage, hydrocephalus, extra-axial collection or mass lesion/mass effect. There is moderate cerebral volume loss with associated ex vacuo dilatation. Periventricular white matter hypoattenuation likely represents chronic small vessel ischemic disease. Vascular: There are vascular calcifications in the carotid siphons. Skull: Normal. Negative for fracture or focal lesion. Sinuses/Orbits: No acute finding. Other: None. IMPRESSION: No acute intracranial process. Electronically Signed   By: Romona Curlsyler  Litton M.D.   On: 08/14/2019 10:22   Dg Chest Portable 1 View  Result Date: 08/14/2019 CLINICAL DATA:  Altered mental status EXAM: PORTABLE CHEST 1 VIEW COMPARISON:  Chest radiograph dated 08/08/2019 FINDINGS: The heart is enlarged accounting for technique. There is mild left basilar atelectasis/airspace disease. The left costophrenic angle is obscured and a small left pleural effusion cannot be excluded. There is no right pleural effusion or pneumothorax. The osseous structures are intact. IMPRESSION: 1. Mild left basilar atelectasis/airspace disease. A small left pleural effusion cannot be excluded. 2. Cardiomegaly. Electronically Signed   By: Romona Curlsyler  Litton M.D.   On: 08/14/2019 10:18        Scheduled Meds: . apixaban  2.5 mg Oral BID  . levothyroxine  37.5 mcg Oral Q0600  . memantine  5 mg Oral BID  . polyethylene glycol  17 g Oral Daily  .  senna  1 tablet Oral Daily  . thiamine injection  100 mg Intravenous Daily  . vitamin B-12  500 mcg Oral Daily   Continuous Infusions: . sodium chloride    . cefTRIAXone (ROCEPHIN)  IV 1 g (08/15/19 1142)     LOS: 1 day    Time spent:  30 minutes    Rithy Mandley Hoover Brunette, DO [facility] Pager [phone]  If 7PM-7AM, please contact night-coverage [email or web address] Password [email or web address] 08/15/2019, 12:01 PM

## 2019-08-16 DIAGNOSIS — Z7189 Other specified counseling: Secondary | ICD-10-CM

## 2019-08-16 DIAGNOSIS — G309 Alzheimer's disease, unspecified: Secondary | ICD-10-CM

## 2019-08-16 DIAGNOSIS — N3000 Acute cystitis without hematuria: Secondary | ICD-10-CM

## 2019-08-16 DIAGNOSIS — Z515 Encounter for palliative care: Secondary | ICD-10-CM

## 2019-08-16 DIAGNOSIS — F0280 Dementia in other diseases classified elsewhere without behavioral disturbance: Secondary | ICD-10-CM

## 2019-08-16 LAB — URINE CULTURE: Culture: NO GROWTH

## 2019-08-16 LAB — BASIC METABOLIC PANEL
Anion gap: 7 (ref 5–15)
BUN: 12 mg/dL (ref 8–23)
CO2: 26 mmol/L (ref 22–32)
Calcium: 8.7 mg/dL — ABNORMAL LOW (ref 8.9–10.3)
Chloride: 111 mmol/L (ref 98–111)
Creatinine, Ser: 1.32 mg/dL — ABNORMAL HIGH (ref 0.61–1.24)
GFR calc Af Amer: 57 mL/min — ABNORMAL LOW (ref >60)
GFR calc non Af Amer: 49 mL/min — ABNORMAL LOW (ref >60)
Glucose, Bld: 107 mg/dL — ABNORMAL HIGH (ref 70–99)
Potassium: 3.6 mmol/L (ref 3.5–5.1)
Sodium: 144 mmol/L (ref 135–145)

## 2019-08-16 NOTE — Consult Note (Signed)
Consultation Note Date: 08/16/2019   Patient Name: Robert Hurst  DOB: [birth year]  MRN: [id number]  Age / Sex: 85 y.o., male  PCP: Neale Burly, MD Referring Physician: Rodena Goldmann, DO  Reason for Consultation: Establishing goals of care and Hospice Evaluation  HPI/Patient Profile: 85 y.o. male  with past medical history of Alzheimer dementia, CKD stage 3, DVT on Xarelto, urinary incontinence, recent admission for AKI and UTI 08/08/19-08/12/19 admitted on 08/14/2019 with worsening mentation and recurrent UTI.   Clinical Assessment and Goals of Care: I met today with Robert Hurst. He is alert and cooperative. Oriented to person and place but not to situation. He mostly answers questions appropriately but with delayed responses and at times not able to answer.   I called and spoke with daughter, Robert Hurst. Robert Hurst is one of six children many of which are spread out and not local. Robert Hurst shares with me concern of her father's decline over past few months during [place]. He is isolated to his room and she does not believe he has been getting out of bed. He often shares about being thirsty and hungry and wanting food and water. Robert Hurst believes that he is able to feed himself but is unsure - if he cannot feed self than this would be a change. She is unsure about incontinence and if he is able to call for help. We discussed these issues of inability to feed self, incontinence, and decreased mobility that can add to decline and risk of recurrent infection. We discussed potential of hospice and how that can assist with care if his dementia has progressed over the coarse of this year.   Robert Hurst tells me that Robert Hurst was admitted to outside hospital 2 years ago after passing out from hypotension they believe was related to medication. He declined while on medication (she is unsure which medication this was) and they were ultimately  told that he was dying and he was sent to hospice care. He improved while at hospice and went to SNF for care at that time. Robert Hurst believes that hospice saved his life because they stopped all his medications and it was only then that he improved.   We agreed to see how he does today and speak further tomorrow regarding recommendations and allow her time to update her siblings as well.   Primary Decision Maker NEXT OF KIN 6 children (daughter Robert Hurst is main contact at this time)    SUMMARY OF RECOMMENDATIONS   - Consider palliative vs hospice to follow at [place]:  DNR   Symptom Management:   Per attending.   Palliative Prophylaxis:   Aspiration, Bowel Regimen, Delirium Protocol, Frequent Pain Assessment, Oral Care and Turn Reposition  Additional Recommendations (Limitations, Scope, Preferences):  Avoid Hospitalization  Psycho-social/Spiritual:   Desire for further Chaplaincy support:no  Additional Recommendations: Caregiving  Support/Resources and Education on Hospice  Prognosis:   Overall prognosis is poor.   Discharge Planning: To Be Determined      Primary Diagnoses: Present  on Admission: . Acute encephalopathy   I have reviewed the medical record, interviewed the patient and family, and examined the patient. The following aspects are pertinent.  Past Medical History:  Diagnosis Date  . Alzheimer disease ([place])   . DVT (deep venous thrombosis) ([place])   . Malnourished [facility])    Social History   Socioeconomic History  . Marital status: Widowed    Spouse name: Not on file  . Number of children: Not on file  . Years of education: Not on file  . Highest education level: Not on file  Occupational History  . Not on file  Social Needs  . Financial resource strain: Not on file  . Food insecurity    Worry: Not on file    Inability: Not on file  . Transportation needs    Medical: Not on file    Non-medical: Not on file   Tobacco Use  . Smoking status: Never Smoker  . Smokeless tobacco: Never Used  Substance and Sexual Activity  . Alcohol use: Not Currently  . Drug use: Not Currently  . Sexual activity: Not Currently  Lifestyle  . Physical activity    Days per week: Not on file    Minutes per session: Not on file  . Stress: Not on file  Relationships  . Social Herbalist on phone: Not on file    Gets together: Not on file    Attends religious service: Not on file    Active member of club or organization: Not on file    Attends meetings of clubs or organizations: Not on file    Relationship status: Not on file  Other Topics Concern  . Not on file  Social History Narrative  . Not on file   History reviewed. No pertinent family history. Scheduled Meds: . apixaban  2.5 mg Oral BID  . levothyroxine  37.5 mcg Oral Q0600  . memantine  5 mg Oral BID  . polyethylene glycol  17 g Oral Daily  . senna  1 tablet Oral Daily  . thiamine injection  100 mg Intravenous Daily  . vitamin B-12  500 mcg Oral Daily   Continuous Infusions: . sodium chloride 75 mL/hr at 08/15/19 1224  . cefTRIAXone (ROCEPHIN)  IV 1 g (08/15/19 1142)   PRN Meds:.acetaminophen **OR** acetaminophen, ondansetron **OR** ondansetron (ZOFRAN) IV Allergies  Allergen Reactions  . Penicillins     Has patient had a PCN reaction causing immediate rash, facial/tongue/throat swelling, SOB or lightheadedness with hypotension: Unknown Has patient had a PCN reaction causing severe rash involving mucus membranes or skin necrosis: Unknown Has patient had a PCN reaction that required hospitalization: Unknown Has patient had a PCN reaction occurring within the last 10 years: Unknown If all of the above answers are "NO", then may proceed with Cephalosporin use.    Review of Systems  Unable to perform ROS: Dementia    Physical Exam Vitals signs and nursing note reviewed.  Constitutional:      General: He is not in acute distress.     Appearance: He is ill-appearing.     Comments: Frail, elderly  Cardiovascular:     Rate and Rhythm: Normal rate.  Pulmonary:     Effort: Pulmonary effort is normal. No tachypnea, accessory muscle usage or respiratory distress.  Abdominal:     General: Abdomen is flat.     Palpations: Abdomen is soft.  Neurological:     Mental Status: He is alert. He is disoriented  and confused.     Vital Signs: BP 132/84 (BP Location: Left Arm)   Hurst 96   Temp 98.6 F (37 C) (Oral)   Resp 16   Ht '5\' 4"'  (1.626 m) Comment: pt stated  Wt 51.7 kg   SpO2 97%   BMI 19.56 kg/m  Pain Scale: 0-10   Pain Score: 0-No pain   SpO2: SpO2: 97 % O2 Device:SpO2: 97 % O2 Flow Rate: .   IO: Intake/output summary:   Intake/Output Summary (Last 24 hours) at 08/16/2019 2103 Last data filed at 08/16/2019 0900 Gross per 24 hour  Intake 443.07 ml  Output 350 ml  Net 93.07 ml    LBM: Last BM Date: (UTA) Baseline Weight: Weight: 51.7 kg Most recent weight: Weight: 51.7 kg     Palliative Assessment/Data:     Time In: 1150 Time Out: 1240 Time Total: 50 min Greater than 50%  of this time was spent counseling and coordinating care related to the above assessment and plan.  Signed by: Vinie Sill, NP Palliative Medicine Team Pager # [phone] (M-F 8a-5p) Team Phone # [phone] (Nights/Weekends)

## 2019-08-16 NOTE — Progress Notes (Signed)
PROGRESS NOTE    Robert Hurst  VHQ:[id number] DOB: 1931/10/18 DOA: 08/14/2019 PCP: Neale Burly, MD   Brief Narrative:  Per HPI: Robert Perkinsis a 85 y.o.malewith medical history significant forAlzheimer dementia with behavioral disturbances, CKD stage IIIb, prior history of DVT on Xarelto, urinary incontinence, and recent discharge after treatment for AKI and Enterococcus faecalis UTI on 11/19 who presented back from his facility with worsening mentation. The symptoms have begun in the last day or so and he has remained minimally responsive. It is unclear whether or not he has been eating or drinking much or even taking his antibiotics. No other symptoms otherwise noted.  11/22: Patient was admitted with acute on chronic encephalopathy suspected secondary to metabolic causes of UTI and AKI.  He is noted to have Enterococcus faecalis UTI and has been started on Rocephin.  AKI appears to be improving with creatinine 1.37 today on IV fluid.  He no longer has bradycardia either.  He still remains quite somnolent.  11/23: Patient appears to be more alert and awake today.  His repeat urine cultures are still pending.  Creatinine stable at 1.32.  Okay to start diet today.  Palliative care consultation pending.  Assessment & Plan:   Active Problems:   Acute encephalopathy   Acute on chronic encephalopathy suspect metabolic secondary to UTI/AKI -This is in the setting of dementia with recent discharge 11/19 -Continue close monitoring with neuro checks and treat as noted below until further improvement -Palliative consultation in a.m. for goals of care appreciated  Enterococcus faecalis UTI -Patient discharged on nitrofurantoin recently -We will maintain on IV Rocephin for now and follow repeat urine cultures  AKI on CKD stage III-improving -Recent creatinine on discharge noted to be 1.19 -Continue on IV fluid now with half-normal saline for another 12 hours -Repeat a.m. labs  -Monitor I's and O's -Avoid nephrotoxic agents  Bradycardia-resolved -Continue close monitoring on telemetry -Patient is DNR -Recent TSH 7.216 and cortisol 22.4 -Not a candidate for PPM and no AV block noted -We will consider atropine as needed  History of DVT -Plan to change from Xarelto to Eliquis renally dosed on account of AKI  Dementia with behavioral disturbances -Continue on Namenda with Aricept recently discontinued due to bradycardia  Hypothyroidism -Continue Synthroid -Recent TSH 7.216  Urinary retention -Foley removed on recent discharge and will continue to monitor bladder scan   DVT prophylaxis: Eliquis Code Status: DNR Family Communication: Discussed with daughter on 11/21 Disposition Plan: Continue IV fluid administration as well as antibiotics.  Appreciate palliative care evaluation as patient may benefit from hospice services soon.   Consultants:   None  Procedures:   None  Antimicrobials:  Anti-infectives (From admission, onward)   Start     Dose/Rate Route Frequency Ordered Stop   08/15/19 1000  cefTRIAXone (ROCEPHIN) 1 g in sodium chloride 0.9 % 100 mL IVPB     1 g 200 mL/hr over 30 Minutes Intravenous Every 24 hours 08/14/19 1221     08/14/19 1100  cefTRIAXone (ROCEPHIN) 1 g in sodium chloride 0.9 % 100 mL IVPB     1 g 200 mL/hr over 30 Minutes Intravenous  Once 08/14/19 1057 08/14/19 1256       Subjective: Patient seen and evaluated today with no new acute complaints or concerns. No acute concerns or events noted overnight.  He is more awake and alert today.  He has passed a swallow screen.  Objective: Vitals:   08/15/19 1307 08/15/19 2123 08/16/19 0525 08/16/19 4132  BP: (!) 144/72 121/70 132/84   Pulse: (!) 46 (!) 45 (!) 43 96  Resp:  20 16   Temp: 97.8 F (36.6 C) 98.7 F (37.1 C) 98.6 F (37 C)   TempSrc: Oral Oral Oral   SpO2: 100% 96% (!) 44% 97%  Weight:      Height:        Intake/Output Summary (Last 24  hours) at 08/16/2019 1054 Last data filed at 08/16/2019 0900 Gross per 24 hour  Intake 443.07 ml  Output 350 ml  Net 93.07 ml   Filed Weights   08/14/19 2007  Weight: 51.7 kg    Examination:  General exam: Appears calm and comfortable  Respiratory system: Clear to auscultation. Respiratory effort normal. Cardiovascular system: S1 & S2 heard, RRR. No JVD, murmurs, rubs, gallops or clicks. No pedal edema. Gastrointestinal system: Abdomen is nondistended, soft and nontender. No organomegaly or masses felt. Normal bowel sounds heard. Central nervous system: Alert and awake Extremities: Symmetric 5 x 5 power. Skin: No rashes, lesions or ulcers Psychiatry: Judgement and insight appear normal. Mood & affect appropriate.     Data Reviewed: I have personally reviewed following labs and imaging studies  CBC: Recent Labs  Lab 08/14/19 0937 08/15/19 0748  WBC 4.8 3.6*  NEUTROABS 2.4  --   HGB 13.9 12.7*  HCT 42.1 39.2  MCV 91.3 92.9  PLT 143* 121*   Basic Metabolic Panel: Recent Labs  Lab 08/12/19 0500 08/14/19 0937 08/15/19 0748 08/16/19 0624  NA 143 145 148* 144  K 3.1* 4.0 4.1 3.6  CL 111 108 116* 111  CO2 23 27 25 26   GLUCOSE 93 92 83 107*  BUN 5* 20 16 12   CREATININE 1.19 2.00* 1.37* 1.32*  CALCIUM 8.6* 9.2 8.9 8.7*  MG  --   --  1.7  --    GFR: Estimated Creatinine Clearance: 29.9 mL/min (A) (by C-G formula based on SCr of 1.32 mg/dL (H)). Liver Function Tests: Recent Labs  Lab 08/14/19 0937  AST 41  ALT 17  ALKPHOS 68  BILITOT 1.1  PROT 6.9  ALBUMIN 3.3*   No results for input(s): LIPASE, AMYLASE in the last 168 hours. Recent Labs  Lab 08/14/19 0937  AMMONIA 14   Coagulation Profile: Recent Labs  Lab 08/14/19 0937  INR 1.1   Cardiac Enzymes: No results for input(s): CKTOTAL, CKMB, CKMBINDEX, TROPONINI in the last 168 hours. BNP (last 3 results) No results for input(s): PROBNP in the last 8760 hours. HbA1C: No results for input(s):  HGBA1C in the last 72 hours. CBG: Recent Labs  Lab 08/10/19 1548  GLUCAP 118*   Lipid Profile: No results for input(s): CHOL, HDL, LDLCALC, TRIG, CHOLHDL, LDLDIRECT in the last 72 hours. Thyroid Function Tests: No results for input(s): TSH, T4TOTAL, FREET4, T3FREE, THYROIDAB in the last 72 hours. Anemia Panel: No results for input(s): VITAMINB12, FOLATE, FERRITIN, TIBC, IRON, RETICCTPCT in the last 72 hours. Sepsis Labs: No results for input(s): PROCALCITON, LATICACIDVEN in the last 168 hours.  Recent Results (from the past 240 hour(s))  SARS CORONAVIRUS 2 (TAT 6-24 HRS) Nasopharyngeal Nasopharyngeal Swab     Status: None   Collection Time: 08/08/19  6:22 AM   Specimen: Nasopharyngeal Swab  Result Value Ref Range Status   SARS Coronavirus 2 NEGATIVE NEGATIVE Final    Comment: (NOTE) SARS-CoV-2 target nucleic acids are NOT DETECTED. The SARS-CoV-2 RNA is generally detectable in upper and lower respiratory specimens during the acute phase of infection. Negative results  do not preclude SARS-CoV-2 infection, do not rule out co-infections with other pathogens, and should not be used as the sole basis for treatment or other patient management decisions. Negative results must be combined with clinical observations, patient history, and epidemiological information. The expected result is Negative. Fact Sheet for Patients: HairSlick.no Fact Sheet for Healthcare Providers: quierodirigir.com This test is not yet approved or cleared by the [place] FDA and  has been authorized for detection and/or diagnosis of SARS-CoV-2 by FDA under an Emergency Use Authorization (EUA). This EUA will remain  in effect (meaning this test can be used) for the duration of the COVID-19 declaration under Section 56 4(b)(1) of the Act, 21 U.S.C. section 360bbb-3(b)(1), unless the authorization is terminated or revoked sooner. Performed at [facility], [address] [place], [place] [address]   Culture, blood (routine x 2)     Status: Abnormal   Collection Time: 08/08/19  8:58 AM   Specimen: BLOOD RIGHT HAND  Result Value Ref Range Status   Specimen Description   Final    BLOOD RIGHT HAND BOTTLES DRAWN AEROBIC AND ANAEROBIC Performed at [facility], [address], [place], [place] [address]    Special Requests   Final    Blood Culture adequate volume Performed at [facility], [address], [place], [place] [address]    Culture  Setup Time   Final    GRAM POSITIVE COCCI AEROBIC BOTTLE ONLY Gram Stain Report Called to,Read Back By and Verified With: HEATHER E.,RN  08/09/2019 KAY Performed at [facility], [address], [place], [place] [address]    Culture (A)  Final    STAPHYLOCOCCUS SPECIES (COAGULASE NEGATIVE) THE SIGNIFICANCE OF ISOLATING THIS ORGANISM FROM A SINGLE SET OF BLOOD CULTURES WHEN MULTIPLE SETS ARE DRAWN IS UNCERTAIN. PLEASE NOTIFY THE MICROBIOLOGY DEPARTMENT WITHIN ONE WEEK IF SPECIATION AND SENSITIVITIES ARE REQUIRED. Performed at [facility], [address] [place], [place] [address]    Report Status 08/10/2019 FINAL  Final  Culture, blood (routine x 2)     Status: None   Collection Time: 08/08/19  9:01 AM   Specimen: Left Antecubital; Blood  Result Value Ref Range Status   Specimen Description   Final    LEFT ANTECUBITAL BOTTLES DRAWN AEROBIC AND ANAEROBIC   Special Requests Blood Culture adequate volume  Final   Culture   Final    NO GROWTH 5 DAYS Performed at [facility], [address] [place], [place] [address]    Report Status 08/13/2019 FINAL  Final  Urine culture     Status: Abnormal   Collection Time: 08/08/19  5:00 PM   Specimen: Urine, Catheterized  Result Value Ref Range Status   Specimen Description   Final    URINE, CATHETERIZED Performed at [facility], [address] [place], [place] [address]    Special Requests   Final    NONE Performed at [facility], [address], [place], [place] [address]    Culture >=100,000 COLONIES/mL ENTEROCOCCUS FAECALIS (A)  Final   Report Status 08/11/2019 FINAL  Final   Organism ID, Bacteria ENTEROCOCCUS FAECALIS (A)  Final      Susceptibility   Enterococcus faecalis - MIC*    AMPICILLIN <=2 SENSITIVE Sensitive     LEVOFLOXACIN 1 SENSITIVE Sensitive     NITROFURANTOIN <=16 SENSITIVE Sensitive     VANCOMYCIN 1 SENSITIVE Sensitive     * >=100,000 COLONIES/mL ENTEROCOCCUS FAECALIS  Urine culture  Status: None   Collection Time: 08/14/19 11:04 AM   Specimen: Urine, Clean Catch  Result Value Ref Range Status   Specimen Description   Final    URINE, CLEAN CATCH Performed at [facility], [address] [place], [place] [address]    Special Requests   Final    NONE Performed at [facility], [address] [place], [place] [address]    Culture   Final    NO GROWTH Performed at [facility], [address] [place], [place] [address]    Report Status 08/16/2019 FINAL  Final  SARS CORONAVIRUS 2 (TAT 6-24 HRS) Nasopharyngeal Nasopharyngeal Swab     Status: None   Collection Time: 08/14/19 11:10 AM   Specimen: Nasopharyngeal Swab  Result Value Ref Range Status   SARS Coronavirus 2 NEGATIVE NEGATIVE Final    Comment: (NOTE) SARS-CoV-2 target nucleic acids are NOT DETECTED. The SARS-CoV-2 RNA is generally detectable in upper and lower respiratory specimens during the acute phase of infection. Negative results do not preclude SARS-CoV-2 infection, do not rule out co-infections with other pathogens, and should not be used as the sole basis for treatment or other patient management decisions. Negative results must be combined with clinical observations, patient history, and epidemiological information. The expected result is Negative. Fact Sheet for Patients: HairSlick.nohttps://www.fda.gov/media/138098/download Fact Sheet for Healthcare Providers: quierodirigir.comhttps://www.fda.gov/media/138095/download This test is  not yet approved or cleared by the [place] FDA and  has been authorized for detection and/or diagnosis of SARS-CoV-2 by FDA under an Emergency Use Authorization (EUA). This EUA will remain  in effect (meaning this test can be used) for the duration of the COVID-19 declaration under Section 56 4(b)(1) of the Act, 21 U.S.C. section 360bbb-3(b)(1), unless the authorization is terminated or revoked sooner. Performed at [facility], [address] [place], [place] [address]   MRSA PCR Screening     Status: None   Collection Time: 08/15/19 12:05 AM   Specimen: Nasal Mucosa; Nasopharyngeal  Result Value Ref Range Status   MRSA by PCR NEGATIVE NEGATIVE Final    Comment:        The GeneXpert MRSA Assay (FDA approved for NASAL specimens only), is one component of a comprehensive MRSA colonization surveillance program. It is not intended to diagnose MRSA infection nor to guide or monitor treatment for MRSA infections. Performed at [facility], [address] [place], [place] [address]          Radiology Studies: No results found.      Scheduled Meds: . apixaban  2.5 mg Oral BID  . levothyroxine  37.5 mcg Oral Q0600  . memantine  5 mg Oral BID  . polyethylene glycol  17 g Oral Daily  . senna  1 tablet Oral Daily  . thiamine injection  100 mg Intravenous Daily  . vitamin B-12  500 mcg Oral Daily   Continuous Infusions: . sodium chloride 75 mL/hr at 08/15/19 1224  . cefTRIAXone (ROCEPHIN)  IV 1 g (08/16/19 1027)     LOS: 2 days    Time spent: 30 minutes    Pratik Hoover Brunette Shah, DO [facility] Pager [phone]  If 7PM-7AM, please contact night-coverage [email or web address] Password [email or web address] 08/16/2019, 10:54 AM

## 2019-08-16 NOTE — Progress Notes (Signed)
Initial Nutrition Assessment   Patient discharged before note was completed  DOCUMENTATION CODES:      INTERVENTION:  RD will continue to follow for goals of care and diet progression if it becomes appropriate   NUTRITION DIAGNOSIS:     related to   as evidenced by  .   GOAL:     MONITOR:     REASON FOR ASSESSMENT:   Malnutrition Screening Tool    ASSESSMENT: Patient is an 85 yo male with advanced dementia, CKD-3, chronic encephalopathy. He presents from facility with AKI/UTI following recent hospitialization 11/19.   Patient initially NPO 11/21-11/23 (1055) due to somnolence. Advanced to Heart Healthy. Requires assistance with feeding. Breakfast tray observed- 100% of oatmeal and juice consumed.   Weight history is suprisingly stable around 52-54 kg the 1.5 yr per hospital records. Patient has little fat reserves- no edema.   Labs: BMP Latest Ref Rng & Units 08/16/2019 08/15/2019 08/14/2019  Glucose 70 - 99 mg/dL 107(H) 83 92  BUN 8 - 23 mg/dL 12 16 20   Creatinine 0.61 - 1.24 mg/dL 1.32(H) 1.37(H) 2.00(H)  Sodium 135 - 145 mmol/L 144 148(H) 145  Potassium 3.5 - 5.1 mmol/L 3.6 4.1 4.0  Chloride 98 - 111 mmol/L 111 116(H) 108  CO2 22 - 32 mmol/L 26 25 27   Calcium 8.9 - 10.3 mg/dL 8.7(L) 8.9 9.2     NUTRITION - FOCUSED PHYSICAL EXAM: partial exam completed.   Diet Order:   Diet Order            Diet NPO time specified Except for: Sips with Meds  Diet effective now             EDUCATION NEEDS:    Skin:     Last BM:     Height:   Ht Readings from Last 1 Encounters:  08/14/19 5\' 4"  (1.626 m)    Weight:   Wt Readings from Last 1 Encounters:  08/14/19 51.7 kg    Ideal Body Weight:     BMI:  Body mass index is 19.56 kg/m.  Estimated Nutritional Needs:   Kcal:     Protein:     Fluid:     Robert Cater MS,RD,CSG,LDN Office: [phone] Pager: [phone]

## 2019-08-16 NOTE — Care Management Important Message (Signed)
Important Message  Patient Details  Name: Robert Hurst MRN: [id number] Date of Birth: November 21, 1931   Medicare Important Message Given:  Yes     Tommy Medal 08/16/2019, 2:33 PM

## 2019-08-17 DIAGNOSIS — G301 Alzheimer's disease with late onset: Secondary | ICD-10-CM | POA: Diagnosis not present

## 2019-08-17 DIAGNOSIS — F0280 Dementia in other diseases classified elsewhere without behavioral disturbance: Secondary | ICD-10-CM | POA: Diagnosis not present

## 2019-08-17 DIAGNOSIS — G9340 Encephalopathy, unspecified: Secondary | ICD-10-CM | POA: Diagnosis not present

## 2019-08-17 DIAGNOSIS — G308 Other Alzheimer's disease: Secondary | ICD-10-CM | POA: Diagnosis not present

## 2019-08-17 DIAGNOSIS — N3000 Acute cystitis without hematuria: Secondary | ICD-10-CM

## 2019-08-17 DIAGNOSIS — N1832 Chronic kidney disease, stage 3b: Secondary | ICD-10-CM | POA: Diagnosis present

## 2019-08-17 DIAGNOSIS — R262 Difficulty in walking, not elsewhere classified: Secondary | ICD-10-CM | POA: Diagnosis present

## 2019-08-17 DIAGNOSIS — M6281 Muscle weakness (generalized): Secondary | ICD-10-CM | POA: Diagnosis present

## 2019-08-17 DIAGNOSIS — N1831 Chronic kidney disease, stage 3a: Secondary | ICD-10-CM | POA: Diagnosis not present

## 2019-08-17 DIAGNOSIS — R55 Syncope and collapse: Secondary | ICD-10-CM | POA: Diagnosis present

## 2019-08-17 DIAGNOSIS — Z48815 Encounter for surgical aftercare following surgery on the digestive system: Secondary | ICD-10-CM | POA: Diagnosis present

## 2019-08-17 DIAGNOSIS — N200 Calculus of kidney: Secondary | ICD-10-CM | POA: Diagnosis present

## 2019-08-17 DIAGNOSIS — R1310 Dysphagia, unspecified: Secondary | ICD-10-CM | POA: Diagnosis present

## 2019-08-17 DIAGNOSIS — Z515 Encounter for palliative care: Secondary | ICD-10-CM | POA: Diagnosis not present

## 2019-08-17 DIAGNOSIS — G309 Alzheimer's disease, unspecified: Secondary | ICD-10-CM | POA: Diagnosis not present

## 2019-08-17 DIAGNOSIS — Z7189 Other specified counseling: Secondary | ICD-10-CM | POA: Diagnosis not present

## 2019-08-17 DIAGNOSIS — R41841 Cognitive communication deficit: Secondary | ICD-10-CM | POA: Diagnosis present

## 2019-08-17 DIAGNOSIS — G9341 Metabolic encephalopathy: Secondary | ICD-10-CM | POA: Diagnosis present

## 2019-08-17 DIAGNOSIS — N390 Urinary tract infection, site not specified: Secondary | ICD-10-CM | POA: Diagnosis not present

## 2019-08-17 DIAGNOSIS — E440 Moderate protein-calorie malnutrition: Secondary | ICD-10-CM | POA: Diagnosis present

## 2019-08-17 DIAGNOSIS — R498 Other voice and resonance disorders: Secondary | ICD-10-CM | POA: Diagnosis present

## 2019-08-17 DIAGNOSIS — K8043 Calculus of bile duct with acute cholecystitis with obstruction: Secondary | ICD-10-CM | POA: Diagnosis present

## 2019-08-17 DIAGNOSIS — R2681 Unsteadiness on feet: Secondary | ICD-10-CM | POA: Diagnosis present

## 2019-08-17 DIAGNOSIS — R1312 Dysphagia, oropharyngeal phase: Secondary | ICD-10-CM | POA: Diagnosis present

## 2019-08-17 LAB — BASIC METABOLIC PANEL
Anion gap: 8 (ref 5–15)
BUN: 11 mg/dL (ref 8–23)
CO2: 23 mmol/L (ref 22–32)
Calcium: 8.4 mg/dL — ABNORMAL LOW (ref 8.9–10.3)
Chloride: 110 mmol/L (ref 98–111)
Creatinine, Ser: 1.17 mg/dL (ref 0.61–1.24)
GFR calc Af Amer: >60 mL/min (ref >60)
GFR calc non Af Amer: 57 mL/min — ABNORMAL LOW (ref >60)
Glucose, Bld: 91 mg/dL (ref 70–99)
Potassium: 3.3 mmol/L — ABNORMAL LOW (ref 3.5–5.1)
Sodium: 141 mmol/L (ref 135–145)

## 2019-08-17 MED ORDER — POTASSIUM CHLORIDE CRYS ER 20 MEQ PO TBCR
40.0000 meq | EXTENDED_RELEASE_TABLET | Freq: Once | ORAL | Status: AC
  Administered 2019-08-17: 40 meq via ORAL
  Filled 2019-08-17: qty 2

## 2019-08-17 MED ORDER — APIXABAN 2.5 MG PO TABS: 2.5000 mg | ORAL_TABLET | Freq: Two times a day (BID) | ORAL | 0 refills | Status: DC

## 2019-08-17 NOTE — Care Management (Signed)
Patient to DC back to [facility]. Discussed with Jackelyn Poling of [facility]. They will refer patient out to [facility] for evaluation once back at the facility.  Clinicals sent. EMS arranged. Bedside RN to call report.

## 2019-08-17 NOTE — Progress Notes (Signed)
Palliative:  HPI: 85 y.o. male  with past medical history of Alzheimer dementia, CKD stage 3, DVT on Xarelto, urinary incontinence, recent admission for AKI and UTI 08/08/19-08/12/19 admitted on 08/14/2019 with worsening mentation and recurrent UTI. Mental status/alertness has improved and he remains stable.    Robert Hurst is a pleasant gentleman with dementia. He is alert and oriented to person and place. He has delayed response but appears comfortable and content.   I spoke with daughter, Robert Hurst, for a long time. We spent time discussing concerns that her father remains at risk for recurrent infection d/t to cognitive decline as he is incontinent, does not remember to drink water unless reminded (although physically able to feed self and pick up cup to drink), and unable to call for assistance when he has urinated. Robert Hurst and family struggle with this as they were unaware of his progression since visitation has been so limited during [place]. Her sister has visited but difficulty with communicating via window or when masked and spaced away outside. I advised Robert Hurst to reach out to staff at SNF (in particular they have had good response and action from Education officer, museum) to express concerns to ensure that he is receiving the care he needs and to establish a line of communication with SNF to know his status and what is going on. We did discuss potential of hospice added to care and family is open to this but would like to try working with SNF and some rehab to see if he will do any better. Will add palliative care or hospice to follow to make connection when the time is right for hospice.   I received return call from Elbert who tells me that she spoke with the social worker and is much relieved as Education officer, museum has been very involved with her father's care and SNF is aware and confirms his functional status and cognitive deficits that we discussed. She has learned that her father has also connected with an aide at  SNF too. Robert Hurst feels much better having this information and line of communication and thanked me for my advise and discussions.   All questions/concerns addressed. Emotional support provided.   Plan: - Return to SNF.  - Encouraged family to speak and plan with SNF to ensure he has proper hygiene, frequent bathing, have water/drink placed directly in front of him at all times and prompted to drink when anyone is in the room. I also encouraged them to develop a good contact with staff at SNF in order to get up to date information on her father's status. She has made this connection with social worker today.  - [facility] to connect and follow for hospice needs.   [id number] 70 min  Vinie Sill, NP Palliative Medicine Team Pager [phone] (Please see [email or web address] for schedule) Team Phone [phone]    Greater than 50%  of this time was spent counseling and coordinating care related to the above assessment and plan

## 2019-08-17 NOTE — [place] FL2 (Signed)
[place] MEDICAID FL2 LEVEL OF CARE SCREENING TOOL     IDENTIFICATION  Patient Name: Robert Hurst Birthdate: 25-Jan-1932 Sex: male Admission Date (Current Location): 08/14/2019  [facility] and [place] Number:  [employer] and Address:  [facility],  [address] [place] [address]      Provider Number: [id number]  Attending Physician Name and Address:  Erick Blinks, DO  Relative Name and Phone Number:       Current Level of Care: Hospital Recommended Level of Care: Skilled Nursing Facility Prior Approval Number:    Date Approved/Denied:   PASRR Number:    Discharge Plan: SNF    Current Diagnoses: Patient Active Problem List   Diagnosis Date Noted  . Acute cystitis without hematuria   . Goals of care, counseling/discussion   . Palliative care encounter   . Acute encephalopathy 08/14/2019  . Sepsis due to Enterococcus (HCC) 08/11/2019  . Acute metabolic encephalopathy 08/11/2019  . Syncope and collapse 08/11/2019  . Syncope   . Sepsis secondary to UTI (HCC) 08/08/2019  . Dementia without behavioral disturbance (HCC) 08/08/2019  . Acute renal failure superimposed on stage 3b chronic kidney disease (HCC) 08/08/2019  . Lactic acidosis 08/08/2019  . Incontinence 08/08/2019    Orientation RESPIRATION BLADDER Height & Weight        Normal Incontinent Weight: 51.7 kg Height:  5\' 4"  (162.6 cm)(pt stated)  BEHAVIORAL SYMPTOMS/MOOD NEUROLOGICAL BOWEL NUTRITION STATUS      Incontinent Diet(see DC summary)  AMBULATORY STATUS COMMUNICATION OF NEEDS Skin   Extensive Assist Verbally Normal                       Personal Care Assistance Level of Assistance  Bathing, Feeding, Dressing Bathing Assistance: Maximum assistance Feeding assistance: Limited assistance Dressing Assistance: Maximum assistance     Functional Limitations Info             SPECIAL CARE FACTORS FREQUENCY                       Contractures Contractures  Info: Not present    Additional Factors Info  Code Status, Allergies, Psychotropic Code Status Info: DNR Allergies Info: Penicllins Psychotropic Info: Zyprexa         Current Medications (08/17/2019):  This is the current hospital active medication list Current Facility-Administered Medications  Medication Dose Route Frequency Provider Last Rate Last Dose  . acetaminophen (TYLENOL) tablet 650 mg  650 mg Oral Q6H PRN 08/19/2019, Pratik D, DO       Or  . acetaminophen (TYLENOL) suppository 650 mg  650 mg Rectal Q6H PRN Sherryll Burger, Pratik D, DO      . apixaban (ELIQUIS) tablet 2.5 mg  2.5 mg Oral BID Sherryll Burger, Pratik D, DO   2.5 mg at 08/17/19 0936  . cefTRIAXone (ROCEPHIN) 1 g in sodium chloride 0.9 % 100 mL IVPB  1 g Intravenous Q24H 08/19/19, Pratik D, DO 200 mL/hr at 08/17/19 0942 1 g at 08/17/19 0942  . levothyroxine (SYNTHROID) tablet 37.5 mcg  37.5 mcg Oral Q0600 08/19/19 D, DO   37.5 mcg at 08/17/19 08/19/19  . memantine (NAMENDA) tablet 5 mg  5 mg Oral BID 0623, Pratik D, DO   5 mg at 08/17/19 0935  . ondansetron (ZOFRAN) tablet 4 mg  4 mg Oral Q6H PRN 08/19/19, Pratik D, DO       Or  . ondansetron (ZOFRAN) injection 4 mg  4  mg Intravenous Q6H PRN Manuella Ghazi, Pratik D, DO      . polyethylene glycol (MIRALAX / GLYCOLAX) packet 17 g  17 g Oral Daily Manuella Ghazi, Pratik D, DO   17 g at 08/17/19 0936  . senna (SENOKOT) tablet 8.6 mg  1 tablet Oral Daily Manuella Ghazi, Pratik D, DO   8.6 mg at 08/17/19 0936  . thiamine (B-1) injection 100 mg  100 mg Intravenous Daily Manuella Ghazi, Pratik D, DO   100 mg at 08/17/19 0936  . vitamin B-12 (CYANOCOBALAMIN) tablet 500 mcg  500 mcg Oral Daily Heath Lark D, DO   500 mcg at 08/17/19 6861     Discharge Medications: Please see discharge summary for a list of discharge medications.  Relevant Imaging Results:  Relevant Lab Results:   Additional Information    Bich Mchaney, Chauncey Reading, RN

## 2019-08-17 NOTE — Evaluation (Signed)
Physical Therapy Evaluation Patient Details Name: Ravon Mortellaro MRN: [id number] DOB: [birth year] Today's Date: 08/17/2019   History of Present Illness  Elvis Rondinelli is a 85 y.o. male with medical history significant for Alzheimer dementia with behavioral disturbances, CKD stage IIIb, prior history of DVT on Xarelto, urinary incontinence, and recent discharge after treatment for AKI and Enterococcus faecalis UTI on 11/19 who presented back from his facility with worsening mentation.  The symptoms have begun in the last day or so and he has remained minimally responsive.  It is unclear whether or not he has been eating or drinking much or even taking his antibiotics.  No other symptoms otherwise noted.    Clinical Impression  Patient unsteady on feet requiring use of RW to ambulate, limited to walking in room due to poor standing balance and fatigue.  Patient tolerated sitting up in chair after therapy with chair alarm in place and on - nursing staff notified.  Plan:  Patient to be discharged back to SNF today and discharged from physical therapy to care of nursing for ambulation daily as tolerated for length of stay.     Follow Up Recommendations SNF    Equipment Recommendations  None recommended by PT    Recommendations for Other Services       Precautions / Restrictions Precautions Precautions: Fall Restrictions Weight Bearing Restrictions: No      Mobility  Bed Mobility Overal bed mobility: Needs Assistance Bed Mobility: Supine to Sit     Supine to sit: Min assist     General bed mobility comments: increased time, labored movement  Transfers Overall transfer level: Needs assistance Equipment used: Rolling walker (2 wheeled) Transfers: Sit to/from [facility] Sit to Stand: Min assist Stand pivot transfers: Min assist       General transfer comment: slow labored movement  Ambulation/Gait Ambulation/Gait assistance: Min assist;Mod assist Gait Distance  (Feet): 22 Feet Assistive device: Rolling walker (2 wheeled) Gait Pattern/deviations: Decreased step length - right;Decreased step length - left;Decreased stride length Gait velocity: decreased   General Gait Details: unsteady labored gait with frequent episodes of stepping to close to RW requiring assistance to prevent loss of balance, occasionally takes long exaggerated steps with near loss of balance  Stairs            Wheelchair Mobility    Modified Rankin (Stroke Patients Only)       Balance Overall balance assessment: Needs assistance Sitting-balance support: Feet supported;No upper extremity supported Sitting balance-Leahy Scale: Fair Sitting balance - Comments: seated at EOB   Standing balance support: During functional activity;Bilateral upper extremity supported Standing balance-Leahy Scale: Fair Standing balance comment: fair/poor using RW                             Pertinent Vitals/Pain Pain Assessment: No/denies pain    Home Living Family/patient expects to be discharged to:: Skilled nursing facility                      Prior Function Level of Independence: Needs assistance   Gait / Transfers Assistance Needed: assisted household ambulator using RW  ADL's / Homemaking Assistance Needed: assisted by SNF staff        Hand Dominance        Extremity/Trunk Assessment   Upper Extremity Assessment Upper Extremity Assessment: Generalized weakness    Lower Extremity Assessment Lower Extremity Assessment: Generalized weakness    Cervical / Trunk  Assessment Cervical / Trunk Assessment: Normal  Communication      Cognition Arousal/Alertness: Awake/alert Behavior During Therapy: WFL for tasks assessed/performed Overall Cognitive Status: History of cognitive impairments - at baseline                                        General Comments      Exercises     Assessment/Plan    PT Assessment All  further PT needs can be met in the next venue of care  PT Problem List Decreased strength;Decreased activity tolerance;Decreased balance;Decreased mobility       PT Treatment Interventions      PT Goals (Current goals can be found in the Care Plan section)  Acute Rehab PT Goals Patient Stated Goal: return home PT Goal Formulation: With patient Time For Goal Achievement: 08/17/19 Potential to Achieve Goals: Good    Frequency     Barriers to discharge        Co-evaluation               AM-PAC PT "6 Clicks" Mobility  Outcome Measure Help needed turning from your back to your side while in a flat bed without using bedrails?: A Little Help needed moving from lying on your back to sitting on the side of a flat bed without using bedrails?: A Little Help needed moving to and from a bed to a chair (including a wheelchair)?: A Lot Help needed standing up from a chair using your arms (e.g., wheelchair or bedside chair)?: A Little Help needed to walk in hospital room?: A Lot Help needed climbing 3-5 steps with a railing? : A Lot 6 Click Score: 15    End of Session   Activity Tolerance: Patient tolerated treatment well;Patient limited by fatigue Patient left: in chair;with call bell/phone within reach;with chair alarm set Nurse Communication: Mobility status PT Visit Diagnosis: Unsteadiness on feet (R26.81);Other abnormalities of gait and mobility (R26.89);Muscle weakness (generalized) (M62.81)    Time: 2947-6546 PT Time Calculation (min) (ACUTE ONLY): 29 min   Charges:   PT Evaluation $PT Eval Moderate Complexity: 1 Mod          2:31 PM, 08/17/19 Lonell Grandchild, MPT Physical Therapist with [facility] [phone] office [phone] mobile phone

## 2019-08-17 NOTE — Discharge Summary (Signed)
Physician Discharge Summary  [name] WUJ:[id number] DOB: 05-20-1932 DOA: 08/14/2019  PCP: [name], MD  Admit date: 08/14/2019  Discharge date: 08/17/2019  Admitted From:SNF  Disposition:  SNF  Recommendations for Outpatient Follow-up:  1. Follow up with PCP in 1-2 weeks 2. Continue on medications as prescribed 3. Patient will need to follow-up with palliative care outpatient to consider hospice  Home Health: None  Equipment/Devices: None  Discharge Condition: Stable  CODE STATUS: DNR  Diet recommendation: Heart Healthy  Brief/Interim Summary: Per HPI: Robert Perkinsis a 85 y.o.malewith medical history significant forAlzheimer dementia with behavioral disturbances, CKD stage IIIb, prior history of DVT on Xarelto, urinary incontinence, and recent discharge after treatment for AKI and Enterococcus faecalis UTI on 11/19 who presented back from his facility with worsening mentation. The symptoms have begun in the last day or so and he has remained minimally responsive. It is unclear whether or not he has been eating or drinking much or even taking his antibiotics. No other symptoms otherwise noted.  11/22:Patient was admitted with acute on chronic encephalopathy suspected secondary to metabolic causes of UTI and AKI. He is noted to have Enterococcus faecalis UTI and has been started on Rocephin. AKI appears to be improving with creatinine 1.37 today on IV fluid. He no longer has bradycardia either. He still remains quite somnolent.  11/23: Patient appears to be more alert and awake today.  His repeat urine cultures are still pending.  Creatinine stable at 1.32.  Okay to start diet today.  Palliative care consultation pending.  11/24: Patient has creatinine of 1.17 and is more awake and alert and conversational this morning.  He still appears somewhat confused but is tolerating his diet and appears to be at baseline.  Palliative care has evaluated patient on 11/23  and is discussed with family members who would like to continue forward with rehabilitation and consider palliative care evaluation in the rehab setting in the near future.  Patient does appear to be near end-of-life and may benefit from hospice care soon, especially if he fails any further rehabilitation measures.  Patient finished his course of antibiotic treatment for Enterococcus faecalis UTI and has no further need for antibiotics.  Discharge Diagnoses:  Active Problems:   Acute encephalopathy   Acute cystitis without hematuria   Goals of care, counseling/discussion   Palliative care encounter  Principal discharge diagnosis: Acute on chronic metabolic encephalopathy secondary to UTI/AKI.  Discharge Instructions  Discharge Instructions    Diet - low sodium heart healthy   Complete by: As directed    Increase activity slowly   Complete by: As directed      Allergies as of 08/17/2019      Reactions   Penicillins    Has patient had a PCN reaction causing immediate rash, facial/tongue/throat swelling, SOB or lightheadedness with hypotension: Unknown Has patient had a PCN reaction causing severe rash involving mucus membranes or skin necrosis: Unknown Has patient had a PCN reaction that required hospitalization: Unknown Has patient had a PCN reaction occurring within the last 10 years: Unknown If all of the above answers are "NO", then may proceed with Cephalosporin use.      Medication List    STOP taking these medications   nitrofurantoin (macrocrystal-monohydrate) 100 MG capsule Commonly known as: MACROBID   rivaroxaban 20 MG Tabs tablet Commonly known as: XARELTO   vitamin B-12 500 MCG tablet Commonly known as: CYANOCOBALAMIN     TAKE these medications   apixaban 2.5 MG Tabs  tablet Commonly known as: ELIQUIS Take 1 tablet (2.5 mg total) by mouth 2 (two) times daily.   levothyroxine 75 MCG tablet Commonly known as: SYNTHROID Take 0.5 tablets (37.5 mcg total) by  mouth daily at 6 (six) AM.   memantine 10 MG tablet Commonly known as: NAMENDA Take 10 mg by mouth 2 (two) times daily.   OLANZapine 2.5 MG tablet Commonly known as: ZYPREXA Take 2.5 mg by mouth at bedtime.   polyethylene glycol 17 g packet Commonly known as: MIRALAX / GLYCOLAX Take 17 g by mouth daily.   senna 8.6 MG tablet Commonly known as: SENOKOT Take 1 tablet by mouth daily.      Follow-up Information    [name], MD Follow up in 1 week(s).   Specialty: Internal Medicine Contact information: [address] [place] [address] [phone]          Allergies  Allergen Reactions  . Penicillins     Has patient had a PCN reaction causing immediate rash, facial/tongue/throat swelling, SOB or lightheadedness with hypotension: Unknown Has patient had a PCN reaction causing severe rash involving mucus membranes or skin necrosis: Unknown Has patient had a PCN reaction that required hospitalization: Unknown Has patient had a PCN reaction occurring within the last 10 years: Unknown If all of the above answers are "NO", then may proceed with Cephalosporin use.     Consultations:  Palliative care   Procedures/Studies: Ct Head Wo Contrast  Result Date: 08/14/2019 CLINICAL DATA:  Altered level of consciousness EXAM: CT HEAD WITHOUT CONTRAST TECHNIQUE: Contiguous axial images were obtained from the base of the skull through the vertex without intravenous contrast. COMPARISON:  Head CT dated 08/08/2019 FINDINGS: Brain: No evidence of acute infarction, hemorrhage, hydrocephalus, extra-axial collection or mass lesion/mass effect. There is moderate cerebral volume loss with associated ex vacuo dilatation. Periventricular white matter hypoattenuation likely represents chronic small vessel ischemic disease. Vascular: There are vascular calcifications in the carotid siphons. Skull: Normal. Negative for fracture or focal lesion. Sinuses/Orbits: No acute finding. Other:  None. IMPRESSION: No acute intracranial process. Electronically Signed   By: Romona Curls M.D.   On: 08/14/2019 10:22   Ct Head Wo Contrast  Result Date: 08/08/2019 CLINICAL DATA:  Altered level of consciousness, unexplained. EXAM: CT HEAD WITHOUT CONTRAST TECHNIQUE: Contiguous axial images were obtained from the base of the skull through the vertex without intravenous contrast. COMPARISON:  07/30/2013 FINDINGS: Brain: No evidence of acute infarction, hemorrhage, hydrocephalus, extra-axial collection or mass lesion/mass effect. There is mild diffuse low-attenuation within the subcortical and periventricular white matter compatible with chronic microvascular disease. Prominence of the CSF spaces compatible with brain atrophy. Vascular: No hyperdense vessel or unexpected calcification. Skull: Normal. Negative for fracture or focal lesion. Sinuses/Orbits: Paranasal sinuses and mastoid air cells are clear. Other: None IMPRESSION: 1. No acute intracranial abnormalities. 2. Chronic small vessel ischemic change and brain atrophy. Electronically Signed   By: Signa Kell M.D.   On: 08/08/2019 07:05   Dg Chest Portable 1 View  Result Date: 08/14/2019 CLINICAL DATA:  Altered mental status EXAM: PORTABLE CHEST 1 VIEW COMPARISON:  Chest radiograph dated 08/08/2019 FINDINGS: The heart is enlarged accounting for technique. There is mild left basilar atelectasis/airspace disease. The left costophrenic angle is obscured and a small left pleural effusion cannot be excluded. There is no right pleural effusion or pneumothorax. The osseous structures are intact. IMPRESSION: 1. Mild left basilar atelectasis/airspace disease. A small left pleural effusion cannot be excluded. 2. Cardiomegaly.  Electronically Signed   By: Zerita Boers M.D.   On: 08/14/2019 10:18   Dg Chest Port 1 View  Result Date: 08/08/2019 CLINICAL DATA:  Syncope and bradycardia. EXAM: PORTABLE CHEST 1 VIEW COMPARISON:  01/19/2018 FINDINGS: Moderate  cardiac enlargement. There is asymmetric elevation of right hemidiaphragm. No pleural effusion or edema identified. No airspace opacities. IMPRESSION: 1. No acute cardiopulmonary abnormalities. 2. Asymmetric elevation of right hemidiaphragm. 3. Cardiac enlargement. Electronically Signed   By: Kerby Moors M.D.   On: 08/08/2019 06:59     Discharge Exam: Vitals:   08/16/19 2108 08/17/19 0605  BP: 131/83 (!) 162/83  Pulse: (!) 54 98  Resp: 16 14  Temp: 98.7 F (37.1 C) 98.1 F (36.7 C)  SpO2: 94% 98%   Vitals:   08/16/19 0527 08/16/19 1320 08/16/19 2108 08/17/19 0605  BP:  125/77 131/83 (!) 162/83  Pulse: 96 (!) 45 (!) 54 98  Resp:  18 16 14   Temp:  98.3 F (36.8 C) 98.7 F (37.1 C) 98.1 F (36.7 C)  TempSrc:  Oral Oral Oral  SpO2: 97% 100% 94% 98%  Weight:      Height:        General: Pt is alert, awake, not in acute distress Cardiovascular: RRR, S1/S2 +, no rubs, no gallops Respiratory: CTA bilaterally, no wheezing, no rhonchi Abdominal: Soft, NT, ND, bowel sounds + Extremities: no edema, no cyanosis    The results of significant diagnostics from this hospitalization (including imaging, microbiology, ancillary and laboratory) are listed below for reference.     Microbiology: Recent Results (from the past 240 hour(s))  SARS CORONAVIRUS 2 (TAT 6-24 HRS) Nasopharyngeal Nasopharyngeal Swab     Status: None   Collection Time: 08/08/19  6:22 AM   Specimen: Nasopharyngeal Swab  Result Value Ref Range Status   SARS Coronavirus 2 NEGATIVE NEGATIVE Final    Comment: (NOTE) SARS-CoV-2 target nucleic acids are NOT DETECTED. The SARS-CoV-2 RNA is generally detectable in upper and lower respiratory specimens during the acute phase of infection. Negative results do not preclude SARS-CoV-2 infection, do not rule out co-infections with other pathogens, and should not be used as the sole basis for treatment or other patient management decisions. Negative results must be combined  with clinical observations, patient history, and epidemiological information. The expected result is Negative. Fact Sheet for Patients: SugarRoll.be Fact Sheet for Healthcare Providers: https://www.woods-mathews.com/ This test is not yet approved or cleared by the [place] FDA and  has been authorized for detection and/or diagnosis of SARS-CoV-2 by FDA under an Emergency Use Authorization (EUA). This EUA will remain  in effect (meaning this test can be used) for the duration of the COVID-19 declaration under Section 56 4(b)(1) of the Act, 21 U.S.C. section 360bbb-3(b)(1), unless the authorization is terminated or revoked sooner. Performed at [facility], [place] [address] [place], [place] [address]   Culture, blood (routine x 2)     Status: Abnormal   Collection Time: 08/08/19  8:58 AM   Specimen: BLOOD RIGHT HAND  Result Value Ref Range Status   Specimen Description   Final    BLOOD RIGHT HAND BOTTLES DRAWN AEROBIC AND ANAEROBIC Performed at [facility], [address] [place], [place] [address]    Special Requests   Final    Blood Culture adequate volume Performed at [facility], [address] [place], [place] [address]    Culture  Setup Time   Final    GRAM POSITIVE COCCI AEROBIC BOTTLE  ONLY Gram Stain Report Called to,Read Back By and Verified With: HEATHER E.,RN @0657  08/09/2019 KAY Performed at [facility], [address] [place], [place] [address]    Culture (A)  Final    STAPHYLOCOCCUS SPECIES (COAGULASE NEGATIVE) THE SIGNIFICANCE OF ISOLATING THIS ORGANISM FROM A SINGLE SET OF BLOOD CULTURES WHEN MULTIPLE SETS ARE DRAWN IS UNCERTAIN. PLEASE NOTIFY THE MICROBIOLOGY DEPARTMENT WITHIN ONE WEEK IF SPECIATION AND SENSITIVITIES ARE REQUIRED. Performed at [facility], [address] [place], [place] [address]    Report Status 08/10/2019 FINAL  Final  Culture, blood (routine x 2)     Status: None   Collection  Time: 08/08/19  9:01 AM   Specimen: Left Antecubital; Blood  Result Value Ref Range Status   Specimen Description   Final    LEFT ANTECUBITAL BOTTLES DRAWN AEROBIC AND ANAEROBIC   Special Requests Blood Culture adequate volume  Final   Culture   Final    NO GROWTH 5 DAYS Performed at [facility], [address] [place], [place] [address]    Report Status 08/13/2019 FINAL  Final  Urine culture     Status: Abnormal   Collection Time: 08/08/19  5:00 PM   Specimen: Urine, Catheterized  Result Value Ref Range Status   Specimen Description   Final    URINE, CATHETERIZED Performed at [facility], [address] [place], [place] [address]    Special Requests   Final    NONE Performed at [facility], [address] [place], [place] [address]    Culture >=100,000 COLONIES/mL ENTEROCOCCUS FAECALIS (A)  Final   Report Status 08/11/2019 FINAL  Final   Organism ID, Bacteria ENTEROCOCCUS FAECALIS (A)  Final      Susceptibility   Enterococcus faecalis - MIC*    AMPICILLIN <=2 SENSITIVE Sensitive     LEVOFLOXACIN 1 SENSITIVE Sensitive     NITROFURANTOIN <=16 SENSITIVE Sensitive     VANCOMYCIN 1 SENSITIVE Sensitive     * >=100,000 COLONIES/mL ENTEROCOCCUS FAECALIS  Urine culture     Status: None   Collection Time: 08/14/19 11:04 AM   Specimen: Urine, Clean Catch  Result Value Ref Range Status   Specimen Description   Final    URINE, CLEAN CATCH Performed at [facility], [address] [place], [place] [address]    Special Requests   Final    NONE Performed at [facility], [address] [place], [place] [address]    Culture   Final    NO GROWTH Performed at [facility], [address] [place], [place] [address]    Report Status 08/16/2019 FINAL  Final  SARS CORONAVIRUS 2 (TAT 6-24 HRS) Nasopharyngeal Nasopharyngeal Swab     Status: None   Collection Time: 08/14/19 11:10 AM   Specimen: Nasopharyngeal Swab  Result Value Ref Range Status   SARS Coronavirus 2 NEGATIVE  NEGATIVE Final    Comment: (NOTE) SARS-CoV-2 target nucleic acids are NOT DETECTED. The SARS-CoV-2 RNA is generally detectable in upper and lower respiratory specimens during the acute phase of infection. Negative results do not preclude SARS-CoV-2 infection, do not rule out co-infections with other pathogens, and should not be used as the sole basis for treatment or other patient management decisions. Negative results must be combined with clinical observations, patient history, and epidemiological information. The expected result is Negative. Fact Sheet for Patients: HairSlick.nohttps://www.fda.gov/media/138098/download Fact Sheet for Healthcare Providers: quierodirigir.comhttps://www.fda.gov/media/138095/download This test is not yet approved or cleared by the [place] FDA  and  has been authorized for detection and/or diagnosis of SARS-CoV-2 by FDA under an Emergency Use Authorization (EUA). This EUA will remain  in effect (meaning this test can be used) for the duration of the COVID-19 declaration under Section 56 4(b)(1) of the Act, 21 U.S.C. section 360bbb-3(b)(1), unless the authorization is terminated or revoked sooner. Performed at [facility], [address] [place], [place] [address]   MRSA PCR Screening     Status: None   Collection Time: 08/15/19 12:05 AM   Specimen: Nasal Mucosa; Nasopharyngeal  Result Value Ref Range Status   MRSA by PCR NEGATIVE NEGATIVE Final    Comment:        The GeneXpert MRSA Assay (FDA approved for NASAL specimens only), is one component of a comprehensive MRSA colonization surveillance program. It is not intended to diagnose MRSA infection nor to guide or monitor treatment for MRSA infections. Performed at [facility], [address] [place], [place] [address]      Labs: BNP (last 3 results) No results for input(s): BNP in the last 8760 hours. Basic Metabolic Panel: Recent Labs  Lab 08/12/19 0500 08/14/19 0937 08/15/19 0748 08/16/19 0624  08/17/19 0449  NA 143 145 148* 144 141  K 3.1* 4.0 4.1 3.6 3.3*  CL 111 108 116* 111 110  CO2 GLUCOSE 93 92 83 107* 91  BUN 5* CREATININE 1.19 2.00* 1.37* 1.32* 1.17  CALCIUM 8.6* 9.2 8.9 8.7* 8.4*  MG  --   --  1.7  --   --    Liver Function Tests: Recent Labs  Lab 08/14/19 0937  AST 41  ALT 17  ALKPHOS 68  BILITOT 1.1  PROT 6.9  ALBUMIN 3.3*   No results for input(s): LIPASE, AMYLASE in the last 168 hours. Recent Labs  Lab 08/14/19 0937  AMMONIA 14   CBC: Recent Labs  Lab 08/14/19 0937 08/15/19 0748  WBC 4.8 3.6*  NEUTROABS 2.4  --   HGB 13.9 12.7*  HCT 42.1 39.2  MCV 91.3 92.9  PLT 143* 121*   Cardiac Enzymes: No results for input(s): CKTOTAL, CKMB, CKMBINDEX, TROPONINI in the last 168 hours. BNP: Invalid input(s): POCBNP CBG: Recent Labs  Lab 08/10/19 1548  GLUCAP 118*   D-Dimer No results for input(s): DDIMER in the last 72 hours. Hgb A1c No results for input(s): HGBA1C in the last 72 hours. Lipid Profile No results for input(s): CHOL, HDL, LDLCALC, TRIG, CHOLHDL, LDLDIRECT in the last 72 hours. Thyroid function studies No results for input(s): TSH, T4TOTAL, T3FREE, THYROIDAB in the last 72 hours.  Invalid input(s): FREET3 Anemia work up No results for input(s): VITAMINB12, FOLATE, FERRITIN, TIBC, IRON, RETICCTPCT in the last 72 hours. Urinalysis    Component Value Date/Time   COLORURINE YELLOW 08/14/2019 0921   APPEARANCEUR HAZY (A) 08/14/2019 0921   LABSPEC 1.011 08/14/2019 0921   PHURINE 5.0 08/14/2019 0921   GLUCOSEU NEGATIVE 08/14/2019 0921   HGBUR LARGE (A) 08/14/2019 0921   BILIRUBINUR NEGATIVE 08/14/2019 0921   KETONESUR 5 (A) 08/14/2019 0921   PROTEINUR NEGATIVE 08/14/2019 0921   NITRITE NEGATIVE 08/14/2019 0921   LEUKOCYTESUR TRACE (A) 08/14/2019 0921   Sepsis Labs Invalid input(s): PROCALCITONIN,  WBC,  LACTICIDVEN Microbiology Recent Results (from the past 240 hour(s))  SARS CORONAVIRUS 2  (TAT 6-24 HRS) Nasopharyngeal Nasopharyngeal Swab     Status: None   Collection Time: 08/08/19  6:22 AM   Specimen:  Nasopharyngeal Swab  Result Value Ref Range Status   SARS Coronavirus 2 NEGATIVE NEGATIVE Final    Comment: (NOTE) SARS-CoV-2 target nucleic acids are NOT DETECTED. The SARS-CoV-2 RNA is generally detectable in upper and lower respiratory specimens during the acute phase of infection. Negative results do not preclude SARS-CoV-2 infection, do not rule out co-infections with other pathogens, and should not be used as the sole basis for treatment or other patient management decisions. Negative results must be combined with clinical observations, patient history, and epidemiological information. The expected result is Negative. Fact Sheet for Patients: HairSlick.no Fact Sheet for Healthcare Providers: quierodirigir.com This test is not yet approved or cleared by the [place] FDA and  has been authorized for detection and/or diagnosis of SARS-CoV-2 by FDA under an Emergency Use Authorization (EUA). This EUA will remain  in effect (meaning this test can be used) for the duration of the COVID-19 declaration under Section 56 4(b)(1) of the Act, 21 U.S.C. section 360bbb-3(b)(1), unless the authorization is terminated or revoked sooner. Performed at [facility], [address] [place], [place] [address]   Culture, blood (routine x 2)     Status: Abnormal   Collection Time: 08/08/19  8:58 AM   Specimen: BLOOD RIGHT HAND  Result Value Ref Range Status   Specimen Description   Final    BLOOD RIGHT HAND BOTTLES DRAWN AEROBIC AND ANAEROBIC Performed at [facility], [address], [place], [place] [address]    Special Requests   Final    Blood Culture adequate volume Performed at [facility], [address], [place], [place] [address]    Culture  Setup Time   Final    GRAM POSITIVE COCCI AEROBIC BOTTLE  ONLY Gram Stain Report Called to,Read Back By and Verified With: HEATHER E.,RN @0657  08/09/2019 KAY Performed at [facility], [address], [place], [place]    Culture (A)  Final    STAPHYLOCOCCUS SPECIES (COAGULASE NEGATIVE) THE SIGNIFICANCE OF ISOLATING THIS ORGANISM FROM A SINGLE SET OF BLOOD CULTURES WHEN MULTIPLE SETS ARE DRAWN IS UNCERTAIN. PLEASE NOTIFY THE MICROBIOLOGY DEPARTMENT WITHIN ONE WEEK IF SPECIATION AND SENSITIVITIES ARE REQUIRED. Performed at [facility], [address] [place], [place]    Report Status 08/10/2019 FINAL  Final  Culture, blood (routine x 2)     Status: None   Collection Time: 08/08/19  9:01 AM   Specimen: Left Antecubital; Blood  Result Value Ref Range Status   Specimen Description   Final    LEFT ANTECUBITAL BOTTLES DRAWN AEROBIC AND ANAEROBIC   Special Requests Blood Culture adequate volume  Final   Culture   Final    NO GROWTH 5 DAYS Performed at [facility], [address], [place], [place]    Report Status 08/13/2019 FINAL  Final  Urine culture     Status: Abnormal   Collection Time: 08/08/19  5:00 PM   Specimen: Urine, Catheterized  Result Value Ref Range Status   Specimen Description   Final    URINE, CATHETERIZED Performed at [facility], [address] [place], [place]    Special Requests   Final    NONE Performed at [facility], [address] [place], [place]    Culture >=100,000 COLONIES/mL ENTEROCOCCUS FAECALIS (A)  Final   Report Status 08/11/2019 FINAL  Final   Organism ID, Bacteria ENTEROCOCCUS FAECALIS (A)  Final      Susceptibility   Enterococcus faecalis -  MIC*    AMPICILLIN <=2 SENSITIVE Sensitive     LEVOFLOXACIN 1 SENSITIVE Sensitive     NITROFURANTOIN <=16 SENSITIVE Sensitive     VANCOMYCIN 1 SENSITIVE Sensitive     * >=100,000 COLONIES/mL ENTEROCOCCUS FAECALIS  Urine culture     Status: None   Collection Time: 08/14/19 11:04 AM   Specimen: Urine, Clean  Catch  Result Value Ref Range Status   Specimen Description   Final    URINE, CLEAN CATCH Performed at [facility], [address], [place], [place] [address]    Special Requests   Final    NONE Performed at [facility], [address], [place], [place] [address]    Culture   Final    NO GROWTH Performed at [facility], [address], [place], [place] [address]    Report Status 08/16/2019 FINAL  Final  SARS CORONAVIRUS 2 (TAT 6-24 HRS) Nasopharyngeal Nasopharyngeal Swab     Status: None   Collection Time: 08/14/19 11:10 AM   Specimen: Nasopharyngeal Swab  Result Value Ref Range Status   SARS Coronavirus 2 NEGATIVE NEGATIVE Final    Comment: (NOTE) SARS-CoV-2 target nucleic acids are NOT DETECTED. The SARS-CoV-2 RNA is generally detectable in upper and lower respiratory specimens during the acute phase of infection. Negative results do not preclude SARS-CoV-2 infection, do not rule out co-infections with other pathogens, and should not be used as the sole basis for treatment or other patient management decisions. Negative results must be combined with clinical observations, patient history, and epidemiological information. The expected result is Negative. Fact Sheet for Patients: HairSlick.no Fact Sheet for Healthcare Providers: quierodirigir.com This test is not yet approved or cleared by the [place] FDA and  has been authorized for detection and/or diagnosis of SARS-CoV-2 by FDA under an Emergency Use Authorization (EUA). This EUA will remain  in effect (meaning this test can be used) for the duration of the COVID-19 declaration under Section 56 4(b)(1) of the Act, 21 U.S.C. section 360bbb-3(b)(1), unless the authorization is terminated or revoked sooner. Performed at [facility], [address] [place], [place] [address]   MRSA PCR Screening     Status: None   Collection Time: 08/15/19 12:05 AM    Specimen: Nasal Mucosa; Nasopharyngeal  Result Value Ref Range Status   MRSA by PCR NEGATIVE NEGATIVE Final    Comment:        The GeneXpert MRSA Assay (FDA approved for NASAL specimens only), is one component of a comprehensive MRSA colonization surveillance program. It is not intended to diagnose MRSA infection nor to guide or monitor treatment for MRSA infections. Performed at [facility], [address] [place], [place] [address]      Time coordinating discharge: 35 minutes  SIGNED:   Erick Blinks, DO [facility] 08/17/2019, 1:22 PM  If 7PM-7AM, please contact night-coverage [email or web address]

## 2019-08-17 NOTE — Progress Notes (Signed)
Report given to [place] at [facility].

## 2019-08-23 DIAGNOSIS — N390 Urinary tract infection, site not specified: Secondary | ICD-10-CM | POA: Diagnosis not present

## 2019-08-23 DIAGNOSIS — G308 Other Alzheimer's disease: Secondary | ICD-10-CM | POA: Diagnosis not present

## 2019-08-23 DIAGNOSIS — N1831 Chronic kidney disease, stage 3a: Secondary | ICD-10-CM | POA: Diagnosis not present

## 2019-08-23 DIAGNOSIS — F0280 Dementia in other diseases classified elsewhere without behavioral disturbance: Secondary | ICD-10-CM | POA: Diagnosis not present

## 2019-08-24 DIAGNOSIS — G308 Other Alzheimer's disease: Secondary | ICD-10-CM | POA: Diagnosis not present

## 2019-08-24 DIAGNOSIS — Z86718 Personal history of other venous thrombosis and embolism: Secondary | ICD-10-CM | POA: Diagnosis not present

## 2019-08-24 DIAGNOSIS — N390 Urinary tract infection, site not specified: Secondary | ICD-10-CM | POA: Diagnosis not present

## 2020-01-26 IMAGING — RF DG ERCP WO/W SPHINCTEROTOMY
1 series · 15 of 22 positions shown · non-contrast
Comparison: None.

CLINICAL DATA: 85-year-old male with a history of
choledocholithiasis

EXAM:
ERCP
TECHNIQUE: Multiple spot images obtained with the fluoroscopic device and
submitted for interpretation post-procedure.
FLUOROSCOPY TIME:  4 minutes and 48 seconds

[Series 1: run · 8 acquisitions, 15 frames shown]
[im 1/8]
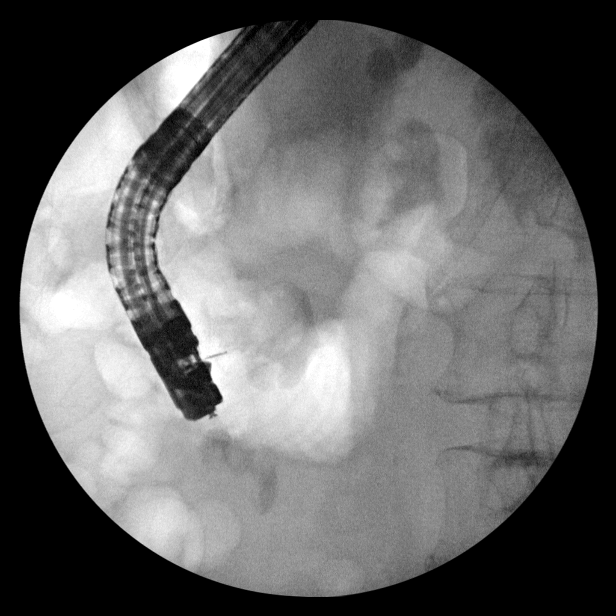
[im 2/8]
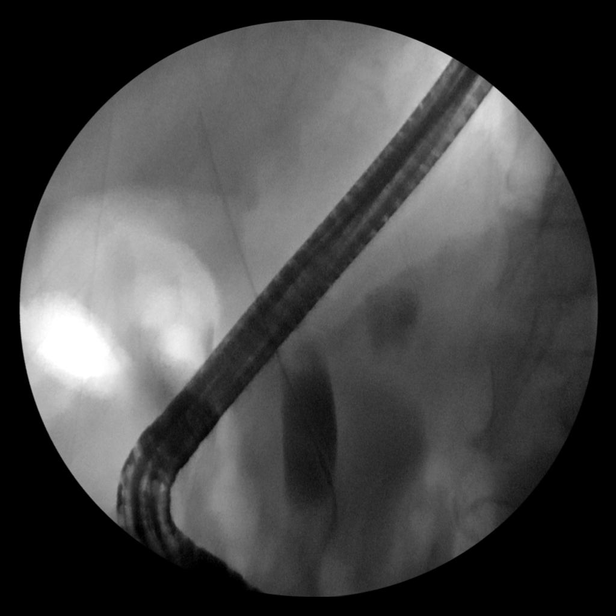
[im 2/8]
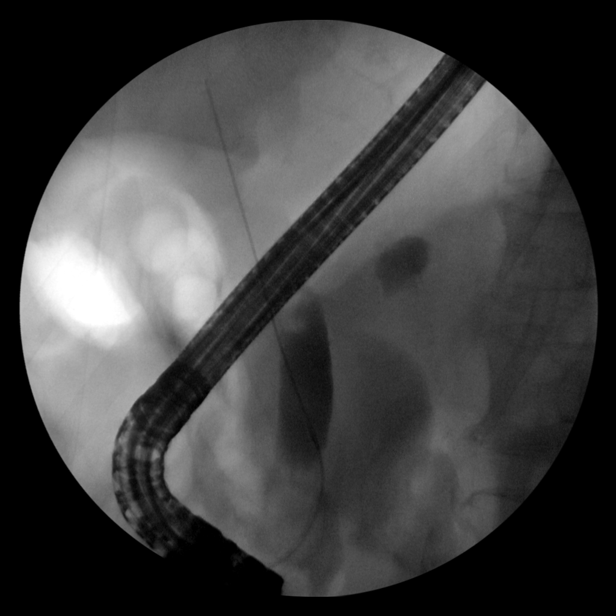
[im 3/8]
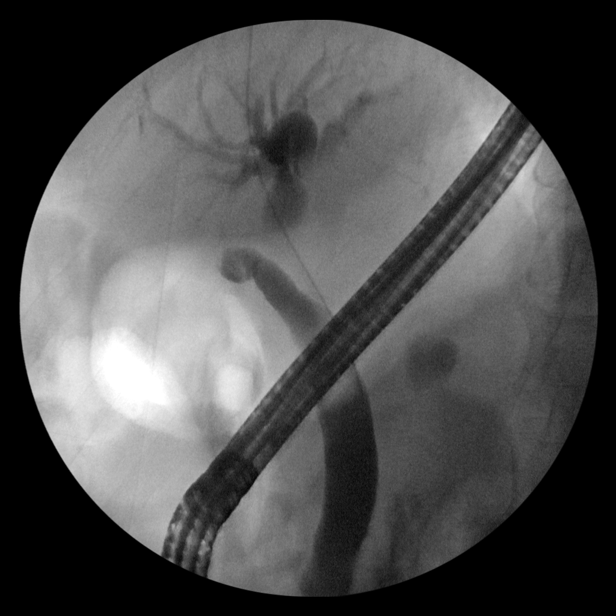
[im 4/8]
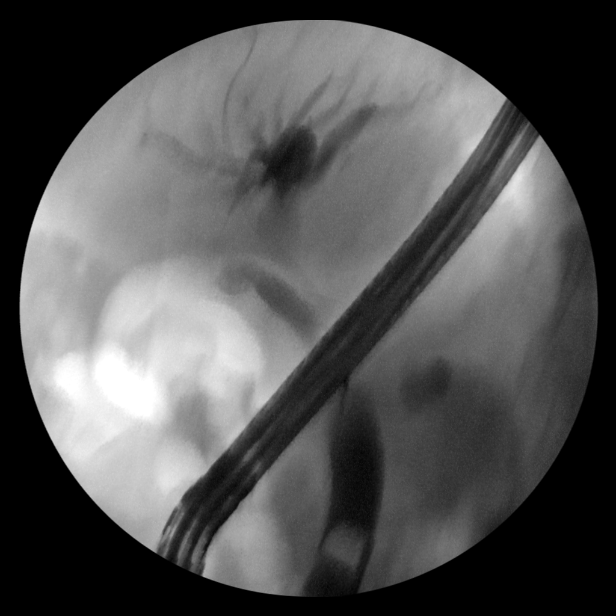
[im 4/8]
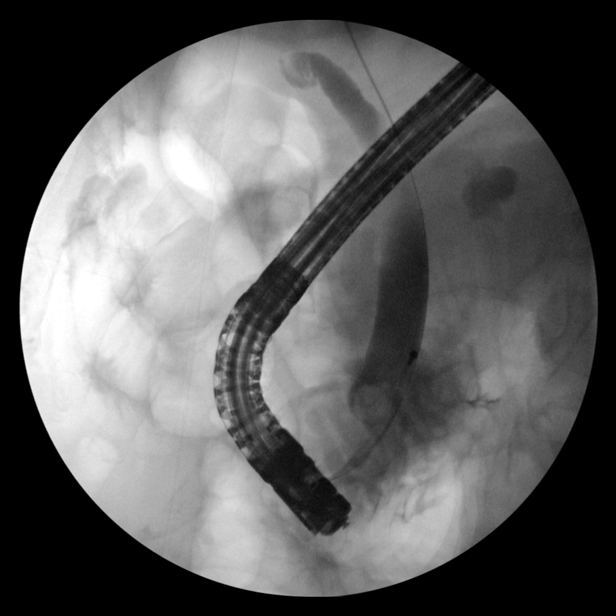
[im 4/8]
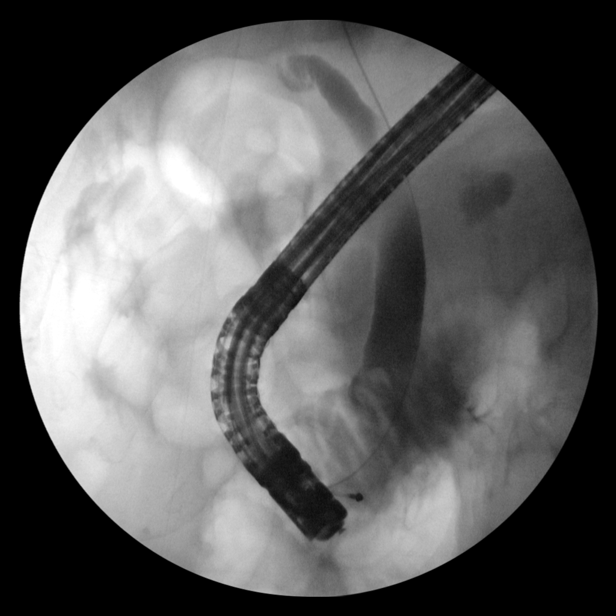
[im 5/8]
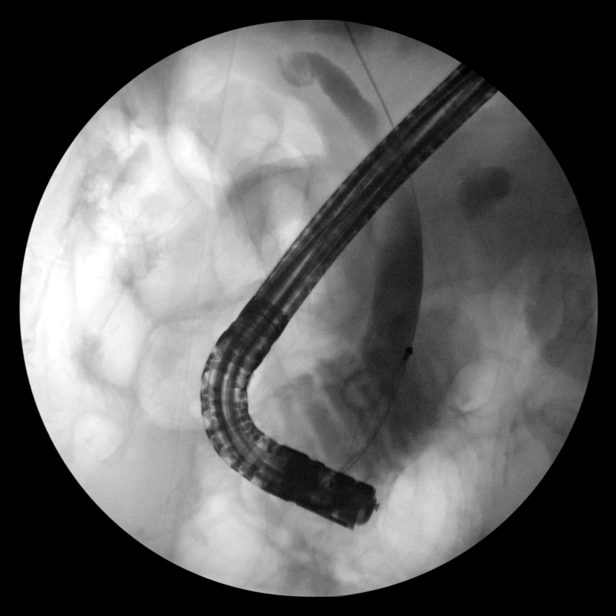
[im 5/8]
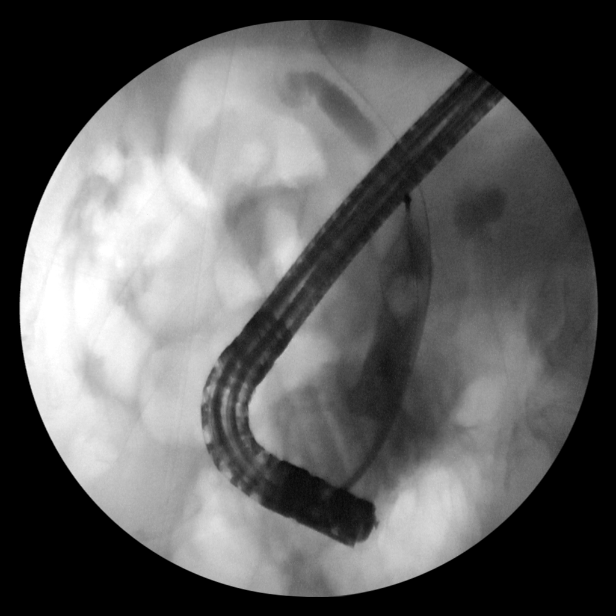
[im 6/8]
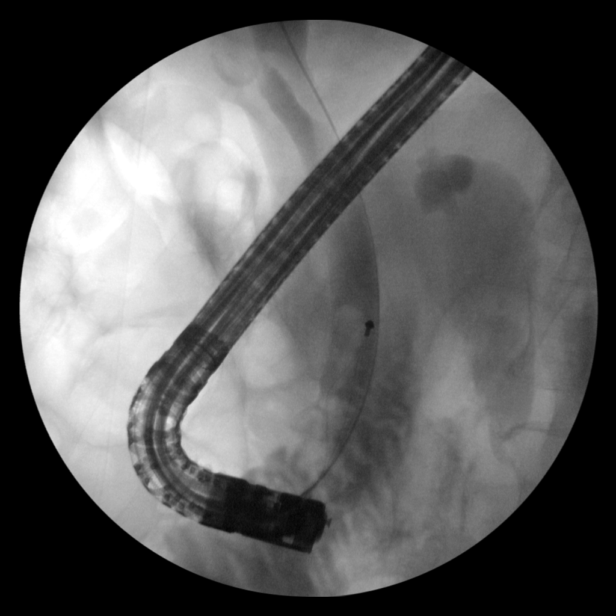
[im 6/8]
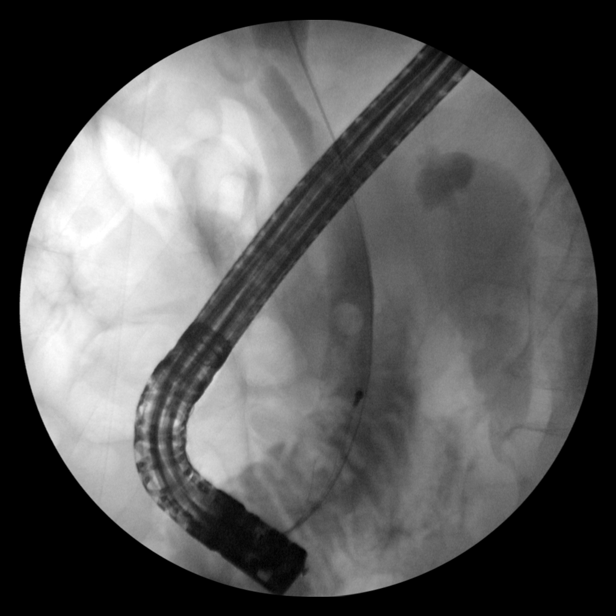
[im 6/8]
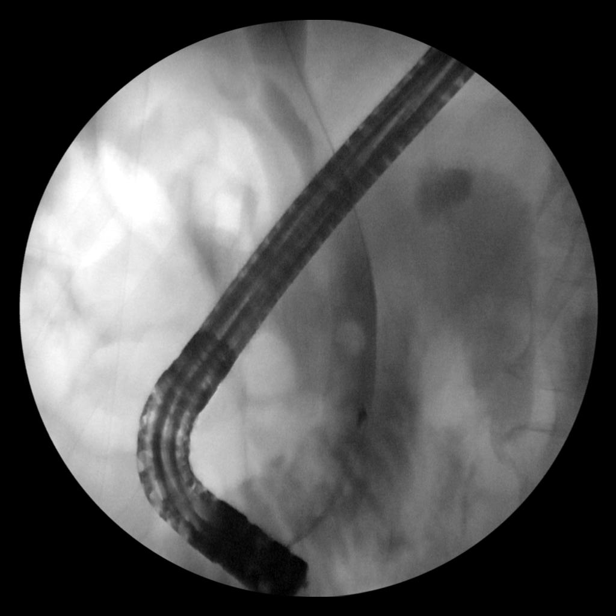
[im 7/8]
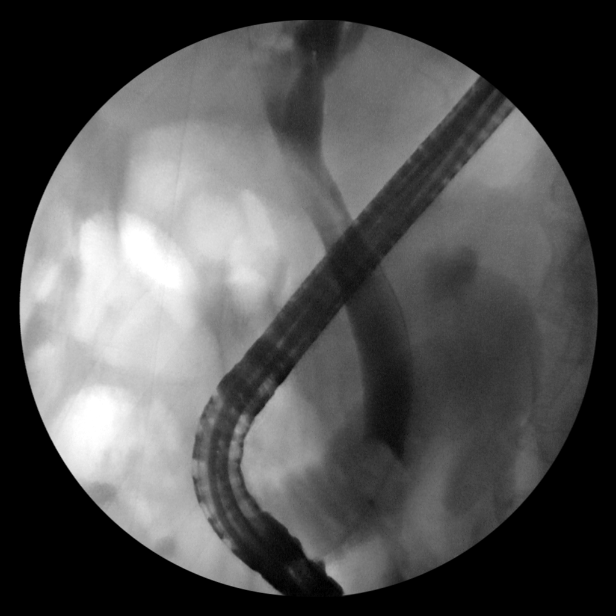
[im 7/8]
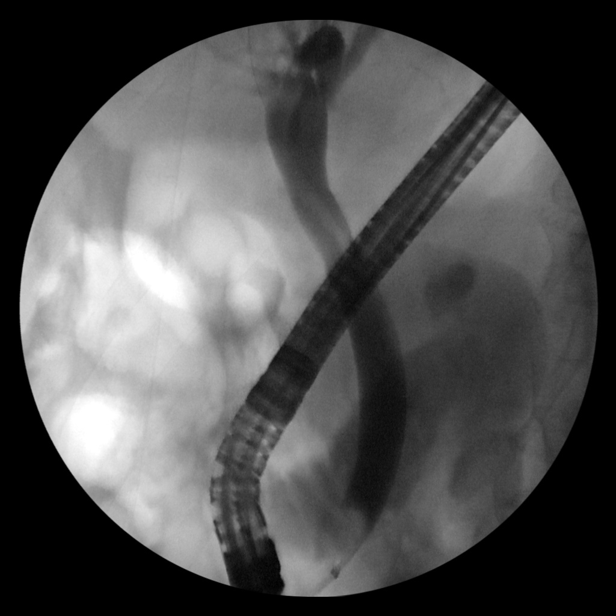
[im 8/8]
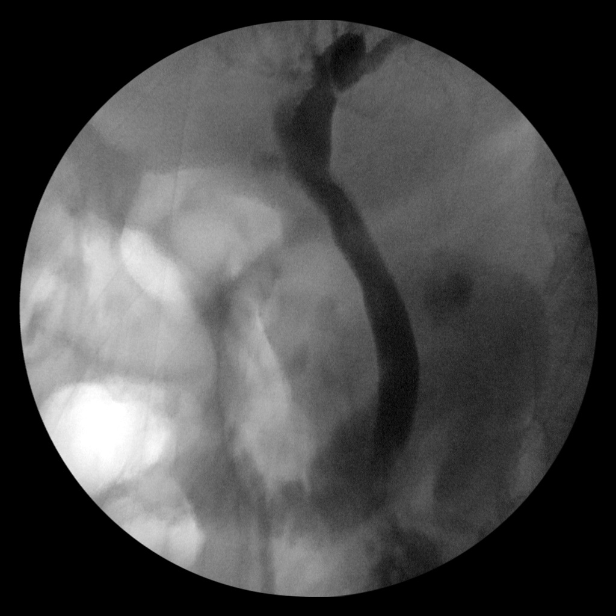

[15 of 22 positions shown; findings below may reference images not displayed]

FINDINGS: Limited intraoperative fluoroscopic spot images of ERCP.

Initial image demonstrates endoscope over the upper abdomen.

There is subsequently cannulation of the ampulla and retrograde
infusion of contrast. Partial opacification of the extrahepatic
biliary ducts with evidence of filling defect representative of
choledocholithiasis.

There is then deployment of a retrieval balloon.

Final image demonstrates contrast within the common bile duct with
no filling defect.
IMPRESSION: Limited images of ERCP demonstrates treatment of choledocholithiasis
with deployment of a retrieval balloon.

Please refer to the dictated operative report for full details of
intraoperative findings and procedure.

## 2020-01-26 IMAGING — CR DG ABDOMEN 1V
1 series · 1 of 1 positions shown · non-contrast
Comparison: Intraoperative fluoroscopic images from ERCP 01/15/2018

CLINICAL DATA: Postop ERCP.

EXAM:
ABDOMEN - 1 VIEW

[supine ap]
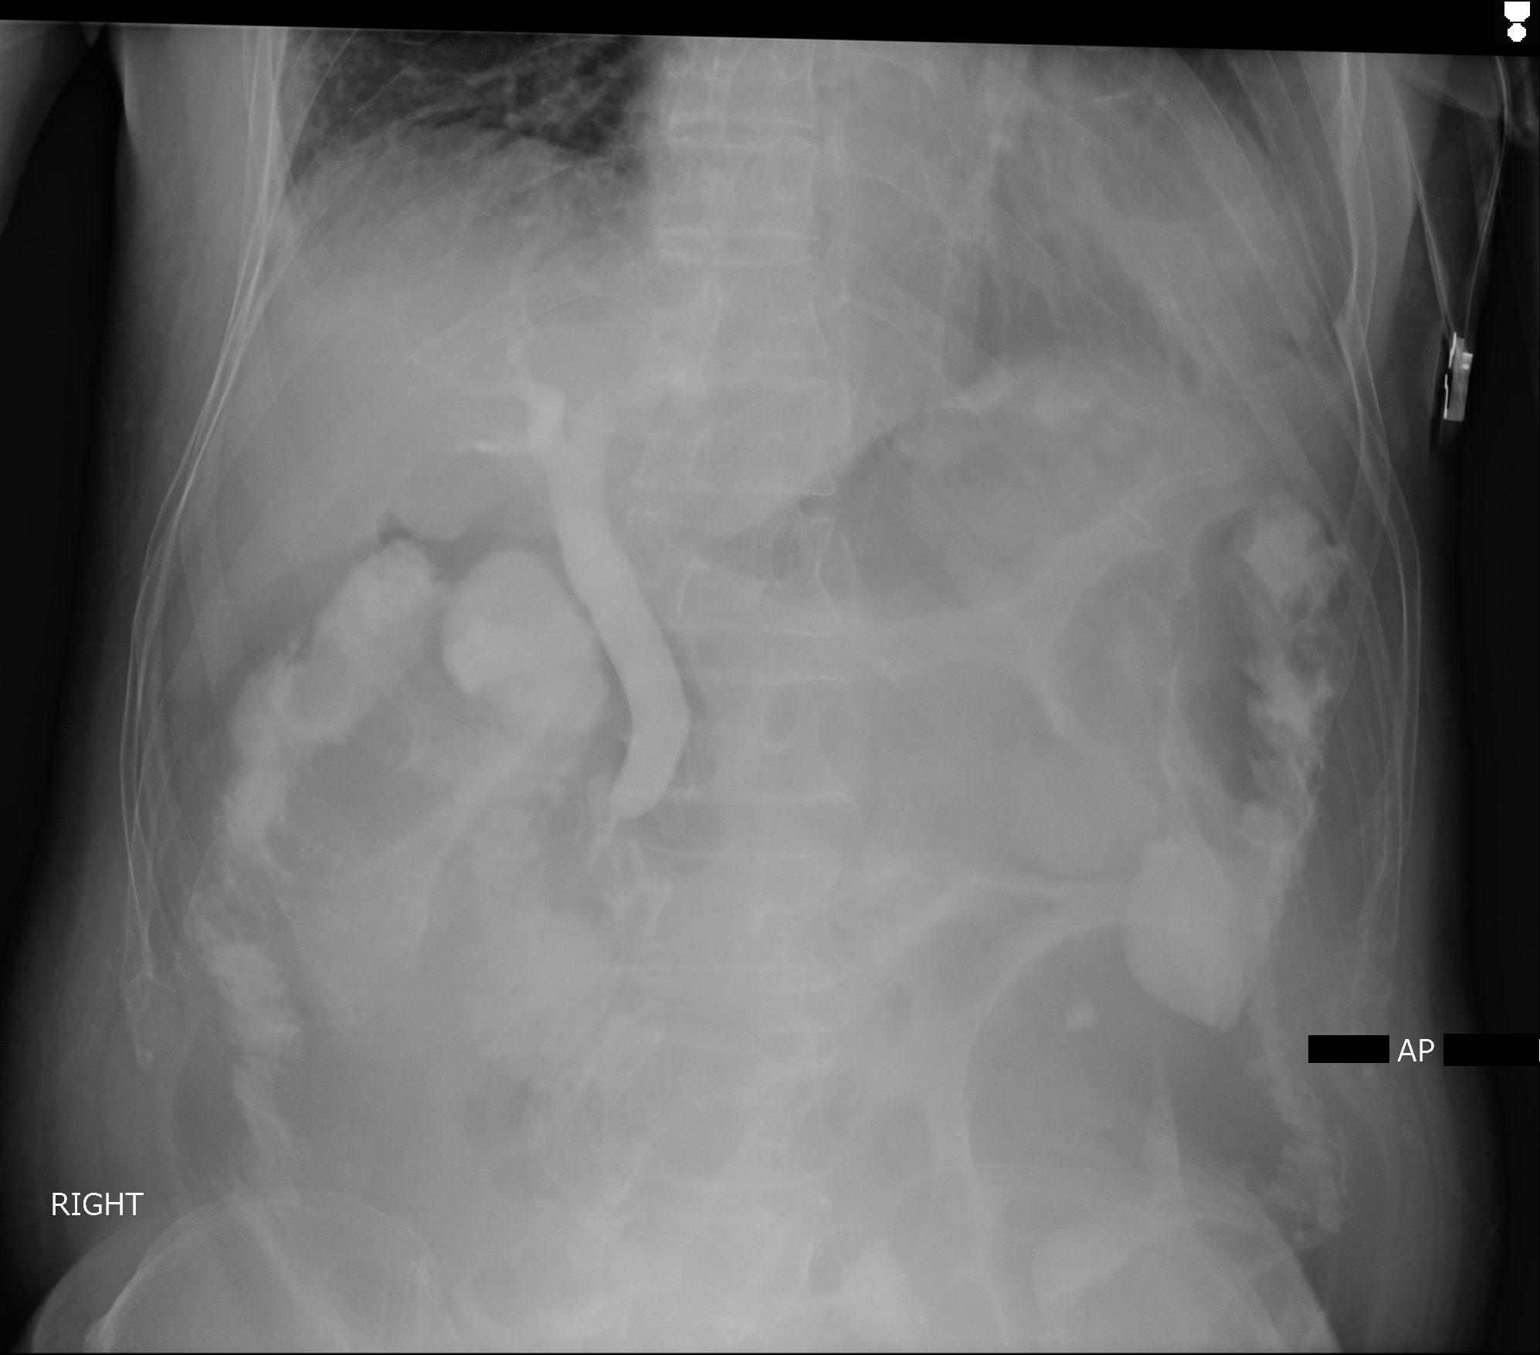

[1 of 1 positions shown; findings below may reference images not displayed]

FINDINGS: Contrast opacifies the extrahepatic and central intrahepatic bile
ducts. The common bile duct appears dilated without filling defects
seen. Contrast is also noted in bowel.
IMPRESSION: Dilated common bile duct without filling defects seen to suggest
residual stones.

## 2020-02-12 ENCOUNTER — Emergency Department (HOSPITAL_COMMUNITY): Payer: Medicare Other

## 2020-02-12 ENCOUNTER — Inpatient Hospital Stay (HOSPITAL_COMMUNITY)
Admission: EM | Admit: 2020-02-12 | Discharge: 2020-02-14 | DRG: 689 | Disposition: A | Discharge status: Skilled Nursing Facility | Payer: Medicare Other | Source: Skilled Nursing Facility | Attending: Internal Medicine | Admitting: Internal Medicine

## 2020-02-12 ENCOUNTER — Encounter (HOSPITAL_COMMUNITY): Payer: Self-pay

## 2020-02-12 ENCOUNTER — Other Ambulatory Visit: Payer: Self-pay

## 2020-02-12 DIAGNOSIS — Z993 Dependence on wheelchair: Secondary | ICD-10-CM

## 2020-02-12 DIAGNOSIS — R41841 Cognitive communication deficit: Secondary | ICD-10-CM | POA: Diagnosis present

## 2020-02-12 DIAGNOSIS — N1831 Chronic kidney disease, stage 3a: Secondary | ICD-10-CM

## 2020-02-12 DIAGNOSIS — N3000 Acute cystitis without hematuria: Principal | ICD-10-CM | POA: Diagnosis present

## 2020-02-12 DIAGNOSIS — Z8673 Personal history of transient ischemic attack (TIA), and cerebral infarction without residual deficits: Secondary | ICD-10-CM | POA: Diagnosis not present

## 2020-02-12 DIAGNOSIS — F0390 Unspecified dementia without behavioral disturbance: Secondary | ICD-10-CM | POA: Diagnosis not present

## 2020-02-12 DIAGNOSIS — E039 Hypothyroidism, unspecified: Secondary | ICD-10-CM | POA: Diagnosis present

## 2020-02-12 DIAGNOSIS — Z880 Allergy status to penicillin: Secondary | ICD-10-CM

## 2020-02-12 DIAGNOSIS — Z66 Do not resuscitate: Secondary | ICD-10-CM | POA: Diagnosis present

## 2020-02-12 DIAGNOSIS — Z86718 Personal history of other venous thrombosis and embolism: Secondary | ICD-10-CM

## 2020-02-12 DIAGNOSIS — G9341 Metabolic encephalopathy: Secondary | ICD-10-CM | POA: Diagnosis present

## 2020-02-12 DIAGNOSIS — Z7901 Long term (current) use of anticoagulants: Secondary | ICD-10-CM | POA: Diagnosis not present

## 2020-02-12 DIAGNOSIS — N179 Acute kidney failure, unspecified: Secondary | ICD-10-CM | POA: Diagnosis present

## 2020-02-12 DIAGNOSIS — N390 Urinary tract infection, site not specified: Secondary | ICD-10-CM

## 2020-02-12 DIAGNOSIS — Z79890 Hormone replacement therapy (postmenopausal): Secondary | ICD-10-CM

## 2020-02-12 DIAGNOSIS — G309 Alzheimer's disease, unspecified: Secondary | ICD-10-CM | POA: Diagnosis present

## 2020-02-12 DIAGNOSIS — N183 Chronic kidney disease, stage 3 unspecified: Secondary | ICD-10-CM | POA: Diagnosis present

## 2020-02-12 DIAGNOSIS — F0280 Dementia in other diseases classified elsewhere without behavioral disturbance: Secondary | ICD-10-CM | POA: Diagnosis present

## 2020-02-12 DIAGNOSIS — R4182 Altered mental status, unspecified: Secondary | ICD-10-CM

## 2020-02-12 DIAGNOSIS — Z8616 Personal history of COVID-19: Secondary | ICD-10-CM

## 2020-02-12 DIAGNOSIS — Z20822 Contact with and (suspected) exposure to covid-19: Secondary | ICD-10-CM | POA: Diagnosis present

## 2020-02-12 HISTORY — DX: Cognitive communication deficit: R41.841

## 2020-02-12 HISTORY — DX: Disorder of thyroid, unspecified: E07.9

## 2020-02-12 HISTORY — DX: Disorder of kidney and ureter, unspecified: N28.9

## 2020-02-12 HISTORY — DX: Dysphagia, unspecified: R13.10

## 2020-02-12 HISTORY — DX: COVID-19: U07.1

## 2020-02-12 HISTORY — DX: Unspecified protein-calorie malnutrition: E46

## 2020-02-12 HISTORY — DX: Unspecified dementia, unspecified severity, without behavioral disturbance, psychotic disturbance, mood disturbance, and anxiety: F03.90

## 2020-02-12 LAB — URINALYSIS, ROUTINE W REFLEX MICROSCOPIC
Bilirubin Urine: NEGATIVE
Glucose, UA: NEGATIVE mg/dL
Ketones, ur: NEGATIVE mg/dL
Nitrite: NEGATIVE
Protein, ur: 30 mg/dL — AB
Specific Gravity, Urine: 1.012 (ref 1.005–1.030)
pH: 5 (ref 5.0–8.0)

## 2020-02-12 LAB — CBC WITH DIFFERENTIAL/PLATELET
Abs Immature Granulocytes: 0.02 10*3/uL (ref 0.00–0.07)
Basophils Absolute: 0 10*3/uL (ref 0.0–0.1)
Basophils Relative: 1 %
Eosinophils Absolute: 0.1 10*3/uL (ref 0.0–0.5)
Eosinophils Relative: 2 %
HCT: 53.5 % — ABNORMAL HIGH (ref 39.0–52.0)
Hemoglobin: 17.3 g/dL — ABNORMAL HIGH (ref 13.0–17.0)
Immature Granulocytes: 0 %
Lymphocytes Relative: 21 %
Lymphs Abs: 1.6 10*3/uL (ref 0.7–4.0)
MCH: 29.7 pg (ref 26.0–34.0)
MCHC: 32.3 g/dL (ref 30.0–36.0)
MCV: 91.9 fL (ref 80.0–100.0)
Monocytes Absolute: 1 10*3/uL (ref 0.1–1.0)
Monocytes Relative: 13 %
Neutro Abs: 4.7 10*3/uL (ref 1.7–7.7)
Neutrophils Relative %: 63 %
Platelets: 156 10*3/uL (ref 150–400)
RBC: 5.82 MIL/uL — ABNORMAL HIGH (ref 4.22–5.81)
RDW: 14.4 % (ref 11.5–15.5)
WBC: 7.4 10*3/uL (ref 4.0–10.5)
nRBC: 0 % (ref 0.0–0.2)

## 2020-02-12 LAB — SARS CORONAVIRUS 2 BY RT PCR (HOSPITAL ORDER, PERFORMED IN ~~LOC~~ HOSPITAL LAB): SARS Coronavirus 2: NEGATIVE

## 2020-02-12 LAB — COMPREHENSIVE METABOLIC PANEL
ALT: 11 U/L (ref 0–44)
AST: 19 U/L (ref 15–41)
Albumin: 3.8 g/dL (ref 3.5–5.0)
Alkaline Phosphatase: 84 U/L (ref 38–126)
Anion gap: 11 (ref 5–15)
BUN: 16 mg/dL (ref 8–23)
CO2: 28 mmol/L (ref 22–32)
Calcium: 9.9 mg/dL (ref 8.9–10.3)
Chloride: 106 mmol/L (ref 98–111)
Creatinine, Ser: 1.63 mg/dL — ABNORMAL HIGH (ref 0.61–1.24)
GFR calc Af Amer: 44 mL/min — ABNORMAL LOW (ref >60)
GFR calc non Af Amer: 38 mL/min — ABNORMAL LOW (ref >60)
Glucose, Bld: 115 mg/dL — ABNORMAL HIGH (ref 70–99)
Potassium: 4.6 mmol/L (ref 3.5–5.1)
Sodium: 145 mmol/L (ref 135–145)
Total Bilirubin: 0.8 mg/dL (ref 0.3–1.2)
Total Protein: 7.9 g/dL (ref 6.5–8.1)

## 2020-02-12 LAB — LACTIC ACID, PLASMA
Lactic Acid, Venous: 5.3 mmol/L (ref 0.5–1.9)
Lactic Acid, Venous: 2.3 mmol/L (ref 0.5–1.9)

## 2020-02-12 LAB — PROCALCITONIN: Procalcitonin: <0.1 ng/mL

## 2020-02-12 MED ORDER — SODIUM CHLORIDE 0.9 % IV BOLUS
1500.0000 mL | Freq: Once | INTRAVENOUS | Status: AC
  Administered 2020-02-12: 1500 mL via INTRAVENOUS

## 2020-02-12 MED ORDER — POLYETHYLENE GLYCOL 3350 17 G PO PACK
17.0000 g | PACK | Freq: Every day | ORAL | Status: DC
  Administered 2020-02-13 – 2020-02-14 (×2): 17 g via ORAL
  Filled 2020-02-12 (×2): qty 1

## 2020-02-12 MED ORDER — SENNA 8.6 MG PO TABS: 1.0000 | ORAL_TABLET | Freq: Every evening | ORAL | Status: DC | PRN

## 2020-02-12 MED ORDER — SODIUM CHLORIDE 0.9 % IV SOLN: INTRAVENOUS | Status: DC

## 2020-02-12 MED ORDER — CHLORHEXIDINE GLUCONATE CLOTH 2 % EX PADS
6.0000 | MEDICATED_PAD | Freq: Every day | CUTANEOUS | Status: DC
  Administered 2020-02-12 – 2020-02-13 (×2): 6 via TOPICAL

## 2020-02-12 MED ORDER — VANCOMYCIN HCL IN DEXTROSE 1-5 GM/200ML-% IV SOLN
1000.0000 mg | Freq: Once | INTRAVENOUS | Status: AC
  Administered 2020-02-12: 1000 mg via INTRAVENOUS
  Filled 2020-02-12: qty 200

## 2020-02-12 MED ORDER — OLANZAPINE 5 MG PO TABS
2.5000 mg | ORAL_TABLET | Freq: Every day | ORAL | Status: DC
  Administered 2020-02-12 – 2020-02-13 (×2): 2.5 mg via ORAL
  Filled 2020-02-12 (×2): qty 1

## 2020-02-12 MED ORDER — MEMANTINE HCL 10 MG PO TABS
10.0000 mg | ORAL_TABLET | Freq: Two times a day (BID) | ORAL | Status: DC
  Administered 2020-02-12 – 2020-02-14 (×4): 10 mg via ORAL
  Filled 2020-02-12 (×4): qty 1

## 2020-02-12 MED ORDER — CIPROFLOXACIN IN D5W 400 MG/200ML IV SOLN
400.0000 mg | Freq: Two times a day (BID) | INTRAVENOUS | Status: DC
  Administered 2020-02-12 – 2020-02-13 (×3): 400 mg via INTRAVENOUS
  Filled 2020-02-12 (×3): qty 200

## 2020-02-12 MED ORDER — ORAL CARE MOUTH RINSE
15.0000 mL | Freq: Two times a day (BID) | OROMUCOSAL | Status: DC
  Administered 2020-02-12 – 2020-02-14 (×4): 15 mL via OROMUCOSAL

## 2020-02-12 MED ORDER — LEVOTHYROXINE SODIUM 75 MCG PO TABS
37.5000 ug | ORAL_TABLET | Freq: Every day | ORAL | Status: DC
  Administered 2020-02-13 – 2020-02-14 (×2): 37.5 ug via ORAL
  Filled 2020-02-12 (×2): qty 1

## 2020-02-12 MED ORDER — VANCOMYCIN HCL 500 MG/100ML IV SOLN
500.0000 mg | INTRAVENOUS | Status: DC
  Filled 2020-02-12: qty 100

## 2020-02-12 MED ORDER — CIPROFLOXACIN IN D5W 400 MG/200ML IV SOLN
400.0000 mg | Freq: Once | INTRAVENOUS | Status: AC
  Administered 2020-02-12: 400 mg via INTRAVENOUS
  Filled 2020-02-12: qty 200

## 2020-02-12 MED ORDER — SODIUM CHLORIDE 0.9 % IV BOLUS
500.0000 mL | Freq: Once | INTRAVENOUS | Status: AC
  Administered 2020-02-12: 500 mL via INTRAVENOUS

## 2020-02-12 MED ORDER — APIXABAN 2.5 MG PO TABS
2.5000 mg | ORAL_TABLET | Freq: Two times a day (BID) | ORAL | Status: DC
  Administered 2020-02-12 – 2020-02-14 (×4): 2.5 mg via ORAL
  Filled 2020-02-12 (×4): qty 1

## 2020-02-12 NOTE — ED Provider Notes (Signed)
[facility] EMERGENCY DEPARTMENT Provider Note   CSN: [id number] Arrival date & time: 02/12/20  6761     History Chief Complaint  Patient presents with  . Altered Mental Status    Robert Hurst is a 86 y.o. male.  Patient sent over from [place] nursing facility.  Patient apparently more confused than baseline.  Does have a history of Alzheimer's disease.  He is wheelchair bound.        Past Medical History:  Diagnosis Date  . Alzheimer disease (HCC)   . Cognitive communication deficit   . COVID-19   . Dementia (HCC)   . DVT (deep venous thrombosis) (HCC)   . Dysphagia   . Malnourished (HCC)   . Protein calorie malnutrition (HCC)   . Renal disorder   . Thyroid disease     Patient Active Problem List   Diagnosis Date Noted  . Acute cystitis without hematuria   . Goals of care, counseling/discussion   . Palliative care encounter   . Acute encephalopathy 08/14/2019  . Sepsis due to Enterococcus (HCC) 08/11/2019  . Acute metabolic encephalopathy 08/11/2019  . Syncope and collapse 08/11/2019  . Syncope   . Sepsis secondary to UTI (HCC) 08/08/2019  . Dementia without behavioral disturbance (HCC) 08/08/2019  . Acute renal failure superimposed on stage 3b chronic kidney disease (HCC) 08/08/2019  . Lactic acidosis 08/08/2019  . Incontinence 08/08/2019    Past Surgical History:  Procedure Laterality Date  . APPENDECTOMY    . BALLOON DILATION N/A 01/15/2018   Procedure: BALLOON DILATION;  Surgeon: Malissa Hippo, MD;  [facility];  Service: Endoscopy;  Laterality: N/A;  . ERCP N/A 01/15/2018   Procedure: ENDOSCOPIC RETROGRADE CHOLANGIOPANCREATOGRAPHY (ERCP);  Surgeon: Malissa Hippo, MD;  [facility];  Service: Endoscopy;  Laterality: N/A;  . REMOVAL OF STONES N/A 01/15/2018   Procedure: REMOVAL OF STONES;  Surgeon: Malissa Hippo, MD;  [facility];  Service: Endoscopy;  Laterality: N/A;  . SPHINCTEROTOMY N/A 01/15/2018   Procedure: SPHINCTEROTOMY;  Surgeon: Malissa Hippo, MD;  Location[facility];  Service: Endoscopy;  Laterality: N/A;  . TONSILLECTOMY         No family history on file.  Social History   Tobacco Use  . Smoking status: Never Smoker  . Smokeless tobacco: Never Used  Substance Use Topics  . Alcohol use: Not Currently  . Drug use: Not Currently    Home Medications Prior to Admission medications   Medication Sig Start Date End Date Taking? Authorizing Provider  apixaban (ELIQUIS) 2.5 MG TABS tablet Take 1 tablet (2.5 mg total) by mouth 2 (two) times daily. 08/17/19 02/12/20 Yes Shah, Pratik D, DO  levothyroxine (SYNTHROID) 75 MCG tablet Take 0.5 tablets (37.5 mcg total) by mouth daily at 6 (six) AM. 08/13/19  Yes Tat, Onalee Hua, MD  memantine (NAMENDA) 10 MG tablet Take 10 mg by mouth 2 (two) times daily.   Yes [provider]  polyethylene glycol (MIRALAX / GLYCOLAX) 17 g packet Take 17 g by mouth daily.   Yes [provider]  senna (SENOKOT) 8.6 MG tablet Take 1 tablet by mouth daily.   Yes [provider]  OLANZapine (ZYPREXA) 2.5 MG tablet Take 2.5 mg by mouth at bedtime.    [provider]    Allergies    Penicillins  Review of Systems   Review of Systems  Unable to perform ROS: Dementia    Physical Exam Updated Vital Signs BP  116/76   Pulse (!) 59   Temp 97.8 F (36.6 C) (Oral)   Resp 11   Wt 51 kg   SpO2 97%   BMI 19.30 kg/m   Physical Exam Vitals and nursing note reviewed.  Constitutional:      Appearance: He is well-developed.  HENT:     Head: Normocephalic and atraumatic.  Eyes:     Extraocular Movements: Extraocular movements intact.     Conjunctiva/sclera: Conjunctivae normal.     Pupils: Pupils are equal, round, and reactive to light.  Cardiovascular:     Rate and Rhythm: Normal rate and regular rhythm.     Heart sounds: No murmur.  Pulmonary:     Effort: Pulmonary effort is normal. No respiratory distress.       Breath sounds: Normal breath sounds.  Abdominal:     Palpations: Abdomen is soft.     Tenderness: There is no abdominal tenderness.  Musculoskeletal:        General: No swelling.     Cervical back: Neck supple.  Skin:    General: Skin is warm and dry.  Neurological:     Mental Status: He is alert.     Comments: Patient eyes will open any resist having his eyes open.  But patient for the most part nonverbal.  Spontaneously moving all extremities.     ED Results / Procedures / Treatments   Labs (all labs ordered are listed, but only abnormal results are displayed) Labs Reviewed  COMPREHENSIVE METABOLIC PANEL - Abnormal; Notable for the following components:      Result Value   Glucose, Bld 115 (*)    Creatinine, Ser 1.63 (*)    GFR calc non Af Amer 38 (*)    GFR calc Af Amer 44 (*)    All other components within normal limits  CBC WITH DIFFERENTIAL/PLATELET - Abnormal; Notable for the following components:   RBC 5.82 (*)    Hemoglobin 17.3 (*)    HCT 53.5 (*)    All other components within normal limits  URINALYSIS, ROUTINE W REFLEX MICROSCOPIC    EKG EKG Interpretation  Date/Time:  Saturday Feb 12 2020 09:39:01 EDT Ventricular Rate:  59 PR Interval:    QRS Duration: 90 QT Interval:  573 QTC Calculation: 568 R Axis:   3 Text Interpretation: Sinus rhythm Low voltage, precordial leads Abnormal R-wave progression, early transition Nonspecific T abnrm, anterolateral leads Prolonged QT interval Confirmed by Vanetta Mulders [id number]) on 02/12/2020 9:42:50 AM   Radiology CT Head Wo Contrast  Result Date: 02/12/2020 CLINICAL DATA:  Altered mental status with unclear cause EXAM: CT HEAD WITHOUT CONTRAST TECHNIQUE: Contiguous axial images were obtained from the base of the skull through the vertex without intravenous contrast. COMPARISON:  08/14/2019 FINDINGS: Brain: No evidence of acute infarction, hemorrhage, hydrocephalus, extra-axial collection or mass lesion/mass effect.  Confluent low-density in the cerebral white matter attributed to chronic small vessel ischemia. Vascular: Prominent density of the right M1 segment on reformats is attributed to streak artifact. No unexpected atherosclerotic calcification Skull: Negative for fracture or bone lesion Sinuses/Orbits: Negative IMPRESSION: 1. No acute finding. 2. Extensive chronic small vessel ischemia in the cerebral white matter. Electronically Signed   By: Marnee Spring M.D.   On: 02/12/2020 10:56   DG Chest Port 1 View  Result Date: 02/12/2020 CLINICAL DATA:  86 year old male with a history altered mental status EXAM: PORTABLE CHEST 1 VIEW COMPARISON:  08/08/2019 FINDINGS: Cardiomediastinal silhouette unchanged in size and contour. Similar appearance  of asymmetric elevation of the right hemidiaphragm. Coarsened interstitial markings. No pneumothorax or pleural effusion. No confluent airspace disease. Osteopenia.  No acute displaced fracture. IMPRESSION: Chronic lung changes without evidence of acute cardiopulmonary disease Electronically Signed   By: Corrie Mckusick D.O.   On: 02/12/2020 10:47    Procedures Procedures (including critical care time)  Medications Ordered in ED Medications  0.9 %  sodium chloride infusion (has no administration in time range)  sodium chloride 0.9 % bolus 500 mL (has no administration in time range)    ED Course  I have reviewed the triage vital signs and the nursing notes.  Pertinent labs & imaging results that were available during my care of the patient were reviewed by me and considered in my medical decision making (see chart for details).    MDM Rules/Calculators/A&P                     Work-up for the altered mental status head CT without acute findings chest or acute findings.  Patient's hemoglobin hematocrit very concentrated.  Will hydrate.  Check urine.  Then reevaluate.  Possible urinary tract infection may very well explain the circumstances.  Rest of work-up so far  without any significant findings.  Patient's mental status unchanged while here.  Patient turned over to evening physician Dr. Roderic Palau who will reassess and disposition.  Final Clinical Impression(s) / ED Diagnoses Final diagnoses:  Altered mental status, unspecified altered mental status type    Rx / DC Orders ED Discharge Orders    None       Fredia Sorrow, MD 02/12/20 [phone]

## 2020-02-12 NOTE — Progress Notes (Signed)
CRITICAL VALUE ALERT  Critical Value:  Lactic Acid 5.3  Date & Time Notied:  02/12/20, 2040  Provider Notified: Adrian Blackwater  Orders Received/Actions taken:

## 2020-02-12 NOTE — ED Triage Notes (Addendum)
Attempted in and out cath  Unable to pass the prostate to gain urine   Dr Deretha Emory informed

## 2020-02-12 NOTE — ED Notes (Signed)
Coude catheter with return of 550 cc urine

## 2020-02-12 NOTE — ED Notes (Addendum)
Bladder scan showed , still no urine with external cath

## 2020-02-12 NOTE — ED Notes (Signed)
Spoke with pt daughter   [name] Zenaida Niece)  [phone]  Who is here w pt, in WR or car  Plan of care discussed

## 2020-02-12 NOTE — ED Notes (Signed)
Sent from [place] for eval after reported altered mental status by staff

## 2020-02-12 NOTE — Progress Notes (Signed)
Pharmacy Antibiotic Note  Robert Hurst is a 86 y.o. male admitted on 02/12/2020 with sepsis due to possible urinary  Pharmacy has been consulted for vancomcyin dosing.  Plan: Loading dose: vancomycin 1g  IV x1 dose Maintenance dose: vancomcyin 500mg  IV  q24h Goal vancomycin trough range: 15-20 mcg/mL Pharmacy will continue to monitor renal function, vancomycin troughs as clinically appropriate,  cultures and patient progress.   Height: 5\' 4"  (162.6 cm) Weight: 51 kg (112 lb 7 oz) IBW/kg (Calculated) : 59.2  Temp (24hrs), Avg:97.8 F (36.6 C), Min:97.8 F (36.6 C), Max:97.8 F (36.6 C)  Recent Labs  Lab 02/12/20 1019 02/12/20 1856  WBC 7.4  --   CREATININE 1.63*  --   LATICACIDVEN  --  5.3*    Estimated Creatinine Clearance: 23.9 mL/min (A) (by C-G formula based on SCr of 1.63 mg/dL (H)).    Allergies  Allergen Reactions  . Penicillins     Has patient had a PCN reaction causing immediate rash, facial/tongue/throat swelling, SOB or lightheadedness with hypotension: Unknown Has patient had a PCN reaction causing severe rash involving mucus membranes or skin necrosis: Unknown Has patient had a PCN reaction that required hospitalization: Unknown Has patient had a PCN reaction occurring within the last 10 years: Unknown If all of the above answers are "NO", then may proceed with Cephalosporin use.     Antimicrobials this admission: vancomycin 5/22 >>   ciprofloxacin 5/22>>   Dose adjustments this admission: vancomycin  Microbiology results: 5/22 [facility] x2:   5/22 UCx:    5/22 Resp PCR: SARS CoV-2 negative   Thank you for allowing pharmacy to be a part of this patient's care.  6/22 02/12/2020 9:10 PM

## 2020-02-12 NOTE — H&P (Signed)
History and Physical  Robert Hurst XHB:[id number] DOB: [birth year] DOA: 02/12/2020  Referring physician: Dr Aileen Pilot, ED physician PCP: [name], MD  Outpatient Specialists:   Patient Coming From: [facility]  Chief Complaint: confusion  HPI: Robert Hurst is a 86 y.o. male with a history of Alzheimer's dementia, history of stroke COVID-19, history of DVT on Eliquis, hypothyroidism.  Patient brought to the hospital by EMS secondary to increased confusion over the past 2 days.  The patient normally walks and feeds himself.  Today, he is minimally responsive, nonverbal, and not feeding himself.  He is unable to provide history.  No palliating or provoking factors.  Emergency Department Course: White count normal, creatinine stable.  UA suggestive of UTI.  Ciprofloxacin started  Review of Systems:  Unable to obtain   Past Medical History:  Diagnosis Date  . Alzheimer disease (HCC)   . Cognitive communication deficit   . COVID-19   . Dementia (HCC)   . DVT (deep venous thrombosis) (HCC)   . Dysphagia   . Malnourished (HCC)   . Protein calorie malnutrition (HCC)   . Renal disorder   . Thyroid disease    Past Surgical History:  Procedure Laterality Date  . APPENDECTOMY    . BALLOON DILATION N/A 01/15/2018   Procedure: BALLOON DILATION;  Surgeon: Malissa Hippo, MD;  [facility];  Service: Endoscopy;  Laterality: N/A;  . ERCP N/A 01/15/2018   Procedure: ENDOSCOPIC RETROGRADE CHOLANGIOPANCREATOGRAPHY (ERCP);  Surgeon: Malissa Hippo, MD;  [facility];  Service: Endoscopy;  Laterality: N/A;  . REMOVAL OF STONES N/A 01/15/2018   Procedure: REMOVAL OF STONES;  Surgeon: Malissa Hippo, MD;  [facility];  Service: Endoscopy;  Laterality: N/A;  . SPHINCTEROTOMY N/A 01/15/2018   Procedure: SPHINCTEROTOMY;  Surgeon: Malissa Hippo, MD;  [facility];  Service: Endoscopy;  Laterality: N/A;  . TONSILLECTOMY     Social History:   reports that he has never smoked. He has never used smokeless tobacco. He reports previous alcohol use. He reports previous drug use. Patient lives at home  Allergies  Allergen Reactions  . Penicillins     Has patient had a PCN reaction causing immediate rash, facial/tongue/throat swelling, SOB or lightheadedness with hypotension: Unknown Has patient had a PCN reaction causing severe rash involving mucus membranes or skin necrosis: Unknown Has patient had a PCN reaction that required hospitalization: Unknown Has patient had a PCN reaction occurring within the last 10 years: Unknown If all of the above answers are "NO", then may proceed with Cephalosporin use.     No family history on file.  Patient unable to provide  Prior to Admission medications   Medication Sig Start Date End Date Taking? Authorizing Provider  apixaban (ELIQUIS) 2.5 MG TABS tablet Take 1 tablet (2.5 mg total) by mouth 2 (two) times daily. 08/17/19 02/12/20 Yes Shah, Pratik D, DO  levothyroxine (SYNTHROID) 75 MCG tablet Take 0.5 tablets (37.5 mcg total) by mouth daily at 6 (six) AM. 08/13/19  Yes Tat, Onalee Hua, MD  memantine (NAMENDA) 10 MG tablet Take 10 mg by mouth 2 (two) times daily.   Yes [provider]  polyethylene glycol (MIRALAX / GLYCOLAX) 17 g packet Take 17 g by mouth daily.   Yes [provider]  senna (SENOKOT) 8.6 MG tablet Take 1 tablet by mouth daily.   Yes [provider]  OLANZapine (ZYPREXA) 2.5 MG tablet Take 2.5 mg by mouth at  bedtime.    [provider]    Physical Exam: BP 117/69   Pulse (!) 51   Temp 97.8 F (36.6 C) (Oral)   Resp 11   Wt 51 kg   SpO2 99%   BMI 19.30 kg/m   . General: Elderly male.  Somnolent, but easily awakes.  Unable to determine orientation no acute cardiopulmonary distress.  Marland Kitchen HEENT: Normocephalic atraumatic.  Right and left ears normal in appearance.  Pupils equal, round, reactive to light. Extraocular muscles are intact. Sclerae  anicteric and noninjected.  Moist mucosal membranes. No mucosal lesions.  . Neck: Neck supple without lymphadenopathy. No carotid bruits. No masses palpated.  . Cardiovascular: Regular rate with normal S1-S2 sounds. No murmurs, rubs, gallops auscultated. No JVD.  Marland Kitchen Respiratory: Good respiratory effort with no wheezes, rales, rhonchi. Lungs clear to auscultation bilaterally.  No accessory muscle use. . Abdomen: Soft, nontender, nondistended. Active bowel sounds. No masses or hepatosplenomegaly  . Skin: No rashes, lesions, or ulcerations.  Dry, warm to touch. 2+ dorsalis pedis and radial pulses. . Musculoskeletal: No calf or leg pain. All major joints not erythematous nontender.  No upper or lower joint deformation.  Good ROM.  No contractures  . Psychiatric: Unable to assess . Neurologic: Unable to assess, patient moves all 4 extremities without difficulty           Labs on Admission: I have personally reviewed following labs and imaging studies  CBC: Recent Labs  Lab 02/12/20 1019  WBC 7.4  NEUTROABS 4.7  HGB 17.3*  HCT 53.5*  MCV 91.9  PLT 973   Basic Metabolic Panel: Recent Labs  Lab 02/12/20 1019  NA 145  K 4.6  CL 106  CO2 28  GLUCOSE 115*  BUN 16  CREATININE 1.63*  CALCIUM 9.9   GFR: CrCl cannot be calculated (Unknown ideal weight.). Liver Function Tests: Recent Labs  Lab 02/12/20 1019  AST 19  ALT 11  ALKPHOS 84  BILITOT 0.8  PROT 7.9  ALBUMIN 3.8   No results for input(s): LIPASE, AMYLASE in the last 168 hours. No results for input(s): AMMONIA in the last 168 hours. Coagulation Profile: No results for input(s): INR, PROTIME in the last 168 hours. Cardiac Enzymes: No results for input(s): CKTOTAL, CKMB, CKMBINDEX, TROPONINI in the last 168 hours. BNP (last 3 results) No results for input(s): PROBNP in the last 8760 hours. HbA1C: No results for input(s): HGBA1C in the last 72 hours. CBG: No results for input(s): GLUCAP in the last 168 hours. Lipid  Profile: No results for input(s): CHOL, HDL, LDLCALC, TRIG, CHOLHDL, LDLDIRECT in the last 72 hours. Thyroid Function Tests: No results for input(s): TSH, T4TOTAL, FREET4, T3FREE, THYROIDAB in the last 72 hours. Anemia Panel: No results for input(s): VITAMINB12, FOLATE, FERRITIN, TIBC, IRON, RETICCTPCT in the last 72 hours. Urine analysis:    Component Value Date/Time   COLORURINE YELLOW 02/12/2020 1545   APPEARANCEUR HAZY (A) 02/12/2020 1545   LABSPEC 1.012 02/12/2020 1545   PHURINE 5.0 02/12/2020 1545   GLUCOSEU NEGATIVE 02/12/2020 1545   HGBUR MODERATE (A) 02/12/2020 1545   BILIRUBINUR NEGATIVE 02/12/2020 [place] 02/12/2020 1545   PROTEINUR 30 (A) 02/12/2020 1545   NITRITE NEGATIVE 02/12/2020 1545   LEUKOCYTESUR MODERATE (A) 02/12/2020 1545   Sepsis Labs: @LABRCNTIP (procalcitonin:4,lacticidven:4) )No results found for this or any previous visit (from the past 240 hour(s)).   Radiological Exams on Admission: CT Head Wo Contrast  Result Date: 02/12/2020 CLINICAL DATA:  Altered  mental status with unclear cause EXAM: CT HEAD WITHOUT CONTRAST TECHNIQUE: Contiguous axial images were obtained from the base of the skull through the vertex without intravenous contrast. COMPARISON:  08/14/2019 FINDINGS: Brain: No evidence of acute infarction, hemorrhage, hydrocephalus, extra-axial collection or mass lesion/mass effect. Confluent low-density in the cerebral white matter attributed to chronic small vessel ischemia. Vascular: Prominent density of the right M1 segment on reformats is attributed to streak artifact. No unexpected atherosclerotic calcification Skull: Negative for fracture or bone lesion Sinuses/Orbits: Negative IMPRESSION: 1. No acute finding. 2. Extensive chronic small vessel ischemia in the cerebral white matter. Electronically Signed   By: Marnee Spring M.D.   On: 02/12/2020 10:56   DG Chest Port 1 View  Result Date: 02/12/2020 CLINICAL DATA:  86 year old  male with a history altered mental status EXAM: PORTABLE CHEST 1 VIEW COMPARISON:  08/08/2019 FINDINGS: Cardiomediastinal silhouette unchanged in size and contour. Similar appearance of asymmetric elevation of the right hemidiaphragm. Coarsened interstitial markings. No pneumothorax or pleural effusion. No confluent airspace disease. Osteopenia.  No acute displaced fracture. IMPRESSION: Chronic lung changes without evidence of acute cardiopulmonary disease Electronically Signed   By: Gilmer Mor D.O.   On: 02/12/2020 10:47    EKG: Independently reviewed.  Sinus rhythm with poor R wave progression.  No acute ST changes.  Assessment/Plan: Principal Problem:   Acute metabolic encephalopathy Active Problems:   Dementia without behavioral disturbance (HCC)   Acute cystitis without hematuria   CKD (chronic kidney disease), stage III   Hypothyroidism   History of DVT (deep vein thrombosis)    This patient was discussed with the ED physician, including pertinent vitals, physical exam findings, labs, and imaging.  We also discussed care given by the ED provider.  1. Acute metabolic encephalopathy a. Likely secondary to UTI 2. UTI a. Urine cultures obtained b. We will obtain blood cultures, lactic acid, procalcitonin c. Continue ciprofloxacin d. Has history of Enterococcus faecalis that is pansensitive.  If patient starts to show signs of sepsis, may need to start vancomycin as ciprofloxacin does not get reliable blood levels 3. Dementia a. Continue home regimen 4. CKD a. Stable 5. Hypothyroidism a. Check TSH b. Continue levothyroxine 6. -History of DVT  DVT prophylaxis: Eliquis Consultants: None Code Status: DNR Family Communication: Unable to contact Disposition Plan: Patient should be able to return to [facility] home   [place], Rhona Raider, DO

## 2020-02-12 NOTE — ED Triage Notes (Signed)
EMS reports pt is a resident at [facility].  Staff told ems when she checked on pt today she noticed pt was hard to arouse.  Reports wouldn't eat and bp was low.  Reports bp was 80's systolic.  EMS says pt's LKW was sometime last night.  Reports pt is usually alert, oriented, and able to feed himself.

## 2020-02-12 NOTE — ED Notes (Signed)
Pt report to [facility] [facility], RN

## 2020-02-13 LAB — CBC
HCT: 46.7 % (ref 39.0–52.0)
Hemoglobin: 14.8 g/dL (ref 13.0–17.0)
MCH: 29.2 pg (ref 26.0–34.0)
MCHC: 31.7 g/dL (ref 30.0–36.0)
MCV: 92.1 fL (ref 80.0–100.0)
Platelets: 120 10*3/uL — ABNORMAL LOW (ref 150–400)
RBC: 5.07 MIL/uL (ref 4.22–5.81)
RDW: 14.2 % (ref 11.5–15.5)
WBC: 6.9 10*3/uL (ref 4.0–10.5)
nRBC: 0 % (ref 0.0–0.2)

## 2020-02-13 LAB — BASIC METABOLIC PANEL
Anion gap: 8 (ref 5–15)
BUN: 14 mg/dL (ref 8–23)
CO2: 27 mmol/L (ref 22–32)
Calcium: 8.6 mg/dL — ABNORMAL LOW (ref 8.9–10.3)
Chloride: 110 mmol/L (ref 98–111)
Creatinine, Ser: 1.38 mg/dL — ABNORMAL HIGH (ref 0.61–1.24)
GFR calc Af Amer: 54 mL/min — ABNORMAL LOW (ref >60)
GFR calc non Af Amer: 46 mL/min — ABNORMAL LOW (ref >60)
Glucose, Bld: 84 mg/dL (ref 70–99)
Potassium: 4.4 mmol/L (ref 3.5–5.1)
Sodium: 145 mmol/L (ref 135–145)

## 2020-02-13 LAB — TSH: TSH: 1.742 u[IU]/mL (ref 0.350–4.500)

## 2020-02-13 LAB — PROCALCITONIN: Procalcitonin: <0.1 ng/mL

## 2020-02-13 LAB — MRSA PCR SCREENING: MRSA by PCR: NEGATIVE

## 2020-02-13 NOTE — Progress Notes (Signed)
PHARMACY NOTE:  ANTIMICROBIAL RENAL DOSAGE ADJUSTMENT  Current antimicrobial regimen includes a mismatch between antimicrobial dosage and estimated renal function.  As per policy approved by the Pharmacy & Therapeutics and Medical Executive Committees, the antimicrobial dosage will be adjusted accordingly.  Current antimicrobial dosage: ciprofloxacin 400mg  IV q12h  Indication: encephalopathy  Renal Function:  Estimated Creatinine Clearance: 28.2 mL/min (A) (by C-G formula based on SCr of 1.38 mg/dL (H)). []      On intermittent HD, scheduled: []      On CRRT    Antimicrobial dosage has been changed to:  ciprofloxacin 400mg  IV q24h     Thank you for allowing pharmacy to be a part of this patient's care.  , [facility] 02/13/2020 12:36 PM

## 2020-02-13 NOTE — Progress Notes (Signed)
PROGRESS NOTE    Robert Hurst  VXB:[id number] DOB: May 28, 1932 DOA: 02/12/2020 PCP: [name], MD   Brief Narrative:  Per HPI: Robert Hurst is a 86 y.o. male with a history of Alzheimer's dementia, history of stroke COVID-19, history of DVT on Eliquis, hypothyroidism.  Patient brought to the hospital by EMS secondary to increased confusion over the past 2 days.  The patient normally walks and feeds himself.  Today, he is minimally responsive, nonverbal, and not feeding himself.  He is unable to provide history.  No palliating or provoking factors.  -Patient was admitted with acute metabolic encephalopathy secondary to UTI in the setting of dementia.  Assessment & Plan:   Principal Problem:   Acute metabolic encephalopathy Active Problems:   Dementia without behavioral disturbance (HCC)   Acute cystitis without hematuria   CKD (chronic kidney disease), stage III   Hypothyroidism   History of DVT (deep vein thrombosis)   Acute metabolic encephalopathy secondary to UTI in the setting of dementia -Continue ongoing treatment with ciprofloxacin and monitor urine cultures -Vancomycin discontinued as MRSA negative -Blood cultures with no growth noted thus far -Appears to be more alert this morning -Continue home dementia regimen with memantine and olanzapine  AKI on CKD IIIa -Back to baseline this morning with creatinine approximately 1.3 -Continue to monitor -Plan to decrease IV fluid and discontinue after 12 hours  Hypothyroidism -TSH 1.7 -Continue levothyroxine  History of DVT -Continue Eliquis  DVT prophylaxis: Eliquis Code Status: DNR Family Communication: Tried calling daughter that is listed with no response Disposition Plan: Continue treatment with current antibiotics and monitor mentation and urine cultures. Status is: Inpatient  Remains inpatient appropriate because:Altered mental status and IV treatments appropriate due to intensity of illness or inability to  take PO   Dispo: The patient is from: SNF              Anticipated d/c is to: SNF              Anticipated d/c date is: 1 day              Patient currently is not medically stable to d/c.   Consultants:   None  Procedures:   None  Antimicrobials:  Anti-infectives (From admission, onward)   Start     Dose/Rate Route Frequency Ordered Stop   02/13/20 2200  vancomycin (VANCOREADY) IVPB 500 mg/100 mL  Status:  Discontinued     500 mg 100 mL/hr over 60 Minutes Intravenous Every 24 hours 02/12/20 2141 02/13/20 0802   02/12/20 2130  vancomycin (VANCOCIN) IVPB 1000 mg/200 mL premix     1,000 mg 200 mL/hr over 60 Minutes Intravenous  Once 02/12/20 2105 02/12/20 2325   02/12/20 2045  ciprofloxacin (CIPRO) IVPB 400 mg     400 mg 200 mL/hr over 60 Minutes Intravenous Every 12 hours 02/12/20 2030     02/12/20 1745  ciprofloxacin (CIPRO) IVPB 400 mg     400 mg 200 mL/hr over 60 Minutes Intravenous  Once 02/12/20 1730 02/12/20 2028       Subjective: Patient seen and evaluated today and appears mostly somnolent. No acute concerns or events noted overnight.   Objective: Vitals:   02/13/20 0800 02/13/20 0900 02/13/20 1000 02/13/20 1125  BP: (!) 116/94 125/64 125/67   Pulse:      Resp: 20 20 16 16   Temp:    98.4 F (36.9 C)  TempSrc:    Oral  SpO2: 97%  96%  Weight:      Height:        Intake/Output Summary (Last 24 hours) at 02/13/2020 1208 Last data filed at 02/13/2020 0800 Gross per 24 hour  Intake 889.69 ml  Output 2075 ml  Net -1185.31 ml   Filed Weights   02/12/20 0927 02/12/20 2100  Weight: 51 kg 51 kg    Examination:  General exam: Appears calm and comfortable, somnolent Respiratory system: Clear to auscultation. Respiratory effort normal.  2 L nasal cannula oxygen Cardiovascular system: S1 & S2 heard, RRR. No JVD, murmurs, rubs, gallops or clicks. No pedal edema. Gastrointestinal system: Abdomen is nondistended, soft and nontender. No organomegaly or masses  felt. Normal bowel sounds heard. Central nervous system: Somnolent but arousable Extremities: No edema Skin: No rashes, lesions or ulcers Psychiatry: Difficult to assess    Data Reviewed: I have personally reviewed following labs and imaging studies  CBC: Recent Labs  Lab 02/12/20 1019 02/13/20 0548  WBC 7.4 6.9  NEUTROABS 4.7  --   HGB 17.3* 14.8  HCT 53.5* 46.7  MCV 91.9 92.1  PLT 156 120*   Basic Metabolic Panel: Recent Labs  Lab 02/12/20 1019 02/13/20 0548  NA 145 145  K 4.6 4.4  CL 106 110  CO2 28 27  GLUCOSE 115* 84  BUN 16 14  CREATININE 1.63* 1.38*  CALCIUM 9.9 8.6*   GFR: Estimated Creatinine Clearance: 28.2 mL/min (A) (by C-G formula based on SCr of 1.38 mg/dL (H)). Liver Function Tests: Recent Labs  Lab 02/12/20 1019  AST 19  ALT 11  ALKPHOS 84  BILITOT 0.8  PROT 7.9  ALBUMIN 3.8   No results for input(s): LIPASE, AMYLASE in the last 168 hours. No results for input(s): AMMONIA in the last 168 hours. Coagulation Profile: No results for input(s): INR, PROTIME in the last 168 hours. Cardiac Enzymes: No results for input(s): CKTOTAL, CKMB, CKMBINDEX, TROPONINI in the last 168 hours. BNP (last 3 results) No results for input(s): PROBNP in the last 8760 hours. HbA1C: No results for input(s): HGBA1C in the last 72 hours. CBG: No results for input(s): GLUCAP in the last 168 hours. Lipid Profile: No results for input(s): CHOL, HDL, LDLCALC, TRIG, CHOLHDL, LDLDIRECT in the last 72 hours. Thyroid Function Tests: Recent Labs    02/13/20 0548  TSH 1.742   Anemia Panel: No results for input(s): VITAMINB12, FOLATE, FERRITIN, TIBC, IRON, RETICCTPCT in the last 72 hours. Sepsis Labs: Recent Labs  Lab 02/12/20 1856 02/12/20 2157 02/13/20 0548  PROCALCITON <0.10  --  <0.10  LATICACIDVEN 5.3* 2.3*  --     Recent Results (from the past 240 hour(s))  SARS Coronavirus 2 by RT PCR (hospital order, performed in [facility] hospital lab)     Status:  None   Collection Time: 02/12/20  6:40 PM  Result Value Ref Range Status   SARS Coronavirus 2 NEGATIVE NEGATIVE Final    Comment: (NOTE) SARS-CoV-2 target nucleic acids are NOT DETECTED. The SARS-CoV-2 RNA is generally detectable in upper and lower respiratory specimens during the acute phase of infection. The lowest concentration of SARS-CoV-2 viral copies this assay can detect is 250 copies / mL. A negative result does not preclude SARS-CoV-2 infection and should not be used as the sole basis for treatment or other patient management decisions.  A negative result may occur with improper specimen collection / handling, submission of specimen other than nasopharyngeal swab, presence of viral mutation(s) within the areas targeted by this assay, and inadequate number  of viral copies (<250 copies / mL). A negative result must be combined with clinical observations, patient history, and epidemiological information. Fact Sheet for Patients:   BoilerBrush.com.cy Fact Sheet for Healthcare Providers: https://pope.com/ This test is not yet approved or cleared  by the [place] FDA and has been authorized for detection and/or diagnosis of SARS-CoV-2 by FDA under an Emergency Use Authorization (EUA).  This EUA will remain in effect (meaning this test can be used) for the duration of the COVID-19 declaration under Section 564(b)(1) of the Act, 21 U.S.C. section 360bbb-3(b)(1), unless the authorization is terminated or revoked sooner. Performed at [facility], [address], [place], [place] [address]   Culture, blood (routine x 2)     Status: None (Preliminary result)   Collection Time: 02/12/20  6:56 PM   Specimen: Left Antecubital; Blood  Result Value Ref Range Status   Specimen Description LEFT ANTECUBITAL  Final   Special Requests   Final    BOTTLES DRAWN AEROBIC AND ANAEROBIC Blood Culture adequate volume   Culture   Final    NO  GROWTH < 12 HOURS Performed at [facility], [address] [place], [place] [address]    Report Status PENDING  Incomplete  Culture, blood (routine x 2)     Status: None (Preliminary result)   Collection Time: 02/12/20  7:04 PM   Specimen: BLOOD LEFT HAND  Result Value Ref Range Status   Specimen Description BLOOD LEFT HAND  Final   Special Requests BLOOD RIGHT HAND BOTTLES DRAWN AEROBIC ONLY  Final   Culture   Final    NO GROWTH < 12 HOURS Performed at [facility], [address], [place], [place] [address]    Report Status PENDING  Incomplete  MRSA PCR Screening     Status: None   Collection Time: 02/12/20  8:30 PM   Specimen: Nasal Mucosa; Nasopharyngeal  Result Value Ref Range Status   MRSA by PCR NEGATIVE NEGATIVE Final    Comment:        The GeneXpert MRSA Assay (FDA approved for NASAL specimens only), is one component of a comprehensive MRSA colonization surveillance program. It is not intended to diagnose MRSA infection nor to guide or monitor treatment for MRSA infections. Performed at [facility], [address], [place], [place] [address]          Radiology Studies: CT Head Wo Contrast  Result Date: 02/12/2020 CLINICAL DATA:  Altered mental status with unclear cause EXAM: CT HEAD WITHOUT CONTRAST TECHNIQUE: Contiguous axial images were obtained from the base of the skull through the vertex without intravenous contrast. COMPARISON:  08/14/2019 FINDINGS: Brain: No evidence of acute infarction, hemorrhage, hydrocephalus, extra-axial collection or mass lesion/mass effect. Confluent low-density in the cerebral white matter attributed to chronic small vessel ischemia. Vascular: Prominent density of the right M1 segment on reformats is attributed to streak artifact. No unexpected atherosclerotic calcification Skull: Negative for fracture or bone lesion Sinuses/Orbits: Negative IMPRESSION: 1. No acute finding. 2. Extensive chronic small vessel ischemia in the cerebral  white matter. Electronically Signed   By: Marnee Spring M.D.   On: 02/12/2020 10:56   DG Chest Port 1 View  Result Date: 02/12/2020 CLINICAL DATA:  86 year old male with a history altered mental status EXAM: PORTABLE CHEST 1 VIEW COMPARISON:  08/08/2019 FINDINGS: Cardiomediastinal silhouette unchanged in size and contour. Similar appearance of asymmetric elevation of the right hemidiaphragm. Coarsened interstitial markings. No pneumothorax or pleural effusion. No confluent airspace disease. Osteopenia.  No  acute displaced fracture. IMPRESSION: Chronic lung changes without evidence of acute cardiopulmonary disease Electronically Signed   By: Corrie Mckusick D.O.   On: 02/12/2020 10:47        Scheduled Meds: . apixaban  2.5 mg Oral BID  . Chlorhexidine Gluconate Cloth  6 each Topical Daily  . levothyroxine  37.5 mcg Oral Q0600  . mouth rinse  15 mL Mouth Rinse BID  . memantine  10 mg Oral BID  . OLANZapine  2.5 mg Oral QHS  . polyethylene glycol  17 g Oral Daily   Continuous Infusions: . sodium chloride 100 mL/hr at 02/13/20 0300  . ciprofloxacin 400 mg (02/13/20 0854)     LOS: 1 day    Time spent: 35 minutes    Reeve Mallo D Manuella Ghazi, DO [facility]  If 7PM-7AM, please contact night-coverage [email or web address] 02/13/2020, 12:08 PM

## 2020-02-14 LAB — BASIC METABOLIC PANEL
Anion gap: 9 (ref 5–15)
BUN: 11 mg/dL (ref 8–23)
CO2: 22 mmol/L (ref 22–32)
Calcium: 8.4 mg/dL — ABNORMAL LOW (ref 8.9–10.3)
Chloride: 112 mmol/L — ABNORMAL HIGH (ref 98–111)
Creatinine, Ser: 1.35 mg/dL — ABNORMAL HIGH (ref 0.61–1.24)
GFR calc Af Amer: 55 mL/min — ABNORMAL LOW (ref >60)
GFR calc non Af Amer: 48 mL/min — ABNORMAL LOW (ref >60)
Glucose, Bld: 98 mg/dL (ref 70–99)
Potassium: 3.8 mmol/L (ref 3.5–5.1)
Sodium: 143 mmol/L (ref 135–145)

## 2020-02-14 LAB — CBC
HCT: 42.4 % (ref 39.0–52.0)
Hemoglobin: 13.6 g/dL (ref 13.0–17.0)
MCH: 29.4 pg (ref 26.0–34.0)
MCHC: 32.1 g/dL (ref 30.0–36.0)
MCV: 91.8 fL (ref 80.0–100.0)
Platelets: 131 10*3/uL — ABNORMAL LOW (ref 150–400)
RBC: 4.62 MIL/uL (ref 4.22–5.81)
RDW: 13.9 % (ref 11.5–15.5)
WBC: 7.1 10*3/uL (ref 4.0–10.5)
nRBC: 0 % (ref 0.0–0.2)

## 2020-02-14 LAB — PROCALCITONIN: Procalcitonin: <0.1 ng/mL

## 2020-02-14 LAB — LACTIC ACID, PLASMA: Lactic Acid, Venous: 1.2 mmol/L (ref 0.5–1.9)

## 2020-02-14 MED ORDER — FOSFOMYCIN TROMETHAMINE 3 G PO PACK: 3.0000 g | PACK | Freq: Once | ORAL | Status: DC

## 2020-02-14 NOTE — TOC Initial Note (Signed)
Transition of Care [facility]) - Initial/Assessment Note    Patient Details  Name: Robert Hurst MRN: [id number] Date of Birth: 12/30/1931  [facility] CM/SW Contact:    Salome Arnt, [place] Phone Number: 02/14/2020, 10:52 AM  Clinical Narrative:  Pt from [facility] and will d/c today. LCSW spoke with Jackelyn Poling at facility and pt's daughter, [name]. Pt has alzheimer's dementia and is disoriented x4. Per Jackelyn Poling, pt has been resident at [facility] for 4 years. Okay to return. LCSW sent discharge information to SNF via Hub. [name] requests [facility]. RN notified of above.                  Expected Discharge Plan: Skilled Nursing Facility Barriers to Discharge: No Barriers Identified, Barriers Resolved   Patient Goals and CMS Choice Patient states their goals for this hospitalization and ongoing recovery are:: return to [facility]      Expected Discharge Plan and Services Expected Discharge Plan: [place] In-house Referral: Clinical Social Work     Living arrangements for the past 2 months: [place] Expected Discharge Date: 02/14/20                                    Prior Living Arrangements/Services Living arrangements for the past 2 months: [place] Lives with:: Facility Resident Patient language and need for interpreter reviewed:: Yes Do you feel safe going back to the place where you live?: Yes      Need for Family Participation in Patient Care: Yes (Comment) Care giver support system in place?: Yes (comment)(SNF resident)   Criminal Activity/Legal Involvement Pertinent to Current Situation/Hospitalization: No - Comment as needed  Activities of Daily Living Home Assistive Devices/Equipment: Wheelchair ADL Screening (condition at time of admission) Patient's cognitive ability adequate to safely complete daily activities?: No Is the patient deaf or have difficulty hearing?: No Does the patient have  difficulty seeing, even when wearing glasses/contacts?: No Does the patient have difficulty concentrating, remembering, or making decisions?: Yes Patient able to express need for assistance with ADLs?: No Does the patient have difficulty dressing or bathing?: Yes Independently performs ADLs?: No Communication: Independent Dressing (OT): Dependent Is this a change from baseline?: Pre-admission baseline Grooming: Dependent Is this a change from baseline?: Pre-admission baseline Feeding: Needs assistance Is this a change from baseline?: Pre-admission baseline Bathing: Dependent Is this a change from baseline?: Pre-admission baseline Toileting: Dependent Is this a change from baseline?: Pre-admission baseline In/Out Bed: Dependent Is this a change from baseline?: Pre-admission baseline Walks in Home: Dependent Is this a change from baseline?: Pre-admission baseline Does the patient have difficulty walking or climbing stairs?: Yes Weakness of Legs: Both Weakness of Arms/Hands: Both  Permission Sought/Granted Permission sought to share information with : Facility Art therapist granted to share information with : Yes, Verbal Permission Granted     Permission granted to share info w AGENCY: [place] granted to share info w Relationship: SNF     Emotional Assessment Appearance:: Appears stated age Attitude/Demeanor/Rapport: Unable to Assess Affect (typically observed): Unable to Assess Orientation: : (disoriented x4) Alcohol / Substance Use: Not Applicable    Admission diagnosis:  Urinary tract infection without hematuria, site unspecified [N39.0] Altered mental status, unspecified altered mental status type [Y86.57] Acute metabolic encephalopathy [Q46.96] Patient Active Problem List   Diagnosis Date Noted  . CKD (chronic kidney disease), stage III 02/12/2020  .  Hypothyroidism 02/12/2020  . History of DVT (deep vein thrombosis)   . History  of COVID-19   . Acute cystitis without hematuria   . Goals of care, counseling/discussion   . Palliative care encounter   . Acute metabolic encephalopathy 08/11/2019  . Syncope and collapse 08/11/2019  . Syncope   . Dementia without behavioral disturbance (HCC) 08/08/2019  . Acute renal failure superimposed on stage 3b chronic kidney disease (HCC) 08/08/2019  . Lactic acidosis 08/08/2019  . Incontinence 08/08/2019   PCP:  [name], MD Pharmacy:   [address] [place], [place] - [phone] [place] [address] Phone: [phone] Fax: [phone]     Social Determinants of Health (SDOH) Interventions    Readmission Risk Interventions No flowsheet data found.

## 2020-02-14 NOTE — Discharge Summary (Signed)
Physician Discharge Summary  Robert Hurst WPY:[id number] DOB: 16-Feb-1932 DOA: 02/12/2020  PCP: Neale Burly, MD  Admit date: 02/12/2020  Discharge date: 02/14/2020  Admitted From:SNF  Disposition:  SNF  Recommendations for Outpatient Follow-up:  1. Follow up with PCP in 1-2 weeks 2. Continue home medications as prior 3. Patient has been given a dose of fosfomycin prior to discharge and therefore, has completed course of antibiotics.  Mentation appears to be back to baseline.  Home Health: None  Equipment/Devices: None  Discharge Condition: Stable  CODE STATUS: DNR  Diet recommendation: Heart Healthy  Brief/Interim Summary: Per HPI: Robert Perkinsis a 86 y.o.malewith a history of Alzheimer's dementia, history of stroke COVID-19, history of DVT on Eliquis, hypothyroidism. Patient brought to the hospital by EMS secondary to increased confusion over the past 2 days. The patient normally walks and feeds himself. Today, he is minimally responsive, nonverbal, and not feeding himself. He is unable to provide history. No palliating or provoking factors.  -Patient was admitted with acute metabolic encephalopathy secondary to UTI in the setting of dementia.  He was treated with ciprofloxacin for 2 days during his inpatient stay with dramatic improvement in his mentation noted this morning.  His AKI has resolved and he is back to baseline creatinine is near 1.3 and has had otherwise no acute overnight events.  He did require Foley catheter placement for some urinary retention and this has been removed with proper voiding noted.  His urine cultures have not demonstrated any significant growth and they have been recultured for better evaluation, but he will be given fosfomycin to complete course of treatment at this time.  As he is clinically stable, he may return to his SNF at this time.  Discharge Diagnoses:  Principal Problem:   Acute metabolic encephalopathy Active Problems:   Dementia  without behavioral disturbance (HCC)   Acute cystitis without hematuria   CKD (chronic kidney disease), stage III   Hypothyroidism   History of DVT (deep vein thrombosis)  Principal discharge diagnosis: Acute metabolic encephalopathy secondary to UTI.  Discharge Instructions  Discharge Instructions    Diet - low sodium heart healthy   Complete by: As directed    Increase activity slowly   Complete by: As directed      Allergies as of 02/14/2020      Reactions   Penicillins    Has patient had a PCN reaction causing immediate rash, facial/tongue/throat swelling, SOB or lightheadedness with hypotension: Unknown Has patient had a PCN reaction causing severe rash involving mucus membranes or skin necrosis: Unknown Has patient had a PCN reaction that required hospitalization: Unknown Has patient had a PCN reaction occurring within the last 10 years: Unknown If all of the above answers are "NO", then may proceed with Cephalosporin use.      Medication List    TAKE these medications   apixaban 2.5 MG Tabs tablet Commonly known as: ELIQUIS Take 1 tablet (2.5 mg total) by mouth 2 (two) times daily.   levothyroxine 75 MCG tablet Commonly known as: SYNTHROID Take 0.5 tablets (37.5 mcg total) by mouth daily at 6 (six) AM.   memantine 10 MG tablet Commonly known as: NAMENDA Take 10 mg by mouth 2 (two) times daily.   OLANZapine 2.5 MG tablet Commonly known as: ZYPREXA Take 2.5 mg by mouth at bedtime.   polyethylene glycol 17 g packet Commonly known as: MIRALAX / GLYCOLAX Take 17 g by mouth daily.   senna 8.6 MG tablet Commonly known as: [name]  Take 1 tablet by mouth daily.      Follow-up Information    [name], MD Follow up in 1 week(s).   Specialty: Internal Medicine Contact information: [address] [place] [address] [phone]          Allergies  Allergen Reactions  . Penicillins     Has patient had a PCN reaction causing immediate rash,  facial/tongue/throat swelling, SOB or lightheadedness with hypotension: Unknown Has patient had a PCN reaction causing severe rash involving mucus membranes or skin necrosis: Unknown Has patient had a PCN reaction that required hospitalization: Unknown Has patient had a PCN reaction occurring within the last 10 years: Unknown If all of the above answers are "NO", then may proceed with Cephalosporin use.     Consultations:  None   Procedures/Studies: CT Head Wo Contrast  Result Date: 02/12/2020 CLINICAL DATA:  Altered mental status with unclear cause EXAM: CT HEAD WITHOUT CONTRAST TECHNIQUE: Contiguous axial images were obtained from the base of the skull through the vertex without intravenous contrast. COMPARISON:  08/14/2019 FINDINGS: Brain: No evidence of acute infarction, hemorrhage, hydrocephalus, extra-axial collection or mass lesion/mass effect. Confluent low-density in the cerebral white matter attributed to chronic small vessel ischemia. Vascular: Prominent density of the right M1 segment on reformats is attributed to streak artifact. No unexpected atherosclerotic calcification Skull: Negative for fracture or bone lesion Sinuses/Orbits: Negative IMPRESSION: 1. No acute finding. 2. Extensive chronic small vessel ischemia in the cerebral white matter. Electronically Signed   By: Marnee Spring M.D.   On: 02/12/2020 10:56   DG Chest Port 1 View  Result Date: 02/12/2020 CLINICAL DATA:  86 year old male with a history altered mental status EXAM: PORTABLE CHEST 1 VIEW COMPARISON:  08/08/2019 FINDINGS: Cardiomediastinal silhouette unchanged in size and contour. Similar appearance of asymmetric elevation of the right hemidiaphragm. Coarsened interstitial markings. No pneumothorax or pleural effusion. No confluent airspace disease. Osteopenia.  No acute displaced fracture. IMPRESSION: Chronic lung changes without evidence of acute cardiopulmonary disease Electronically Signed   By: Gilmer Mor  D.O.   On: 02/12/2020 10:47     Discharge Exam: Vitals:   02/14/20 0500 02/14/20 0808  BP: (!) 143/92   Pulse:    Resp: 17   Temp:  99 F (37.2 C)  SpO2:     Vitals:   02/14/20 0300 02/14/20 0400 02/14/20 0500 02/14/20 0808  BP: (!) 129/92 (!) 175/78 (!) 143/92   Pulse:      Resp: 13 19 17    Temp:  98.5 F (36.9 C)  99 F (37.2 C)  TempSrc:  Oral  Oral  SpO2:      Weight:   54.8 kg   Height:        General: Pt is alert, awake, not in acute distress Cardiovascular: RRR, S1/S2 +, no rubs, no gallops Respiratory: CTA bilaterally, no wheezing, no rhonchi Abdominal: Soft, NT, ND, bowel sounds + Extremities: no edema, no cyanosis    The results of significant diagnostics from this hospitalization (including imaging, microbiology, ancillary and laboratory) are listed below for reference.     Microbiology: Recent Results (from the past 240 hour(s))  Urine Culture     Status: None (Preliminary result)   Collection Time: 02/12/20  3:57 PM   Specimen: Urine, Clean Catch  Result Value Ref Range Status   Specimen Description   Final    URINE, CLEAN CATCH Performed at [facility], [address] [place], [place]  Special Requests   Final    NONE Performed at [facility], [address] [place], [place] [address]    Culture   Final    CULTURE REINCUBATED FOR BETTER GROWTH Performed at [facility], [address] [place], [place] [address]    Report Status PENDING  Incomplete  SARS Coronavirus 2 by RT PCR (hospital order, performed in [facility] hospital lab)     Status: None   Collection Time: 02/12/20  6:40 PM  Result Value Ref Range Status   SARS Coronavirus 2 NEGATIVE NEGATIVE Final    Comment: (NOTE) SARS-CoV-2 target nucleic acids are NOT DETECTED. The SARS-CoV-2 RNA is generally detectable in upper and lower respiratory specimens during the acute phase of infection. The lowest concentration of SARS-CoV-2 viral copies this assay can  detect is 250 copies / mL. A negative result does not preclude SARS-CoV-2 infection and should not be used as the sole basis for treatment or other patient management decisions.  A negative result may occur with improper specimen collection / handling, submission of specimen other than nasopharyngeal swab, presence of viral mutation(s) within the areas targeted by this assay, and inadequate number of viral copies (<250 copies / mL). A negative result must be combined with clinical observations, patient history, and epidemiological information. Fact Sheet for Patients:   BoilerBrush.com.cyhttps://www.fda.gov/media/136312/download Fact Sheet for Healthcare Providers: https://pope.com/https://www.fda.gov/media/136313/download This test is not yet approved or cleared  by the [place] FDA and has been authorized for detection and/or diagnosis of SARS-CoV-2 by FDA under an Emergency Use Authorization (EUA).  This EUA will remain in effect (meaning this test can be used) for the duration of the COVID-19 declaration under Section 564(b)(1) of the Act, 21 U.S.C. section 360bbb-3(b)(1), unless the authorization is terminated or revoked sooner. Performed at [facility], [address] [place], [place] [address]   Culture, blood (routine x 2)     Status: None (Preliminary result)   Collection Time: 02/12/20  6:56 PM   Specimen: Right Antecubital; Blood  Result Value Ref Range Status   Specimen Description RIGHT ANTECUBITAL  Final   Special Requests   Final    BOTTLES DRAWN AEROBIC AND ANAEROBIC Blood Culture adequate volume   Culture   Final    NO GROWTH 2 DAYS Performed at [facility], [address] [place], [place] [address]    Report Status PENDING  Incomplete  Culture, blood (routine x 2)     Status: None (Preliminary result)   Collection Time: 02/12/20  7:04 PM   Specimen: BLOOD LEFT HAND  Result Value Ref Range Status   Specimen Description BLOOD LEFT HAND  Final   Special Requests BLOOD RIGHT HAND BOTTLES  DRAWN AEROBIC ONLY  Final   Culture   Final    NO GROWTH 2 DAYS Performed at [facility], [address] [place], [place] [address]    Report Status PENDING  Incomplete  MRSA PCR Screening     Status: None   Collection Time: 02/12/20  8:30 PM   Specimen: Nasal Mucosa; Nasopharyngeal  Result Value Ref Range Status   MRSA by PCR NEGATIVE NEGATIVE Final    Comment:        The GeneXpert MRSA Assay (FDA approved for NASAL specimens only), is one component of a comprehensive MRSA colonization surveillance program. It is not intended to diagnose MRSA infection nor to guide or monitor treatment for MRSA infections. Performed at [facility], [address] [place], [place] [address]  Labs: BNP (last 3 results) No results for input(s): BNP in the last 8760 hours. Basic Metabolic Panel: Recent Labs  Lab 02/12/20 1019 02/13/20 0548 02/14/20 0518  NA 145 145 143  K 4.6 4.4 3.8  CL 106 110 112*  CO2 28 27 22   GLUCOSE 115* 84 98  BUN 16 14 11   CREATININE 1.63* 1.38* 1.35*  CALCIUM 9.9 8.6* 8.4*   Liver Function Tests: Recent Labs  Lab 02/12/20 1019  AST 19  ALT 11  ALKPHOS 84  BILITOT 0.8  PROT 7.9  ALBUMIN 3.8   No results for input(s): LIPASE, AMYLASE in the last 168 hours. No results for input(s): AMMONIA in the last 168 hours. CBC: Recent Labs  Lab 02/12/20 1019 02/13/20 0548 02/14/20 0518  WBC 7.4 6.9 7.1  NEUTROABS 4.7  --   --   HGB 17.3* 14.8 13.6  HCT 53.5* 46.7 42.4  MCV 91.9 92.1 91.8  PLT 156 120* 131*   Cardiac Enzymes: No results for input(s): CKTOTAL, CKMB, CKMBINDEX, TROPONINI in the last 168 hours. BNP: Invalid input(s): POCBNP CBG: No results for input(s): GLUCAP in the last 168 hours. D-Dimer No results for input(s): DDIMER in the last 72 hours. Hgb A1c No results for input(s): HGBA1C in the last 72 hours. Lipid Profile No results for input(s): CHOL, HDL, LDLCALC, TRIG, CHOLHDL, LDLDIRECT in the last 72 hours. Thyroid function  studies Recent Labs    02/13/20 0548  TSH 1.742   Anemia work up No results for input(s): VITAMINB12, FOLATE, FERRITIN, TIBC, IRON, RETICCTPCT in the last 72 hours. Urinalysis    Component Value Date/Time   COLORURINE YELLOW 02/12/2020 1545   APPEARANCEUR HAZY (A) 02/12/2020 1545   LABSPEC 1.012 02/12/2020 1545   PHURINE 5.0 02/12/2020 1545   GLUCOSEU NEGATIVE 02/12/2020 1545   HGBUR MODERATE (A) 02/12/2020 1545   BILIRUBINUR NEGATIVE 02/12/2020 1545   KETONESUR NEGATIVE 02/12/2020 1545   PROTEINUR 30 (A) 02/12/2020 1545   NITRITE NEGATIVE 02/12/2020 1545   LEUKOCYTESUR MODERATE (A) 02/12/2020 1545   Sepsis Labs Invalid input(s): PROCALCITONIN,  WBC,  LACTICIDVEN Microbiology Recent Results (from the past 240 hour(s))  Urine Culture     Status: None (Preliminary result)   Collection Time: 02/12/20  3:57 PM   Specimen: Urine, Clean Catch  Result Value Ref Range Status   Specimen Description   Final    URINE, CLEAN CATCH Performed at [facility], [address], [place], [address] [place]    Special Requests   Final    NONE Performed at [facility], [address], [place], [address] [place]    Culture   Final    CULTURE REINCUBATED FOR BETTER GROWTH Performed at [facility], [address], [place], [address] [place]    Report Status PENDING  Incomplete  SARS Coronavirus 2 by RT PCR (hospital order, performed in [facility] hospital lab)     Status: None   Collection Time: 02/12/20  6:40 PM  Result Value Ref Range Status   SARS Coronavirus 2 NEGATIVE NEGATIVE Final    Comment: (NOTE) SARS-CoV-2 target nucleic acids are NOT DETECTED. The SARS-CoV-2 RNA is generally detectable in upper and lower respiratory specimens during the acute phase of infection. The lowest concentration of SARS-CoV-2 viral copies this assay can detect is 250 copies / mL. A negative result does not preclude SARS-CoV-2 infection and should not be used as the sole basis for treatment or  other patient management decisions.  A negative result may  occur with improper specimen collection / handling, submission of specimen other than nasopharyngeal swab, presence of viral mutation(s) within the areas targeted by this assay, and inadequate number of viral copies (<250 copies / mL). A negative result must be combined with clinical observations, patient history, and epidemiological information. Fact Sheet for Patients:   BoilerBrush.com.cy Fact Sheet for Healthcare Providers: https://pope.com/ This test is not yet approved or cleared  by the [place] FDA and has been authorized for detection and/or diagnosis of SARS-CoV-2 by FDA under an Emergency Use Authorization (EUA).  This EUA will remain in effect (meaning this test can be used) for the duration of the COVID-19 declaration under Section 564(b)(1) of the Act, 21 U.S.C. section 360bbb-3(b)(1), unless the authorization is terminated or revoked sooner. Performed at [facility], [address], [place], [place] [address]   Culture, blood (routine x 2)     Status: None (Preliminary result)   Collection Time: 02/12/20  6:56 PM   Specimen: Right Antecubital; Blood  Result Value Ref Range Status   Specimen Description RIGHT ANTECUBITAL  Final   Special Requests   Final    BOTTLES DRAWN AEROBIC AND ANAEROBIC Blood Culture adequate volume   Culture   Final    NO GROWTH 2 DAYS Performed at [facility], [address] [place], [place] [address]    Report Status PENDING  Incomplete  Culture, blood (routine x 2)     Status: None (Preliminary result)   Collection Time: 02/12/20  7:04 PM   Specimen: BLOOD LEFT HAND  Result Value Ref Range Status   Specimen Description BLOOD LEFT HAND  Final   Special Requests BLOOD RIGHT HAND BOTTLES DRAWN AEROBIC ONLY  Final   Culture   Final    NO GROWTH 2 DAYS Performed at [facility], [address] [place], [place] [address]    Report  Status PENDING  Incomplete  MRSA PCR Screening     Status: None   Collection Time: 02/12/20  8:30 PM   Specimen: Nasal Mucosa; Nasopharyngeal  Result Value Ref Range Status   MRSA by PCR NEGATIVE NEGATIVE Final    Comment:        The GeneXpert MRSA Assay (FDA approved for NASAL specimens only), is one component of a comprehensive MRSA colonization surveillance program. It is not intended to diagnose MRSA infection nor to guide or monitor treatment for MRSA infections. Performed at [facility], [address] [place], [place] [address]      Time coordinating discharge: 35 minutes  SIGNED:   Erick Blinks, DO [facility] 02/14/2020, 10:04 AM  If 7PM-7AM, please contact night-coverage [email or web address]

## 2020-02-14 NOTE — Plan of Care (Signed)
Report called to [facility]. [facility] transported pt back. VSS

## 2020-02-14 NOTE — NC FL2 (Signed)
[place] LEVEL OF CARE SCREENING TOOL     IDENTIFICATION  Patient Name: Arlando Leisinger Birthdate: October 27, 1931 Sex: male Admission Date (Current Location): 02/12/2020  [place] and [place] Number:  [place] [facility] [id number] [place] and Address:  [facility] [address], [place]      Provider Number: [id number]  Attending Physician Name and Address:  Rodena Goldmann, DO  Relative Name and Phone Number:       Current Level of Care: Hospital Recommended Level of Care: Nursing Facility Prior Approval Number:    Date Approved/Denied:   PASRR Number:    Discharge Plan: SNF    Current Diagnoses: Patient Active Problem List   Diagnosis Date Noted  . CKD (chronic kidney disease), stage III 02/12/2020  . Hypothyroidism 02/12/2020  . History of DVT (deep vein thrombosis)   . History of COVID-19   . Acute cystitis without hematuria   . Goals of care, counseling/discussion   . Palliative care encounter   . Acute metabolic encephalopathy 82/42/3536  . Syncope and collapse 08/11/2019  . Syncope   . Dementia without behavioral disturbance ([place]) 08/08/2019  . Acute renal failure superimposed on stage 3b chronic kidney disease ([place]) 08/08/2019  . Lactic acidosis 08/08/2019  . Incontinence 08/08/2019    Orientation RESPIRATION BLADDER Height & Weight     (disoriented x4)  Normal Incontinent Weight: 120 lb 13 oz (54.8 kg) Height:  5\' 4"  (162.6 cm)  BEHAVIORAL SYMPTOMS/MOOD NEUROLOGICAL BOWEL NUTRITION STATUS      Incontinent Diet(Low sodium heart healthy (see d/c summary))  AMBULATORY STATUS COMMUNICATION OF NEEDS Skin   Extensive Assist (Does not communicate needs.) Normal                       Personal Care Assistance Level of Assistance  Bathing, Dressing, Feeding Bathing Assistance: Maximum assistance Feeding assistance: Maximum assistance Dressing Assistance: Maximum assistance     Functional Limitations Info    Sight Info:  Adequate        SPECIAL CARE FACTORS FREQUENCY                       Contractures      Additional Factors Info  Code Status, Allergies Code Status Info: DNR Allergies Info: Penicillins           Current Medications (02/14/2020):  This is the current hospital active medication list Current Facility-Administered Medications  Medication Dose Route Frequency Provider Last Rate Last Admin  . 0.9 %  sodium chloride infusion   Intravenous Continuous Truett Mainland, DO 100 mL/hr at 02/14/20 0033 New Bag at 02/14/20 0033  . apixaban (ELIQUIS) tablet 2.5 mg  2.5 mg Oral BID Truett Mainland, DO   2.5 mg at 02/13/20 2116  . Chlorhexidine Gluconate Cloth 2 % PADS 6 each  6 each Topical Daily Truett Mainland, DO   6 each at 02/13/20 [phone]  . fosfomycin (MONUROL) packet 3 g  3 g Oral Once Manuella Ghazi, Pratik D, DO      . levothyroxine (SYNTHROID) tablet 37.5 mcg  37.5 mcg Oral Q0600 Truett Mainland, DO   37.5 mcg at 02/14/20 0541  . MEDLINE mouth rinse  15 mL Mouth Rinse BID Truett Mainland, DO   15 mL at 02/13/20 2116  . memantine (NAMENDA) tablet 10 mg  10 mg Oral BID Truett Mainland, DO   10 mg at 02/13/20 2116  . OLANZapine (ZYPREXA) tablet  2.5 mg  2.5 mg Oral QHS Levie Heritage, DO   2.5 mg at 02/13/20 2116  . polyethylene glycol (MIRALAX / GLYCOLAX) packet 17 g  17 g Oral Daily Levie Heritage, DO   17 g at 02/13/20 0856  . senna (SENOKOT) tablet 8.6 mg  1 tablet Oral QHS PRN Levie [place], DO         Discharge Medications: Please see discharge summary for a list of discharge medications.  Relevant Imaging Results:  Relevant Lab Results:   Additional Information High fall risk  Karn Cassis, LCSW

## 2020-02-14 NOTE — TOC Transition Note (Signed)
Transition of Care [facility]) - CM/SW Discharge Note   Patient Details  Name: Robert Hurst MRN: [id number] Date of Birth: 02-14-1932  [facility] CM/SW Contact:  Karn Cassis, LCSW Phone Number: 02/14/2020, 10:54 AM   Clinical Narrative:   See previous note. Pt d/c today back to [facility].     Final next level of care: Skilled Nursing Facility Barriers to Discharge: No Barriers Identified   Patient Goals and CMS Choice Patient states their goals for this hospitalization and ongoing recovery are:: return to [facility]      Discharge Placement              Patient chooses bed at: Other - please specify in the comment section below:(return to [facility]) Patient to be transferred to facility by: [facility] Name of family member notified: [name]- daughter Patient and family notified of of transfer: 02/14/20  Discharge Plan and Services In-house Referral: Clinical Social Work                                   Social Determinants of Health (SDOH) Interventions     Readmission Risk Interventions No flowsheet data found.

## 2020-02-15 LAB — URINE CULTURE: Culture: >=100000 — AB

## 2020-02-17 LAB — CULTURE, BLOOD (ROUTINE X 2)
Culture: NO GROWTH
Culture: NO GROWTH
Special Requests: ADEQUATE

## 2020-02-22 IMAGING — CT CT ABD-PELV W/ CM
2 of 4 series · 16 of 46 positions shown, 18 images · IV contrast (Isovue)
Comparison: None.

CLINICAL DATA: Abdominal distention

EXAM:
CT ABDOMEN AND PELVIS WITH CONTRAST
TECHNIQUE: Multidetector CT imaging of the abdomen and pelvis was performed
using the standard protocol following bolus administration of
intravenous contrast.
CONTRAST:  75mL OMNIPAQUE IOHEXOL 300 MG/ML  SOLN

[Series 2: axial st · axial · 0.67mm/px · z∈[-77,+328]mm · 13 of 91 slices shown, 15 images]
[im 5/91  soft-tissue]
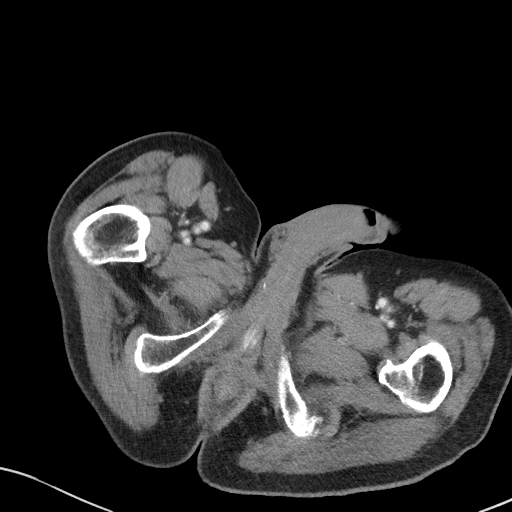
[im 5/91  bone]
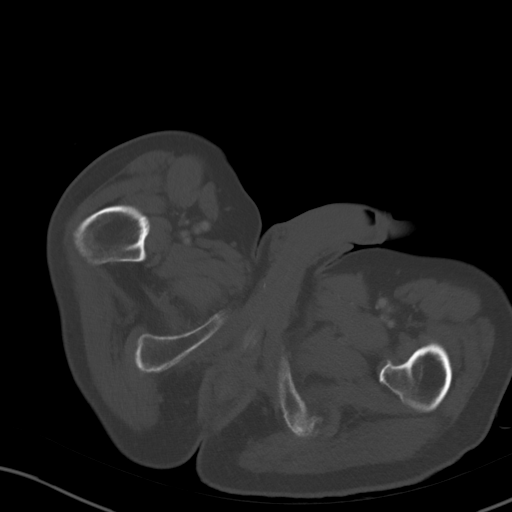
[im 13/91  soft-tissue]
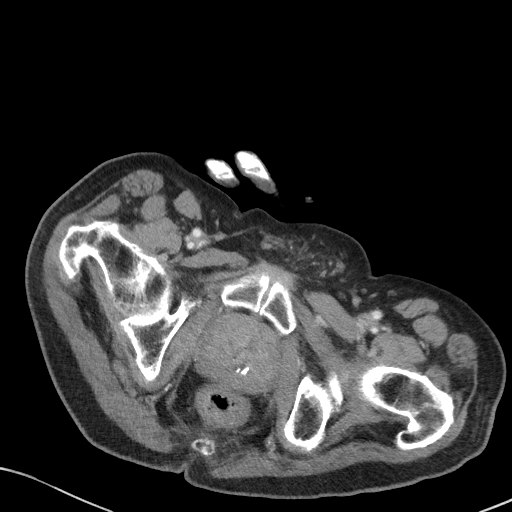
[im 21/91  soft-tissue]
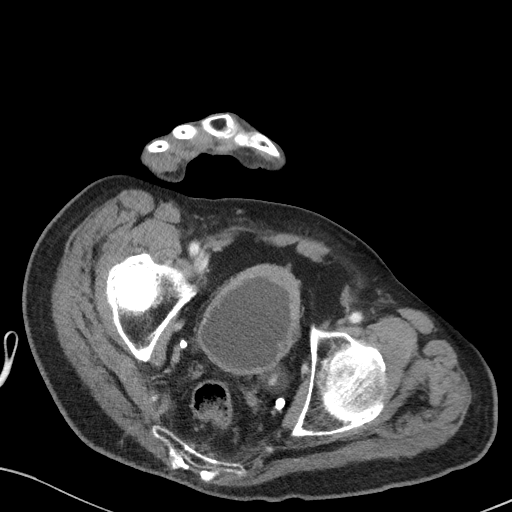
[im 25/91  soft-tissue]
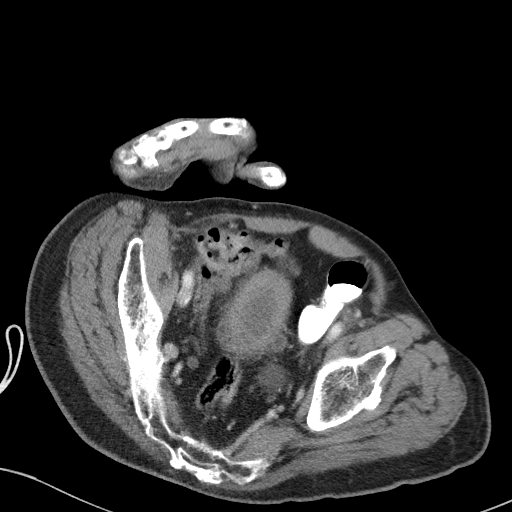
[im 33/91  soft-tissue]
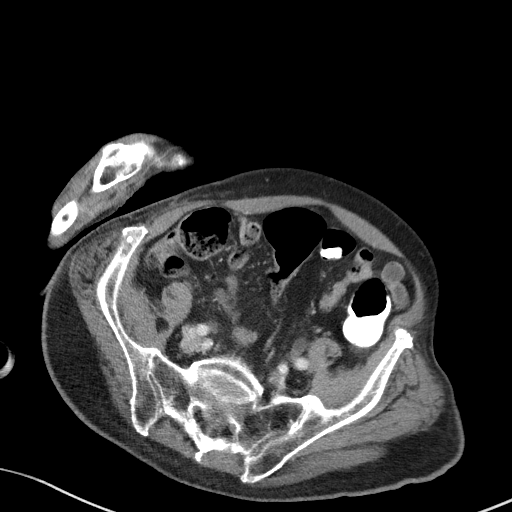
[im 37/91  soft-tissue]
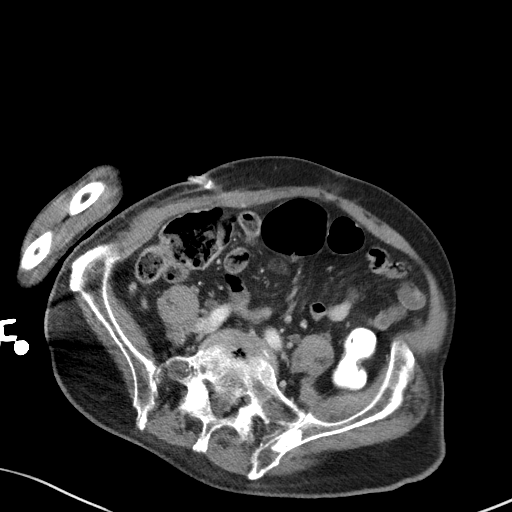
[im 46/91  soft-tissue]
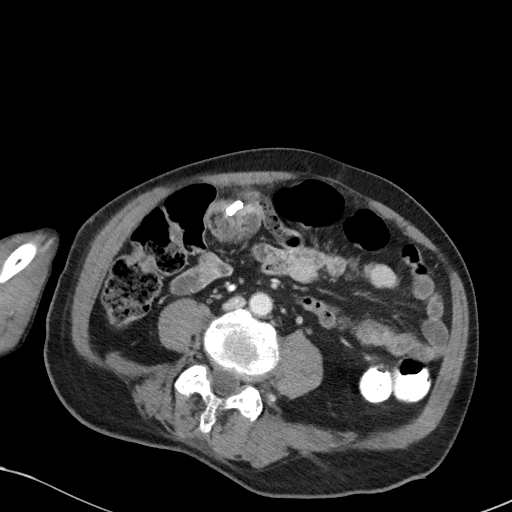
[im 54/91  soft-tissue]
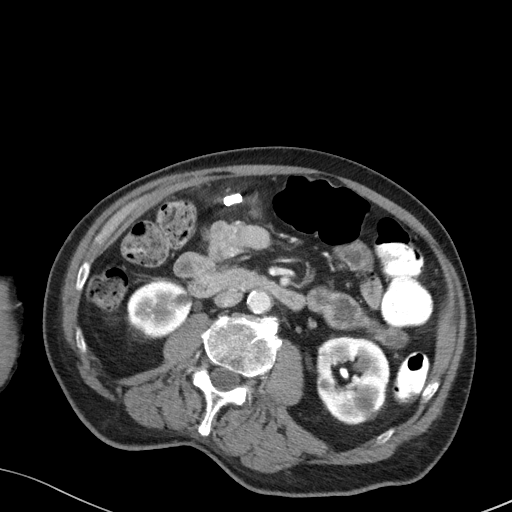
[im 58/91  soft-tissue]
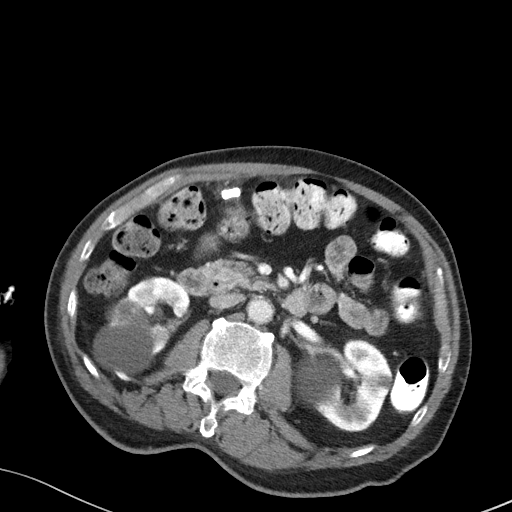
[im 58/91  bone]
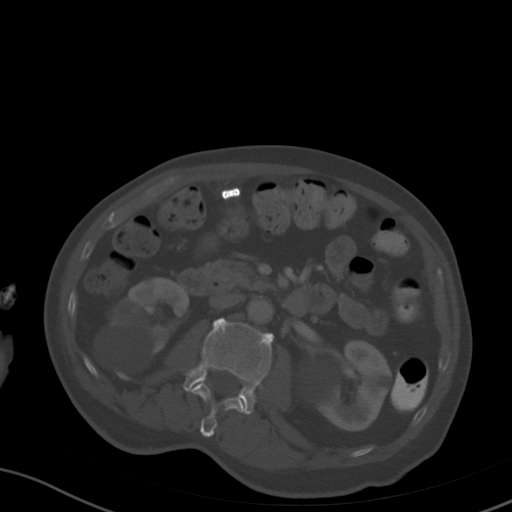
[im 66/91  soft-tissue]
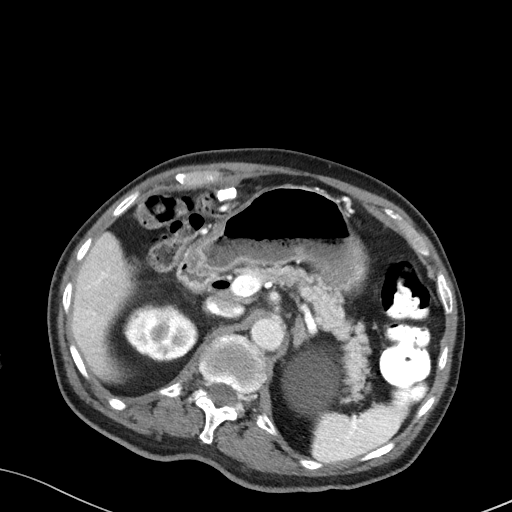
[im 70/91  soft-tissue]
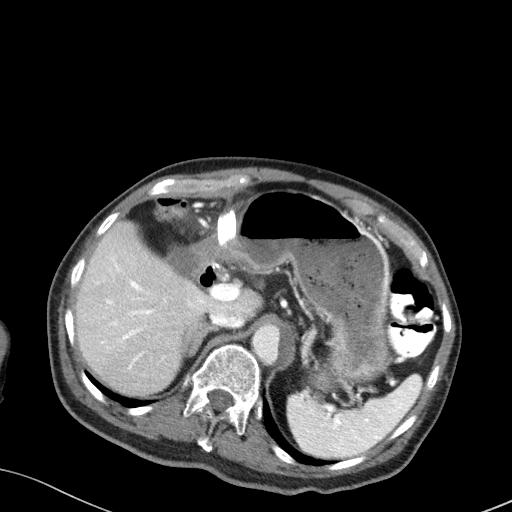
[im 78/91  soft-tissue]
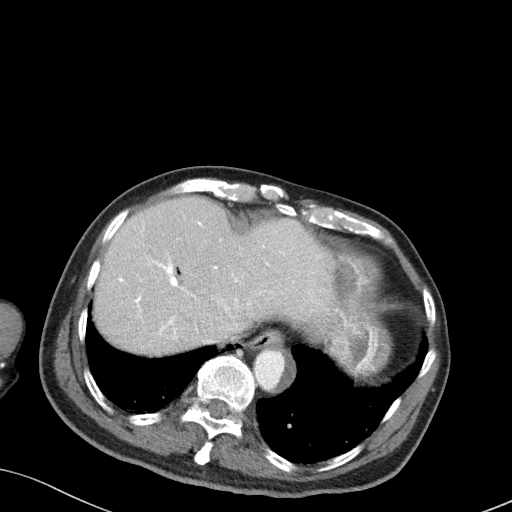
[im 86/91  soft-tissue]
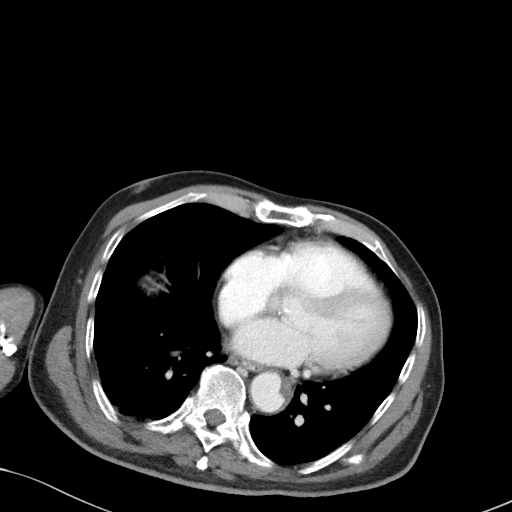

[Series 5: coronal st · coronal · 0.79mm/px · 3 of 80 slices shown]
[im 27/80  soft-tissue]
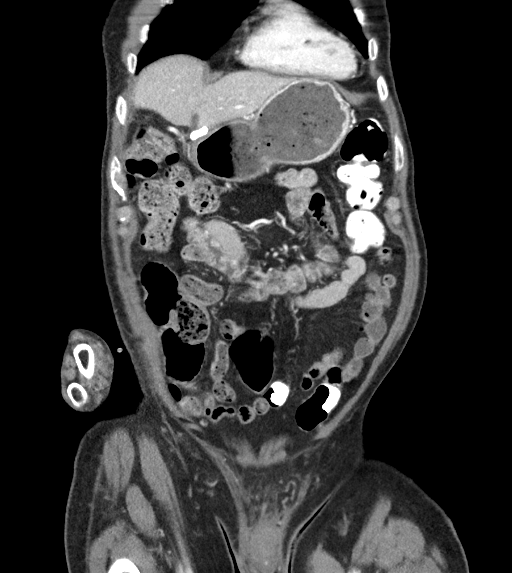
[im 36/80  soft-tissue]
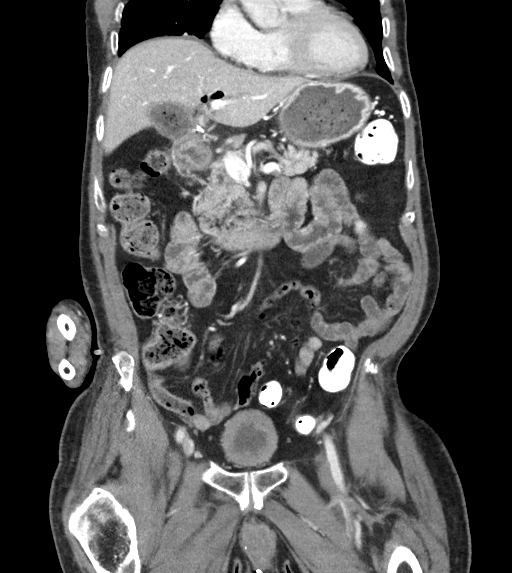
[im 44/80  soft-tissue]
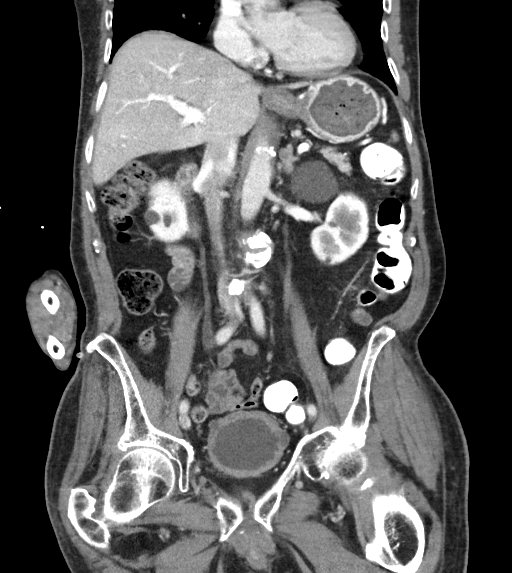

[16 of 46 positions shown; findings below may reference images not displayed]

FINDINGS: Lower chest: Mildly enlarged cardiac silhouette. Atelectasis noted
at the lung bases. No effusion or pneumothorax.

Hepatobiliary: Pneumobilia status post recent intervention. Status
post cholecystectomy. A percutaneous drain entering from right lower
quadrant is noted with tip medial and inferior to the gallbladder
fossa. No space-occupying mass of the liver.

Pancreas: Mild pancreatic atrophy without mass or ductal dilatation.

Spleen: Normal spleen.

Adrenals/Urinary Tract: Tiny hypodense nodule in the left adrenal
apex measuring 5 mm too small to further characterize but likely to
represent a tiny adenoma. Bilateral renal cysts are identified with
nonobstructing bilateral renal calculi similar in appearance to
prior. The calculus in the upper pole the right kidney staghorn in
appearance measuring up to 2.2 cm. The calculus in the left kidney
measures up to 0.9 cm. Left lower pole renal calculus also noted
measuring up to 1 cm. No hydroureteronephrosis. Thick-walled urinary
bladder likely due to underdistention given relative normal
appearance on recent comparison. Small left-sided bladder
diverticula.

Stomach/Bowel: Increased colonic stool burden. Gastric distention
with ingested fluid and food. No small bowel dilatation or
obstruction. Normal appendix.

Vascular/Lymphatic: Mild aortoiliac atherosclerosis. No
lymphadenopathy.

Reproductive: Enlarged prostate measuring up to 5.7 cm transverse
with central and peripheral zone calcifications.

Other: No free air nor free fluid. Postop change at the level of the
umbilicus.

Musculoskeletal: No acute osseous abnormality. Degenerative change
of the lumbar spine. Bone islands of the left iliac.
IMPRESSION: Status post cholecystectomy with adjacent drain in place.
Pneumobilia from recent instrumentation for choledocholithiasis.

Bilateral renal cysts and renal calculi as before. Small left-sided
bladder diverticulum. Relatively thickened anterior bladder wall
some which is likely due to underdistention.

Increased colonic stool burden. No bowel obstruction or
inflammation.

Enlarged prostate.

## 2020-04-29 ENCOUNTER — Encounter (HOSPITAL_COMMUNITY): Payer: Self-pay | Admitting: Emergency Medicine

## 2020-04-29 ENCOUNTER — Inpatient Hospital Stay (HOSPITAL_COMMUNITY)
Admission: EM | Admit: 2020-04-29 | Discharge: 2020-05-04 | DRG: 871 | Disposition: A | Discharge status: Skilled Nursing Facility | Payer: Medicare Other | Source: Skilled Nursing Facility | Attending: Internal Medicine | Admitting: Internal Medicine

## 2020-04-29 ENCOUNTER — Emergency Department (HOSPITAL_COMMUNITY): Payer: Medicare Other

## 2020-04-29 ENCOUNTER — Other Ambulatory Visit: Payer: Self-pay

## 2020-04-29 DIAGNOSIS — Z8673 Personal history of transient ischemic attack (TIA), and cerebral infarction without residual deficits: Secondary | ICD-10-CM

## 2020-04-29 DIAGNOSIS — N289 Disorder of kidney and ureter, unspecified: Secondary | ICD-10-CM

## 2020-04-29 DIAGNOSIS — N3000 Acute cystitis without hematuria: Secondary | ICD-10-CM | POA: Diagnosis present

## 2020-04-29 DIAGNOSIS — F0280 Dementia in other diseases classified elsewhere without behavioral disturbance: Secondary | ICD-10-CM | POA: Diagnosis present

## 2020-04-29 DIAGNOSIS — G9341 Metabolic encephalopathy: Secondary | ICD-10-CM | POA: Diagnosis present

## 2020-04-29 DIAGNOSIS — E872 Acidosis, unspecified: Secondary | ICD-10-CM

## 2020-04-29 DIAGNOSIS — Z6820 Body mass index (BMI) 20.0-20.9, adult: Secondary | ICD-10-CM

## 2020-04-29 DIAGNOSIS — Z66 Do not resuscitate: Secondary | ICD-10-CM | POA: Diagnosis present

## 2020-04-29 DIAGNOSIS — G309 Alzheimer's disease, unspecified: Secondary | ICD-10-CM | POA: Diagnosis present

## 2020-04-29 DIAGNOSIS — E876 Hypokalemia: Secondary | ICD-10-CM | POA: Diagnosis not present

## 2020-04-29 DIAGNOSIS — T68XXXA Hypothermia, initial encounter: Secondary | ICD-10-CM

## 2020-04-29 DIAGNOSIS — R636 Underweight: Secondary | ICD-10-CM | POA: Diagnosis present

## 2020-04-29 DIAGNOSIS — Z880 Allergy status to penicillin: Secondary | ICD-10-CM

## 2020-04-29 DIAGNOSIS — I9589 Other hypotension: Secondary | ICD-10-CM | POA: Diagnosis present

## 2020-04-29 DIAGNOSIS — N189 Chronic kidney disease, unspecified: Secondary | ICD-10-CM

## 2020-04-29 DIAGNOSIS — Z20822 Contact with and (suspected) exposure to covid-19: Secondary | ICD-10-CM | POA: Diagnosis present

## 2020-04-29 DIAGNOSIS — R680 Hypothermia, not associated with low environmental temperature: Secondary | ICD-10-CM | POA: Diagnosis present

## 2020-04-29 DIAGNOSIS — F0390 Unspecified dementia without behavioral disturbance: Secondary | ICD-10-CM | POA: Diagnosis present

## 2020-04-29 DIAGNOSIS — Z79899 Other long term (current) drug therapy: Secondary | ICD-10-CM

## 2020-04-29 DIAGNOSIS — R6520 Severe sepsis without septic shock: Secondary | ICD-10-CM | POA: Diagnosis present

## 2020-04-29 DIAGNOSIS — Z7901 Long term (current) use of anticoagulants: Secondary | ICD-10-CM

## 2020-04-29 DIAGNOSIS — R319 Hematuria, unspecified: Secondary | ICD-10-CM

## 2020-04-29 DIAGNOSIS — N1832 Chronic kidney disease, stage 3b: Secondary | ICD-10-CM | POA: Diagnosis present

## 2020-04-29 DIAGNOSIS — D696 Thrombocytopenia, unspecified: Secondary | ICD-10-CM

## 2020-04-29 DIAGNOSIS — E860 Dehydration: Secondary | ICD-10-CM | POA: Diagnosis present

## 2020-04-29 DIAGNOSIS — N390 Urinary tract infection, site not specified: Secondary | ICD-10-CM | POA: Diagnosis present

## 2020-04-29 DIAGNOSIS — N179 Acute kidney failure, unspecified: Secondary | ICD-10-CM | POA: Diagnosis present

## 2020-04-29 DIAGNOSIS — R400 Somnolence: Secondary | ICD-10-CM

## 2020-04-29 DIAGNOSIS — A4181 Sepsis due to Enterococcus: Secondary | ICD-10-CM

## 2020-04-29 DIAGNOSIS — Z86718 Personal history of other venous thrombosis and embolism: Secondary | ICD-10-CM

## 2020-04-29 DIAGNOSIS — Z79890 Hormone replacement therapy (postmenopausal): Secondary | ICD-10-CM

## 2020-04-29 DIAGNOSIS — E861 Hypovolemia: Secondary | ICD-10-CM

## 2020-04-29 DIAGNOSIS — R54 Age-related physical debility: Secondary | ICD-10-CM | POA: Diagnosis present

## 2020-04-29 DIAGNOSIS — E039 Hypothyroidism, unspecified: Secondary | ICD-10-CM | POA: Diagnosis present

## 2020-04-29 MED ORDER — LACTATED RINGERS IV BOLUS (SEPSIS)
1000.0000 mL | Freq: Once | INTRAVENOUS | Status: AC
  Administered 2020-04-30: 1000 mL via INTRAVENOUS

## 2020-04-29 NOTE — ED Provider Notes (Signed)
[facility] EMERGENCY DEPARTMENT Provider Note   CSN: [id number] Arrival date & time: 04/29/20  2240   Time seen 11:15 PM  History Chief Complaint  Patient presents with  . Altered Mental Status   Level 5 caveat for altered mental status  Robert Hurst is a 86 y.o. male.  HPI Patient presents from his nursing home via EMS.  They report that the staff noted the patient was lethargic cold and clammy and nonverbal.  They had taken a rectal temp on him tonight and it was 96.1.  CBG was done which was 178.  When I talked to the patient he opens his eyes but he does not respond verbally or follow any commands.  PCP [name], MD   Patient is DO NOT RESUSCITATE    Past Medical History:  Diagnosis Date  . Alzheimer disease (HCC)   . Cognitive communication deficit   . COVID-19   . Dementia (HCC)   . DVT (deep venous thrombosis) (HCC)   . Dysphagia   . Malnourished (HCC)   . Protein calorie malnutrition (HCC)   . Renal disorder   . Thyroid disease     Patient Active Problem List   Diagnosis Date Noted  . CKD (chronic kidney disease), stage III 02/12/2020  . Hypothyroidism 02/12/2020  . History of DVT (deep vein thrombosis)   . History of COVID-19   . Acute cystitis without hematuria   . Goals of care, counseling/discussion   . Palliative care encounter   . Acute metabolic encephalopathy 08/11/2019  . Syncope and collapse 08/11/2019  . Syncope   . Dementia without behavioral disturbance (HCC) 08/08/2019  . Acute renal failure superimposed on stage 3b chronic kidney disease (HCC) 08/08/2019  . Lactic acidosis 08/08/2019  . Incontinence 08/08/2019    Past Surgical History:  Procedure Laterality Date  . APPENDECTOMY    . BALLOON DILATION N/A 01/15/2018   Procedure: BALLOON DILATION;  Surgeon: Malissa Hippo, MD;  [facility];  Service: Endoscopy;  Laterality: N/A;  . ERCP N/A 01/15/2018   Procedure: ENDOSCOPIC RETROGRADE CHOLANGIOPANCREATOGRAPHY  (ERCP);  Surgeon: Malissa Hippo, MD;  Location[facility];  Service: Endoscopy;  Laterality: N/A;  . REMOVAL OF STONES N/A 01/15/2018   Procedure: REMOVAL OF STONES;  Surgeon: Malissa Hippo, MD;  [facility];  Service: Endoscopy;  Laterality: N/A;  . SPHINCTEROTOMY N/A 01/15/2018   Procedure: SPHINCTEROTOMY;  Surgeon: Malissa Hippo, MD;  [facility];  Service: Endoscopy;  Laterality: N/A;  . TONSILLECTOMY         History reviewed. No pertinent family history.  Social History   Tobacco Use  . Smoking status: Never Smoker  . Smokeless tobacco: Never Used  Vaping Use  . Vaping Use: Unknown  Substance Use Topics  . Alcohol use: Not Currently  . Drug use: Not Currently    Home Medications Prior to Admission medications   Medication Sig Start Date End Date Taking? Authorizing Provider  apixaban (ELIQUIS) 2.5 MG TABS tablet Take 1 tablet (2.5 mg total) by mouth 2 (two) times daily. 08/17/19 02/12/20  Sherryll Burger, Pratik D, DO  levothyroxine (SYNTHROID) 75 MCG tablet Take 0.5 tablets (37.5 mcg total) by mouth daily at 6 (six) AM. 08/13/19   Tat, Onalee Hua, MD  memantine (NAMENDA) 10 MG tablet Take 10 mg by mouth 2 (two) times daily.    [provider]  OLANZapine (ZYPREXA) 2.5 MG tablet Take 2.5 mg by mouth at bedtime.  [provider]  polyethylene glycol (MIRALAX / GLYCOLAX) 17 g packet Take 17 g by mouth daily.    [provider]  senna (SENOKOT) 8.6 MG tablet Take 1 tablet by mouth daily.    [provider]    Allergies    Penicillins  Review of Systems   Review of Systems  Unable to perform ROS: Mental status change    Physical Exam Updated Vital Signs BP 108/71   Pulse (!) 56   Temp (!) 97.4 F (36.3 C)   Resp 12   Ht 5\' 4"  (1.626 m)   Wt 53 kg   SpO2 98%   BMI 20.06 kg/m   Physical Exam Vitals and nursing note reviewed.  Constitutional:      Comments: Frail elderly thin male  HENT:     Head:  Normocephalic and atraumatic.     Right Ear: External ear normal.     Left Ear: External ear normal.     Mouth/Throat:     Comments: Does not open his mouth to command Eyes:     Extraocular Movements: Extraocular movements intact.     Conjunctiva/sclera: Conjunctivae normal.     Pupils: Pupils are equal, round, and reactive to light.  Cardiovascular:     Rate and Rhythm: Regular rhythm. Bradycardia present.  Pulmonary:     Effort: Pulmonary effort is normal. No respiratory distress.     Breath sounds: Normal breath sounds.  Abdominal:     General: Bowel sounds are normal. There is distension.     Palpations: Abdomen is soft.     Comments: Patient's abdomen feels a little bit distended, when I palpated it he seems uncomfortable and tries to move away.  Musculoskeletal:        General: No deformity.     Cervical back: Normal range of motion.  Skin:    Comments: Patient skin is cool to touch  Neurological:     Comments: Unable to assess  Psychiatric:     Comments: Unable to assess     ED Results / Procedures / Treatments   Labs (all labs ordered are listed, but only abnormal results are displayed) Results for orders placed or performed during the hospital encounter of 04/29/20  SARS Coronavirus 2 by RT PCR (hospital order, performed in [facility] hospital lab) Nasopharyngeal Nasopharyngeal Swab   Specimen: Nasopharyngeal Swab  Result Value Ref Range   SARS Coronavirus 2 NEGATIVE NEGATIVE  Comprehensive metabolic panel  Result Value Ref Range   Sodium 144 135 - 145 mmol/L   Potassium 4.4 3.5 - 5.1 mmol/L   Chloride 106 98 - 111 mmol/L   CO2 22 22 - 32 mmol/L   Glucose, Bld 181 (H) 70 - 99 mg/dL   BUN 21 8 - 23 mg/dL   Creatinine, Ser [facility] (H) 0.61 - 1.24 mg/dL   Calcium 9.8 8.9 - 4.76 mg/dL   Total Protein 8.8 (H) 6.5 - 8.1 g/dL   Albumin 4.3 3.5 - 5.0 g/dL   AST 27 15 - 41 U/L   ALT 15 0 - 44 U/L   Alkaline Phosphatase 107 38 - 126 U/L   Total Bilirubin 0.8 0.3 -  1.2 mg/dL   GFR calc non Af Amer 28 (L) >60 mL/min   GFR calc Af Amer 32 (L) >60 mL/min   Anion gap 16 (H) 5 - 15  CBC  Result Value Ref Range   WBC 8.4 4.0 - 10.5 K/uL   RBC 5.84 (H) 4.22 -  5.81 MIL/uL   Hemoglobin 17.3 (H) 13.0 - 17.0 g/dL   HCT 16.153.2 (H) 39 - 52 %   MCV 91.1 80.0 - 100.0 fL   MCH 29.6 26.0 - 34.0 pg   MCHC 32.5 30.0 - 36.0 g/dL   RDW 09.616.0 (H) 04.511.5 - 40.915.5 %   Platelets 192 150 - 400 K/uL   nRBC 0.0 0.0 - 0.2 %  Lactic acid, plasma  Result Value Ref Range   Lactic Acid, Venous 6.4 (HH) 0.5 - 1.9 mmol/L  Protime-INR  Result Value Ref Range   Prothrombin Time 14.1 11.4 - 15.2 seconds   INR 1.1 0.8 - 1.2  APTT  Result Value Ref Range   aPTT 28 24 - 36 seconds  Urinalysis, Routine w reflex microscopic Urine, Catheterized  Result Value Ref Range   Color, Urine YELLOW YELLOW   APPearance CLOUDY (A) CLEAR   Specific Gravity, Urine 1.010 1.005 - 1.030   pH 5.0 5.0 - 8.0   Glucose, UA NEGATIVE NEGATIVE mg/dL   Hgb urine dipstick LARGE (A) NEGATIVE   Bilirubin Urine NEGATIVE NEGATIVE   Ketones, ur NEGATIVE NEGATIVE mg/dL   Protein, ur 811100 (A) NEGATIVE mg/dL   Nitrite NEGATIVE NEGATIVE   Leukocytes,Ua LARGE (A) NEGATIVE   RBC / HPF >50 (H) 0 - 5 RBC/hpf   WBC, UA 21-50 0 - 5 WBC/hpf   Bacteria, UA RARE (A) NONE SEEN   Squamous Epithelial / LPF 0-5 0 - 5   WBC Clumps PRESENT    Mucus PRESENT   TSH  Result Value Ref Range   TSH 13.313 (H) 0.350 - 4.500 uIU/mL  Troponin I (High Sensitivity)  Result Value Ref Range   Troponin I (High Sensitivity) 15 <18 ng/L    Laboratory interpretation all normal except lactic acidosis, hypothyroidism which explains his hypothermia, UTI, worsening of his prior renal insufficiency   EKG EKG Interpretation  Date/Time:  Saturday April 29 2020 23:01:12 EDT Ventricular Rate:  59 PR Interval:    QRS Duration: 122 QT Interval:  479 QTC Calculation: 475 R Axis:   49 Text Interpretation: Sinus rhythm Atrial premature  complexes Right bundle branch block Nonspecific T abnormalities, lateral leads No significant change since last tracing 12 Feb 2020 Confirmed by Devoria AlbeKnapp, Kyndall Chaplin ([id number]) on 04/29/2020 11:03:05 PM   Radiology DG Chest Port 1 View  Result Date: 04/30/2020 CLINICAL DATA:  Sepsis EXAM: PORTABLE CHEST 1 VIEW COMPARISON:  Feb 12, 2020 FINDINGS: The heart size and mediastinal contours are within normal limits. Aortic knob calcifications are seen. Both lungs are clear. The visualized skeletal structures are unremarkable. IMPRESSION: No active disease. Electronically Signed   By: Jonna ClarkBindu  Avutu M.D.   On: 04/30/2020 00:05    Procedures .Critical Care Performed by: Devoria AlbeKnapp, Lyndie Vanderloop, MD Authorized by: Devoria AlbeKnapp, Kindal Ponti, MD   Critical care provider statement:    Critical care time (minutes):  38   Critical care was necessary to treat or prevent imminent or life-threatening deterioration of the following conditions:  Circulatory failure and sepsis   Critical care was time spent personally by me on the following activities:  Discussions with consultants, obtaining history from patient or surrogate, ordering and review of laboratory studies, ordering and review of radiographic studies, pulse oximetry and re-evaluation of patient's condition   (including critical care time)  Medications Ordered in ED Medications  vancomycin (VANCOCIN) IVPB 1000 mg/200 mL premix (has no administration in time range)  lactated ringers bolus 1,000 mL (1,000 mLs Intravenous New Bag/Given 04/30/20 0011)  ED Course  I have reviewed the triage vital signs and the nursing notes.  Pertinent labs & imaging results that were available during my care of the patient were reviewed by me and considered in my medical decision making (see chart for details).    MDM Rules/Calculators/A&P                           Patient was started on presumed sepsis protocol and given 1 L of normal saline, we were of lactated Ringer's.  He was placed on a warming  blanket.  Due to his hypothermia TSH was sent in addition to regular blood work.  Sepsis - Repeat Assessment  Performed at: 3:20 AM     Vitals     Blood pressure 112/66, pulse (!) 50, temperature (!) 97.4 F (36.3 C), resp. rate 11, height 5\' 4"  (1.626 m), weight 53 kg, SpO2 99 %.   Recheck at 3:20 AM blood pressure is 108/71, heart rate 52.  When I review his lab results he does appear to have a UTI.  Last urine culture from Feb 12, 2020 grew over 100,000 colonies of Enterococcus faecalis that was sensitive to ampicillin, nitrofurantoin, and vancomycin.  Patient has penicillin allergy so he was given IV vancomycin.  4:02 AM Dr. Feb 14, 2020,  hospitalist will admit.  Final Clinical Impression(s) / ED Diagnoses Final diagnoses:  Somnolence  Urinary tract infection with hematuria, site unspecified  Lactic acidosis  Hypotension due to hypovolemia  Acute on chronic renal insufficiency  Hypothyroidism, unspecified type  Hypothermia, initial encounter    Rx / DC Orders  Plan admission  Randol Kern, MD, Devoria Albe, MD 04/30/20 [phone]

## 2020-04-29 NOTE — ED Triage Notes (Signed)
Pt from [facility] home via EMS after staff noted pt to be lethargic, cold and clammy, and non-verbal. Per nsg home staff; rectal temp was 96.1 and blood glucose was 178.

## 2020-04-30 ENCOUNTER — Inpatient Hospital Stay: Payer: Self-pay

## 2020-04-30 DIAGNOSIS — E039 Hypothyroidism, unspecified: Secondary | ICD-10-CM

## 2020-04-30 DIAGNOSIS — N3000 Acute cystitis without hematuria: Secondary | ICD-10-CM | POA: Diagnosis not present

## 2020-04-30 DIAGNOSIS — Z6820 Body mass index (BMI) 20.0-20.9, adult: Secondary | ICD-10-CM | POA: Diagnosis not present

## 2020-04-30 DIAGNOSIS — Z20822 Contact with and (suspected) exposure to covid-19: Secondary | ICD-10-CM | POA: Diagnosis present

## 2020-04-30 DIAGNOSIS — Z79899 Other long term (current) drug therapy: Secondary | ICD-10-CM | POA: Diagnosis not present

## 2020-04-30 DIAGNOSIS — E876 Hypokalemia: Secondary | ICD-10-CM | POA: Diagnosis not present

## 2020-04-30 DIAGNOSIS — E860 Dehydration: Secondary | ICD-10-CM | POA: Diagnosis present

## 2020-04-30 DIAGNOSIS — R54 Age-related physical debility: Secondary | ICD-10-CM | POA: Diagnosis present

## 2020-04-30 DIAGNOSIS — D696 Thrombocytopenia, unspecified: Secondary | ICD-10-CM | POA: Diagnosis present

## 2020-04-30 DIAGNOSIS — R6520 Severe sepsis without septic shock: Secondary | ICD-10-CM | POA: Diagnosis present

## 2020-04-30 DIAGNOSIS — A419 Sepsis, unspecified organism: Secondary | ICD-10-CM

## 2020-04-30 DIAGNOSIS — R680 Hypothermia, not associated with low environmental temperature: Secondary | ICD-10-CM | POA: Diagnosis present

## 2020-04-30 DIAGNOSIS — Z515 Encounter for palliative care: Secondary | ICD-10-CM | POA: Diagnosis not present

## 2020-04-30 DIAGNOSIS — G9341 Metabolic encephalopathy: Secondary | ICD-10-CM | POA: Diagnosis present

## 2020-04-30 DIAGNOSIS — I9589 Other hypotension: Secondary | ICD-10-CM | POA: Diagnosis present

## 2020-04-30 DIAGNOSIS — R636 Underweight: Secondary | ICD-10-CM | POA: Diagnosis present

## 2020-04-30 DIAGNOSIS — E872 Acidosis: Secondary | ICD-10-CM | POA: Diagnosis present

## 2020-04-30 DIAGNOSIS — Z79890 Hormone replacement therapy (postmenopausal): Secondary | ICD-10-CM | POA: Diagnosis not present

## 2020-04-30 DIAGNOSIS — N390 Urinary tract infection, site not specified: Secondary | ICD-10-CM | POA: Diagnosis present

## 2020-04-30 DIAGNOSIS — G309 Alzheimer's disease, unspecified: Secondary | ICD-10-CM | POA: Diagnosis present

## 2020-04-30 DIAGNOSIS — A4181 Sepsis due to Enterococcus: Secondary | ICD-10-CM | POA: Diagnosis present

## 2020-04-30 DIAGNOSIS — N179 Acute kidney failure, unspecified: Secondary | ICD-10-CM | POA: Diagnosis present

## 2020-04-30 DIAGNOSIS — Z66 Do not resuscitate: Secondary | ICD-10-CM | POA: Diagnosis present

## 2020-04-30 DIAGNOSIS — F0390 Unspecified dementia without behavioral disturbance: Secondary | ICD-10-CM | POA: Diagnosis not present

## 2020-04-30 DIAGNOSIS — F0280 Dementia in other diseases classified elsewhere without behavioral disturbance: Secondary | ICD-10-CM | POA: Diagnosis present

## 2020-04-30 DIAGNOSIS — Z7189 Other specified counseling: Secondary | ICD-10-CM | POA: Diagnosis not present

## 2020-04-30 DIAGNOSIS — E861 Hypovolemia: Secondary | ICD-10-CM | POA: Diagnosis present

## 2020-04-30 DIAGNOSIS — N289 Disorder of kidney and ureter, unspecified: Secondary | ICD-10-CM | POA: Diagnosis not present

## 2020-04-30 DIAGNOSIS — N1832 Chronic kidney disease, stage 3b: Secondary | ICD-10-CM | POA: Diagnosis present

## 2020-04-30 DIAGNOSIS — Z7901 Long term (current) use of anticoagulants: Secondary | ICD-10-CM | POA: Diagnosis not present

## 2020-04-30 LAB — COMPREHENSIVE METABOLIC PANEL
ALT: 15 U/L (ref 0–44)
AST: 27 U/L (ref 15–41)
Albumin: 4.3 g/dL (ref 3.5–5.0)
Alkaline Phosphatase: 107 U/L (ref 38–126)
Anion gap: 16 — ABNORMAL HIGH (ref 5–15)
BUN: 21 mg/dL (ref 8–23)
CO2: 22 mmol/L (ref 22–32)
Calcium: 9.8 mg/dL (ref 8.9–10.3)
Chloride: 106 mmol/L (ref 98–111)
Creatinine, Ser: 2.09 mg/dL — ABNORMAL HIGH (ref 0.61–1.24)
GFR calc Af Amer: 32 mL/min — ABNORMAL LOW (ref >60)
GFR calc non Af Amer: 28 mL/min — ABNORMAL LOW (ref >60)
Glucose, Bld: 181 mg/dL — ABNORMAL HIGH (ref 70–99)
Potassium: 4.4 mmol/L (ref 3.5–5.1)
Sodium: 144 mmol/L (ref 135–145)
Total Bilirubin: 0.8 mg/dL (ref 0.3–1.2)
Total Protein: 8.8 g/dL — ABNORMAL HIGH (ref 6.5–8.1)

## 2020-04-30 LAB — APTT: aPTT: 28 seconds (ref 24–36)

## 2020-04-30 LAB — BASIC METABOLIC PANEL
Anion gap: 12 (ref 5–15)
BUN: 26 mg/dL — ABNORMAL HIGH (ref 8–23)
CO2: 21 mmol/L — ABNORMAL LOW (ref 22–32)
Calcium: 8.8 mg/dL — ABNORMAL LOW (ref 8.9–10.3)
Chloride: 110 mmol/L (ref 98–111)
Creatinine, Ser: 1.92 mg/dL — ABNORMAL HIGH (ref 0.61–1.24)
GFR calc Af Amer: 36 mL/min — ABNORMAL LOW (ref >60)
GFR calc non Af Amer: 31 mL/min — ABNORMAL LOW (ref >60)
Glucose, Bld: 130 mg/dL — ABNORMAL HIGH (ref 70–99)
Potassium: 4.5 mmol/L (ref 3.5–5.1)
Sodium: 143 mmol/L (ref 135–145)

## 2020-04-30 LAB — CBC
HCT: 53.2 % — ABNORMAL HIGH (ref 39.0–52.0)
HCT: 44 % (ref 39.0–52.0)
Hemoglobin: 17.3 g/dL — ABNORMAL HIGH (ref 13.0–17.0)
Hemoglobin: 14.8 g/dL (ref 13.0–17.0)
MCH: 29.6 pg (ref 26.0–34.0)
MCH: 29.8 pg (ref 26.0–34.0)
MCHC: 32.5 g/dL (ref 30.0–36.0)
MCHC: 33.6 g/dL (ref 30.0–36.0)
MCV: 91.1 fL (ref 80.0–100.0)
MCV: 88.7 fL (ref 80.0–100.0)
Platelets: 192 10*3/uL (ref 150–400)
Platelets: 160 10*3/uL (ref 150–400)
RBC: 5.84 MIL/uL — ABNORMAL HIGH (ref 4.22–5.81)
RBC: 4.96 MIL/uL (ref 4.22–5.81)
RDW: 16 % — ABNORMAL HIGH (ref 11.5–15.5)
RDW: 15.8 % — ABNORMAL HIGH (ref 11.5–15.5)
WBC: 8.4 10*3/uL (ref 4.0–10.5)
WBC: 8.7 10*3/uL (ref 4.0–10.5)
nRBC: 0 % (ref 0.0–0.2)
nRBC: 0 % (ref 0.0–0.2)

## 2020-04-30 LAB — URINALYSIS, ROUTINE W REFLEX MICROSCOPIC
Bilirubin Urine: NEGATIVE
Glucose, UA: NEGATIVE mg/dL
Ketones, ur: NEGATIVE mg/dL
Nitrite: NEGATIVE
Protein, ur: 100 mg/dL — AB
RBC / HPF: >50 RBC/hpf — ABNORMAL HIGH (ref 0–5)
Specific Gravity, Urine: 1.01 (ref 1.005–1.030)
pH: 5 (ref 5.0–8.0)

## 2020-04-30 LAB — TSH: TSH: 13.313 u[IU]/mL — ABNORMAL HIGH (ref 0.350–4.500)

## 2020-04-30 LAB — SARS CORONAVIRUS 2 BY RT PCR (HOSPITAL ORDER, PERFORMED IN ~~LOC~~ HOSPITAL LAB): SARS Coronavirus 2: NEGATIVE

## 2020-04-30 LAB — PROTIME-INR
INR: 1.1 (ref 0.8–1.2)
Prothrombin Time: 14.1 seconds (ref 11.4–15.2)

## 2020-04-30 LAB — LACTIC ACID, PLASMA: Lactic Acid, Venous: 6.4 mmol/L (ref 0.5–1.9)

## 2020-04-30 LAB — TROPONIN I (HIGH SENSITIVITY): Troponin I (High Sensitivity): 15 ng/L (ref <18)

## 2020-04-30 MED ORDER — SODIUM CHLORIDE 0.9% FLUSH
10.0000 mL | Freq: Two times a day (BID) | INTRAVENOUS | Status: DC
  Administered 2020-04-30 – 2020-05-03 (×8): 10 mL

## 2020-04-30 MED ORDER — SODIUM CHLORIDE 0.9 % IV SOLN: INTRAVENOUS | Status: DC

## 2020-04-30 MED ORDER — SODIUM CHLORIDE 0.9% FLUSH: 10.0000 mL | INTRAVENOUS | Status: DC | PRN

## 2020-04-30 MED ORDER — APIXABAN 2.5 MG PO TABS
2.5000 mg | ORAL_TABLET | Freq: Two times a day (BID) | ORAL | Status: DC
  Administered 2020-04-30 – 2020-05-04 (×8): 2.5 mg via ORAL
  Filled 2020-04-30 (×9): qty 1

## 2020-04-30 MED ORDER — VANCOMYCIN HCL IN DEXTROSE 1-5 GM/200ML-% IV SOLN
1000.0000 mg | Freq: Once | INTRAVENOUS | Status: AC
  Administered 2020-04-30: 1000 mg via INTRAVENOUS
  Filled 2020-04-30: qty 200

## 2020-04-30 MED ORDER — VANCOMYCIN HCL IN DEXTROSE 1-5 GM/200ML-% IV SOLN
1000.0000 mg | Freq: Once | INTRAVENOUS | Status: DC
  Filled 2020-04-30: qty 200

## 2020-04-30 MED ORDER — LEVOTHYROXINE SODIUM 50 MCG PO TABS
50.0000 ug | ORAL_TABLET | Freq: Every day | ORAL | Status: DC
  Administered 2020-05-01 – 2020-05-04 (×4): 50 ug via ORAL
  Filled 2020-04-30 (×5): qty 1

## 2020-04-30 MED ORDER — CHLORHEXIDINE GLUCONATE CLOTH 2 % EX PADS
6.0000 | MEDICATED_PAD | Freq: Every day | CUTANEOUS | Status: DC
  Administered 2020-04-30 – 2020-05-04 (×4): 6 via TOPICAL

## 2020-04-30 MED ORDER — MEMANTINE HCL 10 MG PO TABS
10.0000 mg | ORAL_TABLET | Freq: Two times a day (BID) | ORAL | Status: DC
  Administered 2020-04-30 – 2020-05-03 (×7): 10 mg via ORAL
  Filled 2020-04-30 (×8): qty 1

## 2020-04-30 MED ORDER — VANCOMYCIN VARIABLE DOSE PER UNSTABLE RENAL FUNCTION (PHARMACIST DOSING): Status: DC

## 2020-04-30 NOTE — ED Notes (Signed)
Phleb struggling to get blood work

## 2020-04-30 NOTE — H&P (Signed)
[facility] H&P   Patient Demographics:    Robert Hurst, is a 86 y.o. male  MRN: [id number]   DOB - 11/05/1931  Admit Date - 04/29/2020  Outpatient Primary MD for the patient is [name], MD  Referring MD/NP/PA: Dr Lynelle Doctor  Patient coming from: Home  Chief Complaint  Patient presents with  . Altered Mental Status      HPI:    Robert Hurst  is a 86 y.o. male, with past medical history of Alzheimer's dementia, history of stroke COVID-19, history of DVT on Eliquis, hypothyroidism, patient is demented, nonverbal, unable to provide any history, history was obtained from ED staff and medical records, patient was sent by facility given he was lethargic, cold and clammy and nonverbal, sedated to check his rectal temperature this evening which was 96.1, glucose was done at the facility which was 178, patient with hospitalization in the past with similar presentation secondary to UTI, he had similar admission 01/2020 with acute encephalopathy secondary to UTI, where his urine culture growing Enterococcus, pansensitive. - in ED patient was hypotensive 89/53, but this did respond to fluid bolus, he was hypothermic 35.7 requiring Bair hugger, he was lethargic, his work-up was significant for elevated lactic acid at 6.4, elevated creatinine at 2.09, patient was started on IV vancomycin given his UA was positive, chest x-ray with no acute process, [facility] hospitalist consulted to admit.    Review of systems:    Unable to obtain any review of system given his advanced dementia   With Past History of the following :    Past Medical History:  Diagnosis Date  . Alzheimer disease (HCC)   . Cognitive communication deficit   . COVID-19   . Dementia (HCC)   . DVT (deep venous thrombosis) (HCC)   . Dysphagia   . Malnourished (HCC)   . Protein calorie malnutrition (HCC)   . Renal disorder   . Thyroid  disease       Past Surgical History:  Procedure Laterality Date  . APPENDECTOMY    . BALLOON DILATION N/A 01/15/2018   Procedure: BALLOON DILATION;  Surgeon: Malissa Hippo, MD;  [facility];  Service: Endoscopy;  Laterality: N/A;  . ERCP N/A 01/15/2018   Procedure: ENDOSCOPIC RETROGRADE CHOLANGIOPANCREATOGRAPHY (ERCP);  Surgeon: Malissa Hippo, MD;  [facility];  Service: Endoscopy;  Laterality: N/A;  . REMOVAL OF STONES N/A 01/15/2018   Procedure: REMOVAL OF STONES;  Surgeon: Malissa Hippo, MD;  [facility];  Service: Endoscopy;  Laterality: N/A;  . SPHINCTEROTOMY N/A 01/15/2018   Procedure: SPHINCTEROTOMY;  Surgeon: Malissa Hippo, MD;  [facility];  Service: Endoscopy;  Laterality: N/A;  . TONSILLECTOMY        Social History:     Social History   Tobacco Use  . Smoking status: Never Smoker  . Smokeless tobacco: Never  Used  Substance Use Topics  . Alcohol use: Not Currently        Family History :   Unable to obtain patient's history given his advanced dementia   Home Medications:   Prior to Admission medications   Medication Sig Start Date End Date Taking? Authorizing Provider  apixaban (ELIQUIS) 2.5 MG TABS tablet Take 1 tablet (2.5 mg total) by mouth 2 (two) times daily. 08/17/19 02/12/20  Sherryll Burger, Pratik D, DO  levothyroxine (SYNTHROID) 75 MCG tablet Take 0.5 tablets (37.5 mcg total) by mouth daily at 6 (six) AM. 08/13/19   Tat, Onalee Hua, MD  memantine (NAMENDA) 10 MG tablet Take 10 mg by mouth 2 (two) times daily.    [provider]  OLANZapine (ZYPREXA) 2.5 MG tablet Take 2.5 mg by mouth at bedtime.    [provider]  polyethylene glycol (MIRALAX / GLYCOLAX) 17 g packet Take 17 g by mouth daily.    [provider]  senna (SENOKOT) 8.6 MG tablet Take 1 tablet by mouth daily.    [provider]     Allergies:     Allergies  Allergen Reactions  . Penicillins     Has patient  had a PCN reaction causing immediate rash, facial/tongue/throat swelling, SOB or lightheadedness with hypotension: Unknown Has patient had a PCN reaction causing severe rash involving mucus membranes or skin necrosis: Unknown Has patient had a PCN reaction that required hospitalization: Unknown Has patient had a PCN reaction occurring within the last 10 years: Unknown If all of the above answers are "NO", then may proceed with Cephalosporin use.      Physical Exam:   Vitals  Blood pressure 112/66, pulse (!) 50, temperature (!) 97.4 F (36.3 C), resp. rate 11, height 5\' 4"  (1.626 m), weight 53 kg, SpO2 99 %.   1. General pale, thin appearing male, laying in bed in no apparent distress  2.  Somnolent, does not open eyes or follow commands or answer any questions .   3. No F.N deficits, ALL C.Nerves Intact, to be moving all extremities without gross deficits  4. Ears and Eyes appear Normal, Conjunctivae clear, dry oral Mucosa.  5. Supple Neck, No JVD, No cervical lymphadenopathy appriciated, No Carotid Bruits.  6. Symmetrical Chest wall movement, Good air movement bilaterally, CTAB.  7. RRR, No Gallops, Rubs or Murmurs, No Parasternal Heave.  8. Positive Bowel Sounds, Abdomen Soft, No tenderness, No organomegaly appriciated,No rebound -guarding or rigidity.  9.  No Cyanosis, Normal Skin Turgor, No Skin Rash or Bruise.  10. Good muscle tone,  joints appear normal , no effusions, Normal ROM.  11. No Palpable Lymph Nodes in Neck or Axillae    Data Review:    CBC Recent Labs  Lab 04/30/20 0009  WBC 8.4  HGB 17.3*  HCT 53.2*  PLT 192  MCV 91.1  MCH 29.6  MCHC 32.5  RDW 16.0*   ------------------------------------------------------------------------------------------------------------------  Chemistries  Recent Labs  Lab 04/30/20 0009  NA 144  K 4.4  CL 106  CO2 22  GLUCOSE 181*  BUN 21  CREATININE 2.09*  CALCIUM 9.8  AST 27  ALT 15  ALKPHOS 107  BILITOT  0.8   ------------------------------------------------------------------------------------------------------------------ estimated creatinine clearance is 19 mL/min (A) (by C-G formula based on SCr of 2.09 mg/dL (H)). ------------------------------------------------------------------------------------------------------------------ Recent Labs    04/30/20 0009  TSH 13.313*    Coagulation profile Recent Labs  Lab 04/30/20 0009  INR 1.1   ------------------------------------------------------------------------------------------------------------------- No results for input(s): DDIMER  in the last 72 hours. -------------------------------------------------------------------------------------------------------------------  Cardiac Enzymes No results for input(s): CKMB, TROPONINI, MYOGLOBIN in the last 168 hours.  Invalid input(s): CK ------------------------------------------------------------------------------------------------------------------ No results found for: BNP   ---------------------------------------------------------------------------------------------------------------  Urinalysis    Component Value Date/Time   COLORURINE YELLOW 04/30/2020 0145   APPEARANCEUR CLOUDY (A) 04/30/2020 0145   LABSPEC 1.010 04/30/2020 0145   PHURINE 5.0 04/30/2020 0145   GLUCOSEU NEGATIVE 04/30/2020 0145   HGBUR LARGE (A) 04/30/2020 0145   BILIRUBINUR NEGATIVE 04/30/2020 0145   KETONESUR NEGATIVE 04/30/2020 0145   PROTEINUR 100 (A) 04/30/2020 0145   NITRITE NEGATIVE 04/30/2020 0145   LEUKOCYTESUR LARGE (A) 04/30/2020 0145    ----------------------------------------------------------------------------------------------------------------   Imaging Results:    DG Chest Port 1 View  Result Date: 04/30/2020 CLINICAL DATA:  Sepsis EXAM: PORTABLE CHEST 1 VIEW COMPARISON:  Feb 12, 2020 FINDINGS: The heart size and mediastinal contours are within normal limits. Aortic knob calcifications  are seen. Both lungs are clear. The visualized skeletal structures are unremarkable. IMPRESSION: No active disease. Electronically Signed   By: [name] M.D.   On: 04/30/2020 00:05     Assessment & Plan:    Active Problems:   Dementia without behavioral disturbance (HCC)   Acute renal failure superimposed on stage 3b chronic kidney disease (HCC)   Acute metabolic encephalopathy   Acute cystitis without hematuria   Hypothyroidism   History of DVT (deep vein thrombosis)   UTI (urinary tract infection)    Sepsis due to UTI -Sepsis criteria present on admission, as he is hypotensive 89/56, and hypothermic 35.7, evidence of endorgan dysfunction including encephalopathy, AKI and elevated lactic acid . -Follow-up blood cultures, no evidence on chest x-ray for infectious process, this is most likely related to UTI . -For now continue with IV vancomycin given known history of Enterococcus pansensitive UTI in the past, given his penicillin allergy will continue with IV vancomycin . -Continue with bear hugger for hypothermia.  Acute metabolic encephalopathy -Underlying dementia, but worsening of baseline, due to above, as well please see discussion below regarding hypothyroidism.  Hypothyroidism -TSH elevated at 13, will increase his Synthroid from 37.5> 50 mcg   CKD stage III -Type II, continue with IV fluids  Dementia -Continue with home medications  DVT -Continue with Eliquis   DVT Prophylaxis on Eliquis  AM Labs Ordered, also please review Full Orders  Family Communication: none at bedside  Code Status: DNR(DNR form presents from the facility)  Likely DC to SNF  Condition GUARDED    Consults called: None  Admission status: inpatient  Time spent in minutes : 60 minutes   Huey Bienenstock M.D on 04/30/2020 at 4:10 AM   [facility] - Office  [phone]

## 2020-04-30 NOTE — ED Notes (Signed)
Vascular access team notified of need to place PICC, consent form in chart with verbal consent from daughter vanessa @ [phone], pt being transported to floor from ED

## 2020-04-30 NOTE — ED Notes (Signed)
Family updated.

## 2020-04-30 NOTE — ED Notes (Signed)
Curis updated on plan of care

## 2020-04-30 NOTE — ED Notes (Signed)
IV infiltrated in left AC, multiple RNs attempted to start new line without success. Hospitalist informed.

## 2020-04-30 NOTE — ED Notes (Signed)
Date and time results received: 04/30/20 1:57 AM  (use smartphrase ".now" to insert current time)  Test: lactic Critical Value: 6.4  Name of Provider Notified: knapp  Orders Received? Or Actions Taken?: see orders

## 2020-04-30 NOTE — Progress Notes (Signed)
Patient seen and examined.  Admitted after midnight secondary to severe sepsis in the setting of UTI.  Patient has met sepsis criteria on admission with hypothermia, hypotension, hypoxia, worsening mentation, acute on chronic renal failure hospital and organ and elevated lactic acid of 6.4.  Sepsis protocol has been initiated and patient started on IV vancomycin based on previous urine cultures.  He has difficulty IV access and despite decreased creatinine clearance from chronic renal failure would not be a candidate for hemodialysis in the future require so we will go ahead and place PICC line to facilitate fluid resuscitation and IV antibiotics.  Please refer to H&P written by Dr. Waldron Labs on 04/30/2020 for further info/details on admission.  Plan: -Continue IV fluids. -Okay to place PICC line despite decreased in his creatinine clearance. -Follow sepsis features. -Follow lactic acid level. -Continue current IV antibiotics and follow culture. -Continue supportive care. -Palliative care consulted for advance care planning.  Barton Dubois MD [phone]

## 2020-04-30 NOTE — Progress Notes (Signed)
Peripherally Inserted Central Catheter Placement  The IV Nurse has discussed with the patient and/or persons authorized to consent for the patient, the purpose of this procedure and the potential benefits and risks involved with this procedure.  The benefits include less needle sticks, lab draws from the catheter, and the patient may be discharged home with the catheter. Risks include, but not limited to, infection, bleeding, blood clot (thrombus formation), and puncture of an artery; nerve damage and irregular heartbeat and possibility to perform a PICC exchange if needed/ordered by physician.  Alternatives to this procedure were also discussed.  Bard Power PICC patient education guide, fact sheet on infection prevention and patient information card has been provided to patient /or left at bedside.   Consent obtained via telephone with daughter   PICC Placement Documentation  PICC Double Lumen 04/30/20 PICC Left Brachial 42 cm 1 cm (Active)  Indication for Insertion or Continuance of Line Poor Vasculature-patient has had multiple peripheral attempts or PIVs lasting less than 24 hours 04/30/20 0800  Exposed Catheter (cm) 1 cm 04/30/20 0800  Site Assessment Clean;Dry;Intact 04/30/20 0800  Lumen #1 Status Flushed;Saline locked;Blood return noted 04/30/20 0800  Lumen #2 Status Flushed;Saline locked;Blood return noted 04/30/20 0800  Dressing Type Transparent;Securing device 04/30/20 0800  Dressing Status Clean;Dry;Intact;Antimicrobial disc in place 04/30/20 0800  Dressing Change Due 05/07/20 04/30/20 0800       Franne Grip Renee 04/30/2020, 8:50 AM

## 2020-04-30 NOTE — Plan of Care (Signed)

## 2020-05-01 DIAGNOSIS — N179 Acute kidney failure, unspecified: Secondary | ICD-10-CM

## 2020-05-01 DIAGNOSIS — N1832 Chronic kidney disease, stage 3b: Secondary | ICD-10-CM

## 2020-05-01 DIAGNOSIS — F0390 Unspecified dementia without behavioral disturbance: Secondary | ICD-10-CM

## 2020-05-01 DIAGNOSIS — N3000 Acute cystitis without hematuria: Secondary | ICD-10-CM

## 2020-05-01 DIAGNOSIS — Z515 Encounter for palliative care: Secondary | ICD-10-CM

## 2020-05-01 DIAGNOSIS — Z7189 Other specified counseling: Secondary | ICD-10-CM

## 2020-05-01 DIAGNOSIS — Z86718 Personal history of other venous thrombosis and embolism: Secondary | ICD-10-CM

## 2020-05-01 LAB — BASIC METABOLIC PANEL
Anion gap: 10 (ref 5–15)
BUN: 26 mg/dL — ABNORMAL HIGH (ref 8–23)
CO2: 24 mmol/L (ref 22–32)
Calcium: 8.7 mg/dL — ABNORMAL LOW (ref 8.9–10.3)
Chloride: 112 mmol/L — ABNORMAL HIGH (ref 98–111)
Creatinine, Ser: 1.84 mg/dL — ABNORMAL HIGH (ref 0.61–1.24)
GFR calc Af Amer: 38 mL/min — ABNORMAL LOW (ref >60)
GFR calc non Af Amer: 32 mL/min — ABNORMAL LOW (ref >60)
Glucose, Bld: 92 mg/dL (ref 70–99)
Potassium: 4.4 mmol/L (ref 3.5–5.1)
Sodium: 146 mmol/L — ABNORMAL HIGH (ref 135–145)

## 2020-05-01 MED ORDER — VANCOMYCIN HCL 750 MG/150ML IV SOLN
750.0000 mg | INTRAVENOUS | Status: DC
  Administered 2020-05-02: 750 mg via INTRAVENOUS
  Filled 2020-05-01: qty 150

## 2020-05-01 MED ORDER — SENNA 8.6 MG PO TABS
1.0000 | ORAL_TABLET | Freq: Every day | ORAL | Status: DC
  Administered 2020-05-01 – 2020-05-03 (×3): 8.6 mg via ORAL
  Filled 2020-05-01 (×3): qty 1

## 2020-05-01 NOTE — NC FL2 (Signed)
[place] MEDICAID FL2 LEVEL OF CARE SCREENING TOOL     IDENTIFICATION  Patient Name: Robert Hurst Birthdate: Aug 16, 1932 Sex: male Admission Date (Current Location): 04/29/2020  Robert Hurst Number:  Robert Hurst [id number] S Facility and Address:  [facility],  [address] [place] [address]      Provider Number: [id number]  Attending Physician Name and Address:  Vassie Loll, MD  Relative Name and Phone Number:  Robert Hurst (Daughter) [phone] [facility] Phone)    Current Level of Care: Hospital Recommended Level of Care: Skilled Nursing Facility Prior Approval Number:    Date Approved/Denied:   PASRR Number:    Discharge Plan: SNF    Current Diagnoses: Patient Active Problem List   Diagnosis Date Noted  . UTI (urinary tract infection) 04/30/2020  . CKD (chronic kidney disease), stage III 02/12/2020  . Hypothyroidism 02/12/2020  . History of DVT (deep vein thrombosis)   . History of COVID-19   . Acute cystitis without hematuria   . Goals of care, counseling/discussion   . Palliative care encounter   . Acute metabolic encephalopathy 08/11/2019  . Syncope and collapse 08/11/2019  . Syncope   . Dementia without behavioral disturbance (HCC) 08/08/2019  . Acute renal failure superimposed on stage 3b chronic kidney disease (HCC) 08/08/2019  . Lactic acidosis 08/08/2019  . Incontinence 08/08/2019    Orientation RESPIRATION BLADDER Height & Weight        O2 (3L) Incontinent Weight: 120 lb 9.5 oz (54.7 kg) Height:  5\' 4"  (162.6 cm)  BEHAVIORAL SYMPTOMS/MOOD NEUROLOGICAL BOWEL NUTRITION STATUS      Incontinent Diet  AMBULATORY STATUS COMMUNICATION OF NEEDS Skin   Extensive Assist Verbally Normal                       Personal Care Assistance Level of Assistance  Bathing, Feeding, Dressing Bathing Assistance: Maximum assistance Feeding assistance: Maximum assistance Dressing Assistance: Maximum assistance     Functional Limitations  Info  Sight, Speech, Hearing Sight Info: Adequate Hearing Info: Adequate Speech Info: Adequate    SPECIAL CARE FACTORS FREQUENCY  OT (By licensed OT)       OT Frequency: 3x/week            Contractures      Additional Factors Info  Code Status, Allergies Code Status Info: DNR Allergies Info: Penicillins           Current Medications (05/01/2020):  This is the current hospital active medication list Current Facility-Administered Medications  Medication Dose Route Frequency Provider Last Rate Last Admin  . 0.9 %  sodium chloride infusion   Intravenous Continuous Robert Hurst, 07/01/2020, MD 50 mL/hr at 05/01/20 0300 Rate Verify at 05/01/20 0300  . apixaban (ELIQUIS) tablet 2.5 mg  2.5 mg Oral BID Robert Hurst, 07/01/20, MD   2.5 mg at 04/30/20 2052  . Chlorhexidine Gluconate Cloth 2 % PADS 6 each  6 each Topical Daily 2053, MD   6 each at 04/30/20 1000  . levothyroxine (SYNTHROID) tablet 50 mcg  50 mcg Oral Q0600 Robert Hurst, 06/30/20, MD   50 mcg at 05/01/20 0530  . memantine (NAMENDA) tablet 10 mg  10 mg Oral BID Robert Hurst, 07/01/20, MD   10 mg at 04/30/20 2050  . sodium chloride flush (NS) 0.9 % injection 10-40 mL  10-40 mL Intracatheter Q12H 03-15-1997, MD   10 mL at 04/30/20 2057  . sodium chloride flush (NS) 0.9 % injection 10-40 mL  10-40 mL  Intracatheter PRN Vassie Loll, MD      . Melene Muller ON 05/02/2020] vancomycin (VANCOREADY) IVPB 750 mg/150 mL  750 mg Intravenous Q48H Vassie Loll, MD      . vancomycin variable dose per unstable renal function (pharmacist dosing)   Does not apply See admin instructions Robert Hurst, Robert Roe, MD         Discharge Medications: Please see discharge summary for a list of discharge medications.  Relevant Imaging Results:  Relevant Lab Results:   Additional Information High fall risk  Robert Hurst, Robert China, LCSW

## 2020-05-01 NOTE — Evaluation (Signed)
Clinical/Bedside Swallow Evaluation Patient Details  Name: Robert Hurst MRN: [id number] Date of Birth: 1932/08/03  Today's Date: 05/01/2020 Time: SLP Start Time (ACUTE ONLY): 1417 SLP Stop Time (ACUTE ONLY): 1431 SLP Time Calculation (min) (ACUTE ONLY): 14 min  Past Medical History:  Past Medical History:  Diagnosis Date  . Alzheimer disease (HCC)   . Cognitive communication deficit   . COVID-19   . Dementia (HCC)   . DVT (deep venous thrombosis) (HCC)   . Dysphagia   . Malnourished (HCC)   . Protein calorie malnutrition (HCC)   . Renal disorder   . Thyroid disease    Past Surgical History:  Past Surgical History:  Procedure Laterality Date  . APPENDECTOMY    . BALLOON DILATION N/A 01/15/2018   Procedure: BALLOON DILATION;  Surgeon: Malissa Hippo, MD;  [facility];  Service: Endoscopy;  Laterality: N/A;  . ERCP N/A 01/15/2018   Procedure: ENDOSCOPIC RETROGRADE CHOLANGIOPANCREATOGRAPHY (ERCP);  Surgeon: Malissa Hippo, MD;  [facility];  Service: Endoscopy;  Laterality: N/A;  . REMOVAL OF STONES N/A 01/15/2018   Procedure: REMOVAL OF STONES;  Surgeon: Malissa Hippo, MD;  [facility];  Service: Endoscopy;  Laterality: N/A;  . SPHINCTEROTOMY N/A 01/15/2018   Procedure: SPHINCTEROTOMY;  Surgeon: Malissa Hippo, MD;  [facility];  Service: Endoscopy;  Laterality: N/A;  . TONSILLECTOMY     HPI:  86 y.o. male  with past medical history of Alzheimer's dementia, stroke, COVID-19 infection, CKD stage 3, history of DVT on Eliquis, hypothyroidism, dysphagia admitted from [facility] SNF on 04/29/2020 with lethargic, cold, clammy, hypothermic 96.1 F. Admitted with sepsis d/t UTI.   Assessment / Plan / Recommendation Clinical Impression  Clinical swallowing evaluation completed while Pt was sitting upright in bed; Oral mech reveals missing dentition. Pt consumed thin liquids, regular textures and jell-o without overt s/sx of oropharyngeal  dysphagia. Pt did demonstrate prolonged mastication and mild oral residue after the swallow. Recommend continue with ordered D2/fine chop diet and thin liquids; please ensure Pt is alert and seated upright for all PO. Meds are ok whole with liquids. There are no further ST needs noted at this time. SLP Visit Diagnosis: Dysphagia, unspecified (R13.10)    Aspiration Risk  Mild aspiration risk    Diet Recommendation Dysphagia 2 (Fine chop);Thin liquid   Liquid Administration via: Cup;Straw Medication Administration: Whole meds with liquid Supervision: Patient able to self feed;Intermittent supervision to cue for compensatory strategies Compensations: Minimize environmental distractions;Slow rate;Small sips/bites Postural Changes: Seated upright at 90 degrees;Remain upright for at least 30 minutes after po intake    Other  Recommendations Oral Care Recommendations: Oral care BID   Follow up Recommendations 24 hour supervision/assistance        Swallow Study   General Date of Onset: 04/29/20 HPI: 86 y.o. male  with past medical history of Alzheimer's dementia, stroke, COVID-19 infection, CKD stage 3, history of DVT on Eliquis, hypothyroidism, dysphagia admitted from [facility] SNF on 04/29/2020 with lethargic, cold, clammy, hypothermic 96.1 F. Admitted with sepsis d/t UTI. Type of Study: Bedside Swallow Evaluation Previous Swallow Assessment: none in chart Diet Prior to this Study: Dysphagia 2 (chopped);Thin liquids Temperature Spikes Noted: No Respiratory Status: Nasal cannula History of Recent Intubation: No Behavior/Cognition: Alert;Cooperative;Pleasant mood Oral Cavity Assessment: Dry Oral Care Completed by SLP: Recent completion by staff Oral Cavity - Dentition: Missing dentition Vision: Functional for self-feeding Self-Feeding Abilities: Able to feed self Patient Positioning: Upright in bed Baseline  Vocal Quality: Normal    Oral/Motor/Sensory Function Overall Oral Motor/Sensory  Function: Within functional limits   Ice Chips Ice chips: Within functional limits   Thin Liquid Thin Liquid: Within functional limits    Nectar Thick Nectar Thick Liquid: Not tested   Honey Thick Honey Thick Liquid: Not tested   Puree Puree: Within functional limits   Solid     Solid: Within functional limits     Robert Hurst H. Romie Levee, CCC-SLP Speech Language Pathologist  Georgetta Haber 05/01/2020,2:31 PM

## 2020-05-01 NOTE — Progress Notes (Signed)
PROGRESS NOTE    Robert Hurst  IBB:[id number] DOB: 30-Dec-1931 DOA: 04/29/2020 PCP: Neale Burly, MD    Chief Complaint  Patient presents with  . Altered Mental Status    Brief Narrative:   Robert Hurst  is a 86 y.o. male, with past medical history of Alzheimer's dementia, history of stroke COVID-19, history of DVT on Eliquis, hypothyroidism, patient is demented, nonverbal, unable to provide any history, history was obtained from ED staff and medical records, patient was sent by facility given he was lethargic, cold and clammy and nonverbal, sedated to check his rectal temperature this evening which was 96.1, glucose was done at the facility which was 178, patient with hospitalization in the past with similar presentation secondary to UTI, he had similar admission 01/2020 with acute encephalopathy secondary to UTI, where his urine culture growing Enterococcus, pansensitive. - in ED patient was hypotensive 89/53, but this did respond to fluid bolus, he was hypothermic 35.7 requiring Bair hugger, he was lethargic, his work-up was significant for elevated lactic acid at 6.4, elevated creatinine at 2.09, patient was started on IV vancomycin given his UA was positive, chest x-ray with no acute process, [facility] hospitalist consulted to admit.  Assessment & Plan: 1-severe sepsis: In the setting of Enterococcus faecalis UTI. -Patient met sepsis criteria at time of admission -Continue IV vancomycin and follow sensitivity -Continue supportive care -Continue IV fluids. -Follow clinical response.  2-acute on chronic renal failure in the setting of UTI, hypotension and dehydration -Minimize the use of nephrotoxic agent-continue IV fluid -Follow-up renal function trend. -Patient with stage IIIb renal failure at baseline.  3-hypothyroidism -Continue Synthroid at current dose -Repeat thyroid panel in 6-8 weeks.  4-acute metabolic encephalopathy -Secondary to acute infection along with underlying  uncontrolled thyroid disease. -continue synthroid -Consult reorientation  5-dementia -Continue home medications -Constant reorientation and supportive care.  6-hx of DVT -continue Eliquis.   DVT prophylaxis: Chronically on apixaban. Code Status: DNR/DNI Family Communication: Daughters and son updated over the phone on 04/30/2020. Disposition:   Status is: Inpatient  Dispo: The patient is from: Skilled nursing facility              Anticipated d/c is to: Skilled nursing facility              Anticipated d/c date is: To be determined (1 to 2 days).              Patient currently no medically stable for discharge; still requiring IV antibiotics and supportive care.  Slowly improving.       Consultants:   Palliative care.   Procedures:  See below for x-ray reports   Antimicrobials:  IV vancomycin 04/30/20   Subjective: Chronically ill and deconditioned; no chest pain, no nausea, no vomiting.  Currently afebrile.  No shortness of breath.  Objective: Vitals:   04/30/20 1334 04/30/20 1629 04/30/20 2043 05/01/20 0532  BP:   111/67 117/66  Pulse:   (!) 57 (!) 56  Resp:   17 17  Temp: (!) 96.8 F (36 C) 99.2 F (37.3 C) 98.1 F (36.7 C) 97.7 F (36.5 C)  TempSrc: Axillary Oral Oral Axillary  SpO2:   100% 97%  Weight:      Height:        Intake/Output Summary (Last 24 hours) at 05/01/2020 1504 Last data filed at 05/01/2020 0500 Gross per 24 hour  Intake 1003.72 ml  Output 500 ml  Net 503.72 ml   Filed Weights   04/29/20 2247  04/30/20 0630  Weight: 53 kg 54.7 kg    Examination: General exam: Appears calm and comfortable.  No fever, no nausea, no vomiting.  Reports to be slightly hungry.  Currently afebrile. Respiratory system: Good oxygen saturation on room air; no using accessory muscles.  No wheezing, no crackles. Cardiovascular system: S1 & S2 heard, no rubs, no gallops, no JVD on exam. Gastrointestinal system: Abdomen is nondistended, soft and nontender.  No organomegaly or masses felt. Normal bowel sounds heard. Central nervous system: No focal neurological deficits. Extremities: No cyanosis or clubbing. Skin: No rashes, no petechiae. Psychiatry: Mood & affect appropriate.  Following simple commands.   Data Reviewed: I have personally reviewed following labs and imaging studies  CBC: Recent Labs  Lab 04/30/20 0009 04/30/20 0902  WBC 8.4 8.7  HGB 17.3* 14.8  HCT 53.2* 44.0  MCV 91.1 88.7  PLT 192 629    Basic Metabolic Panel: Recent Labs  Lab 04/30/20 0009 04/30/20 0902 05/01/20 0635  NA 144 143 146*  K 4.4 4.5 4.4  CL 106 110 112*  CO2 22 21* 24  GLUCOSE 181* 130* 92  BUN 21 26* 26*  CREATININE 2.09* 1.92* 1.84*  CALCIUM 9.8 8.8* 8.7*    GFR: Estimated Creatinine Clearance: 22.3 mL/min (A) (by C-G formula based on SCr of 1.84 mg/dL (H)).  Liver Function Tests: Recent Labs  Lab 04/30/20 0009  AST 27  ALT 15  ALKPHOS 107  BILITOT 0.8  PROT 8.8*  ALBUMIN 4.3    CBG: No results for input(s): GLUCAP in the last 168 hours.   Recent Results (from the past 240 hour(s))  SARS Coronavirus 2 by RT PCR (hospital order, performed in [facility] hospital lab) Nasopharyngeal Nasopharyngeal Swab     Status: None   Collection Time: 04/29/20 11:50 PM   Specimen: Nasopharyngeal Swab  Result Value Ref Range Status   SARS Coronavirus 2 NEGATIVE NEGATIVE Final    Comment: (NOTE) SARS-CoV-2 target nucleic acids are NOT DETECTED.  The SARS-CoV-2 RNA is generally detectable in upper and lower respiratory specimens during the acute phase of infection. The lowest concentration of SARS-CoV-2 viral copies this assay can detect is 250 copies / mL. A negative result does not preclude SARS-CoV-2 infection and should not be used as the sole basis for treatment or other patient management decisions.  A negative result may occur with improper specimen collection / handling, submission of specimen other than nasopharyngeal swab,  presence of viral mutation(s) within the areas targeted by this assay, and inadequate number of viral copies (<250 copies / mL). A negative result must be combined with clinical observations, patient history, and epidemiological information.  Fact Sheet for Patients:   StrictlyIdeas.no  Fact Sheet for Healthcare Providers: BankingDealers.co.za  This test is not yet approved or  cleared by the [place] FDA and has been authorized for detection and/or diagnosis of SARS-CoV-2 by FDA under an Emergency Use Authorization (EUA).  This EUA will remain in effect (meaning this test can be used) for the duration of the COVID-19 declaration under Section 564(b)(1) of the Act, 21 U.S.C. section 360bbb-3(b)(1), unless the authorization is terminated or revoked sooner.  Performed at [facility], [address] [place], [place] [address]   Urine culture     Status: Abnormal (Preliminary result)   Collection Time: 04/30/20  1:45 AM   Specimen: In/Out Cath Urine  Result Value Ref Range Status   Specimen Description   Final    IN/OUT CATH URINE Performed at  [facility], [address], [place], [place] [address]    Special Requests   Final    NONE Performed at [facility], [address] [place], [place] [address]    Culture (A)  Final    >=100,000 COLONIES/mL ENTEROCOCCUS FAECALIS SUSCEPTIBILITIES TO FOLLOW Performed at [facility], [place] [address], [place], [place] [address]    Report Status PENDING  Incomplete     Radiology Studies: DG Chest Port 1 View  Result Date: 04/30/2020 CLINICAL DATA:  Sepsis EXAM: PORTABLE CHEST 1 VIEW COMPARISON:  Feb 12, 2020 FINDINGS: The heart size and mediastinal contours are within normal limits. Aortic knob calcifications are seen. Both lungs are clear. The visualized skeletal structures are unremarkable. IMPRESSION: No active disease. Electronically Signed   By: Prudencio Pair M.D.   On: 04/30/2020  00:05   [place] EKG SITE RITE  Result Date: 04/30/2020 If Site Rite image not attached, placement could not be confirmed due to current cardiac rhythm.  [place] EKG SITE RITE  Result Date: 04/30/2020 If Site Rite image not attached, placement could not be confirmed due to current cardiac rhythm.   Scheduled Meds: . apixaban  2.5 mg Oral BID  . Chlorhexidine Gluconate Cloth  6 each Topical Daily  . levothyroxine  50 mcg Oral Q0600  . memantine  10 mg Oral BID  . sodium chloride flush  10-40 mL Intracatheter Q12H  . vancomycin variable dose per unstable renal function (pharmacist dosing)   Does not apply See admin instructions   Continuous Infusions: . sodium chloride 50 mL/hr at 05/01/20 0300  . [START ON 05/02/2020] vancomycin       LOS: 1 day    Time spent: 35 minutes.   Barton Dubois, MD [facility]   To contact the attending provider between 7A-7P or the covering provider during after hours 7P-7A, please log into the web site [email or web address] and access using universal [place] password for that web site. If you do not have the password, please call the hospital operator.  05/01/2020, 3:04 PM

## 2020-05-01 NOTE — Evaluation (Signed)
Occupational Therapy Evaluation Patient Details Name: Robert Hurst MRN: [id number] DOB: [birth year] Today's Date: 05/01/2020    History of Present Illness 86 y.o. male  with past medical history of Alzheimer's dementia, stroke, COVID-19 infection, CKD stage 3, history of DVT on Eliquis, hypothyroidism, dysphagia admitted from [facility] on 04/29/2020 with lethargic, cold, clammy, hypothermic 96.1 F. Admitted with sepsis d/t UTI.   Clinical Impression   Pt sleeping and in and out of awareness during evaluation. Required Total assist with basic ADL requests such as washing his face; even with washcloth placed in his hand. He did complete A/ROM shoulder movements and participate in BUE MMT when given verbal and visual cues. Prior functional performance unknown as patient was unable to provide any background information or communicate effectively with therapist. At recent hospitalization in May 2021, social work noted that patient received Max Assist for ADL tasks. Patient demonstrates need for more than Max Assistance this date. Recommend skilled OT services to follow patient acutely and discharge back to [place] facility to be evaluated by staff OT and determine need for continued therapy services.     Follow Up Recommendations  SNF    Equipment Recommendations  None recommended by OT       Precautions / Restrictions Precautions Precautions: Fall Restrictions Weight Bearing Restrictions: No      Mobility Bed Mobility       General bed mobility comments: Bed mobility not tested due to arousal level.  Transfers     General transfer comment: Transfer not completed.        ADL either performed or assessed with clinical judgement   ADL Overall ADL's : Needs assistance/impaired Eating/Feeding: Maximal assistance;Sitting   Grooming: Wash/dry face;Wash/dry hands;Total assistance;Sitting   Upper Body Bathing: Total assistance;Sitting   Lower Body Bathing: Total  assistance;Sitting/lateral leans;Bed level   Upper Body Dressing : Total assistance;Bed level;Sitting   Lower Body Dressing: Total assistance;Sitting/lateral leans;Bed level                       Vision Baseline Vision/History:  (Unknown) Patient Visual Report:  (N/A) Vision Assessment?:  (N/T)            Pertinent Vitals/Pain Pain Assessment: Faces Faces Pain Scale: No hurt     Hand Dominance Right   Extremity/Trunk Assessment Upper Extremity Assessment Upper Extremity Assessment: Generalized weakness;LUE deficits/detail LUE Deficits / Details: Decreased ROM and strength in LUE. This may be from history of CVA.   Lower Extremity Assessment Lower Extremity Assessment: Defer to PT evaluation       Communication Communication Communication: Expressive difficulties;Other (comment) (garbled speech. Unable to clearly communicate.)   Cognition Arousal/Alertness: Lethargic Behavior During Therapy: Flat affect Overall Cognitive Status: No family/caregiver present to determine baseline cognitive functioning     General Comments: Pt attempted to answer questions from therapist although unable to clearly communicate. Difficutly with following directions regarding washing his face. Did complete ROM movements when provided visual demonstration              Home Living Family/patient expects to be discharged to:: Skilled nursing facility     Additional Comments: Patient is a resident at [facility]. Unable to provide any background information regarding care. Any information obtained from chart.      Prior Functioning/Environment Level of Independence: Needs assistance  Gait / Transfers Assistance Needed: assisted household ambulator using RW ADL's / Homemaking Assistance Needed: assisted by SNF staff  OT Problem List: Decreased strength;Decreased coordination;Decreased range of motion;Decreased activity tolerance;Impaired balance (sitting and/or  standing);Decreased safety awareness      OT Treatment/Interventions: Self-care/ADL training;Manual therapy;Visual/perceptual remediation/compensation;Therapeutic exercise;Patient/family education;Neuromuscular education;Balance training;Energy conservation;Therapeutic activities;DME and/or AE instruction    OT Goals(Current goals can be found in the care plan section) Acute Rehab OT Goals Patient Stated Goal: None stated OT Goal Formulation: Patient unable to participate in goal setting Time For Goal Achievement: 05/15/20 Potential to Achieve Goals: Fair  OT Frequency: Min 2X/week    AM-PAC OT "6 Clicks" Daily Activity     Outcome Measure Help from another person eating meals?: Total Help from another person taking care of personal grooming?: Total Help from another person toileting, which includes using toliet, bedpan, or urinal?: Total Help from another person bathing (including washing, rinsing, drying)?: Total Help from another person to put on and taking off regular upper body clothing?: Total Help from another person to put on and taking off regular lower body clothing?: Total 6 Click Score: 6   End of Session Equipment Utilized During Treatment: Oxygen  Activity Tolerance: Patient limited by fatigue Patient left: in bed;with call bell/phone within reach;with bed alarm set  OT Visit Diagnosis: Repeated falls (R29.6);Muscle weakness (generalized) (M62.81)                Time: 1884-1660 OT Time Calculation (min): 18 min Charges:  OT General Charges $OT Visit: 1 Visit OT Evaluation $OT Eval Low Complexity: 1 Low OT Treatments $Self Care/Home Management : 8-22 mins  Limmie Patricia, OTR/L,CBIS  [phone]   Ivylynn Hoppes, Charisse March 05/01/2020, 12:34 PM

## 2020-05-01 NOTE — Progress Notes (Signed)
   05/01/20 1821  Assess: MEWS Score  Temp 98.5 F (36.9 C)  BP 97/68  Pulse Rate (!) 46  Resp 18  SpO2 96 %  O2 Device Room Air  Assess: MEWS Score  MEWS Temp 0  MEWS Systolic 1  MEWS Pulse 1  MEWS RR 0  MEWS LOC 0  MEWS Score 2  MEWS Score Color Yellow  Assess: if the MEWS score is Yellow or Red  Were vital signs taken at a resting state? Yes  Focused Assessment No change from prior assessment  Early Detection of Sepsis Score *See Row Information* Low  MEWS guidelines implemented *See Row Information* No, previously yellow, continue vital signs every 4 hours (MD made aware )  Treat  MEWS Interventions Other (Comment) (no new orders at this time )  Pain Scale 0-10  Pain Score 0  Take Vital Signs  Increase Vital Sign Frequency  Yellow: Q 2hr X 2 then Q 4hr X 2, if remains yellow, continue Q 4hrs  Escalate  MEWS: Escalate Yellow: discuss with charge nurse/RN and consider discussing with provider and RRT  Notify: Charge Nurse/RN  Name of Charge Nurse/RN Notified MaryAnn RN  Date Charge Nurse/RN Notified 05/01/20  Time Charge Nurse/RN Notified 1835  Notify: Provider  Provider Name/Title Gwenlyn Perking   Date Provider Notified 05/01/20  Time Provider Notified 1836  Notification Reason Other (Comment) (Mews Score yellow )

## 2020-05-01 NOTE — Plan of Care (Signed)
  Problem: Acute Rehab OT Goals (only OT should resolve) Goal: Pt. Will Perform Grooming Flowsheets (Taken 05/01/2020 1240) Pt Will Perform Grooming:  with max assist  sitting Goal: Pt. Will Perform Upper Body Bathing Flowsheets (Taken 05/01/2020 1240) Pt Will Perform Upper Body Bathing:  with max assist  sitting Goal: Pt. Will Perform Upper Body Dressing Flowsheets (Taken 05/01/2020 1242) Pt Will Perform Upper Body Dressing:  with max assist  sitting Goal: Pt. Will Transfer To Toilet Flowsheets (Taken 05/01/2020 1242) Pt Will Transfer to Toilet:  with max assist  bedside commode  stand pivot transfer Goal: Pt. Will Perform Toileting-Clothing Manipulation Flowsheets (Taken 05/01/2020 1242) Pt Will Perform Toileting - Clothing Manipulation and hygiene:  with max assist  sitting/lateral leans  sit to/from stand Goal: OT Additional ADL Goal #1 Flowsheets (Taken 05/01/2020 1242) Additional ADL Goal #1: Pt will increase sitting activity tolerance to 5-10 minutes while completing a simple ADL task at bedside.

## 2020-05-01 NOTE — Progress Notes (Signed)
Pharmacy Antibiotic Note  Robert Hurst is a 86 y.o. male admitted on 04/29/2020 with sepsis and UTI.  Pharmacy has been consulted for Vancomycin dosing.  Plan: Vancomycin 1000 mg IV x 1 dose. Vancomycin 750 mg IV every 48 hours. Monitor labs, c/s, and vanco level as indicated.  Height: 5\' 4"  (162.6 cm) Weight: 54.7 kg (120 lb 9.5 oz) IBW/kg (Calculated) : 59.2  Temp (24hrs), Avg:98.3 F (36.8 C), Min:97.7 F (36.5 C), Max:99.2 F (37.3 C)  Recent Labs  Lab 04/30/20 0009 04/30/20 0902 05/01/20 0635  WBC 8.4 8.7  --   CREATININE 2.09* 1.92* 1.84*  LATICACIDVEN 6.4*  --   --     Estimated Creatinine Clearance: 22.3 mL/min (A) (by C-G formula based on SCr of 1.84 mg/dL (H)).    Allergies  Allergen Reactions  . Penicillins     Has patient had a PCN reaction causing immediate rash, facial/tongue/throat swelling, SOB or lightheadedness with hypotension: Unknown Has patient had a PCN reaction causing severe rash involving mucus membranes or skin necrosis: Unknown Has patient had a PCN reaction that required hospitalization: Unknown Has patient had a PCN reaction occurring within the last 10 years: Unknown If all of the above answers are "NO", then may proceed with Cephalosporin use.     Antimicrobials this admission: Vanco 8/8 >>      Microbiology results: 8/8 BCx: pending 8/8 UCx: enterococcus faecalis    Thank you for allowing pharmacy to be a part of this patient's care.  10/8 05/01/2020 1:40 PM

## 2020-05-01 NOTE — Consult Note (Signed)
Consultation Note Date: 05/01/2020   Patient Name: Robert Hurst  DOB: [birth year]  MRN: [id number]  Age / Sex: 86 y.o., male  PCP: Neale Burly, MD Referring Physician: Barton Dubois, MD  Reason for Consultation: Establishing goals of care  HPI/Patient Profile: 86 y.o. male  with past medical history of Alzheimer's dementia, stroke, COVID-19 infection, CKD stage 3, history of DVT on Eliquis, hypothyroidism, dysphagia admitted from [facility] SNF on 09/27/5206 with lethargic, cold, clammy, hypothermic 96.1 F. Admitted with sepsis d/t UTI. I have had palliative discussions with daughter, [name] previously 08/16/19 and 08/17/19 and plan was for palliative to follow and consideration of addition of hospice in the future.   Clinical Assessment and Goals of Care: I met today with Robert Hurst but no family/visitors at bedside. He is lethargic but briefly opens eyes but only for a few seconds. He nods his head no to pain but only after asked and then specifically prompted to nod head to answer. He did not answer any other questions. He does not interact any further.  I called and spoke with both his daughters, [name] and [name]. I spoke with [name] this past November and she recalls this conversation. Family understand our concern but note that when he returns to nursing facility he is always interactive with them and eats 100% of any food they bring and eats much when they take him out. We question if the modified diet and food taste limits him as they report that he ate fried chicken recently without any issues. They even report that he gave [name] a list of snack foods and she provided these for him at facility although nobody helps to provide him with these.   Family understand that his cognitive dysfunction is what triggers his decreased intake, risk of infection/UTIs, risk of aspiration and need for modified diet. They are  interested in liberalizing diet accepting risks of aspiration. They are hopeful that he will improve as he has in the past. They understand that he will continue to have complications and decline but they have seen him repeatedly improve more than anticipated. He has even gone to hospice facility for end of life and improved and went to nursing facility. Given his unexpected improvement in the past family are confused as to what to really expect. They hope for the best. However, we were able to discuss that one of these times he will not improve as good as before. These infections/complications are likely to become more and more frequent and he will experience more gradual declines and potentially more significant decline with each infection/complication. They understand. They are open to comfort and hospice but so far he has improved enough to have meaningful visits with his family.   All questions/concerns addressed. Emotional support provided.   Primary Decision Maker NEXT OF KIN adult children    SUMMARY OF RECOMMENDATIONS   Hopeful for some level of improvement. MOST form to be left for family to review.   Code Status/Advance Care Planning:  DNR   Symptom Management:  Frequent UTI: Family believe that he does have BPH and may benefit from flomax (they believe he was on this previously).   Abd distended: Bladder scan to rule out urinary retention. Senokot 1 tablet qhs for constipation.   Palliative Prophylaxis:   Aspiration, Bowel Regimen, Delirium Protocol, Frequent Pain Assessment, Oral Care and Turn Reposition  Psycho-social/Spiritual:   Desire for further Chaplaincy support:no  Additional Recommendations: Caregiving  Support/Resources and Education on Hospice  Prognosis:   Overall prognosis poor with underlying dementia and recurrent UTIs.  Discharge Planning: [place] for rehab with Palliative care service follow-up      Primary Diagnoses: Present  on Admission: . Acute cystitis without hematuria . Acute metabolic encephalopathy . Acute renal failure superimposed on stage 3b chronic kidney disease ([place]) . Dementia without behavioral disturbance ([place]) . Hypothyroidism . UTI (urinary tract infection)   I have reviewed the medical record, interviewed the patient and family, and examined the patient. The following aspects are pertinent.  Past Medical History:  Diagnosis Date  . Alzheimer disease ([place])   . Cognitive communication deficit   . COVID-19   . Dementia ([place])   . DVT (deep venous thrombosis) ([place])   . Dysphagia   . Malnourished ([place])   . Protein calorie malnutrition ([place])   . Renal disorder   . Thyroid disease    Social History   Socioeconomic History  . Marital status: Widowed    Spouse name: Not on file  . Number of children: Not on file  . Years of education: Not on file  . Highest education level: Not on file  Occupational History  . Not on file  Tobacco Use  . Smoking status: Never Smoker  . Smokeless tobacco: Never Used  Vaping Use  . Vaping Use: Unknown  Substance and Sexual Activity  . Alcohol use: Not Currently  . Drug use: Not Currently  . Sexual activity: Not Currently  Other Topics Concern  . Not on file  Social History Narrative  . Not on file   Social Determinants of Health   Financial Resource Strain:   . Difficulty of Paying Living Expenses:   Food Insecurity:   . Worried About Charity fundraiser in the Last Year:   . Arboriculturist in the Last Year:   Transportation Needs:   . Film/video editor (Medical):   Marland Kitchen Lack of Transportation (Non-Medical):   Physical Activity:   . Days of Exercise per Week:   . Minutes of Exercise per Session:   Stress:   . Feeling of Stress :   Social Connections:   . Frequency of Communication with Friends and Family:   . Frequency of Social Gatherings with Friends and Family:   . Attends Religious Services:   . Active Member of Clubs or  Organizations:   . Attends Archivist Meetings:   Marland Kitchen Marital Status:    History reviewed. No pertinent family history. Scheduled Meds: . apixaban  2.5 mg Oral BID  . Chlorhexidine Gluconate Cloth  6 each Topical Daily  . levothyroxine  50 mcg Oral Q0600  . memantine  10 mg Oral BID  . sodium chloride flush  10-40 mL Intracatheter Q12H  . vancomycin variable dose per unstable renal function (pharmacist dosing)   Does not apply See admin instructions   Continuous Infusions: . sodium chloride 50 mL/hr at 05/01/20 0300   PRN Meds:.sodium chloride flush Allergies  Allergen Reactions  . Penicillins     Has  patient had a PCN reaction causing immediate rash, facial/tongue/throat swelling, SOB or lightheadedness with hypotension: Unknown Has patient had a PCN reaction causing severe rash involving mucus membranes or skin necrosis: Unknown Has patient had a PCN reaction that required hospitalization: Unknown Has patient had a PCN reaction occurring within the last 10 years: Unknown If all of the above answers are "NO", then may proceed with Cephalosporin use.    Review of Systems  Unable to perform ROS: Dementia    Physical Exam Vitals and nursing note reviewed.  Constitutional:      General: He is sleeping. He is not in acute distress.    Appearance: He is ill-appearing.     Comments: Frail, thin   Cardiovascular:     Rate and Rhythm: Bradycardia present.  Pulmonary:     Effort: Pulmonary effort is normal. No tachypnea, accessory muscle usage or respiratory distress.  Abdominal:     General: There is distension.     Tenderness: There is no abdominal tenderness.  Neurological:     Comments: Opens eyes for only a couple seconds; no verbal response; nods head no when asked about pain after prompting to nod head but unable to get head nod response to any other questions     Vital Signs: BP 117/66 (BP Location: Right Arm)   Pulse (!) 56   Temp 97.7 F (36.5 C)  (Axillary)   Resp 17   Ht '5\' 4"'  (1.626 m)   Wt 54.7 kg   SpO2 97%   BMI 20.70 kg/m  Pain Scale: 0-10   Pain Score: 0-No pain   SpO2: SpO2: 97 % O2 Device:SpO2: 97 % O2 Flow Rate: .O2 Flow Rate (L/min): 3 L/min  IO: Intake/output summary:   Intake/Output Summary (Last 24 hours) at 05/01/2020 0857 Last data filed at 05/01/2020 0500 Gross per 24 hour  Intake 1003.72 ml  Output 500 ml  Net 503.72 ml    LBM: Last BM Date: 04/30/20 Baseline Weight: Weight: 53 kg Most recent weight: Weight: 54.7 kg     Palliative Assessment/Data:     Time In: 1530 Time Out: 1630 Time Total: 60 min Greater than 50%  of this time was spent counseling and coordinating care related to the above assessment and plan.  Signed by: Vinie Sill, NP Palliative Medicine Team Pager # [phone] (M-F 8a-5p) Team Phone # [phone] (Nights/Weekends)

## 2020-05-01 NOTE — TOC Initial Note (Signed)
Transition of Care [facility]) - Initial/Assessment Note    Patient Details  Name: Robert Hurst MRN: [id number] Date of Birth: November 10, 1931  [facility] of [facility] CM/SW Contact:    Annice Needy, LCSW Phone Number: 05/01/2020, 1:03 PM  Clinical Narrative:                 Pt from [facility] admitted for altered mental status. Pt has alzheimer's dementia and is disoriented x4.  Patientt has been resident at [facility] for 4 years.                Expected Discharge Plan: Skilled Nursing Facility Barriers to Discharge: Continued Medical Work up   Patient Goals and CMS Choice        Expected Discharge Plan and Services Expected Discharge Plan: Skilled Nursing Facility       Living arrangements for the past 2 months: Skilled Nursing Facility                                      Prior Living Arrangements/Services Living arrangements for the past 2 months: Skilled Nursing Facility Lives with:: Facility Resident Patient language and need for interpreter reviewed:: Yes Do you feel safe going back to the place where you live?: Yes      Need for Family Participation in Patient Care: Yes (Comment) Care giver support system in place?: Yes (comment)   Criminal Activity/Legal Involvement Pertinent to Current Situation/Hospitalization: No - Comment as needed  Activities of Daily Living      Permission Sought/Granted                  Emotional Assessment     Affect (typically observed): Unable to Assess   Alcohol / Substance Use: Not Applicable Psych Involvement: No (comment)  Admission diagnosis:  Lactic acidosis [E87.2] Somnolence [R40.0] UTI (urinary tract infection) [N39.0] Acute on chronic renal insufficiency [N28.9, N18.9] Hypothermia, initial encounter [T68.XXXA] Urinary tract infection with hematuria, site unspecified [N39.0, R31.9] Hypothyroidism, unspecified type [E03.9] Hypotension due to hypovolemia [I95.89, E86.1] Patient Active Problem List    Diagnosis Date Noted  . UTI (urinary tract infection) 04/30/2020  . CKD (chronic kidney disease), stage III 02/12/2020  . Hypothyroidism 02/12/2020  . History of DVT (deep vein thrombosis)   . History of COVID-19   . Acute cystitis without hematuria   . Goals of care, counseling/discussion   . Palliative care encounter   . Acute metabolic encephalopathy 08/11/2019  . Syncope and collapse 08/11/2019  . Syncope   . Dementia without behavioral disturbance (HCC) 08/08/2019  . Acute renal failure superimposed on stage 3b chronic kidney disease (HCC) 08/08/2019  . Lactic acidosis 08/08/2019  . Incontinence 08/08/2019   PCP:  [name], MD Pharmacy:   [address] [place], [place] - [phone] [place] [address] Phone: [phone] Fax: [phone]     Social Determinants of Health (SDOH) Interventions    Readmission Risk Interventions No flowsheet data found.

## 2020-05-02 LAB — CBC
HCT: 39.1 % (ref 39.0–52.0)
Hemoglobin: 13 g/dL (ref 13.0–17.0)
MCH: 30.6 pg (ref 26.0–34.0)
MCHC: 33.2 g/dL (ref 30.0–36.0)
MCV: 92 fL (ref 80.0–100.0)
Platelets: 116 10*3/uL — ABNORMAL LOW (ref 150–400)
RBC: 4.25 MIL/uL (ref 4.22–5.81)
RDW: 15.5 % (ref 11.5–15.5)
WBC: 5.5 10*3/uL (ref 4.0–10.5)
nRBC: 0 % (ref 0.0–0.2)

## 2020-05-02 LAB — URINE CULTURE: Culture: >=100000 — AB

## 2020-05-02 LAB — BASIC METABOLIC PANEL
Anion gap: 8 (ref 5–15)
BUN: 21 mg/dL (ref 8–23)
CO2: 24 mmol/L (ref 22–32)
Calcium: 8.4 mg/dL — ABNORMAL LOW (ref 8.9–10.3)
Chloride: 112 mmol/L — ABNORMAL HIGH (ref 98–111)
Creatinine, Ser: 1.33 mg/dL — ABNORMAL HIGH (ref 0.61–1.24)
GFR calc Af Amer: 56 mL/min — ABNORMAL LOW (ref >60)
GFR calc non Af Amer: 48 mL/min — ABNORMAL LOW (ref >60)
Glucose, Bld: 79 mg/dL (ref 70–99)
Potassium: 3.6 mmol/L (ref 3.5–5.1)
Sodium: 144 mmol/L (ref 135–145)

## 2020-05-02 LAB — MAGNESIUM: Magnesium: 1.8 mg/dL (ref 1.7–2.4)

## 2020-05-02 NOTE — Evaluation (Signed)
Physical Therapy Evaluation Patient Details Name: Robert Hurst MRN: [id number] DOB: 1931/12/01 Today's Date: 05/02/2020   History of Present Illness  86 y.o. male  with past medical history of Alzheimer's dementia, stroke, COVID-19 infection, CKD stage 3, history of DVT on Eliquis, hypothyroidism, dysphagia admitted from [facility] SNF on 04/29/2020 with lethargic, cold, clammy, hypothermic 96.1 F. Admitted with sepsis d/t UTI.  Clinical Impression  Pt admitted with above diagnosis. Pt able to follow 50% of commands this session, increased difficulty cueing pt to assist with bed mobility and transfers despite therapist assisting with proper hand placement. Pt unable to fully rise to standing with RW and assist from 2 therapists. Pt perseverates on wanting orange juice to drink during session. Pt pleasantly confused, reoriented to task at hand and situation during session with poor carryover. No family in room and pt unable to recall PLOF; provided RW and pt nods head "no" to using at nursing home, also reports he gets out of bed to go to [facility] but that is all. Pt currently with functional limitations due to the deficits listed below (see PT Problem List). Pt will benefit from skilled PT to increase their independence and safety with mobility to allow discharge to the venue listed below.       Follow Up Recommendations SNF    Equipment Recommendations  None recommended by PT    Recommendations for Other Services       Precautions / Restrictions Precautions Precautions: Fall Restrictions Weight Bearing Restrictions: No      Mobility  Bed Mobility Overal bed mobility: Needs Assistance Bed Mobility: Supine to Sit;Sit to Supine  Supine to sit: Max assist;+2 for physical assistance Sit to supine: Max assist;+2 for physical assistance   General bed mobility comments: Pt reaching for clinicians however little to no effort on pt's part to participate, unable to cue to assist with  mobility  Transfers Overall transfer level: Needs assistance Equipment used: Rolling walker (2 wheeled) Transfers: Sit to/from Stand Sit to Stand: Max assist;+2 physical assistance  General transfer comment: unable to cue pt to assist with transfer, unable to fully extend hips/knees and upright trunk, provided RW but unable to use appropriately  Ambulation/Gait  General Gait Details: unable  Stairs            Wheelchair Mobility    Modified Rankin (Stroke Patients Only)       Balance Overall balance assessment: Needs assistance Sitting-balance support: Feet supported;Bilateral upper extremity supported Sitting balance-Leahy Scale: Fair Sitting balance - Comments: seated EOB   Standing balance support: During functional activity;Bilateral upper extremity supported Standing balance-Leahy Scale: Zero            Pertinent Vitals/Pain Pain Assessment: No/denies pain    Home Living Family/patient expects to be discharged to:: Skilled nursing facility                 Additional Comments: Patient is a resident at [facility]. Unable to provide any background information regarding care; information obtained from chart.    Prior Function Level of Independence: Needs assistance   Gait / Transfers Assistance Needed: assisted household ambulator using RW- pt nods head "no" to use of RW at SNF  ADL's / Homemaking Assistance Needed: assisted by SNF staff        Hand Dominance   Dominant Hand: Right    Extremity/Trunk Assessment   Upper Extremity Assessment Upper Extremity Assessment: Defer to OT evaluation LUE Deficits / Details: Decreased ROM and strength in LUE. This  may be from history of CVA.    Lower Extremity Assessment Lower Extremity Assessment: Generalized weakness (AROM ~50%, strength 2+/5 bilaterally, unable to MMT due to cognition)    Cervical / Trunk Assessment Cervical / Trunk Assessment: Normal  Communication   Communication: Other  (comment) (50% appropriate with responses to therapist's questions/commands)  Cognition Arousal/Alertness: Awake/alert Behavior During Therapy: Flat affect Overall Cognitive Status: No family/caregiver present to determine baseline cognitive functioning  General Comments: Pt answering questions appropriately ~50% of trials. Pt requesting orange juice often during session. Pt reports the year is 1935, he is 86YO, the president is Kyung Rudd, and he is in [place] [facility]; oriented pt but poor carryover.      General Comments      Exercises     Assessment/Plan    PT Assessment Patient needs continued PT services  PT Problem List Decreased strength;Decreased range of motion;Decreased activity tolerance;Decreased balance;Decreased mobility;Decreased knowledge of use of DME;Decreased safety awareness       PT Treatment Interventions DME instruction;Gait training;Functional mobility training;Therapeutic activities;Therapeutic exercise;Balance training;Neuromuscular re-education;Cognitive remediation;Patient/family education    PT Goals (Current goals can be found in the Care Plan section)  Acute Rehab PT Goals Patient Stated Goal: None stated PT Goal Formulation: With patient Time For Goal Achievement: 05/16/20 Potential to Achieve Goals: Good    Frequency Min 2X/week   Barriers to discharge        Co-evaluation PT/OT/SLP Co-Evaluation/Treatment: Yes Reason for Co-Treatment: Complexity of the patient's impairments (multi-system involvement);To address functional/ADL transfers PT goals addressed during session: Mobility/safety with mobility;Balance;Proper use of DME OT goals addressed during session: ADL's and self-care       AM-PAC PT "6 Clicks" Mobility  Outcome Measure Help needed turning from your back to your side while in a flat bed without using bedrails?: A Lot Help needed moving from lying on your back to sitting on the side of a flat bed without using bedrails?: A  Lot Help needed moving to and from a bed to a chair (including a wheelchair)?: Total Help needed standing up from a chair using your arms (e.g., wheelchair or bedside chair)?: Total Help needed to walk in hospital room?: Total Help needed climbing 3-5 steps with a railing? : Total 6 Click Score: 8    End of Session Equipment Utilized During Treatment: Gait belt Activity Tolerance: Patient tolerated treatment well Patient left: in bed;with call bell/phone within reach;with bed alarm set Nurse Communication: Mobility status;Other (comment) (O2) PT Visit Diagnosis: Unsteadiness on feet (R26.81);Other abnormalities of gait and mobility (R26.89);Muscle weakness (generalized) (M62.81)    Time: 4818-5631 PT Time Calculation (min) (ACUTE ONLY): 34 min   Charges:   PT Evaluation $PT Eval Moderate Complexity: 1 Mod           Tori Nayelie Gionfriddo PT, DPT 05/02/20, 12:38 PM [phone]

## 2020-05-02 NOTE — Progress Notes (Signed)
Palliative:  I have come by hospital room but no family at bedside. Family voiced desire to liberalize diet to regular diet yesterday. Discussed risk of aspiration and they are still interested in liberalizing diet. They also question possible history of BPH and need for medication to assist. MOST and Hard Choices left at bedside for family to review. I will reach out to them tomorrow to continue conversation.   No charge  Yong Channel, NP Palliative Medicine Team Pager [phone] (Please see [email or web address] for schedule) Team Phone [phone]

## 2020-05-02 NOTE — Progress Notes (Signed)
PROGRESS NOTE    Robert Hurst  OBS:[id number] DOB: Aug 20, 1932 DOA: 04/29/2020 PCP: Neale Burly, MD    Chief Complaint  Patient presents with  . Altered Mental Status    Brief Narrative:   Robert Hurst  is a 86 y.o. male, with past medical history of Alzheimer's dementia, history of stroke COVID-19, history of DVT on Eliquis, hypothyroidism, patient is demented, nonverbal, unable to provide any history, history was obtained from ED staff and medical records, patient was sent by facility given he was lethargic, cold and clammy and nonverbal, sedated to check his rectal temperature this evening which was 96.1, glucose was done at the facility which was 178, patient with hospitalization in the past with similar presentation secondary to UTI, he had similar admission 01/2020 with acute encephalopathy secondary to UTI, where his urine culture growing Enterococcus, pansensitive. - in ED patient was hypotensive 89/53, but this did respond to fluid bolus, he was hypothermic 35.7 requiring Bair hugger, he was lethargic, his work-up was significant for elevated lactic acid at 6.4, elevated creatinine at 2.09, patient was started on IV vancomycin given his UA was positive, chest x-ray with no acute process, [facility] hospitalist consulted to admit.  Assessment & Plan: 1-severe sepsis: In the setting of Enterococcus faecalis UTI. -Patient met sepsis criteria at time of admission -Continue IV vancomycin for now; sensitivity demonstrating microorganism to be sensitive to ampicillin, nitrofurantoin and vancomycin. -Continue supportive care. -Patient will go back to skilled nursing facility for further care and rehabilitation at discharge. -Continue IV fluids. -Follow clinical response.  2-acute on chronic renal failure in the setting of UTI, hypotension and dehydration -Minimize the use of nephrotoxic agent -continue IV fluid -Follow-up renal function trend. -Patient with stage IIIb renal failure at  baseline. -Creatinine continues to downtrend appropriately; reaching almost baseline levels.  3-hypothyroidism -Continue Synthroid at current dose -Repeat thyroid panel in 6-8 weeks.  4-acute metabolic encephalopathy -Secondary to acute infection along with underlying uncontrolled thyroid disease. -continue synthroid -Consult reorientation  5-dementia -Continue home medications -Constant reorientation and supportive care. -Appreciate evaluation by speech therapy who has recommended dysphagia 2 diet with nectar thick liquids.  6-hx of DVT -continue Eliquis.   DVT prophylaxis: Chronically on apixaban. Code Status: DNR/DNI Family Communication: Daughters and son updated over the phone on 04/30/2020. Disposition:   Status is: Inpatient  Dispo: The patient is from: Skilled nursing facility              Anticipated d/c is to: Skilled nursing facility              Anticipated d/c date is: To be determined (1 to 2 days).              Patient currently no medically stable for discharge; still requiring IV antibiotics and supportive care.  Slowly improving.       Consultants:   Palliative care.   Procedures:  See below for x-ray reports   Antimicrobials:  IV vancomycin 04/30/20   Subjective: Chronically ill, underweight and deconditioned on examination.  Reports no chest pain and is currently afebrile.  Intermittent episode of nonsustained bradycardia appreciated; no overnight events.  Objective: Vitals:   05/01/20 2031 05/01/20 2134 05/02/20 0351 05/02/20 1457  BP:  119/67 118/62 111/67  Pulse:  (!) 52 (!) 52 (!) 51  Resp:  17    Temp:  98.4 F (36.9 C) 98.5 F (36.9 C) 98.9 F (37.2 C)  TempSrc:  Oral Oral Oral  SpO2: 99% 99% 100%  100%  Weight:      Height:        Intake/Output Summary (Last 24 hours) at 05/02/2020 1617 Last data filed at 05/02/2020 1024 Gross per 24 hour  Intake 130 ml  Output 1800 ml  Net -1670 ml   Filed Weights   04/29/20 2247  04/30/20 0630  Weight: 53 kg 54.7 kg    Examination: General exam: Alert, awake, oriented x 1, following simple commands and tolerating nectar thick liquids with dysphagia 2 diet.  No chest pain, no nausea, no vomiting.  Currently afebrile.  Responded well to treatment.  In no acute distress.  No overnight events reported. Respiratory system: Clear to auscultation. Respiratory effort normal.  No using accessory muscle.  No requiring oxygen supplementation. Cardiovascular system:RRR. No murmurs, rubs, gallops. Gastrointestinal system: Abdomen is nondistended, soft and nontender. No organomegaly or masses felt. Normal bowel sounds heard. Central nervous system: No focal neurological deficits. Extremities: No cyanosis or clubbing. Skin: No rashes, no petechiae. Psychiatry: Mood & affect appropriate.     Data Reviewed: I have personally reviewed following labs and imaging studies  CBC: Recent Labs  Lab 04/30/20 0009 04/30/20 0902 05/02/20 0424  WBC 8.4 8.7 5.5  HGB 17.3* 14.8 13.0  HCT 53.2* 44.0 39.1  MCV 91.1 88.7 92.0  PLT 192 160 116*    Basic Metabolic Panel: Recent Labs  Lab 04/30/20 0009 04/30/20 0902 05/01/20 0635 05/02/20 0424  NA 144 143 146* 144  K 4.4 4.5 4.4 3.6  CL 106 110 112* 112*  CO2 22 21* 24 24  GLUCOSE 181* 130* 92 79  BUN 21 26* 26* 21  CREATININE 2.09* 1.92* 1.84* 1.33*  CALCIUM 9.8 8.8* 8.7* 8.4*  MG  --   --   --  1.8    GFR: Estimated Creatinine Clearance: 30.8 mL/min (A) (by C-G formula based on SCr of 1.33 mg/dL (H)).  Liver Function Tests: Recent Labs  Lab 04/30/20 0009  AST 27  ALT 15  ALKPHOS 107  BILITOT 0.8  PROT 8.8*  ALBUMIN 4.3    CBG: No results for input(s): GLUCAP in the last 168 hours.   Recent Results (from the past 240 hour(s))  SARS Coronavirus 2 by RT PCR (hospital order, performed in [facility] hospital lab) Nasopharyngeal Nasopharyngeal Swab     Status: None   Collection Time: 04/29/20 11:50 PM    Specimen: Nasopharyngeal Swab  Result Value Ref Range Status   SARS Coronavirus 2 NEGATIVE NEGATIVE Final    Comment: (NOTE) SARS-CoV-2 target nucleic acids are NOT DETECTED.  The SARS-CoV-2 RNA is generally detectable in upper and lower respiratory specimens during the acute phase of infection. The lowest concentration of SARS-CoV-2 viral copies this assay can detect is 250 copies / mL. A negative result does not preclude SARS-CoV-2 infection and should not be used as the sole basis for treatment or other patient management decisions.  A negative result may occur with improper specimen collection / handling, submission of specimen other than nasopharyngeal swab, presence of viral mutation(s) within the areas targeted by this assay, and inadequate number of viral copies (<250 copies / mL). A negative result must be combined with clinical observations, patient history, and epidemiological information.  Fact Sheet for Patients:   StrictlyIdeas.no  Fact Sheet for Healthcare Providers: BankingDealers.co.za  This test is not yet approved or  cleared by the [place] FDA and has been authorized for detection and/or diagnosis of SARS-CoV-2 by FDA under an Emergency Use Authorization (EUA).  This EUA will remain in effect (meaning this test can be used) for the duration of the COVID-19 declaration under Section 564(b)(1) of the Act, 21 U.S.C. section 360bbb-3(b)(1), unless the authorization is terminated or revoked sooner.  Performed at [facility], [address] [place], [place] [address]   Blood Culture (routine x 2)     Status: None (Preliminary result)   Collection Time: 04/30/20 12:09 AM   Specimen: BLOOD RIGHT FOREARM  Result Value Ref Range Status   Specimen Description BLOOD RIGHT FOREARM  Final   Special Requests NONE  Final   Culture   Final    NO GROWTH 2 DAYS Performed at [facility], [address], [place],  [place] [address]    Report Status PENDING  Incomplete  Blood Culture (routine x 2)     Status: None (Preliminary result)   Collection Time: 04/30/20  1:20 AM   Specimen: BLOOD LEFT ARM  Result Value Ref Range Status   Specimen Description BLOOD LEFT ARM  Final   Special Requests NONE  Final   Culture   Final    NO GROWTH 2 DAYS Performed at [facility], [address], [place], [place] [address]    Report Status PENDING  Incomplete  Urine culture     Status: Abnormal   Collection Time: 04/30/20  1:45 AM   Specimen: In/Out Cath Urine  Result Value Ref Range Status   Specimen Description   Final    IN/OUT CATH URINE Performed at [facility], [address], [place], [place] [address]    Special Requests   Final    NONE Performed at [facility], [address] [place], [place] [address]    Culture >=100,000 COLONIES/mL ENTEROCOCCUS FAECALIS (A)  Final   Report Status 05/02/2020 FINAL  Final   Organism ID, Bacteria ENTEROCOCCUS FAECALIS (A)  Final      Susceptibility   Enterococcus faecalis - MIC*    AMPICILLIN <=2 SENSITIVE Sensitive     NITROFURANTOIN 32 SENSITIVE Sensitive     VANCOMYCIN 2 SENSITIVE Sensitive     * >=100,000 COLONIES/mL ENTEROCOCCUS FAECALIS     Radiology Studies: No results found.  Scheduled Meds: . apixaban  2.5 mg Oral BID  . Chlorhexidine Gluconate Cloth  6 each Topical Daily  . levothyroxine  50 mcg Oral Q0600  . memantine  10 mg Oral BID  . senna  1 tablet Oral QHS  . sodium chloride flush  10-40 mL Intracatheter Q12H  . vancomycin variable dose per unstable renal function (pharmacist dosing)   Does not apply See admin instructions   Continuous Infusions: . sodium chloride 50 mL/hr at 05/01/20 0300  . vancomycin 750 mg (05/02/20 1507)     LOS: 2 days    Time spent: 35 minutes.   Barton Dubois, MD [facility]   To contact the attending provider between 7A-7P or the covering provider during after hours 7P-7A, please log into the web  site [email or web address] and access using universal [place] password for that web site. If you do not have the password, please call the hospital operator.  05/02/2020, 4:17 PM

## 2020-05-02 NOTE — Progress Notes (Signed)
Occupational Therapy Treatment Patient Details Name: Robert Hurst MRN: [id number] DOB: [birth year] Today's Date: 05/02/2020    History of present illness 86 y.o. male  with past medical history of Alzheimer's dementia, stroke, COVID-19 infection, CKD stage 3, history of DVT on Eliquis, hypothyroidism, dysphagia admitted from [facility] SNF on 04/29/2020 with lethargic, cold, clammy, hypothermic 96.1 F. Admitted with sepsis d/t UTI.   OT comments  Pt seen with PT today, pt more alert and pleasant during session, often requesting orange juice. Pt with minimal effort in mobility and functional transfer tasks. Pt initiated self-feeding when tray placed at bedside, did request OT to add sugar to oatmeal and stir. Pt appears close to baseline, will continue to follow.     Follow Up Recommendations  SNF    Equipment Recommendations  None recommended by OT       Precautions / Restrictions Precautions Precautions: Fall       Mobility Bed Mobility Overal bed mobility: Needs Assistance Bed Mobility: Supine to Sit;Sit to Supine     Supine to sit: Max assist;+2 for physical assistance Sit to supine: Max assist;+2 for physical assistance   General bed mobility comments: Pt reaching for clinicians however little to no effort on pt's part to participate  Transfers Overall transfer level: Needs assistance Equipment used: Rolling walker (2 wheeled) Transfers: Sit to/from Stand Sit to Stand: Max assist;+2 physical assistance                  ADL either performed or assessed with clinical judgement   ADL Overall ADL's : Needs assistance/impaired Eating/Feeding: Set up;Bed level Eating/Feeding Details (indicate cue type and reason): pt able to self-feeding using a spoon after food was set-up/prepared. Pt eating bowl of oatmeal, provided with drinks with straws. Pt using right hand for self-feeding without difficulty Grooming: Wash/dry hands;Set up;Bed level               Lower Body  Dressing: Total assistance;Sitting/lateral leans Lower Body Dressing Details (indicate cue type and reason): OT donning pt socks with pt seated at EOB               General ADL Comments: Pt close to baseline, SNF staff assist with ADLs.                Cognition Arousal/Alertness: Awake/alert Behavior During Therapy: Flat affect Overall Cognitive Status: No family/caregiver present to determine baseline cognitive functioning                                 General Comments: Pt answering questions appropriately ~50% of trials. Pt requesting orange juice often during session                   Pertinent Vitals/ Pain       Pain Assessment: No/denies pain         Frequency  Min 2X/week        Progress Toward Goals  OT Goals(current goals can now be found in the care plan section)  Progress towards OT goals: Progressing toward goals  Acute Rehab OT Goals Patient Stated Goal: None stated OT Goal Formulation: Patient unable to participate in goal setting Time For Goal Achievement: 05/15/20 Potential to Achieve Goals: Fair ADL Goals Pt Will Perform Grooming: with max assist;sitting Pt Will Perform Upper Body Bathing: with max assist;sitting Pt Will Perform Upper Body Dressing: with max assist;sitting Pt Will Transfer to Toilet: with  max assist;bedside commode;stand pivot transfer Pt Will Perform Toileting - Clothing Manipulation and hygiene: with max assist;sitting/lateral leans;sit to/from stand Additional ADL Goal #1: Pt will increase sitting activity tolerance to 5-10 minutes while completing a simple ADL task at bedside.  Plan Discharge plan remains appropriate    Co-evaluation    PT/OT/SLP Co-Evaluation/Treatment: Yes Reason for Co-Treatment: Complexity of the patient's impairments (multi-system involvement);To address functional/ADL transfers   OT goals addressed during session: ADL's and self-care         End of Session Equipment  Utilized During Treatment: Rolling walker  OT Visit Diagnosis: Repeated falls (R29.6);Muscle weakness (generalized) (M62.81)   Activity Tolerance Patient tolerated treatment well   Patient Left in bed;with call bell/phone within reach;with bed alarm set   Nurse Communication          Time: 1610-9604 OT Time Calculation (min): 34 min  Charges: OT General Charges $OT Visit: 1 Visit OT Treatments $Self Care/Home Management : 8-22 mins    Ezra Sites, OTR/L  [phone] 05/02/2020, 10:26 AM

## 2020-05-02 NOTE — Plan of Care (Signed)
  Problem: Acute Rehab PT Goals(only PT should resolve) Goal: Pt Will Go Supine/Side To Sit Outcome: Progressing Flowsheets (Taken 05/02/2020 1242) Pt will go Supine/Side to Sit: with moderate assist Goal: Pt Will Go Sit To Supine/Side Outcome: Progressing Flowsheets (Taken 05/02/2020 1242) Pt will go Sit to Supine/Side: with moderate assist Goal: Patient Will Transfer Sit To/From Stand Outcome: Progressing Flowsheets (Taken 05/02/2020 1242) Patient will transfer sit to/from stand:  with moderate assist  from elevated surface Goal: Pt Will Transfer Bed To Chair/Chair To Bed Outcome: Progressing Flowsheets (Taken 05/02/2020 1242) Pt will Transfer Bed to Chair/Chair to Bed: with mod assist Goal: Pt Will Ambulate Outcome: Progressing Flowsheets (Taken 05/02/2020 1242) Pt will Ambulate:  10 feet  with moderate assist  with rolling walker   Tori Alisson Rozell PT, DPT 05/02/20, 12:43 PM [phone]

## 2020-05-03 DIAGNOSIS — N390 Urinary tract infection, site not specified: Secondary | ICD-10-CM

## 2020-05-03 DIAGNOSIS — R319 Hematuria, unspecified: Secondary | ICD-10-CM

## 2020-05-03 DIAGNOSIS — N289 Disorder of kidney and ureter, unspecified: Secondary | ICD-10-CM

## 2020-05-03 DIAGNOSIS — A4181 Sepsis due to Enterococcus: Secondary | ICD-10-CM

## 2020-05-03 DIAGNOSIS — N189 Chronic kidney disease, unspecified: Secondary | ICD-10-CM

## 2020-05-03 DIAGNOSIS — D696 Thrombocytopenia, unspecified: Secondary | ICD-10-CM

## 2020-05-03 LAB — CBC
HCT: 37.1 % — ABNORMAL LOW (ref 39.0–52.0)
Hemoglobin: 12.4 g/dL — ABNORMAL LOW (ref 13.0–17.0)
MCH: 30.3 pg (ref 26.0–34.0)
MCHC: 33.4 g/dL (ref 30.0–36.0)
MCV: 90.7 fL (ref 80.0–100.0)
Platelets: 112 10*3/uL — ABNORMAL LOW (ref 150–400)
RBC: 4.09 MIL/uL — ABNORMAL LOW (ref 4.22–5.81)
RDW: 14.9 % (ref 11.5–15.5)
WBC: 4.5 10*3/uL (ref 4.0–10.5)
nRBC: 0 % (ref 0.0–0.2)

## 2020-05-03 LAB — BASIC METABOLIC PANEL
Anion gap: 10 (ref 5–15)
BUN: 17 mg/dL (ref 8–23)
CO2: 22 mmol/L (ref 22–32)
Calcium: 8.4 mg/dL — ABNORMAL LOW (ref 8.9–10.3)
Chloride: 110 mmol/L (ref 98–111)
Creatinine, Ser: 1.28 mg/dL — ABNORMAL HIGH (ref 0.61–1.24)
GFR calc Af Amer: 58 mL/min — ABNORMAL LOW (ref >60)
GFR calc non Af Amer: 50 mL/min — ABNORMAL LOW (ref >60)
Glucose, Bld: 90 mg/dL (ref 70–99)
Potassium: 3.3 mmol/L — ABNORMAL LOW (ref 3.5–5.1)
Sodium: 142 mmol/L (ref 135–145)

## 2020-05-03 LAB — CREATININE, SERUM
Creatinine, Ser: 1.2 mg/dL (ref 0.61–1.24)
GFR calc Af Amer: >60 mL/min (ref >60)
GFR calc non Af Amer: 54 mL/min — ABNORMAL LOW (ref >60)

## 2020-05-03 LAB — MAGNESIUM: Magnesium: 1.6 mg/dL — ABNORMAL LOW (ref 1.7–2.4)

## 2020-05-03 MED ORDER — POTASSIUM CHLORIDE CRYS ER 20 MEQ PO TBCR
20.0000 meq | EXTENDED_RELEASE_TABLET | Freq: Once | ORAL | Status: AC
  Administered 2020-05-03: 20 meq via ORAL
  Filled 2020-05-03: qty 1

## 2020-05-03 MED ORDER — VANCOMYCIN HCL 750 MG/150ML IV SOLN
750.0000 mg | INTRAVENOUS | Status: DC
  Administered 2020-05-03 – 2020-05-04 (×2): 750 mg via INTRAVENOUS
  Filled 2020-05-03 (×2): qty 150

## 2020-05-03 NOTE — Progress Notes (Signed)
Pharmacy Antibiotic Note  Robert Hurst is a 86 y.o. male admitted on 04/29/2020 with sepsis and UTI.  Pharmacy has been consulted for Vancomycin dosing.  Plan: Vancomycin 750 mg IV every 24 hours. Expected AUC 540 Monitor labs, c/s, and vanco level as indicated.  Height: 5\' 4"  (162.6 cm) Weight: 54.7 kg (120 lb 9.5 oz) IBW/kg (Calculated) : 59.2  Temp (24hrs), Avg:98.9 F (37.2 C), Min:98.9 F (37.2 C), Max:98.9 F (37.2 C)  Recent Labs  Lab 04/30/20 0009 04/30/20 0902 05/01/20 0635 05/02/20 0424 05/03/20 0500  WBC 8.4 8.7  --  5.5 4.5  CREATININE 2.09* 1.92* 1.84* 1.33* 1.28*  1.20  LATICACIDVEN 6.4*  --   --   --   --     Estimated Creatinine Clearance: 34.2 mL/min (by C-G formula based on SCr of 1.2 mg/dL).    Allergies  Allergen Reactions  . Penicillins     Has patient had a PCN reaction causing immediate rash, facial/tongue/throat swelling, SOB or lightheadedness with hypotension: Unknown Has patient had a PCN reaction causing severe rash involving mucus membranes or skin necrosis: Unknown Has patient had a PCN reaction that required hospitalization: Unknown Has patient had a PCN reaction occurring within the last 10 years: Unknown If all of the above answers are "NO", then may proceed with Cephalosporin use.     Antimicrobials this admission: Vanco 8/8 >>      Microbiology results: 8/8 BCx: ngtd 8/8 UCx: enterococcus faecalis    Thank you for allowing pharmacy to be a part of this patient's care.  10/8 05/03/2020 10:45 AM

## 2020-05-03 NOTE — Progress Notes (Signed)
PROGRESS NOTE  Robert Hurst DGU:[id number] DOB: 02-19-1932 DOA: 04/29/2020 PCP: [name], MD  Brief History:  JoePerkinsis a88 y.o.male,with past medical history ofAlzheimer's dementia, history of stroke COVID-19, history of DVT on Eliquis, hypothyroidism, patient is demented, nonverbal, unable to provide any history, history was obtained from ED staff and medical records, patient was sent by facility given he was lethargic, cold and clammy and nonverbal, sedated to check his rectal temperature this evening which was 96.1, glucose was done at the facility which was 178, patient with hospitalization in the past with similar presentation secondary to UTI, he had similar admission 01/2020 with acute encephalopathy secondary to UTI, where his urine culture growing Enterococcus, pansensitive. - in EDpatient was hypotensive 89/53, but this did respond to fluid bolus, he was hypothermic 35.7 requiring Bair hugger, he was lethargic, his work-up was significant for elevated lactic acid at 6.4, elevated creatinine at 2.09, patient was started on IV vancomycin given his UA was positive, chest x-ray with no acute process, [facility] hospitalist consulted to admit.   Assessment/Plan: severe sepsis:  -present on admission -due to Enterococcus faecalis UTI. -Continue IV vancomycin for now due to PCN allergy -Continue supportive care. -Patient will go back to skilled nursing facility for further care and rehabilitation at discharge. -Continue IV fluids. -sepsis physiology resolved  acute on chronic renal failure--CKD 3a -Due to hypotension and dehydration -baseline creatinine 1.1-1.3 -serum creatinine peaked 2.09 -continue IV fluid -Follow-up renal function trend. -Creatinine continues to downtrend appropriately; reaching almost baseline levels.  hypothyroidism -TSH 13,313 -increase synthroid to 50 mcg daily -TSH in 4 weeks  acute metabolic encephalopathy -Secondary to  sepsis -continue synthroid -05/03/20--daughter at bedside states mental status is near baseline  Dementia without behavioral disturbance -Continue namenda -Constant reorientation and supportive care. -Appreciate evaluation by speech therapy who has recommended dysphagia 2 diet with nectar thick liquids.  hx of DVT -continue Eliquis.  Thrombocytopenia -chronic dating back to nov 2020  Hypokalemia -replete -check mag    Status is: Inpatient  Remains inpatient appropriate because:IV treatments appropriate due to intensity of illness or inability to take PO   Dispo: The patient is from: SNF              Anticipated d/c is to: SNF              Anticipated d/c date is: 1 day              Patient currently is not medically stable to d/c.        Family Communication:   Daughter updated at bedside 8/11  Consultants:  none  Code Status:   DNR  DVT Prophylaxis:  apixaban   Procedures: As Listed in Progress Note Above  Antibiotics: vanco 8/8>>>    Subjective: Patient is pleasantly confused.  Denies f/c,cp,sob,n/vd,abd pain.  Objective: Vitals:   05/02/20 2023 05/02/20 2137 05/03/20 0458 05/03/20 1412  BP:  137/69 120/69 133/81  Pulse:  (!) 54 (!) 57 (!) 54  Resp:  20 16 17   Temp:  98.9 F (37.2 C) 98.9 F (37.2 C) 98.2 F (36.8 C)  TempSrc:   Oral Oral  SpO2: 95% 100% 95% 100%  Weight:      Height:        Intake/Output Summary (Last 24 hours) at 05/03/2020 1644 Last data filed at 05/03/2020 0500 Gross per 24 hour  Intake 2830.73 ml  Output 1300 ml  Net 1530.73 ml  Weight change:  Exam:   General:  Pt is alert, follows commands appropriately, not in acute distress  HEENT: No icterus, No thrush, No neck mass, [place]/AT  Cardiovascular: RRR, S1/S2, no rubs, no gallops  Respiratory: bibasilar crackles. No wheeze  Abdomen: Soft/+BS, non tender, non distended, no guarding  Extremities: No edema, No lymphangitis, No petechiae, No rashes, no  synovitis   Data Reviewed: I have personally reviewed following labs and imaging studies Basic Metabolic Panel: Recent Labs  Lab 04/30/20 0009 04/30/20 0902 05/01/20 0635 05/02/20 0424 05/03/20 0500  NA 144 143 146* 144 142  K 4.4 4.5 4.4 3.6 3.3*  CL 106 110 112* 112* 110  CO2 22 21* 24 24 22   GLUCOSE 181* 130* 92 79 90  BUN 21 26* 26* 21 17  CREATININE 2.09* 1.92* 1.84* 1.33* 1.28*  1.20  CALCIUM 9.8 8.8* 8.7* 8.4* 8.4*  MG  --   --   --  1.8 1.6*   Liver Function Tests: Recent Labs  Lab 04/30/20 0009  AST 27  ALT 15  ALKPHOS 107  BILITOT 0.8  PROT 8.8*  ALBUMIN 4.3   No results for input(s): LIPASE, AMYLASE in the last 168 hours. No results for input(s): AMMONIA in the last 168 hours. Coagulation Profile: Recent Labs  Lab 04/30/20 0009  INR 1.1   CBC: Recent Labs  Lab 04/30/20 0009 04/30/20 0902 05/02/20 0424 05/03/20 0500  WBC 8.4 8.7 5.5 4.5  HGB 17.3* 14.8 13.0 12.4*  HCT 53.2* 44.0 39.1 37.1*  MCV 91.1 88.7 92.0 90.7  PLT 192 160 116* 112*   Cardiac Enzymes: No results for input(s): CKTOTAL, CKMB, CKMBINDEX, TROPONINI in the last 168 hours. BNP: Invalid input(s): POCBNP CBG: No results for input(s): GLUCAP in the last 168 hours. HbA1C: No results for input(s): HGBA1C in the last 72 hours. Urine analysis:    Component Value Date/Time   COLORURINE YELLOW 04/30/2020 0145   APPEARANCEUR CLOUDY (A) 04/30/2020 0145   LABSPEC 1.010 04/30/2020 0145   PHURINE 5.0 04/30/2020 0145   GLUCOSEU NEGATIVE 04/30/2020 0145   HGBUR LARGE (A) 04/30/2020 0145   BILIRUBINUR NEGATIVE 04/30/2020 0145   KETONESUR NEGATIVE 04/30/2020 0145   PROTEINUR 100 (A) 04/30/2020 0145   NITRITE NEGATIVE 04/30/2020 0145   LEUKOCYTESUR LARGE (A) 04/30/2020 0145   Sepsis Labs: @LABRCNTIP (procalcitonin:4,lacticidven:4) ) Recent Results (from the past 240 hour(s))  SARS Coronavirus 2 by RT PCR (hospital order, performed in [facility] hospital lab) Nasopharyngeal  Nasopharyngeal Swab     Status: None   Collection Time: 04/29/20 11:50 PM   Specimen: Nasopharyngeal Swab  Result Value Ref Range Status   SARS Coronavirus 2 NEGATIVE NEGATIVE Final    Comment: (NOTE) SARS-CoV-2 target nucleic acids are NOT DETECTED.  The SARS-CoV-2 RNA is generally detectable in upper and lower respiratory specimens during the acute phase of infection. The lowest concentration of SARS-CoV-2 viral copies this assay can detect is 250 copies / mL. A negative result does not preclude SARS-CoV-2 infection and should not be used as the sole basis for treatment or other patient management decisions.  A negative result may occur with improper specimen collection / handling, submission of specimen other than nasopharyngeal swab, presence of viral mutation(s) within the areas targeted by this assay, and inadequate number of viral copies (<250 copies / mL). A negative result must be combined with clinical observations, patient history, and epidemiological information.  Fact Sheet for Patients:   [facility]  Fact Sheet for Healthcare Providers: 06/29/20  This  test is not yet approved or  cleared by the [place] FDA and has been authorized for detection and/or diagnosis of SARS-CoV-2 by FDA under an Emergency Use Authorization (EUA).  This EUA will remain in effect (meaning this test can be used) for the duration of the COVID-19 declaration under Section 564(b)(1) of the Act, 21 U.S.C. section 360bbb-3(b)(1), unless the authorization is terminated or revoked sooner.  Performed at [facility], [address] [place], [place] [address]   Blood Culture (routine x 2)     Status: None (Preliminary result)   Collection Time: 04/30/20 12:09 AM   Specimen: BLOOD RIGHT FOREARM  Result Value Ref Range Status   Specimen Description BLOOD RIGHT FOREARM  Final   Special Requests NONE  Final   Culture   Final     NO GROWTH 3 DAYS Performed at [facility], [address] [place], [place] [address]    Report Status PENDING  Incomplete  Blood Culture (routine x 2)     Status: None (Preliminary result)   Collection Time: 04/30/20  1:20 AM   Specimen: BLOOD LEFT ARM  Result Value Ref Range Status   Specimen Description BLOOD LEFT ARM  Final   Special Requests NONE  Final   Culture   Final    NO GROWTH 3 DAYS Performed at [facility], [address] [place], [place] [address]    Report Status PENDING  Incomplete  Urine culture     Status: Abnormal   Collection Time: 04/30/20  1:45 AM   Specimen: In/Out Cath Urine  Result Value Ref Range Status   Specimen Description   Final    IN/OUT CATH URINE Performed at [facility], [address] [place], [place] [address]    Special Requests   Final    NONE Performed at [facility], [address] [place], [place] [address]    Culture >=100,000 COLONIES/mL ENTEROCOCCUS FAECALIS (A)  Final   Report Status 05/02/2020 FINAL  Final   Organism ID, Bacteria ENTEROCOCCUS FAECALIS (A)  Final      Susceptibility   Enterococcus faecalis - MIC*    AMPICILLIN <=2 SENSITIVE Sensitive     NITROFURANTOIN 32 SENSITIVE Sensitive     VANCOMYCIN 2 SENSITIVE Sensitive     * >=100,000 COLONIES/mL ENTEROCOCCUS FAECALIS     Scheduled Meds: . apixaban  2.5 mg Oral BID  . Chlorhexidine Gluconate Cloth  6 each Topical Daily  . levothyroxine  50 mcg Oral Q0600  . memantine  10 mg Oral BID  . senna  1 tablet Oral QHS  . sodium chloride flush  10-40 mL Intracatheter Q12H   Continuous Infusions: . sodium chloride 50 mL/hr at 05/02/20 1830  . vancomycin 750 mg (05/03/20 1541)    Procedures/Studies: DG Chest Port 1 View  Result Date: 04/30/2020 CLINICAL DATA:  Sepsis EXAM: PORTABLE CHEST 1 VIEW COMPARISON:  Feb 12, 2020 FINDINGS: The heart size and mediastinal contours are within normal limits. Aortic knob calcifications are seen. Both lungs are clear. The visualized  skeletal structures are unremarkable. IMPRESSION: No active disease. Electronically Signed   By: Jonna ClarkBindu  Avutu M.D.   On: 04/30/2020 00:05   US EKG SITE RITE  Result Date: 04/30/2020 If Site Rite image not attached, placement could not be confirmed due to current cardiac rhythm.  US EKG SITE RITE  Result Date: 04/30/2020 If Site Rite image not attached, placement could not be confirmed due to current cardiac rhythm.   Catarina Hartshornavid Cataleia Gade, DO  [facility]  If 7PM-7AM, please contact night-coverage [email or web address] Password [email or web address] 05/03/2020, 4:44 PM   LOS: 3 days

## 2020-05-03 NOTE — Progress Notes (Signed)
Palliative:  HPI: 86 y.o. male  with past medical history of Alzheimer's dementia, stroke, COVID-19 infection, CKD stage 3, history of DVT on Eliquis, hypothyroidism, dysphagia admitted from [facility] SNF on 7/0/1100 with lethargic, cold, clammy, hypothermic 96.1 F. Admitted with sepsis d/t UTI. I have had palliative discussions with daughter, Robert Hurst previously 08/16/19 and 08/17/19 and plan was for palliative to follow and consideration of addition of hospice in the future.   Robert Hurst is alert and feeding himself breakfast. Pleasant and verbal and asks me to help him open his orange juice. No distress or complaints. He reports that he likes his breakfast. I followed up later today and met with daughter, Robert Hurst, at bedside. We reviewed MOST form and she would like to discuss further with her 5 siblings before officially completing (considering DNR, comfort measures, antibiotics as indicated, trial of IVF, and no feeding tube). MOST not yet complete. We plan to meet tomorrow to complete MOST after she has time to discuss with siblings.   All questions/concerns addressed. Emotional support provided.   Exam: Alert, oriented x 1. HR 50-60s. Breathing regular, unlabored. Abd soft, flat. Extremities warm to touch.   Plan: - Return to SNF when stable.  - Plan to complete MOST tomorrow.  - Consider addition of hospice care at SNF.   25 min   Vinie Sill, NP Palliative Medicine Team Pager [phone] (Please see [email or web address] for schedule) Team Phone [phone]    Greater than 50%  of this time was spent counseling and coordinating care related to the above assessment and plan

## 2020-05-04 DIAGNOSIS — E861 Hypovolemia: Secondary | ICD-10-CM

## 2020-05-04 DIAGNOSIS — I9589 Other hypotension: Secondary | ICD-10-CM

## 2020-05-04 LAB — MAGNESIUM: Magnesium: 1.5 mg/dL — ABNORMAL LOW (ref 1.7–2.4)

## 2020-05-04 LAB — BASIC METABOLIC PANEL
Anion gap: 8 (ref 5–15)
BUN: 13 mg/dL (ref 8–23)
CO2: 24 mmol/L (ref 22–32)
Calcium: 8.4 mg/dL — ABNORMAL LOW (ref 8.9–10.3)
Chloride: 110 mmol/L (ref 98–111)
Creatinine, Ser: 1.19 mg/dL (ref 0.61–1.24)
GFR calc Af Amer: >60 mL/min (ref >60)
GFR calc non Af Amer: 55 mL/min — ABNORMAL LOW (ref >60)
Glucose, Bld: 101 mg/dL — ABNORMAL HIGH (ref 70–99)
Potassium: 3.3 mmol/L — ABNORMAL LOW (ref 3.5–5.1)
Sodium: 142 mmol/L (ref 135–145)

## 2020-05-04 LAB — VITAMIN B12: Vitamin B-12: 190 pg/mL (ref 180–914)

## 2020-05-04 LAB — FOLATE: Folate: 17.2 ng/mL (ref >5.9)

## 2020-05-04 LAB — SARS CORONAVIRUS 2 BY RT PCR (HOSPITAL ORDER, PERFORMED IN ~~LOC~~ HOSPITAL LAB): SARS Coronavirus 2: NEGATIVE

## 2020-05-04 MED ORDER — POTASSIUM CHLORIDE CRYS ER 20 MEQ PO TBCR
20.0000 meq | EXTENDED_RELEASE_TABLET | Freq: Once | ORAL | Status: AC
  Administered 2020-05-04: 20 meq via ORAL
  Filled 2020-05-04: qty 1

## 2020-05-04 MED ORDER — FOSFOMYCIN TROMETHAMINE 3 G PO PACK
3.0000 g | PACK | Freq: Once | ORAL | Status: AC
  Administered 2020-05-04: 3 g via ORAL
  Filled 2020-05-04: qty 3

## 2020-05-04 MED ORDER — MAGNESIUM SULFATE 2 GM/50ML IV SOLN
2.0000 g | Freq: Once | INTRAVENOUS | Status: AC
  Administered 2020-05-04: 2 g via INTRAVENOUS
  Filled 2020-05-04: qty 50

## 2020-05-04 MED ORDER — LEVOTHYROXINE SODIUM 50 MCG PO TABS: 50.0000 ug | ORAL_TABLET | Freq: Every day | ORAL | Status: DC

## 2020-05-04 NOTE — Care Management Important Message (Signed)
Important Message  Patient Details  Name: Robert Hurst MRN: [id number] Date of Birth: 24-May-1932   Medicare Important Message Given:  Yes     Annice Needy, LCSW 05/04/2020, 4:39 PM

## 2020-05-04 NOTE — TOC Transition Note (Signed)
Transition of Care [facility]) - CM/SW Discharge Note   Patient Details  Name: Robert Hurst MRN: [id number] Date of Birth: Dec 09, 1931  Transition of Care [facility]) CM/SW Contact:  Annice Needy, LCSW Phone Number: 05/04/2020, 4:00 PM   Clinical Narrative:    Discharge Clinicals sent to Debbie at [facility].    Final next level of care: Skilled Nursing Facility Barriers to Discharge: No Barriers Identified   Patient Goals and CMS Choice        Discharge Placement                  Name of family member notified: attempt to call wife. Phone range multiple times, no anwer.    Discharge Plan and Services                                     Social Determinants of Health (SDOH) Interventions     Readmission Risk Interventions No flowsheet data found.

## 2020-05-04 NOTE — Progress Notes (Signed)
Palliative:  HPI: 86 y.o.malewith past medical history of Alzheimer's dementia, stroke, COVID-19 infection, CKD stage 3, history of DVT on Eliquis, hypothyroidism, [facility] leth[facility]ic, cold, clammy, hypothermic 96.1 F.Admitted with sepsis d/t UTI. I have had palliative discussions with daughter, Robert Hurst previously 08/16/19 and 11/24/20and plan was for palliative to follow and consideration of addition of hospice in the future.  Mr. Schoch is sleeping and I did not awaken him. He appears comfortable and without distress. I called and spoke with daughter, Robert Hurst, and they are still working on MOST form. We did discuss that Mr. Erbes may return to facility today or tomorrow. She was unable to set a time to meet with me but plans to drop off completed MOST form when they have made a decision and I will sign this when notified to do so by family.   Mr. Knickerbocker could benefit from hospice to follow at facility IF family does not desire rehospitalization and comfort focused care. Until they have determined their wishes I would recommend outpatient palliative care to help them determine further.   Exam: Sleeping. HR 50-60s. Breathing regular, unlabored. Abd flat. Extremities warm.   Plan: - Awaiting family to determine wishes and complete MOST form.  - I think hospice would be appropriate if they do not want further hospitalization. Otherwise recommend outpatient palliative to follow.   15 min  Yong Channel, NP Palliative Medicine Team Pager [phone] (Please see [email or web address] for schedule) Team Phone [phone]    Greater than 50%  of this time was spent counseling and coordinating care related to the above assessment and plan

## 2020-05-04 NOTE — Progress Notes (Addendum)
Physical Therapy Treatment Patient Details Name: Robert Hurst MRN: [id number] DOB: 05/11/1932 Today's Date: 05/04/2020    History of Present Illness 86 y.o. male  with past medical history of Alzheimer's dementia, stroke, COVID-19 infection, CKD stage 3, history of DVT on Eliquis, hypothyroidism, dysphagia admitted from [facility] SNF on 04/29/2020 with lethargic, cold, clammy, hypothermic 96.1 F. Admitted with sepsis d/t UTI.    PT Comments    The patient was able to do supine to sit transfer with mod assist today. He was able to sit EOB independently with BUE support. He was perseverating on tea, but was somewhat satisfied with drinking water from the bedside table. The sit to stand transfer was about to be initiated when the patient went from sitting position to sidelying, became lethargic, and then had an episode of bowel incontinence. Unfortunately the session had to be terminated early due to the patient's bowel incontinence and excessive lethargy. The patient was left in the bed with nursing present and the call bell within arm's reach. PLAN: The patient will continue to benefit from skilled physical therapy services in hospital at recommended venue below in order to improve balance, gait, and ADL's to promote independence in functional activities.    Follow Up Recommendations  SNF     Equipment Recommendations  None recommended by PT    Recommendations for Other Services   None recommended by PT.      Precautions / Restrictions Precautions Precautions: Fall Restrictions Weight Bearing Restrictions: No    Mobility  Bed Mobility Overal bed mobility: Needs Assistance Bed Mobility: Supine to Sit;Sit to Supine;Rolling Rolling: Min assist   Supine to sit: Mod assist;Max assist;HOB elevated Sit to supine: Modified independent (Device/Increase time);Supervision   General bed mobility comments: Pt reaching for clinicians however little to no effort on pt's part to participate,  difficulty w VC/TC's to assist with mobility; was able sit EOB independently with good balance  Transfers              Ambulation/Gait             General Gait Details: unable   Stairs             Wheelchair Mobility    Modified Rankin (Stroke Patients Only)       Balance Overall balance assessment: Modified Independent;Needs assistance Sitting-balance support: Feet supported;Bilateral upper extremity supported Sitting balance-Leahy Scale: Good Sitting balance - Comments: seated EOB; was in the process of facilitating sit to stand transfer when session was terminated early due to patient lethargy and incontinent of stool                                    Cognition Arousal/Alertness: Lethargic (woke up with vigorous shoulder stimulation) Behavior During Therapy: Impulsive Overall Cognitive Status: Difficult to assess                                 General Comments: pt was successful with following directions for first half of session; repeatedly requested tea, and was given water on hand, pt continued to fixate on tea with sugar and ice      Exercises      General Comments        Pertinent Vitals/Pain Pain Assessment: No/denies pain Faces Pain Scale: No hurt    Home Living  Prior Function            PT Goals (current goals can now be found in the care plan section) Acute Rehab PT Goals Patient Stated Goal: None stated PT Goal Formulation: With patient Time For Goal Achievement: 05/16/20 Potential to Achieve Goals: Good Progress towards PT goals: Progressing toward goals    Frequency    Min 2X/week      PT Plan Current plan remains appropriate    Co-evaluation     PT goals addressed during session: Mobility/safety with mobility;Balance;Strengthening/ROM        AM-PAC PT "6 Clicks" Mobility   Outcome Measure  Help needed turning from your back to your side while in  a flat bed without using bedrails?: A Little (excessive VC's) Help needed moving from lying on your back to sitting on the side of a flat bed without using bedrails?: A Lot Help needed moving to and from a bed to a chair (including a wheelchair)?: Total Help needed standing up from a chair using your arms (e.g., wheelchair or bedside chair)?: Total Help needed to walk in hospital room?: Total Help needed climbing 3-5 steps with a railing? : Total 6 Click Score: 9    End of Session   Activity Tolerance: Patient tolerated treatment well;Patient limited by lethargy Patient left: in bed;with call bell/phone within reach;with bed alarm set Nurse Communication: Mobility status;Other (comment) PT Visit Diagnosis: Unsteadiness on feet (R26.81);Other abnormalities of gait and mobility (R26.89);Muscle weakness (generalized) (M62.81)     Time: 1224-8250 PT Time Calculation (min) (ACUTE ONLY): 18 min  Charges:  $Therapeutic Activity: 8-22 mins                     4:24 PM , 05/04/20 Lorin Picket, SPT Physical Therapy with [facility] [phone] office  During this treatment session, the therapist was present, participating in and directing the treatment.  4:24 PM, 05/04/20 Ocie Bob, MPT Physical Therapist with [facility] [phone] office [phone] mobile phone

## 2020-05-04 NOTE — Discharge Summary (Signed)
Physician Discharge Summary  [name] QBV:[id number] DOB: 1932/04/19 DOA: 04/29/2020  PCP: [name], MD  Admit date: 04/29/2020 Discharge date: 05/04/2020  Admitted From: SNF Disposition:  SNF  Recommendations for Outpatient Follow-up:  1. Follow up with PCP in 1-2 weeks 2. Please obtain BMP/CBC in one week    Discharge Condition: Stable CODE STATUS: DNR Diet recommendation: Heart Healthy    Brief/Interim Summary: JoePerkinsis a88 y.o.male,with past medical history ofAlzheimer's dementia, history of stroke COVID-19, history of DVT on Eliquis, hypothyroidism, patient is demented, nonverbal, unable to provide any history, history was obtained from ED staff and medical records, patient was sent by facility given he was lethargic, cold and clammy and nonverbal, sedated to check his rectal temperature this evening which was 96.1, glucose was done at the facility which was 178, patient with hospitalization in the past with similar presentation secondary to UTI, he had similar admission 01/2020 with acute encephalopathy secondary to UTI, where his urine culture growing Enterococcus, pansensitive. - in EDpatient was hypotensive 89/53, but this did respond to fluid bolus, he was hypothermic 35.7 requiring Bair hugger, he was lethargic, his work-up was significant for elevated lactic acid at 6.4, elevated creatinine at 2.09, patient was started on IV vancomycin given his UA was positive, chest x-ray with no acute process, [facility] hospitalist consulted to admit.  Discharge Diagnoses:  severe sepsis:  -present on admission -due to Enterococcus faecalis UTI. -Continue IV vancomycinfor now due to PCN allergy -Continue supportive care. -Patient will go back to skilled nursing facility for further care and rehabilitation at discharge. -Continued IV fluids. -sepsis physiology resolved -received 4 days IV vanco -dosed fosfomycin x 1 on 05/04/20  acute on chronic renal failure--CKD  3a -Due to hypotension and dehydration -baseline creatinine 1.1-1.3 -serum creatinine peaked 2.09 -serum creatinine 1.19 on day of dc -continue IV fluid -Creatinine continues to downtrend appropriately; reaching almost baseline levels.  hypothyroidism -TSH 13,313 -increase synthroid to 50 mcg daily -TSH in 4 weeks  acute metabolic encephalopathy -Secondary to sepsis -continue synthroid -05/03/20--daughter at bedside states mental status is near baseline  Dementia without behavioral disturbance -Continue namenda -Constant reorientation and supportive care. -Appreciate evaluation by speech therapy who has recommended dysphagia 2 diet with nectar thick liquids.  hx of DVT -continue Eliquis.  Thrombocytopenia -chronic dating back to nov 2020  Hypokalemia/Hypomagnesemia -repleted    Discharge Instructions   Allergies as of 05/04/2020      Reactions   Penicillins    Has patient had a PCN reaction causing immediate rash, facial/tongue/throat swelling, SOB or lightheadedness with hypotension: Unknown Has patient had a PCN reaction causing severe rash involving mucus membranes or skin necrosis: Unknown Has patient had a PCN reaction that required hospitalization: Unknown Has patient had a PCN reaction occurring within the last 10 years: Unknown If all of the above answers are "NO", then may proceed with Cephalosporin use.      Medication List    STOP taking these medications   OLANZapine 2.5 MG tablet Commonly known as: ZYPREXA     TAKE these medications   apixaban 2.5 MG Tabs tablet Commonly known as: ELIQUIS Take 1 tablet (2.5 mg total) by mouth 2 (two) times daily.   levothyroxine 50 MCG tablet Commonly known as: SYNTHROID Take 1 tablet (50 mcg total) by mouth daily at 6 (six) AM. Start taking on: May 05, 2020 What changed:   medication strength  how much to take   memantine 10 MG tablet Commonly known as: NAMENDA Take  10 mg by mouth 2 (two)  times daily.   polyethylene glycol 17 g packet Commonly known as: MIRALAX / GLYCOLAX Take 17 g by mouth daily.   senna 8.6 MG tablet Commonly known as: SENOKOT Take 1 tablet by mouth daily.       Contact information for after-discharge care    Destination    [facility] .   Service: Skilled Nursing Contact information: [address] [place] [address] [phone]                 Allergies  Allergen Reactions  . Penicillins     Has patient had a PCN reaction causing immediate rash, facial/tongue/throat swelling, SOB or lightheadedness with hypotension: Unknown Has patient had a PCN reaction causing severe rash involving mucus membranes or skin necrosis: Unknown Has patient had a PCN reaction that required hospitalization: Unknown Has patient had a PCN reaction occurring within the last 10 years: Unknown If all of the above answers are "NO", then may proceed with Cephalosporin use.     Consultations:  palliative   Procedures/Studies: DG Chest Port 1 View  Result Date: 04/30/2020 CLINICAL DATA:  Sepsis EXAM: PORTABLE CHEST 1 VIEW COMPARISON:  Feb 12, 2020 FINDINGS: The heart size and mediastinal contours are within normal limits. Aortic knob calcifications are seen. Both lungs are clear. The visualized skeletal structures are unremarkable. IMPRESSION: No active disease. Electronically Signed   By: [name] M.D.   On: 04/30/2020 00:05   [place] EKG SITE RITE  Result Date: 04/30/2020 If Site Rite image not attached, placement could not be confirmed due to current cardiac rhythm.  [place] EKG SITE RITE  Result Date: 04/30/2020 If Site Rite image not attached, placement could not be confirmed due to current cardiac rhythm.       Discharge Exam: Vitals:   05/04/20 0534 05/04/20 1300  BP: 117/88 119/69  Pulse: 60 (!) 51  Resp: 18 17  Temp: 99.2 F (37.3 C) 98.1 F (36.7 C)  SpO2: 98% 98%   Vitals:   05/03/20  2047 05/03/20 2122 05/04/20 0534 05/04/20 1300  BP: 94/79  117/88 119/69  Pulse: 68  60 (!) 51  Resp: 16  18 17   Temp: (!) 100.5 F (38.1 C) 99.5 F (37.5 C) 99.2 F (37.3 C) 98.1 F (36.7 C)  TempSrc: Oral Oral Oral Oral  SpO2: 97%  98% 98%  Weight:      Height:        General: Pt is alert, awake, not in acute distress Cardiovascular: RRR, S1/S2 +, no rubs, no gallops Respiratory: poor inspiratory effort.  Bibasilar crackles. No wheeze Abdominal: Soft, NT, ND, bowel sounds + Extremities: no edema, no cyanosis   The results of significant diagnostics from this hospitalization (including imaging, microbiology, ancillary and laboratory) are listed below for reference.    Significant Diagnostic Studies: DG Chest Port 1 View  Result Date: 04/30/2020 CLINICAL DATA:  Sepsis EXAM: PORTABLE CHEST 1 VIEW COMPARISON:  Feb 12, 2020 FINDINGS: The heart size and mediastinal contours are within normal limits. Aortic knob calcifications are seen. Both lungs are clear. The visualized skeletal structures are unremarkable. IMPRESSION: No active disease. Electronically Signed   By: Feb 14, 2020 M.D.   On: 04/30/2020 00:05   06/30/2020 EKG SITE RITE  Result Date: 04/30/2020 If Site Rite image not attached, placement could not be confirmed due to current cardiac rhythm.  06/30/2020 EKG SITE RITE  Result Date: 04/30/2020 If Site 06/30/2020  not attached, placement could not be confirmed due to current cardiac rhythm.    Microbiology: Recent Results (from the past 240 hour(s))  SARS Coronavirus 2 by RT PCR (hospital order, [facility]) Nasopharyngeal Nasopharyngeal Swab     Status: None   Collection Time: 04/29/20 11:50 PM   Specimen: Nasopharyngeal Swab  Result Value Ref Range Status   SARS Coronavirus 2 NEGATIVE NEGATIVE Final    Comment: (NOTE) SARS-CoV-2 target nucleic acids are NOT DETECTED.  The SARS-CoV-2 RNA is generally detectable in upper and lower respiratory specimens  during the acute phase of infection. The lowest concentration of SARS-CoV-2 viral copies this assay can detect is 250 copies / mL. A negative result does not preclude SARS-CoV-2 infection and should not be used as the sole basis for treatment or other patient management decisions.  A negative result may occur with improper specimen collection / handling, submission of specimen other than nasopharyngeal swab, presence of viral mutation(s) within the areas targeted by this assay, and inadequate number of viral copies (<250 copies / mL). A negative result must be combined with clinical observations, patient history, and epidemiological information.  Fact Sheet for Patients:   BoilerBrush.com.cyhttps://www.fda.gov/media/136312/download  Fact Sheet for Healthcare Providers: https://pope.com/https://www.fda.gov/media/136313/download  This test is not yet approved or  cleared by the [place] FDA and has been authorized for detection and/or diagnosis of SARS-CoV-2 by FDA under an Emergency Use Authorization (EUA).  This EUA will remain in effect (meaning this test can be used) for the duration of the COVID-19 declaration under Section 564(b)(1) of the Act, 21 U.S.C. section 360bbb-3(b)(1), unless the authorization is terminated or revoked sooner.  Performed at [facility], [address] [place], [place] [address]   Blood Culture (routine x 2)     Status: None (Preliminary result)   Collection Time: 04/30/20 12:09 AM   Specimen: BLOOD RIGHT FOREARM  Result Value Ref Range Status   Specimen Description BLOOD RIGHT FOREARM  Final   Special Requests NONE  Final   Culture   Final    NO GROWTH 3 DAYS Performed at [facility], [address] [place], [place] [address]    Report Status PENDING  Incomplete  Blood Culture (routine x 2)     Status: None (Preliminary result)   Collection Time: 04/30/20  1:20 AM   Specimen: BLOOD LEFT ARM  Result Value Ref Range Status   Specimen Description BLOOD LEFT ARM  Final    Special Requests NONE  Final   Culture   Final    NO GROWTH 3 DAYS Performed at [facility], [address] [place], [place] [address]    Report Status PENDING  Incomplete  Urine culture     Status: Abnormal   Collection Time: 04/30/20  1:45 AM   Specimen: In/Out Cath Urine  Result Value Ref Range Status   Specimen Description   Final    IN/OUT CATH URINE Performed at [facility], [address] [place], [place] [address]    Special Requests   Final    NONE Performed at [facility], [address] [place], [place] [address]    Culture >=100,000 COLONIES/mL ENTEROCOCCUS FAECALIS (A)  Final   Report Status 05/02/2020 FINAL  Final   Organism ID, Bacteria ENTEROCOCCUS FAECALIS (A)  Final      Susceptibility   Enterococcus faecalis - MIC*    AMPICILLIN <=2 SENSITIVE Sensitive     NITROFURANTOIN 32 SENSITIVE Sensitive     VANCOMYCIN 2  SENSITIVE Sensitive     * >=100,000 COLONIES/mL ENTEROCOCCUS FAECALIS     Labs: Basic Metabolic Panel: Recent Labs  Lab 04/30/20 0902 04/30/20 0902 05/01/20 0076 05/01/20 2263 05/02/20 0424 05/02/20 0424 05/03/20 0500 05/04/20 0451  NA 143  --  146*  --  144  --  142 142  K 4.5   < > 4.4   < > 3.6   < > 3.3* 3.3*  CL 110  --  112*  --  112*  --  110 110  CO2 21*  --  24  --  24  --  22 24  GLUCOSE 130*  --  92  --  79  --  90 101*  BUN 26*  --  26*  --  21  --  17 13  CREATININE 1.92*  --  1.84*  --  1.33*  --  1.28*  1.20 1.19  CALCIUM 8.8*  --  8.7*  --  8.4*  --  8.4* 8.4*  MG  --   --   --   --  1.8  --  1.6* 1.5*   < > = values in this interval not displayed.   Liver Function Tests: Recent Labs  Lab 04/30/20 0009  AST 27  ALT 15  ALKPHOS 107  BILITOT 0.8  PROT 8.8*  ALBUMIN 4.3   No results for input(s): LIPASE, AMYLASE in the last 168 hours. No results for input(s): AMMONIA in the last 168 hours. CBC: Recent Labs  Lab 04/30/20 0009 04/30/20 0902 05/02/20 0424 05/03/20 0500  WBC 8.4 8.7 5.5 4.5  HGB 17.3* 14.8  13.0 12.4*  HCT 53.2* 44.0 39.1 37.1*  MCV 91.1 88.7 92.0 90.7  PLT 192 160 116* 112*   Cardiac Enzymes: No results for input(s): CKTOTAL, CKMB, CKMBINDEX, TROPONINI in the last 168 hours. BNP: Invalid input(s): POCBNP CBG: No results for input(s): GLUCAP in the last 168 hours.  Time coordinating discharge:  36 minutes  Signed:  Catarina Hartshorn, DO [facility] Pager: [phone] 05/04/2020, 3:54 PM

## 2020-05-05 LAB — CULTURE, BLOOD (ROUTINE X 2)
Culture: NO GROWTH
Culture: NO GROWTH

## 2020-05-22 ENCOUNTER — Encounter (HOSPITAL_COMMUNITY): Payer: Self-pay

## 2020-05-22 ENCOUNTER — Inpatient Hospital Stay (HOSPITAL_COMMUNITY)
Admission: EM | Admit: 2020-05-22 | Discharge: 2020-05-26 | DRG: 871 | Disposition: A | Discharge status: Skilled Nursing Facility | Payer: Medicare Other | Source: Skilled Nursing Facility | Attending: Family Medicine | Admitting: Family Medicine

## 2020-05-22 ENCOUNTER — Other Ambulatory Visit: Payer: Self-pay

## 2020-05-22 ENCOUNTER — Emergency Department (HOSPITAL_COMMUNITY): Payer: Medicare Other

## 2020-05-22 DIAGNOSIS — Z515 Encounter for palliative care: Secondary | ICD-10-CM | POA: Diagnosis not present

## 2020-05-22 DIAGNOSIS — A4189 Other specified sepsis: Principal | ICD-10-CM | POA: Diagnosis present

## 2020-05-22 DIAGNOSIS — A419 Sepsis, unspecified organism: Secondary | ICD-10-CM | POA: Diagnosis present

## 2020-05-22 DIAGNOSIS — I959 Hypotension, unspecified: Secondary | ICD-10-CM | POA: Diagnosis not present

## 2020-05-22 DIAGNOSIS — Z79890 Hormone replacement therapy (postmenopausal): Secondary | ICD-10-CM

## 2020-05-22 DIAGNOSIS — E875 Hyperkalemia: Secondary | ICD-10-CM | POA: Diagnosis not present

## 2020-05-22 DIAGNOSIS — F0280 Dementia in other diseases classified elsewhere without behavioral disturbance: Secondary | ICD-10-CM | POA: Diagnosis present

## 2020-05-22 DIAGNOSIS — R400 Somnolence: Secondary | ICD-10-CM

## 2020-05-22 DIAGNOSIS — N390 Urinary tract infection, site not specified: Secondary | ICD-10-CM | POA: Diagnosis present

## 2020-05-22 DIAGNOSIS — U071 COVID-19: Secondary | ICD-10-CM

## 2020-05-22 DIAGNOSIS — Z66 Do not resuscitate: Secondary | ICD-10-CM | POA: Diagnosis present

## 2020-05-22 DIAGNOSIS — E872 Acidosis, unspecified: Secondary | ICD-10-CM | POA: Diagnosis present

## 2020-05-22 DIAGNOSIS — R680 Hypothermia, not associated with low environmental temperature: Secondary | ICD-10-CM | POA: Diagnosis present

## 2020-05-22 DIAGNOSIS — G9341 Metabolic encephalopathy: Secondary | ICD-10-CM | POA: Diagnosis present

## 2020-05-22 DIAGNOSIS — D696 Thrombocytopenia, unspecified: Secondary | ICD-10-CM | POA: Diagnosis present

## 2020-05-22 DIAGNOSIS — N179 Acute kidney failure, unspecified: Secondary | ICD-10-CM | POA: Diagnosis present

## 2020-05-22 DIAGNOSIS — E039 Hypothyroidism, unspecified: Secondary | ICD-10-CM | POA: Diagnosis present

## 2020-05-22 DIAGNOSIS — N3000 Acute cystitis without hematuria: Secondary | ICD-10-CM

## 2020-05-22 DIAGNOSIS — Z7189 Other specified counseling: Secondary | ICD-10-CM | POA: Diagnosis not present

## 2020-05-22 DIAGNOSIS — Z86718 Personal history of other venous thrombosis and embolism: Secondary | ICD-10-CM

## 2020-05-22 DIAGNOSIS — R6520 Severe sepsis without septic shock: Secondary | ICD-10-CM | POA: Diagnosis present

## 2020-05-22 DIAGNOSIS — N1832 Chronic kidney disease, stage 3b: Secondary | ICD-10-CM | POA: Diagnosis present

## 2020-05-22 DIAGNOSIS — Z7901 Long term (current) use of anticoagulants: Secondary | ICD-10-CM

## 2020-05-22 DIAGNOSIS — E870 Hyperosmolality and hypernatremia: Secondary | ICD-10-CM | POA: Diagnosis not present

## 2020-05-22 DIAGNOSIS — D6959 Other secondary thrombocytopenia: Secondary | ICD-10-CM | POA: Diagnosis present

## 2020-05-22 DIAGNOSIS — Z8616 Personal history of COVID-19: Secondary | ICD-10-CM

## 2020-05-22 DIAGNOSIS — Z8673 Personal history of transient ischemic attack (TIA), and cerebral infarction without residual deficits: Secondary | ICD-10-CM

## 2020-05-22 DIAGNOSIS — Z9109 Other allergy status, other than to drugs and biological substances: Secondary | ICD-10-CM

## 2020-05-22 DIAGNOSIS — K5909 Other constipation: Secondary | ICD-10-CM | POA: Diagnosis present

## 2020-05-22 DIAGNOSIS — E860 Dehydration: Secondary | ICD-10-CM | POA: Diagnosis present

## 2020-05-22 DIAGNOSIS — Z79899 Other long term (current) drug therapy: Secondary | ICD-10-CM

## 2020-05-22 DIAGNOSIS — G309 Alzheimer's disease, unspecified: Secondary | ICD-10-CM | POA: Diagnosis present

## 2020-05-22 DIAGNOSIS — R41841 Cognitive communication deficit: Secondary | ICD-10-CM | POA: Diagnosis present

## 2020-05-22 DIAGNOSIS — R001 Bradycardia, unspecified: Secondary | ICD-10-CM | POA: Diagnosis present

## 2020-05-22 HISTORY — DX: Metabolic encephalopathy: G93.41

## 2020-05-22 HISTORY — DX: Syncope and collapse: R55

## 2020-05-22 HISTORY — DX: Calculus of bile duct with acute cholecystitis without obstruction: K80.42

## 2020-05-22 LAB — CBC WITH DIFFERENTIAL/PLATELET
Abs Immature Granulocytes: 0.05 10*3/uL (ref 0.00–0.07)
Basophils Absolute: 0 10*3/uL (ref 0.0–0.1)
Basophils Relative: 1 %
Eosinophils Absolute: 0.1 10*3/uL (ref 0.0–0.5)
Eosinophils Relative: 2 %
HCT: 51.1 % (ref 39.0–52.0)
Hemoglobin: 16.2 g/dL (ref 13.0–17.0)
Immature Granulocytes: 1 %
Lymphocytes Relative: 18 %
Lymphs Abs: 1.4 10*3/uL (ref 0.7–4.0)
MCH: 29.5 pg (ref 26.0–34.0)
MCHC: 31.7 g/dL (ref 30.0–36.0)
MCV: 92.9 fL (ref 80.0–100.0)
Monocytes Absolute: 0.9 10*3/uL (ref 0.1–1.0)
Monocytes Relative: 12 %
Neutro Abs: 5 10*3/uL (ref 1.7–7.7)
Neutrophils Relative %: 66 %
Platelets: 169 10*3/uL (ref 150–400)
RBC: 5.5 MIL/uL (ref 4.22–5.81)
RDW: 15.4 % (ref 11.5–15.5)
WBC: 7.4 10*3/uL (ref 4.0–10.5)
nRBC: 0 % (ref 0.0–0.2)

## 2020-05-22 LAB — LACTIC ACID, PLASMA
Lactic Acid, Venous: 5.9 mmol/L (ref 0.5–1.9)
Lactic Acid, Venous: 4.6 mmol/L (ref 0.5–1.9)

## 2020-05-22 LAB — URINALYSIS, ROUTINE W REFLEX MICROSCOPIC
Bacteria, UA: NONE SEEN
Bilirubin Urine: NEGATIVE
Glucose, UA: NEGATIVE mg/dL
Ketones, ur: NEGATIVE mg/dL
Nitrite: NEGATIVE
Protein, ur: 30 mg/dL — AB
Specific Gravity, Urine: 1.01 (ref 1.005–1.030)
pH: 6 (ref 5.0–8.0)

## 2020-05-22 LAB — COMPREHENSIVE METABOLIC PANEL
ALT: 14 U/L (ref 0–44)
AST: 26 U/L (ref 15–41)
Albumin: 3.7 g/dL (ref 3.5–5.0)
Alkaline Phosphatase: 91 U/L (ref 38–126)
Anion gap: 14 (ref 5–15)
BUN: 16 mg/dL (ref 8–23)
CO2: 25 mmol/L (ref 22–32)
Calcium: 9.8 mg/dL (ref 8.9–10.3)
Chloride: 107 mmol/L (ref 98–111)
Creatinine, Ser: 1.9 mg/dL — ABNORMAL HIGH (ref 0.61–1.24)
GFR calc Af Amer: 36 mL/min — ABNORMAL LOW (ref >60)
GFR calc non Af Amer: 31 mL/min — ABNORMAL LOW (ref >60)
Glucose, Bld: 128 mg/dL — ABNORMAL HIGH (ref 70–99)
Potassium: 3.6 mmol/L (ref 3.5–5.1)
Sodium: 146 mmol/L — ABNORMAL HIGH (ref 135–145)
Total Bilirubin: 0.6 mg/dL (ref 0.3–1.2)
Total Protein: 8 g/dL (ref 6.5–8.1)

## 2020-05-22 LAB — PROTIME-INR
INR: 1 (ref 0.8–1.2)
Prothrombin Time: 13 seconds (ref 11.4–15.2)

## 2020-05-22 LAB — HEPARIN LEVEL (UNFRACTIONATED): Heparin Unfractionated: <0.1 IU/mL — ABNORMAL LOW (ref 0.30–0.70)

## 2020-05-22 LAB — APTT: aPTT: 26 seconds (ref 24–36)

## 2020-05-22 LAB — PROCALCITONIN: Procalcitonin: <0.1 ng/mL

## 2020-05-22 LAB — ABO/RH: ABO/RH(D): O POS

## 2020-05-22 LAB — SARS CORONAVIRUS 2 BY RT PCR (HOSPITAL ORDER, PERFORMED IN ~~LOC~~ HOSPITAL LAB): SARS Coronavirus 2: POSITIVE — AB

## 2020-05-22 MED ORDER — SODIUM CHLORIDE 0.9 % IV SOLN
2.0000 g | INTRAVENOUS | Status: DC
  Administered 2020-05-23: 2 g via INTRAVENOUS
  Filled 2020-05-22 (×2): qty 2

## 2020-05-22 MED ORDER — LACTATED RINGERS IV BOLUS (SEPSIS)
1000.0000 mL | Freq: Once | INTRAVENOUS | Status: AC
  Administered 2020-05-22: 1000 mL via INTRAVENOUS

## 2020-05-22 MED ORDER — VANCOMYCIN HCL 750 MG/150ML IV SOLN
750.0000 mg | INTRAVENOUS | Status: DC
  Administered 2020-05-24: 750 mg via INTRAVENOUS
  Filled 2020-05-22: qty 150

## 2020-05-22 MED ORDER — METHYLPREDNISOLONE SODIUM SUCC 40 MG IJ SOLR
0.5000 mg/kg | Freq: Two times a day (BID) | INTRAMUSCULAR | Status: DC
  Administered 2020-05-22 – 2020-05-26 (×8): 27.2 mg via INTRAVENOUS
  Filled 2020-05-22 (×7): qty 1

## 2020-05-22 MED ORDER — SODIUM CHLORIDE 0.9 % IV SOLN
100.0000 mg | Freq: Every day | INTRAVENOUS | Status: AC
  Administered 2020-05-23 – 2020-05-26 (×4): 100 mg via INTRAVENOUS
  Filled 2020-05-22 (×4): qty 20

## 2020-05-22 MED ORDER — LACTATED RINGERS IV SOLN: INTRAVENOUS | Status: AC

## 2020-05-22 MED ORDER — SODIUM CHLORIDE 0.9 % IV SOLN
100.0000 mg | INTRAVENOUS | Status: AC
  Administered 2020-05-22 (×2): 100 mg via INTRAVENOUS
  Filled 2020-05-22 (×2): qty 20

## 2020-05-22 MED ORDER — SODIUM CHLORIDE 0.9 % IV SOLN
2.0000 g | Freq: Once | INTRAVENOUS | Status: AC
  Administered 2020-05-22: 2 g via INTRAVENOUS
  Filled 2020-05-22: qty 2

## 2020-05-22 MED ORDER — MEMANTINE HCL 10 MG PO TABS
10.0000 mg | ORAL_TABLET | Freq: Two times a day (BID) | ORAL | Status: DC
  Administered 2020-05-22 – 2020-05-24 (×2): 10 mg via ORAL
  Filled 2020-05-22 (×4): qty 1

## 2020-05-22 MED ORDER — ONDANSETRON HCL 4 MG PO TABS: 4.0000 mg | ORAL_TABLET | Freq: Four times a day (QID) | ORAL | Status: DC | PRN

## 2020-05-22 MED ORDER — LEVOTHYROXINE SODIUM 100 MCG/5ML IV SOLN: 25.0000 ug | Freq: Every day | INTRAVENOUS | Status: DC

## 2020-05-22 MED ORDER — ACETAMINOPHEN 325 MG PO TABS: 650.0000 mg | ORAL_TABLET | Freq: Four times a day (QID) | ORAL | Status: DC | PRN

## 2020-05-22 MED ORDER — METRONIDAZOLE IN NACL 5-0.79 MG/ML-% IV SOLN
500.0000 mg | Freq: Once | INTRAVENOUS | Status: AC
  Administered 2020-05-22: 500 mg via INTRAVENOUS
  Filled 2020-05-22: qty 100

## 2020-05-22 MED ORDER — SODIUM CHLORIDE 0.9 % IV SOLN: 2.0000 g | Freq: Two times a day (BID) | INTRAVENOUS | Status: DC

## 2020-05-22 MED ORDER — HEPARIN (PORCINE) 25000 UT/250ML-% IV SOLN
800.0000 [IU]/h | INTRAVENOUS | Status: DC
  Administered 2020-05-22: 800 [IU]/h via INTRAVENOUS
  Filled 2020-05-22: qty 250

## 2020-05-22 MED ORDER — SODIUM CHLORIDE 0.9 % IV SOLN: 100.0000 mg | Freq: Every day | INTRAVENOUS | Status: DC

## 2020-05-22 MED ORDER — ONDANSETRON HCL 4 MG/2ML IJ SOLN: 4.0000 mg | Freq: Four times a day (QID) | INTRAMUSCULAR | Status: DC | PRN

## 2020-05-22 MED ORDER — HEPARIN BOLUS VIA INFUSION
2500.0000 [IU] | Freq: Once | INTRAVENOUS | Status: AC
  Administered 2020-05-22: 2500 [IU] via INTRAVENOUS

## 2020-05-22 MED ORDER — POTASSIUM CHLORIDE IN NACL 20-0.45 MEQ/L-% IV SOLN
INTRAVENOUS | Status: DC
  Filled 2020-05-22 (×6): qty 1000

## 2020-05-22 MED ORDER — SODIUM CHLORIDE 0.9 % IV SOLN: 200.0000 mg | Freq: Once | INTRAVENOUS | Status: DC

## 2020-05-22 MED ORDER — VANCOMYCIN HCL 1250 MG/250ML IV SOLN
1250.0000 mg | Freq: Once | INTRAVENOUS | Status: AC
  Administered 2020-05-22: 1250 mg via INTRAVENOUS
  Filled 2020-05-22: qty 250

## 2020-05-22 MED ORDER — DEXAMETHASONE SODIUM PHOSPHATE 10 MG/ML IJ SOLN
10.0000 mg | Freq: Once | INTRAMUSCULAR | Status: AC
  Administered 2020-05-22: 10 mg via INTRAVENOUS
  Filled 2020-05-22: qty 1

## 2020-05-22 MED ORDER — VANCOMYCIN HCL IN DEXTROSE 1-5 GM/200ML-% IV SOLN: 1000.0000 mg | Freq: Once | INTRAVENOUS | Status: DC

## 2020-05-22 NOTE — H&P (Addendum)
History and Physical  Robert Hurst ACZ:[id number] DOB: 1932/01/03 DOA: 05/22/2020   PCP: Robert Deiters, MD   Patient coming from: Home  Chief Complaint: unresponsiveness  HPI:  Robert Hurst is a 86 y.o. male with medical history of Alzheimer's dementia, history of stroke COVID-19, history of DVT on Eliquis, hypothyroidism, patient is demented, nonverbal, unable to provide any history, history was obtained from ED staff and medical records, patient was sent by facility given he was lethargic, cold and clammy and nonverbal.  At baseline, the patient is able to say a few words.  Nevertheless, the patient had recent admission to the hospital from 04/29/2020 to 05/04/2020 secondary to a similar presentation when he was noted to be hypotensive, lethargic, and clammy with a rectal temperature of 96 F.  He was treated for sepsis secondary to Enterococcus faecalis UTI and subsequently sent back to his nursing facility. In the emergency department, the patient was noted to be hypothermic with a temperature of 96 point degrees Fahrenheit.  He was initially hypotensive with a systolic blood pressure in the 70s.  Oxygen saturation varied from 90 to 94% on room air.  The patient was noted to be COVID-19 positive.  He was started on remdesivir and steroids.  In addition, UA showed 11-20 WBC.  Lactic acid was 5.9.  Chest x-ray showed bibasilar opacities.  The patient was started on vancomycin, cefepime, and metronidazole.  BP responded well to 2L LR improving to low 100s.  Assessment/Plan: Severe sepsis -Presented with hypothermia, hypotension, elevated lactic acid -Likely secondary to urinary source and possible pneumonia -Check procalcitonin -Lactic acid peaked at 5.9 -Check inflammatory markers for COVID-19 -Follow cultures -Continue empiric vancomycin and cefepime pending culture data -Continue IV fluids  COVID-19 pneumonia -Personally reviewed chest x-ray--bibasilar opacities -Oxygen saturation  varying from 90-94% -Continue remdesivir and steroids -The patient has listed in his past medical history that he has had COVID-19 in the past, but I Robert Hurst not have any records to validate this from his laboratory evaluations -Check daily inflammatory markers  Acute on chronic renal failure--CKD stage IIIa -Secondary to sepsis and dehydration -Baseline creatinine 1.1-1.3 -Presented with serum creatinine 1.90  Hypothyroidism -Continue levothyroxine IV until the patient is awake enough to tolerate oral medications  acute metabolic encephalopathy -Secondary tosepsis -continue synthroid  Dementiawithout behavioral disturbance -Continuenamenda -Constant reorientation and supportive care. -Appreciate evaluation by speech therapy who has recommended dysphagia 2 diet with nectar thick liquids.  hx of DVT -continue Eliquis when able to tolerate po -IV heparin for now  Thrombocytopenia -chronic dating back to nov 2020  Goals of Care -DNR confirmed by daughter--she would not want vasopressors, ICU, central line          Past Medical History:  Diagnosis Date  . Alzheimer disease (HCC)   . Calculus of bile duct (any) with acute cholecystitis   . Cognitive communication deficit   . COVID-19   . Dementia (HCC)   . DVT (deep venous thrombosis) (HCC)   . Dysphagia   . Malnourished (HCC)   . Metabolic encephalopathy   . Protein calorie malnutrition (HCC)   . Renal disorder   . Syncope and collapse   . Thyroid disease    Past Surgical History:  Procedure Laterality Date  . APPENDECTOMY    . BALLOON DILATION N/A 01/15/2018   Procedure: BALLOON DILATION;  Surgeon: Robert Hippo, MD;  [facility];  Service: Endoscopy;  Laterality: N/A;  . ERCP N/A 01/15/2018  Procedure: ENDOSCOPIC RETROGRADE CHOLANGIOPANCREATOGRAPHY (ERCP);  Surgeon: Robert Hippo, MD;  [facility];  Service: Endoscopy;  Laterality: N/A;  . REMOVAL OF STONES N/A 01/15/2018    Procedure: REMOVAL OF STONES;  Surgeon: Robert Hippo, MD;  [facility];  Service: Endoscopy;  Laterality: N/A;  . SPHINCTEROTOMY N/A 01/15/2018   Procedure: SPHINCTEROTOMY;  Surgeon: Robert Hippo, MD;  [facility];  Service: Endoscopy;  Laterality: N/A;  . TONSILLECTOMY     Social History:  reports that he has never smoked. He has never used smokeless tobacco. He reports previous alcohol use. He reports previous drug use.   FAmily history: unobtainable due to mental status  Allergies  Allergen Reactions  . Penicillins     Has patient had a PCN reaction causing immediate rash, facial/tongue/throat swelling, SOB or lightheadedness with hypotension: Unknown Has patient had a PCN reaction causing severe rash involving mucus membranes or skin necrosis: Unknown Has patient had a PCN reaction that required hospitalization: Unknown Has patient had a PCN reaction occurring within the last 10 years: Unknown If all of the above answers are "NO", then may proceed with Cephalosporin use.      Prior to Admission medications   Medication Sig Start Date End Date Taking? Authorizing Provider  apixaban (ELIQUIS) 2.5 MG TABS tablet Take 1 tablet (2.5 mg total) by mouth 2 (two) times daily. 08/17/19 04/30/21  Robert Hurst, Robert Hurst, Robert Hurst  levothyroxine (SYNTHROID) 50 MCG tablet Take 1 tablet (50 mcg total) by mouth daily at 6 (six) AM. 05/05/20   Robert Hurst, Robert Hua, MD  memantine (NAMENDA) 10 MG tablet Take 10 mg by mouth 2 (two) times daily.    [provider]  polyethylene glycol (MIRALAX / GLYCOLAX) 17 g packet Take 17 g by mouth daily.    [provider]  senna (SENOKOT) 8.6 MG tablet Take 1 tablet by mouth daily.    [provider]    Review of Systems:   Unobtainable due to mental status  Physical Exam: Vitals:   05/22/20 1400 05/22/20 1415 05/22/20 1430 05/22/20 1445  BP: 124/78  127/70   Pulse: (!) 57 (!) 52 (!) 55 (!) 58  Resp: 12 14 12 11   Temp:       TempSrc:      SpO2: 96% 97% 94% 93%  Weight:      Height:       General:  Intermittently awakens.  Does not follow commands.  No distress Head/Eye: No conjunctival hemorrhage, no icterus, Robert Hurst/AT, No nystagmus ENT:  No icterus,  No thrush, good dentition, no pharyngeal exudate Neck:  No masses, no lymphadenpathy, no bruits CV:  RRR, no rub, no gallop, no S3 Lung:  Scattered bilateral rales. No wheeze Abdomen: soft/NT, +BS, nondistended, no peritoneal signs Ext: No cyanosis, No rashes, No petechiae, No lymphangitis, No edema  Labs on Admission:  Basic Metabolic Panel: Recent Labs  Lab 05/22/20 1316  NA 146*  K 3.6  CL 107  CO2 25  GLUCOSE 128*  BUN 16  CREATININE 1.90*  CALCIUM 9.8   Liver Function Tests: Recent Labs  Lab 05/22/20 1316  AST 26  ALT 14  ALKPHOS 91  BILITOT 0.6  PROT 8.0  ALBUMIN 3.7   No results for input(s): LIPASE, AMYLASE in the last 168 hours. No results for input(s): AMMONIA in the last 168 hours. CBC: Recent Labs  Lab 05/22/20 1316  WBC 7.4  NEUTROABS 5.0  HGB 16.2  HCT 51.1  MCV 92.9  PLT 169   Coagulation Profile: Recent Labs  Lab 05/22/20 1316  INR 1.0   Cardiac Enzymes: No results for input(s): CKTOTAL, CKMB, CKMBINDEX, TROPONINI in the last 168 hours. BNP: Invalid input(s): POCBNP CBG: No results for input(s): GLUCAP in the last 168 hours. Urine analysis:    Component Value Date/Time   COLORURINE YELLOW 05/22/2020 1202   APPEARANCEUR CLEAR 05/22/2020 1202   LABSPEC 1.010 05/22/2020 1202   PHURINE 6.0 05/22/2020 1202   GLUCOSEU NEGATIVE 05/22/2020 1202   HGBUR MODERATE (A) 05/22/2020 1202   BILIRUBINUR NEGATIVE 05/22/2020 1202   KETONESUR NEGATIVE 05/22/2020 1202   PROTEINUR 30 (A) 05/22/2020 1202   NITRITE NEGATIVE 05/22/2020 1202   LEUKOCYTESUR SMALL (A) 05/22/2020 1202   Sepsis Labs: @LABRCNTIP (procalcitonin:4,lacticidven:4) ) Recent Results (from the past 240 hour(s))  SARS Coronavirus 2 by RT PCR  (hospital order, performed in [facility] hospital lab) Nasopharyngeal Nasopharyngeal Swab     Status: Abnormal   Collection Time: 05/22/20 12:14 PM   Specimen: Nasopharyngeal Swab  Result Value Ref Range Status   SARS Coronavirus 2 POSITIVE (A) NEGATIVE Final    Comment: CRITICAL RESULT CALLED TO, READ BACK BY AND VERIFIED WITH: CRADWELL,L AT 1416 ON 8.30.21 BY ISLEY,B (NOTE) SARS-CoV-2 target nucleic acids are DETECTED  SARS-CoV-2 RNA is generally detectable in upper respiratory specimens  during the acute phase of infection.  Positive results are indicative  of the presence of the identified virus, but Robert Hurst not rule out bacterial infection or co-infection with other pathogens not detected by the test.  Clinical correlation with patient history and  other diagnostic information is necessary to determine patient infection status.  The expected result is negative.  Fact Sheet for Patients:   02-02-1990   Fact Sheet for Healthcare Providers:   BoilerBrush.com.cy    This test is not yet approved or cleared by the https://pope.com/ FDA and  has been authorized for detection and/or diagnosis of SARS-CoV-2 by FDA under an Emergency Use Authorization (EUA).  This EUA will remain in effect ( meaning this test can be used) for the duration of  the COVID-19 declaration under Section 564(b)(1) of the Act, 21 U.S.C. section 360-bbb-3(b)(1), unless the authorization is terminated or revoked sooner.  Performed at [facility], [address], [place], [place]      Radiological Exams on Admission: DG Chest [facility] 1 View  Result Date: 05/22/2020 CLINICAL DATA:  Shortness of breath. EXAM: PORTABLE CHEST 1 VIEW COMPARISON:  April 29, 2020. FINDINGS: Stable cardiomediastinal silhouette. No pneumothorax is noted. Minimal right basilar subsegmental atelectasis is noted. Mild left basilar atelectasis or infiltrate is noted with probable small  left pleural effusion. Bony thorax is unremarkable. IMPRESSION: Minimal right basilar subsegmental atelectasis. Mild left basilar atelectasis or infiltrate is noted with probable small left pleural effusion. Aortic Atherosclerosis (ICD10-I70.0). Electronically Signed   By: May 01, 2020 M.Hurst.   On: 05/22/2020 12:28    EKG: Independently reviewed. Sinus, nonspecific TWI    Time spent:60 minutes Code Status:   DNR--confirmed with daughter Family Communication:  Daughter updated Disposition Plan: expect 2-3 day hospitalization Consults called: palliative DVT Prophylaxis: IV heparin  05/24/2020, Robert Hurst  [facility] Pager [phone]  If 7PM-7AM, please contact night-coverage [email or web address] Password [email or web address] 05/22/2020, 3:16 PM

## 2020-05-22 NOTE — Progress Notes (Signed)
Pharmacy Antibiotic Note  Robert Hurst is a 86 y.o. male admitted on 05/22/2020 with unknown source of infection.  Pharmacy has been consulted for Vancomycin and Cefepime dosing.  Plan: Vancomycin 1250 mg IV x 1 dose. Vancomycin 750 mg IV every 48 hours. Expected AUC 449. Cefepime 2000 mg IV every 24 hours. Monitor labs, c/s, and vanco level as indicated.  Height: 5\' 4"  (162.6 cm) Weight: 54.4 kg (120 lb) IBW/kg (Calculated) : 59.2  Temp (24hrs), Avg:96.8 F (36 C), Min:96.8 F (36 C), Max:96.8 F (36 C)  Recent Labs  Lab 05/22/20 1316  WBC 7.4  CREATININE 1.90*    Estimated Creatinine Clearance: 21.5 mL/min (A) (by C-G formula based on SCr of 1.9 mg/dL (H)).    Allergies  Allergen Reactions  . Penicillins     Has patient had a PCN reaction causing immediate rash, facial/tongue/throat swelling, SOB or lightheadedness with hypotension: Unknown Has patient had a PCN reaction causing severe rash involving mucus membranes or skin necrosis: Unknown Has patient had a PCN reaction that required hospitalization: Unknown Has patient had a PCN reaction occurring within the last 10 years: Unknown If all of the above answers are "NO", then may proceed with Cephalosporin use.     Antimicrobials this admission: Vanco 8/30 >>  Cefepime 8/30 >>  Flagyl 8/30 x 1  Dose adjustments this admission: Vanco/Cefepime  Microbiology results: 8/30 BCx: pending 8/30 UCx: pending   Thank you for allowing pharmacy to be a part of this patient's care.  9/30, PharmD Clinical Pharmacist 05/22/2020 2:01 PM

## 2020-05-22 NOTE — ED Notes (Signed)
Clean dry dressing noted to left brachial from possible PICC line removal.

## 2020-05-22 NOTE — ED Provider Notes (Signed)
Emergency Department Provider Note   I have reviewed the triage vital signs and the nursing notes.   HISTORY  Chief Complaint Altered Mental Status   HPI Robert Hurst is a 86 y.o. male with past medical history reviewed below presents to the emergency department from the [facility] skilled nursing facility with report of unresponsiveness.  Staff noted the patient to be completely unresponsive but did not lose pulses.  He is cool to touch with low blood pressure and bradycardia per EMS on arrival.  He was given IV fluids and mental status improved somewhat.  Patient is unable to provide any significant history.  Level 5 caveat applies due to underlying dementia and acute encephalopathy.   I spoke with the patient's family by phone including his daughter who is listed as his emergency contact.  She confirms that the patient is DNR/DNI.  In terms of scope of care she would be okay with IV fluids through peripheral access, antibiotics, bear hugger but agrees that the patient would not want central access, pressors, ICU admission, CPR, intubation.   Past Medical History:  Diagnosis Date  . Alzheimer disease (HCC)   . Calculus of bile duct (any) with acute cholecystitis   . Cognitive communication deficit   . COVID-19   . Dementia (HCC)   . DVT (deep venous thrombosis) (HCC)   . Dysphagia   . Malnourished (HCC)   . Metabolic encephalopathy   . Protein calorie malnutrition (HCC)   . Renal disorder   . Syncope and collapse   . Thyroid disease     Patient Active Problem List   Diagnosis Date Noted  . COVID-19   . Advanced care planning/counseling discussion   . Palliative care by specialist   . Sepsis due to undetermined organism (HCC) 05/22/2020  . Hypotension due to hypovolemia   . Thrombocytopenia (HCC) 05/03/2020  . Sepsis due to Enterococcus (HCC) 05/03/2020  . Acute on chronic renal insufficiency   . UTI (urinary tract infection) 04/30/2020  . CKD (chronic kidney disease),  stage III 02/12/2020  . Hypothyroidism 02/12/2020  . History of DVT (deep vein thrombosis)   . History of COVID-19   . Acute cystitis without hematuria   . Goals of care, counseling/discussion   . Palliative care encounter   . Acute metabolic encephalopathy 08/11/2019  . Syncope and collapse 08/11/2019  . Syncope   . Dementia without behavioral disturbance (HCC) 08/08/2019  . Acute renal failure superimposed on stage 3b chronic kidney disease (HCC) 08/08/2019  . Lactic acidosis 08/08/2019  . Incontinence 08/08/2019    Past Surgical History:  Procedure Laterality Date  . APPENDECTOMY    . BALLOON DILATION N/A 01/15/2018   Procedure: BALLOON DILATION;  Surgeon: Malissa Hippo, MD;  [facility];  Service: Endoscopy;  Laterality: N/A;  . ERCP N/A 01/15/2018   Procedure: ENDOSCOPIC RETROGRADE CHOLANGIOPANCREATOGRAPHY (ERCP);  Surgeon: Malissa Hippo, MD;  Location[facility];  Service: Endoscopy;  Laterality: N/A;  . REMOVAL OF STONES N/A 01/15/2018   Procedure: REMOVAL OF STONES;  Surgeon: Malissa Hippo, MD;  Location: [facility];  Service: Endoscopy;  Laterality: N/A;  . SPHINCTEROTOMY N/A 01/15/2018   Procedure: SPHINCTEROTOMY;  Surgeon: Malissa Hippo, MD;  Location: [facility];  Service: Endoscopy;  Laterality: N/A;  . TONSILLECTOMY      Allergies Penicillins  No family history on file.  Social History Social History   Tobacco Use  . Smoking status: Never Smoker  . Smokeless  tobacco: Never Used  Vaping Use  . Vaping Use: Unknown  Substance Use Topics  . Alcohol use: Not Currently  . Drug use: Not Currently    Review of Systems  Level 5 caveat: Dementia and Encephalopathy.  ____________________________________________   PHYSICAL EXAM:  VITAL SIGNS: ED Triage Vitals  Enc Vitals Group     BP 05/22/20 1148 99/62     Pulse Rate 05/22/20 1148 (!) 51     Resp 05/22/20 1148 18     Temp 05/22/20 1158 (!) 96.8 F (36 C)     Temp Source  05/22/20 1158 Rectal     SpO2 05/22/20 1148 97 %     Weight --      Height 05/22/20 1138 5\' 4"  (1.626 m)   Constitutional: Somnolent. Well appearing and in no acute distress. Eyes: Conjunctivae are normal. Pupils are 2 mm and sluggish bilaterally.  Head: Atraumatic. Nose: No congestion/rhinnorhea. Mouth/Throat: Mucous membranes are dry.  Neck: No stridor.   Cardiovascular: Bradycardia. Good peripheral circulation. Grossly normal heart sounds.   Respiratory: Normal respiratory effort.  No retractions. Lungs CTAB. Gastrointestinal: Soft and nontender. No distention.  Musculoskeletal: No lower extremity tenderness nor edema. No gross deformities of extremities. Neurologic: Patient is drowsy and not following commands.  He is somewhat contracted in the lower extremities bilaterally.  He is moving his arms or legs with no gross unilateral weakness.  Skin:  Skin is warm, dry and intact. No rash noted.   ____________________________________________   LABS (all labs ordered are listed, but only abnormal results are displayed)  Labs Reviewed  SARS CORONAVIRUS 2 BY RT PCR (HOSPITAL ORDER, PERFORMED IN [facility]) - Abnormal; Notable for the following components:      Result Value   SARS Coronavirus 2 POSITIVE (*)    All other components within normal limits  URINALYSIS, ROUTINE W REFLEX MICROSCOPIC - Abnormal; Notable for the following components:   Hgb urine dipstick MODERATE (*)    Protein, ur 30 (*)    Leukocytes,Ua SMALL (*)    All other components within normal limits  LACTIC ACID, PLASMA - Abnormal; Notable for the following components:   Lactic Acid, Venous 5.9 (*)    All other components within normal limits  LACTIC ACID, PLASMA - Abnormal; Notable for the following components:   Lactic Acid, Venous 4.6 (*)    All other components within normal limits  COMPREHENSIVE METABOLIC PANEL - Abnormal; Notable for the following components:   Sodium 146 (*)    Glucose, Bld  128 (*)    Creatinine, Ser 1.90 (*)    GFR calc non Af Amer 31 (*)    GFR calc Af Amer 36 (*)    All other components within normal limits  HEPARIN LEVEL (UNFRACTIONATED) - Abnormal; Notable for the following components:   Heparin Unfractionated <0.10 (*)    All other components within normal limits  HEPARIN LEVEL (UNFRACTIONATED) - Abnormal; Notable for the following components:   Heparin Unfractionated >1.10 (*)    All other components within normal limits  CBC WITH DIFFERENTIAL/PLATELET - Abnormal; Notable for the following components:   Hemoglobin 12.9 (*)    Platelets 109 (*)    All other components within normal limits  COMPREHENSIVE METABOLIC PANEL - Abnormal; Notable for the following components:   Potassium 5.6 (*)    CO2 21 (*)    Glucose, Bld 144 (*)    Creatinine, Ser 1.45 (*)    Calcium 8.0 (*)  Total Protein 5.9 (*)    Albumin 2.6 (*)    GFR calc non Af Amer 43 (*)    GFR calc Af Amer 50 (*)    All other components within normal limits  C-REACTIVE PROTEIN - Abnormal; Notable for the following components:   CRP 1.9 (*)    All other components within normal limits  D-DIMER, QUANTITATIVE (NOT AT [facility]) - Abnormal; Notable for the following components:   D-Dimer, Quant 11.34 (*)    All other components within normal limits  CULTURE, BLOOD (ROUTINE X 2)  CULTURE, BLOOD (ROUTINE X 2)  URINE CULTURE  CBC WITH DIFFERENTIAL/PLATELET  PROTIME-INR  APTT  PROCALCITONIN  PROCALCITONIN  HEPARIN LEVEL (UNFRACTIONATED)  CBC  CBC WITH DIFFERENTIAL/PLATELET  COMPREHENSIVE METABOLIC PANEL  C-REACTIVE PROTEIN  D-DIMER, QUANTITATIVE (NOT AT [name])  PROCALCITONIN  HEPARIN LEVEL (UNFRACTIONATED)  CBC  ABO/RH   ____________________________________________  EKG   EKG Interpretation  Date/Time:  Monday May 22 2020 11:47:07 EDT Ventricular Rate:  50 PR Interval:    QRS Duration: 94 QT Interval:  486 QTC Calculation: 444 R Axis:   -6 Text Interpretation: Sinus  rhythm RSR' in V1 or V2, right VCD or RVH Abnormal T, consider ischemia, lateral leads Simiarlt o Aug 7th tracing. No STEMI Confirmed by Alona Bene [phone]) on 05/22/2020 1:20:08 PM       ____________________________________________  RADIOLOGY  No results found.  ____________________________________________   PROCEDURES  Procedure(s) performed:   Procedures  CRITICAL CARE Performed by: Maia Plan Total critical care time: 40 minutes Critical care time was exclusive of separately billable procedures and treating other patients. Critical care was necessary to treat or prevent imminent or life-threatening deterioration. Critical care was time spent personally by me on the following activities: development of treatment plan with patient and/or surrogate as well as nursing, discussions with consultants, evaluation of patient's response to treatment, examination of patient, obtaining history from patient or surrogate, ordering and performing treatments and interventions, ordering and review of laboratory studies, ordering and review of radiographic studies, pulse oximetry and re-evaluation of patient's condition.  Alona Bene, MD Emergency Medicine  ____________________________________________   INITIAL IMPRESSION / ASSESSMENT AND PLAN / ED COURSE  Pertinent labs & imaging results that were available during my care of the patient were reviewed by me and considered in my medical decision making (see chart for details).   Patient presents to the emergency department with altered mental status.  He is hypothermic, hypotensive, and showing signs of early sepsis.  In speaking with the daughter the patient is normally confused but talkative.  Chest x-ray with questionable pneumonia.  Ultimately Covid test coming back positive.  Initially broad-spectrum antibiotics and fluids started but can discontinue with Covid now being positive.  Had goals of care confirmation discussion with the  daughter and place the patient has DNR status in the system.   Discussed patient's case with [facility] to request admission. Patient and family (if present) updated with plan. Care transferred to [facility] service.  I reviewed all nursing notes, vitals, pertinent old records, EKGs, labs, imaging (as available).    ____________________________________________  FINAL CLINICAL IMPRESSION(S) / ED DIAGNOSES  Final diagnoses:  Somnolence  Hypotension, unspecified hypotension type  COVID-19     MEDICATIONS GIVEN DURING THIS VISIT:  Medications  lactated ringers infusion ( Intravenous Stopped 05/23/20 0803)  ceFEPIme (MAXIPIME) 2 g in sodium chloride 0.9 % 100 mL IVPB ( Intravenous Stopped 05/23/20 1246)  vancomycin (VANCOREADY) IVPB 750 mg/150 mL (has no  administration in time range)  remdesivir 100 mg in sodium chloride 0.9 % 100 mL IVPB (0 mg Intravenous Stopped 05/23/20 1216)  memantine (NAMENDA) tablet 10 mg (10 mg Oral Not Given 05/24/20 0007)  levothyroxine (SYNTHROID, LEVOTHROID) injection 25 mcg (has no administration in time range)  methylPREDNISolone sodium succinate (SOLU-MEDROL) 40 mg/mL injection 27.2 mg (27.2 mg Intravenous Given 05/24/20 0007)  ondansetron (ZOFRAN) tablet 4 mg (has no administration in time range)    Or  ondansetron (ZOFRAN) injection 4 mg (has no administration in time range)  acetaminophen (TYLENOL) tablet 650 mg (has no administration in time range)  heparin ADULT infusion 100 units/mL (25000 units/22350mL sodium chloride 0.45%) (650 Units/hr Intravenous New Bag/Given 05/24/20 0252)  lactated ringers infusion ( Intravenous New Bag/Given 05/23/20 1615)  lactated ringers bolus 1,000 mL (0 mLs Intravenous Stopped 05/22/20 1509)    And  lactated ringers bolus 1,000 mL (0 mLs Intravenous Stopped 05/22/20 1509)  ceFEPIme (MAXIPIME) 2 g in sodium chloride 0.9 % 100 mL IVPB (0 g Intravenous Stopped 05/22/20 1431)  metroNIDAZOLE (FLAGYL) IVPB 500 mg (0 mg Intravenous Stopped 05/22/20  1624)  vancomycin (VANCOREADY) IVPB 1250 mg/250 mL (0 mg Intravenous Stopped 05/22/20 1456)  dexamethasone (DECADRON) injection 10 mg (10 mg Intravenous Given 05/22/20 1517)  remdesivir 100 mg in sodium chloride 0.9 % 100 mL IVPB (0 mg Intravenous Stopped 05/22/20 2231)  heparin bolus via infusion 2,500 Units (2,500 Units Intravenous Bolus from Bag 05/22/20 2251)     Note:  This document was prepared using Dragon voice recognition software and may include unintentional dictation errors.  Alona BeneJoshua Zerenity Bowron, MD, [facility] Emergency Medicine    Elison Worrel, Arlyss RepressJoshua G, MD 05/24/20 [phone]

## 2020-05-22 NOTE — ED Notes (Signed)
CRITICAL VALUE ALERT  Critical Value:  covid positive  Date & Time Notied:  05/22/2020, 1422  Provider Notified: Dr. Jacqulyn Bath Orders Received/Actions taken: See chart

## 2020-05-22 NOTE — ED Triage Notes (Signed)
EMS reports pt resident of [facility] and was last known well at 9am.  EMS was called out for unresponsiveness.  EMS arrive, pt was unsresponsive, bp 70/50, HR 50's, cool to touch, diaphoretic, cbg 151.   EMS says pt is more responsive at this time.  EMS started 20g IV in r forearm and gave approx fluid bolus.  EMS says reports pt has dementia.

## 2020-05-22 NOTE — ED Notes (Signed)
All Blood drawn , Including second set of cultures.

## 2020-05-22 NOTE — Progress Notes (Addendum)
ANTICOAGULATION CONSULT NOTE - Initial Consult  Pharmacy Consult for heparin Indication: hx of VTE  Allergies  Allergen Reactions  . Penicillins     Has patient had a PCN reaction causing immediate rash, facial/tongue/throat swelling, SOB or lightheadedness with hypotension: Unknown Has patient had a PCN reaction causing severe rash involving mucus membranes or skin necrosis: Unknown Has patient had a PCN reaction that required hospitalization: Unknown Has patient had a PCN reaction occurring within the last 10 years: Unknown If all of the above answers are "NO", then may proceed with Cephalosporin use.     Patient Measurements: Height: 5\' 4"  (162.6 cm) Weight: 54.4 kg (120 lb) IBW/kg (Calculated) : 59.2 Heparin Dosing Weight: 54 kg  Vital Signs: Temp: 96.8 F (36 C) (08/30 1158) Temp Source: Rectal (08/30 1158) BP: 166/89 (08/30 1500) Pulse Rate: 75 (08/30 1500)  Labs: Recent Labs    05/22/20 1316  HGB 16.2  HCT 51.1  PLT 169  APTT 26  LABPROT 13.0  INR 1.0  CREATININE 1.90*    Estimated Creatinine Clearance: 21.5 mL/min (A) (by C-G formula based on SCr of 1.9 mg/dL (H)).   Medical History: Past Medical History:  Diagnosis Date  . Alzheimer disease (HCC)   . Calculus of bile duct (any) with acute cholecystitis   . Cognitive communication deficit   . COVID-19   . Dementia (HCC)   . DVT (deep venous thrombosis) (HCC)   . Dysphagia   . Malnourished (HCC)   . Metabolic encephalopathy   . Protein calorie malnutrition (HCC)   . Renal disorder   . Syncope and collapse   . Thyroid disease     Medications:  (Not in a hospital admission)   Assessment: Pharmacy consulted to dose heparin in patient with atrial fibrillation.  Patient is on Eliquis prior to admission with last dose given currently unknown as patient is not responding.    Baseline heparin level <0.10- Will not have to monitor based on aPTT    Goal of Therapy:  Heparin level 0.3-0.7  units/ml aPTT 66-102 seconds Monitor platelets by anticoagulation protocol: Yes   Plan:  Heparin bolus 2500 units x 1. Start heparin infusion at 800 units/hr Check anti-Xa level in 8 hours and daily while on heparin Continue to monitor H&H and platelets  05/24/20, PharmD Clinical Pharmacist 05/22/2020 4:26 PM

## 2020-05-22 NOTE — ED Notes (Signed)
Date and time results received: 05/22/20 1404 (use smartphrase ".now" to insert current time)  Test: Lactic Acid Critical Value: 1404  Name of Provider Notified: Dr Jacqulyn Bath  Orders Received? Or Actions Taken?:NA

## 2020-05-22 NOTE — ED Notes (Signed)
Bear hugger applied 

## 2020-05-22 NOTE — ED Notes (Signed)
Daughter aware of Covid status.

## 2020-05-23 DIAGNOSIS — F0280 Dementia in other diseases classified elsewhere without behavioral disturbance: Secondary | ICD-10-CM

## 2020-05-23 DIAGNOSIS — Z7189 Other specified counseling: Secondary | ICD-10-CM

## 2020-05-23 DIAGNOSIS — U071 COVID-19: Secondary | ICD-10-CM

## 2020-05-23 DIAGNOSIS — Z515 Encounter for palliative care: Secondary | ICD-10-CM

## 2020-05-23 DIAGNOSIS — G309 Alzheimer's disease, unspecified: Secondary | ICD-10-CM

## 2020-05-23 LAB — CBC WITH DIFFERENTIAL/PLATELET
Abs Immature Granulocytes: 0.03 10*3/uL (ref 0.00–0.07)
Basophils Absolute: 0 10*3/uL (ref 0.0–0.1)
Basophils Relative: 0 %
Eosinophils Absolute: 0 10*3/uL (ref 0.0–0.5)
Eosinophils Relative: 0 %
HCT: 39.2 % (ref 39.0–52.0)
Hemoglobin: 12.9 g/dL — ABNORMAL LOW (ref 13.0–17.0)
Immature Granulocytes: 1 %
Lymphocytes Relative: 17 %
Lymphs Abs: 1 10*3/uL (ref 0.7–4.0)
MCH: 30.1 pg (ref 26.0–34.0)
MCHC: 32.9 g/dL (ref 30.0–36.0)
MCV: 91.6 fL (ref 80.0–100.0)
Monocytes Absolute: 0.1 10*3/uL (ref 0.1–1.0)
Monocytes Relative: 1 %
Neutro Abs: 4.6 10*3/uL (ref 1.7–7.7)
Neutrophils Relative %: 81 %
Platelets: 109 10*3/uL — ABNORMAL LOW (ref 150–400)
RBC: 4.28 MIL/uL (ref 4.22–5.81)
RDW: 15.1 % (ref 11.5–15.5)
WBC: 5.7 10*3/uL (ref 4.0–10.5)
nRBC: 0 % (ref 0.0–0.2)

## 2020-05-23 LAB — C-REACTIVE PROTEIN: CRP: 1.9 mg/dL — ABNORMAL HIGH (ref <1.0)

## 2020-05-23 LAB — COMPREHENSIVE METABOLIC PANEL
ALT: 13 U/L (ref 0–44)
AST: 25 U/L (ref 15–41)
Albumin: 2.6 g/dL — ABNORMAL LOW (ref 3.5–5.0)
Alkaline Phosphatase: 62 U/L (ref 38–126)
Anion gap: 9 (ref 5–15)
BUN: 16 mg/dL (ref 8–23)
CO2: 21 mmol/L — ABNORMAL LOW (ref 22–32)
Calcium: 8 mg/dL — ABNORMAL LOW (ref 8.9–10.3)
Chloride: 108 mmol/L (ref 98–111)
Creatinine, Ser: 1.45 mg/dL — ABNORMAL HIGH (ref 0.61–1.24)
GFR calc Af Amer: 50 mL/min — ABNORMAL LOW (ref >60)
GFR calc non Af Amer: 43 mL/min — ABNORMAL LOW (ref >60)
Glucose, Bld: 144 mg/dL — ABNORMAL HIGH (ref 70–99)
Potassium: 5.6 mmol/L — ABNORMAL HIGH (ref 3.5–5.1)
Sodium: 138 mmol/L (ref 135–145)
Total Bilirubin: 0.4 mg/dL (ref 0.3–1.2)
Total Protein: 5.9 g/dL — ABNORMAL LOW (ref 6.5–8.1)

## 2020-05-23 LAB — URINE CULTURE: Culture: NO GROWTH

## 2020-05-23 LAB — HEPARIN LEVEL (UNFRACTIONATED)
Heparin Unfractionated: >1.1 IU/mL — ABNORMAL HIGH (ref 0.30–0.70)
Heparin Unfractionated: 0.38 IU/mL (ref 0.30–0.70)

## 2020-05-23 LAB — D-DIMER, QUANTITATIVE: D-Dimer, Quant: 11.34 ug/mL-FEU — ABNORMAL HIGH (ref 0.00–0.50)

## 2020-05-23 LAB — PROCALCITONIN: Procalcitonin: <0.1 ng/mL

## 2020-05-23 MED ORDER — LACTATED RINGERS IV SOLN: INTRAVENOUS | Status: DC

## 2020-05-23 MED ORDER — HEPARIN (PORCINE) 25000 UT/250ML-% IV SOLN
650.0000 [IU]/h | INTRAVENOUS | Status: DC
  Administered 2020-05-24: 650 [IU]/h via INTRAVENOUS
  Filled 2020-05-23: qty 250

## 2020-05-23 NOTE — Progress Notes (Signed)
ANTICOAGULATION CONSULT NOTE -  Pharmacy Consult for heparin Indication: hx of VTE  Allergies  Allergen Reactions  . Penicillins Other (See Comments)    Unknown reaction    Patient Measurements: Height: 5\' 4"  (162.6 cm) Weight: 54.4 kg (120 lb) IBW/kg (Calculated) : 59.2 Heparin Dosing Weight: 54 kg  Vital Signs: Temp: 98.6 F (37 C) (08/31 0558) Temp Source: Rectal (08/31 0558) BP: 115/56 (08/31 1500) Pulse Rate: 50 (08/31 1430)  Labs: Recent Labs    05/22/20 1316 05/23/20 0615 05/23/20 1500  HGB 16.2 12.9*  --   HCT 51.1 39.2  --   PLT 169 109*  --   APTT 26  --   --   LABPROT 13.0  --   --   INR 1.0  --   --   HEPARINUNFRC <0.10* >1.10* 0.38  CREATININE 1.90* 1.45*  --     Estimated Creatinine Clearance: 28.1 mL/min (A) (by C-G formula based on SCr of 1.45 mg/dL (H)).   Medical History: Past Medical History:  Diagnosis Date  . Alzheimer disease (HCC)   . Calculus of bile duct (any) with acute cholecystitis   . Cognitive communication deficit   . COVID-19   . Dementia (HCC)   . DVT (deep venous thrombosis) (HCC)   . Dysphagia   . Malnourished (HCC)   . Metabolic encephalopathy   . Protein calorie malnutrition (HCC)   . Renal disorder   . Syncope and collapse   . Thyroid disease     Medications:  (Not in a hospital admission)   Assessment: Pharmacy consulted to dose heparin in patient with atrial fibrillation.  Patient is on Eliquis prior to admission with last dose given currently unknown as patient is not responding.   Baseline heparin level <0.10- Will not have to monitor based on aPTT Heparin level this AM is supratherapeutic.   HL 0.38, therapeutic   Goal of Therapy:  Heparin level 0.3-0.7 units/ml aPTT 66-102 seconds Monitor platelets by anticoagulation protocol: Yes   Plan:  Continue heparin infusion at 650 units/hr Check anti-Xa level in 8 hours and daily while on heparin Continue to monitor H&H and platelets  05/25/20,  PharmD, MBA, BCGP Clinical Pharmacist  05/23/2020 4:19 PM

## 2020-05-23 NOTE — Progress Notes (Signed)
PROGRESS NOTE    Robert Hurst  NOI:[id number] DOB: [birth year] DOA: 05/22/2020 PCP: [name], MD   Brief Narrative:  Per HPI: Robert Hurst is a 86 y.o. male with medical history of Alzheimer's dementia, history of stroke COVID-19, history of DVT on Eliquis, hypothyroidism, patient is demented, nonverbal, unable to provide any history, history was obtained from ED staff and medical records, patient was sent by facility given he was lethargic, cold and clammy and nonverbal.  At baseline, the patient is able to say a few words.  Nevertheless, the patient had recent admission to the hospital from 04/29/2020 to 05/04/2020 secondary to a similar presentation when he was noted to be hypotensive, lethargic, and clammy with a rectal temperature of 96 F.  He was treated for sepsis secondary to Enterococcus faecalis UTI and subsequently sent back to his nursing facility. In the emergency department, the patient was noted to be hypothermic with a temperature of 96 point degrees Fahrenheit.  He was initially hypotensive with a systolic blood pressure in the 70s.  Oxygen saturation varied from 90 to 94% on room air.  The patient was noted to be COVID-19 positive.  He was started on remdesivir and steroids.  In addition, UA showed 11-20 WBC.  Lactic acid was 5.9.  Chest x-ray showed bibasilar opacities.  The patient was started on vancomycin, cefepime, and metronidazole.  BP responded well to 2L LR improving to low 100s.  8/31: Patient readmitted for severe sepsis in the setting of a urinary source versus possible pneumonia.  Procalcitonin noted to be low this morning and D-dimer elevated at 11.34.  Plan to still continue antibiotics as ordered.  Lactic acid noted to be 4.6 yesterday, plan to continue to follow and repeat.  Patient will need palliative consultation which is still pending, he seems to be an appropriate candidate for hospice.  Assessment & Plan:   Active Problems:   Acute renal failure superimposed  on stage 3b chronic kidney disease (HCC)   Lactic acidosis   Acute metabolic encephalopathy   Hypothyroidism   UTI (urinary tract infection)   Thrombocytopenia (HCC)   Sepsis due to undetermined organism (HCC)  Severe sepsis -Presented with hypothermia, hypotension, elevated lactic acid -Likely secondary to urinary source and possible pneumonia -Procalcitonin<0.10 -Lactic acid peaked at 5.9>>4.6 -Check inflammatory markers for COVID-19 -Follow cultures which are pending -Continue empiric vancomycin and cefepime pending culture data -Continue IV fluids, now to LR due to some hyperkalemia -Palliative consultation still pending  COVID-19 pneumonia -Personally reviewed chest x-ray--bibasilar opacities -Oxygen saturation varying from 90-94% -Continue remdesivir and steroids -The patient has listed in his past medical history that he has had COVID-19 in the past, but I do not have any records to validate this from his laboratory evaluations -Check daily inflammatory markers  Acute on chronic renal failure--CKD stage IIIa-improving -Secondary to sepsis and dehydration -Baseline creatinine 1.1-1.3, currently 1.45 -Presented with serum creatinine 1.90  Hypothyroidism -Continue levothyroxine IV until the patient is awake enough to tolerate oral medications  acute metabolic encephalopathy -Secondary tosepsis -continue synthroid  Dementiawithout behavioral disturbance -Continuenamenda -Constant reorientation and supportive care. -Appreciate evaluation by speech therapy who has recommended dysphagia 2 diet with nectar thick liquids.  hx of DVT -continue Eliquis when able to tolerate po -IV heparin for now  Thrombocytopenia-downtrending -chronic dating back to nov 2020 -Continue monitor carefully while on heparin drip -Anticipate transition back to Eliquis once able to tolerate p.o. with AMS improvement  Goals of Care -DNR confirmed by daughter--she  would not want  vasopressors, ICU, central line  DVT prophylaxis: Heparin drip Code Status: DNR Family Communication: Tried calling daughter 8/31 with no response Disposition Plan:  Status is: Inpatient  Remains inpatient appropriate because:Altered mental status, IV treatments appropriate due to intensity of illness or inability to take PO and Inpatient level of care appropriate due to severity of illness   Dispo: The patient is from: SNF              Anticipated d/c is to: SNF              Anticipated d/c date is: 3 days              Patient currently is not medically stable to d/c.   Consultants:   Palliative care  Procedures:   See below  Antimicrobials:  Anti-infectives (From admission, onward)   Start     Dose/Rate Route Frequency Ordered Stop   05/24/20 1200  vancomycin (VANCOREADY) IVPB 750 mg/150 mL        750 mg 150 mL/hr over 60 Minutes Intravenous Every 48 hours 05/22/20 1359     05/23/20 1300  ceFEPIme (MAXIPIME) 2 g in sodium chloride 0.9 % 100 mL IVPB        2 g 200 mL/hr over 30 Minutes Intravenous Every 24 hours 05/22/20 1356     05/23/20 1000  remdesivir 100 mg in sodium chloride 0.9 % 100 mL IVPB        100 mg 200 mL/hr over 30 Minutes Intravenous Daily 05/22/20 1428 05/27/20 0959   05/23/20 1000  remdesivir 100 mg in sodium chloride 0.9 % 100 mL IVPB  Status:  Discontinued       "Followed by" Linked Group Details   100 mg 200 mL/hr over 30 Minutes Intravenous Daily 05/22/20 2156 05/22/20 2204   05/23/20 0200  ceFEPIme (MAXIPIME) 2 g in sodium chloride 0.9 % 100 mL IVPB  Status:  Discontinued        2 g 200 mL/hr over 30 Minutes Intravenous Every 12 hours 05/22/20 1312 05/22/20 1356   05/22/20 2156  remdesivir 200 mg in sodium chloride 0.9% 250 mL IVPB  Status:  Discontinued       "Followed by" Linked Group Details   200 mg 580 mL/hr over 30 Minutes Intravenous Once 05/22/20 2156 05/22/20 2204   05/22/20 1500  remdesivir 100 mg in sodium chloride 0.9 % 100 mL IVPB         100 mg 200 mL/hr over 30 Minutes Intravenous Every 1 hr x 2 05/22/20 1428 05/22/20 2231   05/22/20 1230  vancomycin (VANCOREADY) IVPB 1250 mg/250 mL        1,250 mg 166.7 mL/hr over 90 Minutes Intravenous  Once 05/22/20 1221 05/22/20 1456   05/22/20 1215  ceFEPIme (MAXIPIME) 2 g in sodium chloride 0.9 % 100 mL IVPB        2 g 200 mL/hr over 30 Minutes Intravenous  Once 05/22/20 1212 05/22/20 1431   05/22/20 1215  metroNIDAZOLE (FLAGYL) IVPB 500 mg        500 mg 100 mL/hr over 60 Minutes Intravenous  Once 05/22/20 1212 05/22/20 1624   05/22/20 1215  vancomycin (VANCOCIN) IVPB 1000 mg/200 mL premix  Status:  Discontinued        1,000 mg 200 mL/hr over 60 Minutes Intravenous  Once 05/22/20 1212 05/22/20 1221       Subjective: Patient seen and evaluated today and is very poorly responsive and does  not have meaningful interaction.  He is curled up in fetal position and appears to be somnolent.  No acute concerns or events noted by nursing staff.  Objective: Vitals:   05/23/20 0630 05/23/20 0700 05/23/20 0715 05/23/20 0730  BP: 134/63 109/62  112/66  Pulse: 66 (!) 48  (!) 59  Resp: 18 13  (!) 22  Temp:      TempSrc:      SpO2:   96% 97%  Weight:      Height:        Intake/Output Summary (Last 24 hours) at 05/23/2020 0739 Last data filed at 05/22/2020 2231 Gross per 24 hour  Intake 8214.98 ml  Output --  Net 8214.98 ml   Filed Weights   05/22/20 1300  Weight: 54.4 kg    Examination:  General exam: Appears calm and comfortable, currently curled up and mostly somnolent. Respiratory system: Clear to auscultation. Respiratory effort normal.  Currently on room air. Cardiovascular system: S1 & S2 heard, RRR.  Gastrointestinal system: Abdomen is nondistended, soft and nontender.  Central nervous system: Somnolent but arousable Extremities: No edema Skin: No rashes, lesions or ulcers Psychiatry: Cannot be assessed given current condition    Data Reviewed: I have  personally reviewed following labs and imaging studies  CBC: Recent Labs  Lab 05/22/20 1316 05/23/20 0615  WBC 7.4 5.7  NEUTROABS 5.0 4.6  HGB 16.2 12.9*  HCT 51.1 39.2  MCV 92.9 91.6  PLT 169 109*   Basic Metabolic Panel: Recent Labs  Lab 05/22/20 1316 05/23/20 0615  NA 146* 138  K 3.6 5.6*  CL 107 108  CO2 25 21*  GLUCOSE 128* 144*  BUN 16 16  CREATININE 1.90* 1.45*  CALCIUM 9.8 8.0*   GFR: Estimated Creatinine Clearance: 28.1 mL/min (A) (by C-G formula based on SCr of 1.45 mg/dL (H)). Liver Function Tests: Recent Labs  Lab 05/22/20 1316 05/23/20 0615  AST 26 25  ALT 14 13  ALKPHOS 91 62  BILITOT 0.6 0.4  PROT 8.0 5.9*  ALBUMIN 3.7 2.6*   No results for input(s): LIPASE, AMYLASE in the last 168 hours. No results for input(s): AMMONIA in the last 168 hours. Coagulation Profile: Recent Labs  Lab 05/22/20 1316  INR 1.0   Cardiac Enzymes: No results for input(s): CKTOTAL, CKMB, CKMBINDEX, TROPONINI in the last 168 hours. BNP (last 3 results) No results for input(s): PROBNP in the last 8760 hours. HbA1C: No results for input(s): HGBA1C in the last 72 hours. CBG: No results for input(s): GLUCAP in the last 168 hours. Lipid Profile: No results for input(s): CHOL, HDL, LDLCALC, TRIG, CHOLHDL, LDLDIRECT in the last 72 hours. Thyroid Function Tests: No results for input(s): TSH, T4TOTAL, FREET4, T3FREE, THYROIDAB in the last 72 hours. Anemia Panel: No results for input(s): VITAMINB12, FOLATE, FERRITIN, TIBC, IRON, RETICCTPCT in the last 72 hours. Sepsis Labs: Recent Labs  Lab 05/22/20 1316 05/22/20 1525 05/23/20 0615  PROCALCITON <0.10  --  <0.10  LATICACIDVEN 5.9* 4.6*  --     Recent Results (from the past 240 hour(s))  SARS Coronavirus 2 by RT PCR (hospital order, [facility]) Nasopharyngeal Nasopharyngeal Swab     Status: Abnormal   Collection Time: 05/22/20 12:14 PM   Specimen: Nasopharyngeal Swab  Result Value Ref  Range Status   SARS Coronavirus 2 POSITIVE (A) NEGATIVE Final    Comment: CRITICAL RESULT CALLED TO, READ BACK BY AND VERIFIED WITH: CRADWELL,L AT 1416 ON 8.30.21 BY ISLEY,B (NOTE)  SARS-CoV-2 target nucleic acids are DETECTED  SARS-CoV-2 RNA is generally detectable in upper respiratory specimens  during the acute phase of infection.  Positive results are indicative  of the presence of the identified virus, but do not rule out bacterial infection or co-infection with other pathogens not detected by the test.  Clinical correlation with patient history and  other diagnostic information is necessary to determine patient infection status.  The expected result is negative.  Fact Sheet for Patients:   BoilerBrush.com.cy   Fact Sheet for Healthcare Providers:   https://pope.com/    This test is not yet approved or cleared by the [place] FDA and  has been authorized for detection and/or diagnosis of SARS-CoV-2 by FDA under an Emergency Use Authorization (EUA).  This EUA will remain in effect ( meaning this test can be used) for the duration of  the COVID-19 declaration under Section 564(b)(1) of the Act, 21 U.S.C. section 360-bbb-3(b)(1), unless the authorization is terminated or revoked sooner.  Performed at [facility], [address], [place], [place] [address]   Blood Culture (routine x 2)     Status: None (Preliminary result)   Collection Time: 05/22/20  1:17 PM   Specimen: BLOOD  Result Value Ref Range Status   Specimen Description BLOOD RIGHT ANTECUBITAL  Final   Special Requests   Final    BOTTLES DRAWN AEROBIC AND ANAEROBIC Blood Culture results may not be optimal due to an inadequate volume of blood received in culture bottles   Culture   Final    NO GROWTH < 24 HOURS Performed at [facility], [address], [place], [place] [address]    Report Status PENDING  Incomplete  Blood Culture (routine x 2)     Status: None  (Preliminary result)   Collection Time: 05/22/20  1:18 PM   Specimen: BLOOD  Result Value Ref Range Status   Specimen Description BLOOD LEFT ANTECUBITAL  Final   Special Requests   Final    BOTTLES DRAWN AEROBIC AND ANAEROBIC Blood Culture results may not be optimal due to an inadequate volume of blood received in culture bottles   Culture   Final    NO GROWTH < 24 HOURS Performed at [facility], [address] [place], [place] [address]    Report Status PENDING  Incomplete         Radiology Studies: CT Head Wo Contrast  Result Date: 05/22/2020 CLINICAL DATA:  86 year old male with altered mental status. EXAM: CT HEAD WITHOUT CONTRAST TECHNIQUE: Contiguous axial images were obtained from the base of the skull through the vertex without intravenous contrast. COMPARISON:  02/12/2020 and prior exams FINDINGS: Brain: No evidence of acute infarction, hemorrhage, hydrocephalus, extra-axial collection or mass lesion/mass effect. Atrophy and chronic small-vessel white matter ischemic changes again noted. Vascular: Carotid atherosclerotic calcifications are noted. Skull: Normal. Negative for fracture or focal lesion. Sinuses/Orbits: No acute finding. Other: None. IMPRESSION: 1. No evidence of acute intracranial abnormality. 2. Atrophy and chronic small-vessel white matter ischemic changes. Electronically Signed   By: Harmon Pier M.D.   On: 05/22/2020 16:28   DG Chest Port 1 View  Result Date: 05/22/2020 CLINICAL DATA:  Shortness of breath. EXAM: PORTABLE CHEST 1 VIEW COMPARISON:  April 29, 2020. FINDINGS: Stable cardiomediastinal silhouette. No pneumothorax is noted. Minimal right basilar subsegmental atelectasis is noted. Mild left basilar atelectasis or infiltrate is noted with probable small left pleural effusion. Bony thorax is unremarkable. IMPRESSION: Minimal right basilar subsegmental atelectasis. Mild left basilar atelectasis  or infiltrate is noted with probable small left pleural effusion.  Aortic Atherosclerosis (ICD10-I70.0). Electronically Signed   By: Lupita Raider M.D.   On: 05/22/2020 12:28        Scheduled Meds: . [START ON 05/25/2020] levothyroxine  25 mcg Intravenous Daily  . memantine  10 mg Oral BID  . methylPREDNISolone (SOLU-MEDROL) injection  0.5 mg/kg Intravenous Q12H   Continuous Infusions: . 0.45 % NaCl with KCl 20 mEq / L 100 mL/hr at 05/23/20 0107  . ceFEPime (MAXIPIME) IV    . heparin 800 Units/hr (05/23/20 1610)  . lactated ringers 150 mL/hr at 05/23/20 0034  . remdesivir 100 mg in NS 100 mL    . [START ON 05/24/2020] vancomycin       LOS: 1 day    Time spent: 35 minutes    Laiken Nohr Hoover Brunette, DO [facility]  If 7PM-7AM, please contact night-coverage [email or web address] 05/23/2020, 7:39 AM

## 2020-05-23 NOTE — Progress Notes (Signed)
ANTICOAGULATION CONSULT NOTE -  Pharmacy Consult for heparin Indication: hx of VTE  Allergies  Allergen Reactions  . Penicillins Other (See Comments)    Unknown reaction    Patient Measurements: Height: 5\' 4"  (162.6 cm) Weight: 54.4 kg (120 lb) IBW/kg (Calculated) : 59.2 Heparin Dosing Weight: 54 kg  Vital Signs: Temp: 98.6 F (37 C) (08/31 0558) Temp Source: Rectal (08/31 0558) BP: 112/66 (08/31 0730) Pulse Rate: 59 (08/31 0730)  Labs: Recent Labs    05/22/20 1316 05/23/20 0615  HGB 16.2 12.9*  HCT 51.1 39.2  PLT 169 109*  APTT 26  --   LABPROT 13.0  --   INR 1.0  --   HEPARINUNFRC <0.10* >1.10*  CREATININE 1.90* 1.45*    Estimated Creatinine Clearance: 28.1 mL/min (A) (by C-G formula based on SCr of 1.45 mg/dL (H)).   Medical History: Past Medical History:  Diagnosis Date  . Alzheimer disease (HCC)   . Calculus of bile duct (any) with acute cholecystitis   . Cognitive communication deficit   . COVID-19   . Dementia (HCC)   . DVT (deep venous thrombosis) (HCC)   . Dysphagia   . Malnourished (HCC)   . Metabolic encephalopathy   . Protein calorie malnutrition (HCC)   . Renal disorder   . Syncope and collapse   . Thyroid disease     Medications:  (Not in a hospital admission)   Assessment: Pharmacy consulted to dose heparin in patient with atrial fibrillation.  Patient is on Eliquis prior to admission with last dose given currently unknown as patient is not responding.   Baseline heparin level <0.10- Will not have to monitor based on aPTT Heparin level this AM is supratherapeutic.    Goal of Therapy:  Heparin level 0.3-0.7 units/ml aPTT 66-102 seconds Monitor platelets by anticoagulation protocol: Yes   Plan:  Hold heparin for 1 hour then Decrease heparin infusion at 650 units/hr Check anti-Xa level in 8 hours and daily while on heparin Continue to monitor H&H and platelets  05/25/20, BS Elder Cyphers, BCPS Clinical Pharmacist Pager  [phone] 05/23/2020 7:39 AM

## 2020-05-23 NOTE — Consult Note (Signed)
Consultation Note Date: 05/23/2020   Patient Name: Robert Hurst  DOB: 01/30/1932  MRN: [id number]  Age / Sex: 86 y.o., male  PCP: [name], MD Referring Physician: Erick Blinks, DO  Reason for Consultation: Establishing goals of care  HPI/Patient Profile: 86 y.o. male  with past medical history of dementia, recent admission for UTI sepsis, stroke, DVT, CKD admitted on 05/22/2020 from [facility] with lethargy, hypotension, mental status change. Workup reveals recurrent severe sepsis r/t UTI vs pneumonia. He is Covid-19 positive. Palliative medicine consulted for goals of care.   Clinical Assessment and Goals of Care: Evaluated patient in ED and then spoke with his daughters [name] and [name].  [name] did not arouse to my voice or touch- however, RN reports that he was awake earlier and stated he was hungry. [name] and [name] are familiar with Palliative team and had many conversations regarding goals of care and scope of interventions, likelihood of recurrent infections and sepsis during his last admission. It was noted they were hopefull for improvement- but would consider comfort/hospice if he declined. We discussed his current condition and comorbidities. Family expressed surprise at seeing him decline so quickly. They note that they visited him on Saturday and he was awake, alert, talking with them, and excited about eating. However, there was a significant change on Sunday. [name] and [name] express they feel as though "this time is different" than the last time he was admitted. We discussed goals of care and the fact that this illness- if he is able to survive would likely leave him in a much worse functional state than he was previously.  Continued aggressive care vs comfort was discussed.   Primary Decision Maker NEXT OF KIN- daughters [name] and [name]    SUMMARY OF  RECOMMENDATIONS -DNR -Family requests to continue current interventions for 24-48 hours and consider transition to comfort and hospice if patient does not improve -PMT will follow and continue GOC discussion with family    Code Status/Advance Care Planning:  DNR  Palliative Prophylaxis:   Delirium Protocol and Frequent Pain Assessment  Prognosis:    Unable to determine  Discharge Planning: Skilled Nursing Facility for rehab with Palliative care service follow-up vs Hospice  Primary Diagnoses: Present on Admission: . Sepsis due to undetermined organism (HCC) . Lactic acidosis . Acute renal failure superimposed on stage 3b chronic kidney disease (HCC) . Acute metabolic encephalopathy . Hypothyroidism . UTI (urinary tract infection) . Thrombocytopenia (HCC)   I have reviewed the medical record, interviewed the patient and family, and examined the patient. The following aspects are pertinent.  Past Medical History:  Diagnosis Date  . Alzheimer disease (HCC)   . Calculus of bile duct (any) with acute cholecystitis   . Cognitive communication deficit   . COVID-19   . Dementia (HCC)   . DVT (deep venous thrombosis) (HCC)   . Dysphagia   . Malnourished (HCC)   . Metabolic encephalopathy   . Protein calorie malnutrition (HCC)   . Renal disorder   .  Syncope and collapse   . Thyroid disease    Social History   Socioeconomic History  . Marital status: Widowed    Spouse name: Not on file  . Number of children: Not on file  . Years of education: Not on file  . Highest education level: Not on file  Occupational History  . Not on file  Tobacco Use  . Smoking status: Never Smoker  . Smokeless tobacco: Never Used  Vaping Use  . Vaping Use: Unknown  Substance and Sexual Activity  . Alcohol use: Not Currently  . Drug use: Not Currently  . Sexual activity: Not Currently  Other Topics Concern  . Not on file  Social History Narrative  . Not on file   Social  Determinants of Health   Financial Resource Strain:   . Difficulty of Paying Living Expenses: Not on file  Food Insecurity:   . Worried About Programme researcher, broadcasting/film/video in the Last Year: Not on file  . Ran Out of Food in the Last Year: Not on file  Transportation Needs:   . Lack of Transportation (Medical): Not on file  . Lack of Transportation (Non-Medical): Not on file  Physical Activity:   . Days of Exercise per Week: Not on file  . Minutes of Exercise per Session: Not on file  Stress:   . Feeling of Stress : Not on file  Social Connections:   . Frequency of Communication with Friends and Family: Not on file  . Frequency of Social Gatherings with Friends and Family: Not on file  . Attends Religious Services: Not on file  . Active Member of Clubs or Organizations: Not on file  . Attends Banker Meetings: Not on file  . Marital Status: Not on file   No family history on file. Scheduled Meds: . [START ON 05/25/2020] levothyroxine  25 mcg Intravenous Daily  . memantine  10 mg Oral BID  . methylPREDNISolone (SOLU-MEDROL) injection  0.5 mg/kg Intravenous Q12H   Continuous Infusions: . ceFEPime (MAXIPIME) IV 2 g (05/23/20 1215)  . heparin 650 Units/hr (05/23/20 0950)  . lactated ringers 75 mL/hr at 05/23/20 0804  . remdesivir 100 mg in NS 100 mL Stopped (05/23/20 1216)  . [START ON 05/24/2020] vancomycin     PRN Meds:.acetaminophen, ondansetron **OR** ondansetron (ZOFRAN) IV Medications Prior to Admission:  Prior to Admission medications   Medication Sig Start Date End Date Taking? Authorizing Provider  apixaban (ELIQUIS) 2.5 MG TABS tablet Take 1 tablet (2.5 mg total) by mouth 2 (two) times daily. 08/17/19 04/30/21 Yes Shah, Pratik D, DO  levothyroxine (SYNTHROID) 50 MCG tablet Take 1 tablet (50 mcg total) by mouth daily at 6 (six) AM. 05/05/20  Yes Tat, Onalee Hua, MD  memantine (NAMENDA) 10 MG tablet Take 10 mg by mouth 2 (two) times daily.   Yes [provider]   polyethylene glycol (MIRALAX / GLYCOLAX) 17 g packet Take 17 g by mouth daily.   Yes [provider]  senna (SENOKOT) 8.6 MG tablet Take 1 tablet by mouth daily.   Yes [provider]   Allergies  Allergen Reactions  . Penicillins Other (See Comments)    Unknown reaction   Review of Systems  Unable to perform ROS: Dementia    Physical Exam Vitals reviewed.  Constitutional:      Appearance: He is ill-appearing.  Cardiovascular:     Pulses: Normal pulses.  Pulmonary:     Effort: Pulmonary effort is normal.  Neurological:  Comments: Would not arouse     Vital Signs: BP 111/74   Pulse 66   Temp 98.6 F (37 C) (Rectal)   Resp 16   Ht 5\' 4"  (1.626 m)   Wt 54.4 kg   SpO2 96%   BMI 20.60 kg/m  Pain Scale: 0-10   Pain Score: Asleep   SpO2: SpO2: 96 % O2 Device:SpO2: 96 % O2 Flow Rate: .   IO: Intake/output summary:   Intake/Output Summary (Last 24 hours) at 05/23/2020 1301 Last data filed at 05/23/2020 1216 Gross per 24 hour  Intake 11415.84 ml  Output --  Net 11415.84 ml    LBM:   Baseline Weight: Weight: 54.4 kg Most recent weight: Weight: 54.4 kg     Palliative Assessment/Data: PPS: 20%     Thank you for this consult. Palliative medicine will continue to follow and assist as needed.   Time Total: 73 minutes  Greater than 50%  of this time was spent counseling and coordinating care related to the above assessment and plan.  Signed by: 05/25/2020, AGNP-C Palliative Medicine    Please contact Palliative Medicine Team phone at [phone] for questions and concerns.  For individual provider: See [phone]

## 2020-05-24 DIAGNOSIS — E039 Hypothyroidism, unspecified: Secondary | ICD-10-CM

## 2020-05-24 LAB — CBC WITH DIFFERENTIAL/PLATELET
Abs Immature Granulocytes: 0.05 10*3/uL (ref 0.00–0.07)
Basophils Absolute: 0 10*3/uL (ref 0.0–0.1)
Basophils Relative: 0 %
Eosinophils Absolute: 0 10*3/uL (ref 0.0–0.5)
Eosinophils Relative: 0 %
HCT: 41.7 % (ref 39.0–52.0)
Hemoglobin: 13.4 g/dL (ref 13.0–17.0)
Immature Granulocytes: 0 %
Lymphocytes Relative: 9 %
Lymphs Abs: 1.1 10*3/uL (ref 0.7–4.0)
MCH: 30.1 pg (ref 26.0–34.0)
MCHC: 32.1 g/dL (ref 30.0–36.0)
MCV: 93.7 fL (ref 80.0–100.0)
Monocytes Absolute: 0.7 10*3/uL (ref 0.1–1.0)
Monocytes Relative: 6 %
Neutro Abs: 10.7 10*3/uL — ABNORMAL HIGH (ref 1.7–7.7)
Neutrophils Relative %: 85 %
Platelets: 137 10*3/uL — ABNORMAL LOW (ref 150–400)
RBC: 4.45 MIL/uL (ref 4.22–5.81)
RDW: 14.9 % (ref 11.5–15.5)
WBC: 12.5 10*3/uL — ABNORMAL HIGH (ref 4.0–10.5)
nRBC: 0 % (ref 0.0–0.2)

## 2020-05-24 LAB — COMPREHENSIVE METABOLIC PANEL
ALT: 12 U/L (ref 0–44)
AST: 23 U/L (ref 15–41)
Albumin: 2.9 g/dL — ABNORMAL LOW (ref 3.5–5.0)
Alkaline Phosphatase: 65 U/L (ref 38–126)
Anion gap: 17 — ABNORMAL HIGH (ref 5–15)
BUN: 20 mg/dL (ref 8–23)
CO2: 20 mmol/L — ABNORMAL LOW (ref 22–32)
Calcium: 8.9 mg/dL (ref 8.9–10.3)
Chloride: 109 mmol/L (ref 98–111)
Creatinine, Ser: 1.48 mg/dL — ABNORMAL HIGH (ref 0.61–1.24)
GFR calc Af Amer: 49 mL/min — ABNORMAL LOW (ref >60)
GFR calc non Af Amer: 42 mL/min — ABNORMAL LOW (ref >60)
Glucose, Bld: 113 mg/dL — ABNORMAL HIGH (ref 70–99)
Potassium: 4.2 mmol/L (ref 3.5–5.1)
Sodium: 146 mmol/L — ABNORMAL HIGH (ref 135–145)
Total Bilirubin: 0.7 mg/dL (ref 0.3–1.2)
Total Protein: 6.4 g/dL — ABNORMAL LOW (ref 6.5–8.1)

## 2020-05-24 LAB — C-REACTIVE PROTEIN: CRP: 1.3 mg/dL — ABNORMAL HIGH (ref <1.0)

## 2020-05-24 LAB — HEPARIN LEVEL (UNFRACTIONATED): Heparin Unfractionated: 0.37 IU/mL (ref 0.30–0.70)

## 2020-05-24 LAB — PROCALCITONIN: Procalcitonin: <0.1 ng/mL

## 2020-05-24 LAB — D-DIMER, QUANTITATIVE: D-Dimer, Quant: 8.47 ug/mL-FEU — ABNORMAL HIGH (ref 0.00–0.50)

## 2020-05-24 MED ORDER — APIXABAN 2.5 MG PO TABS
2.5000 mg | ORAL_TABLET | Freq: Two times a day (BID) | ORAL | Status: DC
  Administered 2020-05-24 – 2020-05-26 (×4): 2.5 mg via ORAL
  Filled 2020-05-24 (×5): qty 1

## 2020-05-24 MED ORDER — MEMANTINE HCL 10 MG PO TABS
5.0000 mg | ORAL_TABLET | Freq: Two times a day (BID) | ORAL | Status: DC
  Administered 2020-05-24 – 2020-05-26 (×4): 5 mg via ORAL
  Filled 2020-05-24 (×4): qty 1

## 2020-05-24 MED ORDER — LEVOTHYROXINE SODIUM 50 MCG PO TABS
50.0000 ug | ORAL_TABLET | Freq: Every day | ORAL | Status: DC
  Administered 2020-05-25 – 2020-05-26 (×2): 50 ug via ORAL
  Filled 2020-05-24 (×2): qty 1

## 2020-05-24 NOTE — Progress Notes (Signed)
PROGRESS NOTE    Robert Hurst  QMV:[id number] DOB: 04/19/1932 DOA: 05/22/2020 PCP: [name], MD   Brief Narrative:  Per HPI: Robert Hurst is a 86 y.o. male with medical history of Alzheimer's dementia, history of stroke COVID-19, history of DVT on Eliquis, hypothyroidism, patient is demented, nonverbal, unable to provide any history, history was obtained from ED staff and medical records, patient was sent by facility given he was lethargic, cold and clammy and nonverbal.  At baseline, the patient is able to say a few words.  Nevertheless, the patient had recent admission to the hospital from 04/29/2020 to 05/04/2020 secondary to a similar presentation when he was noted to be hypotensive, lethargic, and clammy with a rectal temperature of 96 F.  He was treated for sepsis secondary to Enterococcus faecalis UTI and subsequently sent back to his nursing facility. In the emergency department, the patient was noted to be hypothermic with a temperature of 96 point degrees Fahrenheit.  He was initially hypotensive with a systolic blood pressure in the 70s.  Oxygen saturation varied from 90 to 94% on room air.  The patient was noted to be COVID-19 positive.  He was started on remdesivir and steroids.  In addition, UA showed 11-20 WBC.  Lactic acid was 5.9.  Chest x-ray showed bibasilar opacities.  The patient was started on vancomycin, cefepime, and metronidazole.  BP responded well to 2L LR improving to low 100s.  8/31: Patient readmitted for severe sepsis in the setting of a urinary source versus possible pneumonia.  Procalcitonin noted to be low this morning and D-dimer elevated at 11.34.  Plan to still continue antibiotics as ordered.  Lactic acid noted to be 4.6 yesterday, plan to continue to follow and repeat.  Patient will need palliative consultation which is still pending, he seems to be an appropriate candidate for hospice.  9/1:  Pt able to eat again.  Palliative care discussions in progress  regarding goals of care.    Assessment & Plan:   Active Problems:   Acute renal failure superimposed on stage 3b chronic kidney disease (HCC)   Lactic acidosis   Acute metabolic encephalopathy   Hypothyroidism   UTI (urinary tract infection)   Thrombocytopenia (HCC)   Sepsis due to undetermined organism (HCC)  Severe sepsis secondary to Covid 19 virus -Presented with hypothermia, hypotension, elevated lactic acid -Likely secondary to urinary source and possible pneumonia -Procalcitonin<0.10 -Lactic acid peaked at 5.9>>4.6 -Follow inflammatory markers for COVID-19 -Following cultures which are no growth to date -Empiric vancomycin and cefepime was started pending culture data, with negative cultures, will DC 9/1 -Continue IV fluids.  -Palliative consultation appreciated, goals of care discussions in progress  COVID-19 pneumonia -Personally reviewed chest x-ray--bibasilar opacities -Oxygen saturation varying from 90-94% -Continue remdesivir and steroids -The patient has listed in his past medical history that he has had COVID-19 in the past, but I do not have any records to validate this from his laboratory evaluations -Following daily inflammatory markers  Acute on chronic renal failure--CKD stage IIIa-improving -Secondary to sepsis and dehydration -Baseline creatinine 1.1-1.3, currently 1.48 -Presented with serum creatinine 1.90  Hypothyroidism -resume home oral levothyroxine  acute metabolic encephalopathy -Secondary tosepsis  Dementiawithout behavioral disturbance -Continuenamenda -Constant reorientation and supportive care. -Appreciate evaluation by speech therapy who has recommended dysphagia 2 diet with nectar thick liquids.  hx of DVT -continue Eliquis when able to tolerate po -IV heparin now, transition back to home apixaban.   Thrombocytopenia-downtrending -chronic dating back to nov 2020 -  Continue monitor carefully while on heparin  drip -improved today.    Goals of Care -DNR confirmed by daughter--she would not want vasopressors, ICU, central line  DVT prophylaxis: Heparin drip Code Status: DNR Family Communication: ongoing palliative discussions Disposition Plan:  Status is: Inpatient  Remains inpatient appropriate because:Altered mental status, IV treatments appropriate due to intensity of illness or inability to take PO and Inpatient level of care appropriate due to severity of illness.  Ongoing palliative discussions.    Dispo: The patient is from: SNF              Anticipated d/c is to: SNF              Anticipated d/c date is: 1-2 days              Patient currently is not medically stable to d/c.  Consultants:   Palliative care  Procedures:   See below  Antimicrobials:  Anti-infectives (From admission, onward)   Start     Dose/Rate Route Frequency Ordered Stop   05/24/20 1200  vancomycin (VANCOREADY) IVPB 750 mg/150 mL        750 mg 150 mL/hr over 60 Minutes Intravenous Every 48 hours 05/22/20 1359     05/23/20 1300  ceFEPIme (MAXIPIME) 2 g in sodium chloride 0.9 % 100 mL IVPB        2 g 200 mL/hr over 30 Minutes Intravenous Every 24 hours 05/22/20 1356     05/23/20 1000  remdesivir 100 mg in sodium chloride 0.9 % 100 mL IVPB        100 mg 200 mL/hr over 30 Minutes Intravenous Daily 05/22/20 1428 05/27/20 0959   05/23/20 1000  remdesivir 100 mg in sodium chloride 0.9 % 100 mL IVPB  Status:  Discontinued       "Followed by" Linked Group Details   100 mg 200 mL/hr over 30 Minutes Intravenous Daily 05/22/20 2156 05/22/20 2204   05/23/20 0200  ceFEPIme (MAXIPIME) 2 g in sodium chloride 0.9 % 100 mL IVPB  Status:  Discontinued        2 g 200 mL/hr over 30 Minutes Intravenous Every 12 hours 05/22/20 1312 05/22/20 1356   05/22/20 2156  remdesivir 200 mg in sodium chloride 0.9% 250 mL IVPB  Status:  Discontinued       "Followed by" Linked Group Details   200 mg 580 mL/hr over 30 Minutes  Intravenous Once 05/22/20 2156 05/22/20 2204   05/22/20 1500  remdesivir 100 mg in sodium chloride 0.9 % 100 mL IVPB        100 mg 200 mL/hr over 30 Minutes Intravenous Every 1 hr x 2 05/22/20 1428 05/22/20 2231   05/22/20 1230  vancomycin (VANCOREADY) IVPB 1250 mg/250 mL        1,250 mg 166.7 mL/hr over 90 Minutes Intravenous  Once 05/22/20 1221 05/22/20 1456   05/22/20 1215  ceFEPIme (MAXIPIME) 2 g in sodium chloride 0.9 % 100 mL IVPB        2 g 200 mL/hr over 30 Minutes Intravenous  Once 05/22/20 1212 05/22/20 1431   05/22/20 1215  metroNIDAZOLE (FLAGYL) IVPB 500 mg        500 mg 100 mL/hr over 60 Minutes Intravenous  Once 05/22/20 1212 05/22/20 1624   05/22/20 1215  vancomycin (VANCOCIN) IVPB 1000 mg/200 mL premix  Status:  Discontinued        1,000 mg 200 mL/hr over 60 Minutes Intravenous  Once 05/22/20 1212 05/22/20 1221  Subjective: Patient somnolent but arousable, with dementia, no specific complaints.    Objective: Vitals:   05/24/20 0018 05/24/20 0608 05/24/20 1149 05/24/20 1439  BP: 117/69 128/78 134/66 137/72  Pulse: (!) 51 60 (!) 42 (!) 48  Resp: 18 18 16 17   Temp: 97.9 F (36.6 C) 98.7 F (37.1 C)  (!) 97.3 F (36.3 C)  TempSrc: Oral     SpO2:  (!) 89% 100% 100%  Weight:      Height:        Intake/Output Summary (Last 24 hours) at 05/24/2020 1545 Last data filed at 05/24/2020 0400 Gross per 24 hour  Intake 1216.45 ml  Output --  Net 1216.45 ml   Filed Weights   05/22/20 1300  Weight: 54.4 kg   Examination:  General exam: somnolent but arousable.  No apparent distress.  Respiratory system:  Respiratory effort normal.  on room air. Cardiovascular system: normal S1 & S2 heard.   Gastrointestinal system: Abdomen is nondistended, soft and nontender.  Central nervous system: Somnolent but arousable Extremities: No edema Skin: No rashes, lesions or ulcers Psychiatry: flat affect  Data Reviewed: I have personally reviewed following labs and imaging  studies  CBC: Recent Labs  Lab 05/22/20 1316 05/23/20 0615 05/24/20 1132  WBC 7.4 5.7 12.5*  NEUTROABS 5.0 4.6 10.7*  HGB 16.2 12.9* 13.4  HCT 51.1 39.2 41.7  MCV 92.9 91.6 93.7  PLT 169 109* 137*   Basic Metabolic Panel: Recent Labs  Lab 05/22/20 1316 05/23/20 0615 05/24/20 1132  NA 146* 138 146*  K 3.6 5.6* 4.2  CL 107 108 109  CO2 25 21* 20*  GLUCOSE 128* 144* 113*  BUN 16 16 20   CREATININE 1.90* 1.45* 1.48*  CALCIUM 9.8 8.0* 8.9   GFR: Estimated Creatinine Clearance: 27.6 mL/min (A) (by C-G formula based on SCr of 1.48 mg/dL (H)). Liver Function Tests: Recent Labs  Lab 05/22/20 1316 05/23/20 0615 05/24/20 1132  AST 26 25 23   ALT 14 13 12   ALKPHOS 91 62 65  BILITOT 0.6 0.4 0.7  PROT 8.0 5.9* 6.4*  ALBUMIN 3.7 2.6* 2.9*   No results for input(s): LIPASE, AMYLASE in the last 168 hours. No results for input(s): AMMONIA in the last 168 hours. Coagulation Profile: Recent Labs  Lab 05/22/20 1316  INR 1.0   Cardiac Enzymes: No results for input(s): CKTOTAL, CKMB, CKMBINDEX, TROPONINI in the last 168 hours. BNP (last 3 results) No results for input(s): PROBNP in the last 8760 hours. HbA1C: No results for input(s): HGBA1C in the last 72 hours. CBG: No results for input(s): GLUCAP in the last 168 hours. Lipid Profile: No results for input(s): CHOL, HDL, LDLCALC, TRIG, CHOLHDL, LDLDIRECT in the last 72 hours. Thyroid Function Tests: No results for input(s): TSH, T4TOTAL, FREET4, T3FREE, THYROIDAB in the last 72 hours. Anemia Panel: No results for input(s): VITAMINB12, FOLATE, FERRITIN, TIBC, IRON, RETICCTPCT in the last 72 hours. Sepsis Labs: Recent Labs  Lab 05/22/20 1316 05/22/20 1525 05/23/20 0615 05/24/20 1132  PROCALCITON <0.10  --  <0.10 <0.10  LATICACIDVEN 5.9* 4.6*  --   --     Recent Results (from the past 240 hour(s))  Urine culture     Status: None   Collection Time: 05/22/20 12:11 PM   Specimen: In/Out Cath Urine  Result Value Ref  Range Status   Specimen Description   Final    IN/OUT CATH URINE Performed at [facility], [address] [place], 05/24/20 [facility]    Special  Requests   Final    NONE Performed at [facility], [address] [place], [place] [address]    Culture   Final    NO GROWTH Performed at [facility], [address] [place], [place] [address]    Report Status 05/23/2020 FINAL  Final  SARS Coronavirus 2 by RT PCR (hospital order, performed in [facility] hospital lab) Nasopharyngeal Nasopharyngeal Swab     Status: Abnormal   Collection Time: 05/22/20 12:14 PM   Specimen: Nasopharyngeal Swab  Result Value Ref Range Status   SARS Coronavirus 2 POSITIVE (A) NEGATIVE Final    Comment: CRITICAL RESULT CALLED TO, READ BACK BY AND VERIFIED WITH: CRADWELL,L AT 1416 ON 8.30.21 BY ISLEY,B (NOTE) SARS-CoV-2 target nucleic acids are DETECTED  SARS-CoV-2 RNA is generally detectable in upper respiratory specimens  during the acute phase of infection.  Positive results are indicative  of the presence of the identified virus, but do not rule out bacterial infection or co-infection with other pathogens not detected by the test.  Clinical correlation with patient history and  other diagnostic information is necessary to determine patient infection status.  The expected result is negative.  Fact Sheet for Patients:   BoilerBrush.com.cyhttps://www.fda.gov/media/136312/download   Fact Sheet for Healthcare Providers:   https://pope.com/https://www.fda.gov/media/136313/download    This test is not yet approved or cleared by the [place] FDA and  has been authorized for detection and/or diagnosis of SARS-CoV-2 by FDA under an Emergency Use Authorization (EUA).  This EUA will remain in effect ( meaning this test can be used) for the duration of  the COVID-19 declaration under Section 564(b)(1) of the Act, 21 U.S.C. section 360-bbb-3(b)(1), unless the authorization is terminated or revoked sooner.  Performed at [facility], [address] [place], [place] [address]   Blood Culture (routine x 2)     Status: None (Preliminary result)   Collection Time: 05/22/20  1:17 PM   Specimen: BLOOD  Result Value Ref Range Status   Specimen Description BLOOD RIGHT ANTECUBITAL  Final   Special Requests   Final    BOTTLES DRAWN AEROBIC AND ANAEROBIC Blood Culture results may not be optimal due to an inadequate volume of blood received in culture bottles   Culture   Final    NO GROWTH 2 DAYS Performed at [facility], [address] [place], [place] [address]    Report Status PENDING  Incomplete  Blood Culture (routine x 2)     Status: None (Preliminary result)   Collection Time: 05/22/20  1:18 PM   Specimen: BLOOD  Result Value Ref Range Status   Specimen Description BLOOD LEFT ANTECUBITAL  Final   Special Requests   Final    BOTTLES DRAWN AEROBIC AND ANAEROBIC Blood Culture results may not be optimal due to an inadequate volume of blood received in culture bottles   Culture   Final    NO GROWTH 2 DAYS Performed at [facility], [address] [place], [place] [address]    Report Status PENDING  Incomplete    Radiology Studies: CT Head Wo Contrast  Result Date: 05/22/2020 CLINICAL DATA:  86 year old male with altered mental status. EXAM: CT HEAD WITHOUT CONTRAST TECHNIQUE: Contiguous axial images were obtained from the base of the skull through the vertex without intravenous contrast. COMPARISON:  02/12/2020 and prior exams FINDINGS: Brain: No evidence of acute infarction, hemorrhage, hydrocephalus, extra-axial collection or mass lesion/mass effect. Atrophy and chronic small-vessel white matter ischemic changes again noted. Vascular: Carotid  atherosclerotic calcifications are noted. Skull: Normal. Negative for fracture or focal lesion. Sinuses/Orbits: No acute finding. Other: None. IMPRESSION: 1. No evidence of acute intracranial abnormality. 2. Atrophy and chronic small-vessel white matter ischemic changes.  Electronically Signed   By: Harmon Pier M.D.   On: 05/22/2020 16:28   Scheduled Meds: . [START ON 05/25/2020] levothyroxine  25 mcg Intravenous Daily  . memantine  5 mg Oral BID  . methylPREDNISolone (SOLU-MEDROL) injection  0.5 mg/kg Intravenous Q12H   Continuous Infusions: . ceFEPime (MAXIPIME) IV Stopped (05/23/20 1246)  . heparin 650 Units/hr (05/24/20 0252)  . lactated ringers 75 mL/hr at 05/23/20 1615  . remdesivir 100 mg in NS 100 mL 100 mg (05/24/20 1144)  . vancomycin 750 mg (05/24/20 1447)     LOS: 2 days   Time spent: 32 minutes  Azriel Dancy Laural Benes, MD How to contact the [facility] Attending or Consulting provider 7A - 7P or covering provider during after hours 7P -7A, for this patient?  1. Check the care team in [facility] and look for a) attending/consulting TR[facility] provider listed and b) the [facility] team listed 2. Log into [email or web address] and use [facility] universal password to access. If you do not have the password, please contact the hospital operator. 3. Locate the [facility] provider you are looking for under [facility] and page to a number that you can be directly reached. 4. If you still have difficulty reaching the provider, please page the [facility] (Director on Call) for the Hospitalists listed on amion for assistance.  [facility]  If 7PM-7AM, please contact night-coverage [email or web address] 05/24/2020, 3:45 PM

## 2020-05-24 NOTE — Progress Notes (Signed)
ANTICOAGULATION CONSULT NOTE -  Pharmacy Consult for heparin Indication: hx of VTE  Allergies  Allergen Reactions  . Penicillins Other (See Comments)    Unknown reaction    Patient Measurements: Height: 5\' 4"  (162.6 cm) Weight: 54.4 kg (120 lb) IBW/kg (Calculated) : 59.2 Heparin Dosing Weight: 54 kg  Vital Signs: Temp: 98.7 F (37.1 C) (09/01 0608) BP: 134/66 (09/01 1149) Pulse Rate: 42 (09/01 1149)  Labs: Recent Labs    05/22/20 1316 05/22/20 1316 05/23/20 0615 05/23/20 1500 05/24/20 1132 05/24/20 1142  HGB 16.2   < > 12.9*  --  13.4  --   HCT 51.1  --  39.2  --  41.7  --   PLT 169  --  109*  --  137*  --   APTT 26  --   --   --   --   --   LABPROT 13.0  --   --   --   --   --   INR 1.0  --   --   --   --   --   HEPARINUNFRC <0.10*   < > >1.10* 0.38  --  0.37  CREATININE 1.90*  --  1.45*  --  1.48*  --    < > = values in this interval not displayed.    Estimated Creatinine Clearance: 27.6 mL/min (A) (by C-G formula based on SCr of 1.48 mg/dL (H)).   Medical History: Past Medical History:  Diagnosis Date  . Alzheimer disease (HCC)   . Calculus of bile duct (any) with acute cholecystitis   . Cognitive communication deficit   . COVID-19   . Dementia (HCC)   . DVT (deep venous thrombosis) (HCC)   . Dysphagia   . Malnourished (HCC)   . Metabolic encephalopathy   . Protein calorie malnutrition (HCC)   . Renal disorder   . Syncope and collapse   . Thyroid disease     Medications:  Medications Prior to Admission  Medication Sig Dispense Refill Last Dose  . apixaban (ELIQUIS) 2.5 MG TABS tablet Take 1 tablet (2.5 mg total) by mouth 2 (two) times daily. 60 tablet 0 05/22/2020 at 900a  . levothyroxine (SYNTHROID) 50 MCG tablet Take 1 tablet (50 mcg total) by mouth daily at 6 (six) AM.   05/22/2020 at Unknown time  . memantine (NAMENDA) 10 MG tablet Take 10 mg by mouth 2 (two) times daily.   05/22/2020 at 900a  . polyethylene glycol (MIRALAX / GLYCOLAX) 17 g  packet Take 17 g by mouth daily.   05/22/2020 at Unknown time  . senna (SENOKOT) 8.6 MG tablet Take 1 tablet by mouth daily.   05/22/2020 at Unknown time    Assessment: Pharmacy consulted to dose heparin in patient with atrial fibrillation.  Patient is on Eliquis prior to admission with last dose given currently unknown as patient is not responding.   Baseline heparin level <0.10- Will not have to monitor based on aPTT Heparin level this AM is therapeutic at 0.37    Goal of Therapy:  Heparin level 0.3-0.7 IU/ml Monitor platelets by anticoagulation protocol: Yes   Plan:  Continue heparin infusion at 650 units/hr Check daily heparin level while on heparin Continue to monitor H&H and platelets  05/24/2020, BS Robert Hurst, BCPS Clinical Pharmacist Pager [phone] 05/24/2020 12:21 PM

## 2020-05-24 NOTE — Progress Notes (Signed)
ANTICOAGULATION CONSULT NOTE -  Pharmacy Consult for heparin-->apixaban Indication: hx of VTE  Allergies  Allergen Reactions  . Penicillins Other (See Comments)    Unknown reaction    Patient Measurements: Height: 5\' 4"  (162.6 cm) Weight: 54.4 kg (120 lb) IBW/kg (Calculated) : 59.2 Heparin Dosing Weight: 54 kg  Vital Signs: Temp: 97.3 F (36.3 C) (09/01 1439) BP: 137/72 (09/01 1439) Pulse Rate: 48 (09/01 1439)  Labs: Recent Labs    05/22/20 1316 05/22/20 1316 05/23/20 0615 05/23/20 1500 05/24/20 1132 05/24/20 1142  HGB 16.2   < > 12.9*  --  13.4  --   HCT 51.1  --  39.2  --  41.7  --   PLT 169  --  109*  --  137*  --   APTT 26  --   --   --   --   --   LABPROT 13.0  --   --   --   --   --   INR 1.0  --   --   --   --   --   HEPARINUNFRC <0.10*   < > >1.10* 0.38  --  0.37  CREATININE 1.90*  --  1.45*  --  1.48*  --    < > = values in this interval not displayed.    Estimated Creatinine Clearance: 27.6 mL/min (A) (by C-G formula based on SCr of 1.48 mg/dL (H)).   Medical History: Past Medical History:  Diagnosis Date  . Alzheimer disease (HCC)   . Calculus of bile duct (any) with acute cholecystitis   . Cognitive communication deficit   . COVID-19   . Dementia (HCC)   . DVT (deep venous thrombosis) (HCC)   . Dysphagia   . Malnourished (HCC)   . Metabolic encephalopathy   . Protein calorie malnutrition (HCC)   . Renal disorder   . Syncope and collapse   . Thyroid disease     Medications:  Medications Prior to Admission  Medication Sig Dispense Refill Last Dose  . apixaban (ELIQUIS) 2.5 MG TABS tablet Take 1 tablet (2.5 mg total) by mouth 2 (two) times daily. 60 tablet 0 05/22/2020 at 900a  . levothyroxine (SYNTHROID) 50 MCG tablet Take 1 tablet (50 mcg total) by mouth daily at 6 (six) AM.   05/22/2020 at Unknown time  . memantine (NAMENDA) 10 MG tablet Take 10 mg by mouth 2 (two) times daily.   05/22/2020 at 900a  . polyethylene glycol (MIRALAX /  GLYCOLAX) 17 g packet Take 17 g by mouth daily.   05/22/2020 at Unknown time  . senna (SENOKOT) 8.6 MG tablet Take 1 tablet by mouth daily.   05/22/2020 at Unknown time    Assessment: Pharmacy consulted to dose heparin in patient with atrial fibrillation/VTE previously.  Patient is on Eliquis prior to admission with last dose given currently unknown as patient is not responding.   Patient taking PO's now, transition back to eliquis  Goal of Therapy:  Heparin level 0.3-0.7 IU/ml Monitor platelets by anticoagulation protocol: Yes   Plan:  D/C heparin Restart Eliquis 2.5mg  po BID Continue to monitor H&H and platelets  05/24/2020, BS Elder Cyphers, BCPS Clinical Pharmacist Pager [phone] 05/24/2020 4:06 PM

## 2020-05-24 NOTE — TOC Initial Note (Signed)
Transition of Care [facility]) - Initial/Assessment Note    Patient Details  Name: Robert Hurst MRN: [id number] Date of Birth: 09-Feb-1932  Transition of Care [facility]) CM/SW Contact:    Annice Needy, LCSW Phone Number: 05/24/2020, 1:36 PM  Clinical Narrative:                 Patient known to [facility] due to recent admission.Pt from [facility] admitted for severe sepsis. Pt has alzheimer's dementia and is disoriented x4.  Patientt has been resident at [facility] for 4 years   Expected Discharge Plan: Skilled Nursing Facility Barriers to Discharge: Continued Medical Work up   Patient Goals and CMS Choice Patient states their goals for this hospitalization and ongoing recovery are:: return to facility      Expected Discharge Plan and Services Expected Discharge Plan: Skilled Nursing Facility       Living arrangements for the past 2 months: Single Family Home                                      Prior Living Arrangements/Services Living arrangements for the past 2 months: Single Family Home Lives with:: Facility Resident Patient language and need for interpreter reviewed:: Yes        Need for Family Participation in Patient Care: Yes (Comment) Care giver support system in place?: Yes (comment)   Criminal Activity/Legal Involvement Pertinent to Current Situation/Hospitalization: No - Comment as needed  Activities of Daily Living Home Assistive Devices/Equipment: Dan Humphreys (specify type) ADL Screening (condition at time of admission) Patient's cognitive ability adequate to safely complete daily activities?: Yes Is the patient deaf or have difficulty hearing?: No Does the patient have difficulty seeing, even when wearing glasses/contacts?: No Does the patient have difficulty concentrating, remembering, or making decisions?: Yes Patient able to express need for assistance with ADLs?: Yes Does the patient have difficulty dressing or bathing?: Yes Independently performs ADLs?: No Does  the patient have difficulty walking or climbing stairs?: Yes Weakness of Legs: Right Weakness of Arms/Hands: None  Permission Sought/Granted                  Emotional Assessment Appearance:: Appears younger than stated age   Affect (typically observed): Unable to Assess Orientation: : Oriented to Self Alcohol / Substance Use: Not Applicable Psych Involvement: No (comment)  Admission diagnosis:  Somnolence [R40.0] Sepsis due to undetermined organism (HCC) [A41.9] Hypotension, unspecified hypotension type [I95.9] COVID-19 [U07.1] Patient Active Problem List   Diagnosis Date Noted   COVID-19    Advanced care planning/counseling discussion    Palliative care by specialist    Sepsis due to undetermined organism (HCC) 05/22/2020   Hypotension due to hypovolemia    Thrombocytopenia (HCC) 05/03/2020   Sepsis due to Enterococcus (HCC) 05/03/2020   Acute on chronic renal insufficiency    UTI (urinary tract infection) 04/30/2020   CKD (chronic kidney disease), stage III 02/12/2020   Hypothyroidism 02/12/2020   History of DVT (deep vein thrombosis)    History of COVID-19    Acute cystitis without hematuria    Goals of care, counseling/discussion    Palliative care encounter    Acute metabolic encephalopathy 08/11/2019   Syncope and collapse 08/11/2019   Syncope    Dementia without behavioral disturbance (HCC) 08/08/2019   Acute renal failure superimposed on stage 3b chronic kidney disease (HCC) 08/08/2019   Lactic acidosis 08/08/2019   Incontinence 08/08/2019  PCP:  [name], MD Pharmacy:   [address] [place], [place] - [phone] [address] [place] [address] Phone: [phone] Fax: [phone]     Social Determinants of Health (SDOH) Interventions    Readmission Risk Interventions No flowsheet data found.

## 2020-05-24 NOTE — NC FL2 (Signed)
[place] MEDICAID FL2 LEVEL OF CARE SCREENING TOOL     IDENTIFICATION  Patient Name: Robert Hurst Birthdate: 12-11-1931 Sex: male Admission Date (Current Location): 05/22/2020  [place] and [place] Number:  [name] [id number] S Facility and Address:  [facility],  [address] [place] [address]      Provider Number: [id number]  Attending Physician Name and Address:  Cleora Fleet, MD  Relative Name and Phone Number:  CHANAN, DETWILER (Daughter) [phone] [facility] Phone)    Current Level of Care: Hospital Recommended Level of Care: Skilled Nursing Facility Prior Approval Number:    Date Approved/Denied:   PASRR Number:    Discharge Plan: SNF    Current Diagnoses: Patient Active Problem List   Diagnosis Date Noted  . COVID-19   . Advanced care planning/counseling discussion   . Palliative care by specialist   . Sepsis due to undetermined organism (HCC) 05/22/2020  . Hypotension due to hypovolemia   . Thrombocytopenia (HCC) 05/03/2020  . Sepsis due to Enterococcus (HCC) 05/03/2020  . Acute on chronic renal insufficiency   . UTI (urinary tract infection) 04/30/2020  . CKD (chronic kidney disease), stage III 02/12/2020  . Hypothyroidism 02/12/2020  . History of DVT (deep vein thrombosis)   . History of COVID-19   . Acute cystitis without hematuria   . Goals of care, counseling/discussion   . Palliative care encounter   . Acute metabolic encephalopathy 08/11/2019  . Syncope and collapse 08/11/2019  . Syncope   . Dementia without behavioral disturbance (HCC) 08/08/2019  . Acute renal failure superimposed on stage 3b chronic kidney disease (HCC) 08/08/2019  . Lactic acidosis 08/08/2019  . Incontinence 08/08/2019    Orientation RESPIRATION BLADDER Height & Weight        O2 (2L) Incontinent Weight: 120 lb (54.4 kg) Height:  5\' 4"  (162.6 cm)  BEHAVIORAL SYMPTOMS/MOOD NEUROLOGICAL BOWEL NUTRITION STATUS      Incontinent Diet (see discharge  summary)  AMBULATORY STATUS COMMUNICATION OF NEEDS Skin   Extensive Assist Verbally Normal                       Personal Care Assistance Level of Assistance  Bathing, Feeding, Dressing Bathing Assistance: Maximum assistance Feeding assistance: Maximum assistance Dressing Assistance: Maximum assistance     Functional Limitations Info  Sight, Hearing, Speech Sight Info: Adequate Hearing Info: Adequate Speech Info: Adequate    SPECIAL CARE FACTORS FREQUENCY                       Contractures Contractures Info: Not present    Additional Factors Info  Code Status, Allergies, Isolation Precautions Code Status Info: DNR Allergies Info: Penicillins     Isolation Precautions Info: 05/22/20 COVID+     Current Medications (05/24/2020):  This is the current hospital active medication list Current Facility-Administered Medications  Medication Dose Route Frequency Provider Last Rate Last Admin  . acetaminophen (TYLENOL) tablet 650 mg  650 mg Oral Q6H PRN Tat, David, MD      . ceFEPIme (MAXIPIME) 2 g in sodium chloride 0.9 % 100 mL IVPB  2 g Intravenous Q24H Tat, 07/24/2020, MD   Stopped at 05/23/20 1246  . heparin ADULT infusion 100 units/mL (25000 units/211mL sodium chloride 0.45%)  650 Units/hr Intravenous Continuous Shah, Pratik D, DO 6.5 mL/hr at 05/24/20 0252 650 Units/hr at 05/24/20 0252  . lactated ringers infusion   Intravenous Continuous 07/24/20 D, DO 75 mL/hr at 05/23/20 1615 New Bag  at 05/23/20 1615  . [START ON 05/25/2020] levothyroxine (SYNTHROID, LEVOTHROID) injection 25 mcg  25 mcg Intravenous Daily Tat, David, MD      . memantine [facility]) tablet 5 mg  5 mg Oral BID Johnson, Clanford L, MD      . methylPREDNISolone sodium succinate (SOLU-MEDROL) 40 mg/mL injection 27.2 mg  0.5 mg/kg Intravenous Pablo Ledger, MD   27.2 mg at 05/24/20 1129  . ondansetron (ZOFRAN) tablet 4 mg  4 mg Oral Q6H PRN Tat, David, MD       Or  . ondansetron (ZOFRAN) injection 4 mg  4 mg  Intravenous Q6H PRN Tat, David, MD      . remdesivir 100 mg in sodium chloride 0.9 % 100 mL IVPB  100 mg Intravenous Daily Long, Arlyss Repress, MD 200 mL/hr at 05/24/20 1144 100 mg at 05/24/20 1144  . vancomycin (VANCOREADY) IVPB 750 mg/150 mL  750 mg Intravenous Q48H Tat, Onalee Hua, MD         Discharge Medications: Please see discharge summary for a list of discharge medications.  Relevant Imaging Results:  Relevant Lab Results:   Additional Information COVID+ 05/22/20  Sidharth Leverette, Juleen China, LCSW

## 2020-05-25 LAB — CBC WITH DIFFERENTIAL/PLATELET
Abs Immature Granulocytes: 0.07 10*3/uL (ref 0.00–0.07)
Basophils Absolute: 0 10*3/uL (ref 0.0–0.1)
Basophils Relative: 0 %
Eosinophils Absolute: 0 10*3/uL (ref 0.0–0.5)
Eosinophils Relative: 0 %
HCT: 41.2 % (ref 39.0–52.0)
Hemoglobin: 13.7 g/dL (ref 13.0–17.0)
Immature Granulocytes: 1 %
Lymphocytes Relative: 19 %
Lymphs Abs: 1.9 10*3/uL (ref 0.7–4.0)
MCH: 30.4 pg (ref 26.0–34.0)
MCHC: 33.3 g/dL (ref 30.0–36.0)
MCV: 91.6 fL (ref 80.0–100.0)
Monocytes Absolute: 0.8 10*3/uL (ref 0.1–1.0)
Monocytes Relative: 8 %
Neutro Abs: 7.4 10*3/uL (ref 1.7–7.7)
Neutrophils Relative %: 72 %
Platelets: 125 10*3/uL — ABNORMAL LOW (ref 150–400)
RBC: 4.5 MIL/uL (ref 4.22–5.81)
RDW: 15.2 % (ref 11.5–15.5)
WBC: 10.2 10*3/uL (ref 4.0–10.5)
nRBC: 0 % (ref 0.0–0.2)

## 2020-05-25 LAB — COMPREHENSIVE METABOLIC PANEL
ALT: 12 U/L (ref 0–44)
AST: 20 U/L (ref 15–41)
Albumin: 2.9 g/dL — ABNORMAL LOW (ref 3.5–5.0)
Alkaline Phosphatase: 62 U/L (ref 38–126)
Anion gap: 12 (ref 5–15)
BUN: 22 mg/dL (ref 8–23)
CO2: 24 mmol/L (ref 22–32)
Calcium: 8.7 mg/dL — ABNORMAL LOW (ref 8.9–10.3)
Chloride: 112 mmol/L — ABNORMAL HIGH (ref 98–111)
Creatinine, Ser: 1.57 mg/dL — ABNORMAL HIGH (ref 0.61–1.24)
GFR calc Af Amer: 46 mL/min — ABNORMAL LOW (ref >60)
GFR calc non Af Amer: 39 mL/min — ABNORMAL LOW (ref >60)
Glucose, Bld: 102 mg/dL — ABNORMAL HIGH (ref 70–99)
Potassium: 3.9 mmol/L (ref 3.5–5.1)
Sodium: 148 mmol/L — ABNORMAL HIGH (ref 135–145)
Total Bilirubin: 0.6 mg/dL (ref 0.3–1.2)
Total Protein: 6.2 g/dL — ABNORMAL LOW (ref 6.5–8.1)

## 2020-05-25 LAB — D-DIMER, QUANTITATIVE: D-Dimer, Quant: 3.79 ug/mL-FEU — ABNORMAL HIGH (ref 0.00–0.50)

## 2020-05-25 LAB — C-REACTIVE PROTEIN: CRP: 0.9 mg/dL (ref <1.0)

## 2020-05-25 MED ORDER — SODIUM CHLORIDE 0.45 % IV SOLN: INTRAVENOUS | Status: DC

## 2020-05-25 MED ORDER — SENNA 8.6 MG PO TABS
1.0000 | ORAL_TABLET | Freq: Every day | ORAL | Status: DC
  Administered 2020-05-25 – 2020-05-26 (×2): 8.6 mg via ORAL
  Filled 2020-05-25: qty 1

## 2020-05-25 NOTE — Progress Notes (Addendum)
PROGRESS NOTE    Robert Hurst  IOM:[id number] DOB: 1932-08-04 DOA: 05/22/2020 PCP: [name], MD   Brief Narrative:  Per HPI: Robert Hurst is a 86 y.o. male with medical history of Alzheimer's dementia, history of stroke COVID-19, history of DVT on Eliquis, hypothyroidism, patient is demented, nonverbal, unable to provide any history, history was obtained from ED staff and medical records, patient was sent by facility given he was lethargic, cold and clammy and nonverbal.  At baseline, the patient is able to say a few words.  Nevertheless, the patient had recent admission to the hospital from 04/29/2020 to 05/04/2020 secondary to a similar presentation when he was noted to be hypotensive, lethargic, and clammy with a rectal temperature of 96 F.  He was treated for sepsis secondary to Enterococcus faecalis UTI and subsequently sent back to his nursing facility. In the emergency department, the patient was noted to be hypothermic with a temperature of 96 point degrees Fahrenheit.  He was initially hypotensive with a systolic blood pressure in the 70s.  Oxygen saturation varied from 90 to 94% on room air.  The patient was noted to be COVID-19 positive.  He was started on remdesivir and steroids.  In addition, UA showed 11-20 WBC.  Lactic acid was 5.9.  Chest x-ray showed bibasilar opacities.  The patient was started on vancomycin, cefepime, and metronidazole.  BP responded well to 2L LR improving to low 100s.  8/31: Patient readmitted for severe sepsis in the setting of a urinary source versus possible pneumonia.  Procalcitonin noted to be low this morning and D-dimer elevated at 11.34.  Plan to still continue antibiotics as ordered.  Lactic acid noted to be 4.6 yesterday, plan to continue to follow and repeat.  Patient will need palliative consultation which is still pending, he seems to be an appropriate candidate for hospice.  9/1:  Pt able to eat again.  Palliative care discussions in progress  regarding goals of care.    9/2: Pt eating with assistance.  Pt to complete final dose of remdesivir 9/3 and likely could return to SNF environment.   Assessment & Plan:   Active Problems:   Acute renal failure superimposed on stage 3b chronic kidney disease (HCC)   Lactic acidosis   Acute metabolic encephalopathy   Hypothyroidism   UTI (urinary tract infection)   Thrombocytopenia (HCC)   Sepsis due to undetermined organism (HCC)  Severe sepsis secondary to Covid 19 virus - RESOLVED -Presented with hypothermia, hypotension, elevated lactic acid -Likely secondary to viral pneumonia -Procalcitonin<0.10 -Lactic acid peaked at 5.9>>4.6 -Follow inflammatory markers for COVID-19 -Following cultures which are no growth to date -Empiric vancomycin and cefepime was started pending culture data, with negative cultures, DC'd 9/1 -Treated supportively with IV fluids.  -Palliative consultation appreciated, goals of care discussions in progress  COVID-19 pneumonia -Personally reviewed chest x-ray--bibasilar opacities -Oxygen saturation varying from 90-94% -Continue remdesivir and steroids -The patient has listed in his past medical history that he has had COVID-19 in the past, but I do not have any records to validate this from his laboratory evaluations -Followed daily inflammatory markers and now trending down.  -He will discharge now to SNF for covid patients.  He has fully completed remdesivir and will need to complete 5 more days of steroids.  He remains on room air and no supplemental oxygen.    Hypernatremia  - add more free water, changed IV fluid to 0.45 NS at 70 cc/hour  Acute on chronic renal failure--CKD  stage IIIa-improving -Secondary to sepsis and dehydration -Baseline creatinine 1.1-1.3, currently 1.48 -Presented with serum creatinine 1.90  Hypothyroidism -resumed home oral levothyroxine  acute metabolic encephalopathy -Secondary tosepsis -improved with  treatments  Dementiawithout behavioral disturbance -Continuenamenda -Constant reorientation and supportive care. -Appreciate evaluation by speech therapy who has recommended dysphagia 2 diet with nectar thick liquids.  hx of DVT -IV heparin discontinued and he has been transitioned back to home apixaban.   Thrombocytopenia-downtrending -chronic dating back to nov 2020 -improved but we have been following.    Chronic Constipation - resumed laxatives.   Goals of Care -DNR confirmed by daughter--she would not want vasopressors, ICU, central line  DVT prophylaxis: apixaban Code Status: DNR Family Communication: ongoing palliative discussions Disposition Plan: likely can return to [facility] 9/3 after remdesivir if bed available Status is: Inpatient  Remains inpatient appropriate because:Altered mental status, IV treatments appropriate due to intensity of illness or inability to take PO and Inpatient level of care appropriate due to severity of illness.  Ongoing palliative discussions.  Likely can return to [facility] 9/3 after final remdesivir if bed available.   Dispo: The patient is from: SNF              Anticipated d/c is to: SNF              Anticipated d/c date is: 1 days              Patient currently is not medically stable to d/c.  Consultants:   Palliative care  Procedures:   See below  Antimicrobials:  Anti-infectives (From admission, onward)   Start     Dose/Rate Route Frequency Ordered Stop   05/24/20 1200  vancomycin (VANCOREADY) IVPB 750 mg/150 mL  Status:  Discontinued        750 mg 150 mL/hr over 60 Minutes Intravenous Every 48 hours 05/22/20 1359 05/24/20 1558   05/23/20 1300  ceFEPIme (MAXIPIME) 2 g in sodium chloride 0.9 % 100 mL IVPB  Status:  Discontinued        2 g 200 mL/hr over 30 Minutes Intravenous Every 24 hours 05/22/20 1356 05/24/20 1558   05/23/20 1000  remdesivir 100 mg in sodium chloride 0.9 % 100 mL IVPB        100 mg 200 mL/hr over 30 Minutes  Intravenous Daily 05/22/20 1428 05/27/20 0959   05/23/20 1000  remdesivir 100 mg in sodium chloride 0.9 % 100 mL IVPB  Status:  Discontinued       "Followed by" Linked Group Details   100 mg 200 mL/hr over 30 Minutes Intravenous Daily 05/22/20 2156 05/22/20 2204   05/23/20 0200  ceFEPIme (MAXIPIME) 2 g in sodium chloride 0.9 % 100 mL IVPB  Status:  Discontinued        2 g 200 mL/hr over 30 Minutes Intravenous Every 12 hours 05/22/20 1312 05/22/20 1356   05/22/20 2156  remdesivir 200 mg in sodium chloride 0.9% 250 mL IVPB  Status:  Discontinued       "Followed by" Linked Group Details   200 mg 580 mL/hr over 30 Minutes Intravenous Once 05/22/20 2156 05/22/20 2204   05/22/20 1500  remdesivir 100 mg in sodium chloride 0.9 % 100 mL IVPB        100 mg 200 mL/hr over 30 Minutes Intravenous Every 1 hr x 2 05/22/20 1428 05/22/20 2231   05/22/20 1230  vancomycin (VANCOREADY) IVPB 1250 mg/250 mL        1,250 mg 166.7 mL/hr over  90 Minutes Intravenous  Once 05/22/20 1221 05/22/20 1456   05/22/20 1215  ceFEPIme (MAXIPIME) 2 g in sodium chloride 0.9 % 100 mL IVPB        2 g 200 mL/hr over 30 Minutes Intravenous  Once 05/22/20 1212 05/22/20 1431   05/22/20 1215  metroNIDAZOLE (FLAGYL) IVPB 500 mg        500 mg 100 mL/hr over 60 Minutes Intravenous  Once 05/22/20 1212 05/22/20 1624   05/22/20 1215  vancomycin (VANCOCIN) IVPB 1000 mg/200 mL premix  Status:  Discontinued        1,000 mg 200 mL/hr over 60 Minutes Intravenous  Once 05/22/20 1212 05/22/20 1221      Subjective: Patient eating breakfast, no specific complaints.  He is mostly nonverbal.    Objective: Vitals:   05/24/20 1149 05/24/20 1439 05/24/20 2118 05/24/20 2149  BP: 134/66 137/72  102/62  Pulse: (!) 42 (!) 48  (!) 59  Resp: 16 17  18   Temp:  (!) 97.3 F (36.3 C)  98.8 F (37.1 C)  TempSrc:      SpO2: 100% 100% 96% 96%  Weight:      Height:        Intake/Output Summary (Last 24 hours) at 05/25/2020 1455 Last data filed at  05/24/2020 1745 Gross per 24 hour  Intake 679.36 ml  Output 1200 ml  Net -520.64 ml   Filed Weights   05/22/20 1300  Weight: 54.4 kg   Examination:  General exam: awake, alert.  No apparent distress.  Respiratory system:  No increased work of breathing seen. Cardiovascular system: normal S1 & S2 heard.   Gastrointestinal system: Abdomen is nondistended, soft and nontender.  Central nervous system: no focal findings.   Extremities: No edema Skin: No rashes, lesions or ulcers Psychiatry: flat affect  Unchanged.   Data Reviewed: I have personally reviewed following labs and imaging studies  CBC: Recent Labs  Lab 05/22/20 1316 05/23/20 0615 05/24/20 1132 05/25/20 0739  WBC 7.4 5.7 12.5* 10.2  NEUTROABS 5.0 4.6 10.7* 7.4  HGB 16.2 12.9* 13.4 13.7  HCT 51.1 39.2 41.7 41.2  MCV 92.9 91.6 93.7 91.6  PLT 169 109* 137* 125*   Basic Metabolic Panel: Recent Labs  Lab 05/22/20 1316 05/23/20 0615 05/24/20 1132 05/25/20 0739  NA 146* 138 146* 148*  K 3.6 5.6* 4.2 3.9  CL 107 108 109 112*  CO2 25 21* 20* 24  GLUCOSE 128* 144* 113* 102*  BUN 16 16 20 22   CREATININE 1.90* 1.45* 1.48* 1.57*  CALCIUM 9.8 8.0* 8.9 8.7*   GFR: Estimated Creatinine Clearance: 26 mL/min (A) (by C-G formula based on SCr of 1.57 mg/dL (H)). Liver Function Tests: Recent Labs  Lab 05/22/20 1316 05/23/20 0615 05/24/20 1132 05/25/20 0739  AST 26 25 23 20   ALT 14 13 12 12   ALKPHOS 91 62 65 62  BILITOT 0.6 0.4 0.7 0.6  PROT 8.0 5.9* 6.4* 6.2*  ALBUMIN 3.7 2.6* 2.9* 2.9*   No results for input(s): LIPASE, AMYLASE in the last 168 hours. No results for input(s): AMMONIA in the last 168 hours. Coagulation Profile: Recent Labs  Lab 05/22/20 1316  INR 1.0   Cardiac Enzymes: No results for input(s): CKTOTAL, CKMB, CKMBINDEX, TROPONINI in the last 168 hours. BNP (last 3 results) No results for input(s): PROBNP in the last 8760 hours. HbA1C: No results for input(s): HGBA1C in the last 72  hours. CBG: No results for input(s): GLUCAP in the last 168 hours. Lipid  Profile: No results for input(s): CHOL, HDL, LDLCALC, TRIG, CHOLHDL, LDLDIRECT in the last 72 hours. Thyroid Function Tests: No results for input(s): TSH, T4TOTAL, FREET4, T3FREE, THYROIDAB in the last 72 hours. Anemia Panel: No results for input(s): VITAMINB12, FOLATE, FERRITIN, TIBC, IRON, RETICCTPCT in the last 72 hours. Sepsis Labs: Recent Labs  Lab 05/22/20 1316 05/22/20 1525 05/23/20 0615 05/24/20 1132  PROCALCITON <0.10  --  <0.10 <0.10  LATICACIDVEN 5.9* 4.6*  --   --     Recent Results (from the past 240 hour(s))  Urine culture     Status: None   Collection Time: 05/22/20 12:11 PM   Specimen: In/Out Cath Urine  Result Value Ref Range Status   Specimen Description   Final    IN/OUT CATH URINE Performed at [facility], [address], [place], [place] [address]    Special Requests   Final    NONE Performed at [facility], [address], [place], [place] [address]    Culture   Final    NO GROWTH Performed at [facility], [address], [place], [place] [address]    Report Status 05/23/2020 FINAL  Final  SARS Coronavirus 2 by RT PCR (hospital order, performed in [facility] hospital lab) Nasopharyngeal Nasopharyngeal Swab     Status: Abnormal   Collection Time: 05/22/20 12:14 PM   Specimen: Nasopharyngeal Swab  Result Value Ref Range Status   SARS Coronavirus 2 POSITIVE (A) NEGATIVE Final    Comment: CRITICAL RESULT CALLED TO, READ BACK BY AND VERIFIED WITH: CRADWELL,L AT 1416 ON 8.30.21 BY ISLEY,B (NOTE) SARS-CoV-2 target nucleic acids are DETECTED  SARS-CoV-2 RNA is generally detectable in upper respiratory specimens  during the acute phase of infection.  Positive results are indicative  of the presence of the identified virus, but do not rule out bacterial infection or co-infection with other pathogens not detected by the test.  Clinical correlation with patient history and   other diagnostic information is necessary to determine patient infection status.  The expected result is negative.  Fact Sheet for Patients:   BoilerBrush.com.cy   Fact Sheet for Healthcare Providers:   https://pope.com/    This test is not yet approved or cleared by the [place] FDA and  has been authorized for detection and/or diagnosis of SARS-CoV-2 by FDA under an Emergency Use Authorization (EUA).  This EUA will remain in effect ( meaning this test can be used) for the duration of  the COVID-19 declaration under Section 564(b)(1) of the Act, 21 U.S.C. section 360-bbb-3(b)(1), unless the authorization is terminated or revoked sooner.  Performed at [facility], [address], [place], [place] [address]   Blood Culture (routine x 2)     Status: None (Preliminary result)   Collection Time: 05/22/20  1:17 PM   Specimen: BLOOD  Result Value Ref Range Status   Specimen Description BLOOD RIGHT ANTECUBITAL  Final   Special Requests   Final    BOTTLES DRAWN AEROBIC AND ANAEROBIC Blood Culture results may not be optimal due to an inadequate volume of blood received in culture bottles   Culture   Final    NO GROWTH 3 DAYS Performed at [facility], [address] [place], [place] [address]    Report Status PENDING  Incomplete  Blood Culture (routine x 2)     Status: None (Preliminary result)   Collection Time: 05/22/20  1:18 PM   Specimen: BLOOD  Result Value Ref Range Status   Specimen Description  BLOOD LEFT ANTECUBITAL  Final   Special Requests   Final    BOTTLES DRAWN AEROBIC AND ANAEROBIC Blood Culture results may not be optimal due to an inadequate volume of blood received in culture bottles   Culture   Final    NO GROWTH 3 DAYS Performed at [facility], [address], [place], [place] [address]    Report Status PENDING  Incomplete    Radiology Studies: No results found. Scheduled Meds: . apixaban  2.5 mg Oral BID   . levothyroxine  50 mcg Oral Q0600  . memantine  5 mg Oral BID  . methylPREDNISolone (SOLU-MEDROL) injection  0.5 mg/kg Intravenous Q12H   Continuous Infusions: . sodium chloride    . remdesivir 100 mg in NS 100 mL 100 mg (05/25/20 1055)     LOS: 3 days   Time spent: 30 minutes  Deondre Marinaro Laural Benes, MD How to contact the [facility] Attending or Consulting provider 7A - 7P or covering provider during after hours 7P -7A, for this patient?  1. Check the care team in [facility] and look for a) attending/consulting TRH provider listed and b) the [facility] team listed 2. Log into [email or web address] and use [place]'s universal password to access. If you do not have the password, please contact the hospital operator. 3. Locate the [facility] provider you are looking for under [facility] and page to a number that you can be directly reached. 4. If you still have difficulty reaching the provider, please page the [facility] (Director on Call) for the Hospitalists listed on amion for assistance.  [facility]  If 7PM-7AM, please contact night-coverage [email or web address] 05/25/2020, 2:55 PM

## 2020-05-25 NOTE — Progress Notes (Signed)
Pt has eaten well today, fed self at both breakfast and lunch. In to set up for supper, pt has pulled condom cath off and is incontinent of urine. Pt bathed, linens and gown changed. Noted that pt's abd distended and firm. Bowel sounds active. Pt has no recorded BM since this admission and pt unable to tell me when his last BM was. MD Laural Benes notified of abd distention and no recorded BM.

## 2020-05-26 LAB — COMPREHENSIVE METABOLIC PANEL
ALT: 9 U/L (ref 0–44)
AST: 15 U/L (ref 15–41)
Albumin: 2.6 g/dL — ABNORMAL LOW (ref 3.5–5.0)
Alkaline Phosphatase: 55 U/L (ref 38–126)
Anion gap: 6 (ref 5–15)
BUN: 16 mg/dL (ref 8–23)
CO2: 25 mmol/L (ref 22–32)
Calcium: 8.2 mg/dL — ABNORMAL LOW (ref 8.9–10.3)
Chloride: 108 mmol/L (ref 98–111)
Creatinine, Ser: 1.26 mg/dL — ABNORMAL HIGH (ref 0.61–1.24)
GFR calc Af Amer: 59 mL/min — ABNORMAL LOW (ref >60)
GFR calc non Af Amer: 51 mL/min — ABNORMAL LOW (ref >60)
Glucose, Bld: 98 mg/dL (ref 70–99)
Potassium: 3.6 mmol/L (ref 3.5–5.1)
Sodium: 139 mmol/L (ref 135–145)
Total Bilirubin: 0.6 mg/dL (ref 0.3–1.2)
Total Protein: 5.3 g/dL — ABNORMAL LOW (ref 6.5–8.1)

## 2020-05-26 LAB — D-DIMER, QUANTITATIVE: D-Dimer, Quant: 3.49 ug/mL-FEU — ABNORMAL HIGH (ref 0.00–0.50)

## 2020-05-26 MED ORDER — FREE WATER: 100.0000 mL | Freq: Four times a day (QID) | Status: DC

## 2020-05-26 MED ORDER — BISACODYL 10 MG RE SUPP
10.0000 mg | Freq: Once | RECTAL | Status: AC
  Administered 2020-05-26: 10 mg via RECTAL
  Filled 2020-05-26: qty 1

## 2020-05-26 MED ORDER — ACETAMINOPHEN 325 MG PO TABS: 650.0000 mg | ORAL_TABLET | Freq: Four times a day (QID) | ORAL | Status: AC | PRN

## 2020-05-26 MED ORDER — DEXAMETHASONE 6 MG PO TABS: 6.0000 mg | ORAL_TABLET | Freq: Every day | ORAL | 0 refills | Status: AC

## 2020-05-26 MED ORDER — DEXAMETHASONE 6 MG PO TABS: 6.0000 mg | ORAL_TABLET | Freq: Every day | ORAL | 0 refills | Status: DC

## 2020-05-26 NOTE — Care Management Important Message (Signed)
Important Message  Patient Details  Name: Robert Hurst MRN: [id number] Date of Birth: April 22, 1932   Medicare Important Message Given:  Yes - Important Message mailed due to current National Emergency     Corey Harold 05/26/2020, 4:22 PM

## 2020-05-26 NOTE — TOC Transition Note (Signed)
Transition of Care [facility]) - CM/SW Discharge Note   Patient Details  Name: Robert Hurst MRN: [id number] Date of Birth: 06-06-1932  Transition of [facility] CM/SW Contact:  Annice Needy, LCSW Phone Number: 05/26/2020, 1:47 PM   Clinical Narrative:    Patient will discharge to [facility] due to being COVID+. Patient's daughter, [name], notified of discharge and facility change.    Final next level of care: Skilled Nursing Facility Barriers to Discharge: No Barriers Identified   Patient Goals and CMS Choice Patient states their goals for this hospitalization and ongoing recovery are:: return to facility      Discharge Placement              Patient chooses bed at: Other - please specify in the comment section below: ([facility]) Patient to be transferred to facility by: [facility] Name of family member notified: Dtr. Vicenta Aly Patient and family notified of of transfer: 05/26/20  Discharge Plan and Services                                     Social Determinants of Health (SDOH) Interventions     Readmission Risk Interventions Readmission Risk Prevention Plan 05/25/2020  Transportation Screening Complete  PCP or Specialist Appt within 3-5 Days Not Complete  Not Complete comments Patient is from [facility]. He is seen by the facility medical director.  Social Work Consult for Recovery Care Planning/Counseling Complete  Palliative Care Screening Complete  Medication Review [facility]) Complete  Some recent data might be hidden

## 2020-05-26 NOTE — Discharge Summary (Signed)
Physician Discharge Summary  Robert Hurst [id number] DOB: 1932/08/03 DOA: 05/22/2020  PCP: [name], MD  Admit date: 05/22/2020 Discharge date: 05/26/2020  [facility] Disposition: the [facility] (Covid)  Recommendations for Outpatient Follow-up:  1. Follow up with PCP in 2 weeks 2. Monitor for bowel movements as he has chronic constipation 3. ASSIST WITH FEEDING 4. PALLIATIVE MEDICINE CONSULTATION RECOMMENDED 5. ENCOURAGE FREE WATER CONSUMPTION QID  Discharge Condition: STABLE   CODE STATUS: DNR     DIET: Dysphagia 2 with nectar thick liquids.  ASSIST WITH FEEDING  Brief Hospitalization Summary: Please see all hospital notes, images, labs for full details of the hospitalization. ADMISSION HPI:  Robert Hurst is a 86 y.o. male with medical history of Alzheimer's dementia, history of stroke COVID-19, history of DVT on Eliquis, hypothyroidism, patient is demented, nonverbal, unable to provide any history, history was obtained from ED staff and medical records, patient was sent by facility given he was lethargic, cold and clammy and nonverbal.  At baseline, the patient is able to say a few words.  Nevertheless, the patient had recent admission to the hospital from 04/29/2020 to 05/04/2020 secondary to a similar presentation when he was noted to be hypotensive, lethargic, and clammy with a rectal temperature of 96 F.  He was treated for sepsis secondary to Enterococcus faecalis UTI and subsequently sent back to his nursing facility.  In the emergency department, the patient was noted to be hypothermic with a temperature of 96 point degrees Fahrenheit.  He was initially hypotensive with a systolic blood pressure in the 70s.  Oxygen saturation varied from 90 to 94% on room air.  The patient was noted to be COVID-19 positive.  He was started on remdesivir and steroids.  In addition, UA showed 11-20 WBC.  Lactic acid was 5.9.  Chest x-ray showed bibasilar opacities.  The patient was  started on vancomycin, cefepime, and metronidazole.  BP responded well to 2L LR improving to low 100s.  HOSPITAL COURSE  8/31: Patient readmitted for severe sepsis in the setting of a urinary source versus possible pneumonia.  Procalcitonin noted to be low this morning and D-dimer elevated at 11.34.  Plan to still continue antibiotics as ordered.  Lactic acid noted to be 4.6 yesterday, plan to continue to follow and repeat.  Patient will need palliative consultation which is still pending, he seems to be an appropriate candidate for hospice.  9/1:  Pt able to eat again.  Palliative care discussions in progress regarding goals of care.    9/2: Pt eating with assistance.  Pt to complete final dose of remdesivir 9/3 and likely could return to SNF environment.   Assessment & Plan:   Severesepsis secondary to Covid 19 virus - RESOLVED -Presented with hypothermia, hypotension, elevated lactic acid -Likely secondary to viral pneumonia -Procalcitonin<0.10 -Lactic acid peaked at 5.9>>4.6 -Follow inflammatory markers for COVID-19 -Following cultures which are no growth to date -Empiric vancomycin and cefepime was started pending culture data, with negative cultures, DC'd 9/1 -Treated supportively with IV fluids.  -Palliative consultation appreciated, goals of care discussions  COVID-19 pneumonia -Personally reviewed chest x-ray--bibasilar opacities -Oxygen saturation varying from 90-94% -Treated with remdesivir and steroids -The patient has listed in his past medical history that he has had COVID-19 in the past, but could not find records to validate this from his laboratory evaluations in [facility] / Epic EMR -Followed daily inflammatory markers and now trending down.  -He is stable to discharge now  to SNF for covid patients.  He has fully completed remdesivir and will need to complete 5 more days of oral steroids.  He remains on room air and no supplemental oxygen.    Hypernatremia  - RESOLVED - added more free water, changed IV fluid to 0.45 NS at 70 cc/hour with good results - ENCOURAGE FREE WATER   Acute on chronic renal failure--CKD stage IIIa-improved -Secondary to sepsis and dehydration -Baseline creatinine 1.1-1.3, currently 1.26 -Presented with serum creatinine 1.90  Hypothyroidism -resumed home oral levothyroxine  acute metabolic encephalopathy - RESOLVED  -Secondary tosepsis -improved with treatments  Dementiawithout behavioral disturbance -Continuenamenda -Constant reorientation and supportive care. -Appreciate evaluation by speech therapy who has recommended dysphagia 2 diet with nectar thick liquids.  HISTORY of DVT / COAGULOPATHY -IV heparin discontinued and he has been transitioned back to home apixaban.   Thrombocytopenia-CHRONIC -chronic dating back to nov 2020 -PLATELET COUNT 125 AT DISCHARGE.  -NO BLEEDING COMPLICATIONS    Chronic Constipation - resumed laxatives.  PLEASE MONITOR FOR REGULAR BOWEL MOVEMENTS  Goals of Care -DNR confirmed by daughter--she would not want vasopressors, ICU, central line  DVT prophylaxis: apixaban Code Status: DNR Family Communication: ongoing palliative discussions Disposition Plan: return to SNF 9/3 Status is: Inpatient  Remains inpatient appropriate because:Altered mental status, IV treatments appropriate due to intensity of illness or inability to take PO and Inpatient level of care appropriate due to severity of illness.  Ongoing palliative discussions.  Likely can return to [facility] 9/3 after final remdesivir if bed available.   Dispo: The patient is from: SNF  Anticipated d/c is to: SNF  Anticipated d/c date is: 1 days  Patient currently is not medically stable to d/c.  Consultants:   Palliative care  Discharge Diagnoses:  Active Problems:   Acute renal failure superimposed on stage 3b chronic kidney disease (HCC)   Lactic acidosis   Acute  metabolic encephalopathy   Hypothyroidism   UTI (urinary tract infection)   Thrombocytopenia (HCC)   Sepsis    COVID PNEUMONIA    Discharge Instructions:  Allergies as of 05/26/2020      Reactions   Penicillins Other (See Comments)   Unknown reaction      Medication List    TAKE these medications   acetaminophen 325 MG tablet Commonly known as: TYLENOL Take 2 tablets (650 mg total) by mouth every 6 (six) hours as needed for mild pain, fever or headache (fever >/= 101).   apixaban 2.5 MG Tabs tablet Commonly known as: ELIQUIS Take 1 tablet (2.5 mg total) by mouth 2 (two) times daily.   dexamethasone 6 MG tablet Commonly known as: DECADRON Take 1 tablet (6 mg total) by mouth daily for 5 days. Start taking on: May 27, 2020   free water Soln Take 100 mLs by mouth in the morning, at noon, in the evening, and at bedtime.   levothyroxine 50 MCG tablet Commonly known as: SYNTHROID Take 1 tablet (50 mcg total) by mouth daily at 6 (six) AM.   memantine 10 MG tablet Commonly known as: NAMENDA Take 10 mg by mouth 2 (two) times daily.   polyethylene glycol 17 g packet Commonly known as: MIRALAX / GLYCOLAX Take 17 g by mouth daily.   senna 8.6 MG tablet Commonly known as: SENOKOT Take 1 tablet by mouth daily.       Follow-up Information    [name], MD. Schedule an appointment as soon as possible for a visit in 2 week(s).   Specialty:  Internal Medicine Contact information: [address] [place] [address] [phone]              Allergies  Allergen Reactions  . Penicillins Other (See Comments)    Unknown reaction   Allergies as of 05/26/2020      Reactions   Penicillins Other (See Comments)   Unknown reaction      Medication List    TAKE these medications   acetaminophen 325 MG tablet Commonly known as: TYLENOL Take 2 tablets (650 mg total) by mouth every 6 (six) hours as needed for mild pain, fever or headache (fever >/= 101).    apixaban 2.5 MG Tabs tablet Commonly known as: ELIQUIS Take 1 tablet (2.5 mg total) by mouth 2 (two) times daily.   dexamethasone 6 MG tablet Commonly known as: DECADRON Take 1 tablet (6 mg total) by mouth daily for 5 days. Start taking on: May 27, 2020   free water Soln Take 100 mLs by mouth in the morning, at noon, in the evening, and at bedtime.   levothyroxine 50 MCG tablet Commonly known as: SYNTHROID Take 1 tablet (50 mcg total) by mouth daily at 6 (six) AM.   memantine 10 MG tablet Commonly known as: NAMENDA Take 10 mg by mouth 2 (two) times daily.   polyethylene glycol 17 g packet Commonly known as: MIRALAX / GLYCOLAX Take 17 g by mouth daily.   senna 8.6 MG tablet Commonly known as: SENOKOT Take 1 tablet by mouth daily.       Procedures/Studies: CT Head Wo Contrast  Result Date: 05/22/2020 CLINICAL DATA:  86 year old male with altered mental status. EXAM: CT HEAD WITHOUT CONTRAST TECHNIQUE: Contiguous axial images were obtained from the base of the skull through the vertex without intravenous contrast. COMPARISON:  02/12/2020 and prior exams FINDINGS: Brain: No evidence of acute infarction, hemorrhage, hydrocephalus, extra-axial collection or mass lesion/mass effect. Atrophy and chronic small-vessel white matter ischemic changes again noted. Vascular: Carotid atherosclerotic calcifications are noted. Skull: Normal. Negative for fracture or focal lesion. Sinuses/Orbits: No acute finding. Other: None. IMPRESSION: 1. No evidence of acute intracranial abnormality. 2. Atrophy and chronic small-vessel white matter ischemic changes. Electronically Signed   By: Harmon Pier M.D.   On: 05/22/2020 16:28   DG Chest Port 1 View  Result Date: 05/22/2020 CLINICAL DATA:  Shortness of breath. EXAM: PORTABLE CHEST 1 VIEW COMPARISON:  April 29, 2020. FINDINGS: Stable cardiomediastinal silhouette. No pneumothorax is noted. Minimal right basilar subsegmental atelectasis is noted.  Mild left basilar atelectasis or infiltrate is noted with probable small left pleural effusion. Bony thorax is unremarkable. IMPRESSION: Minimal right basilar subsegmental atelectasis. Mild left basilar atelectasis or infiltrate is noted with probable small left pleural effusion. Aortic Atherosclerosis (ICD10-I70.0). Electronically Signed   By: Lupita Raider M.D.   On: 05/22/2020 12:28   DG Chest Port 1 View  Result Date: 04/30/2020 CLINICAL DATA:  Sepsis EXAM: PORTABLE CHEST 1 VIEW COMPARISON:  Feb 12, 2020 FINDINGS: The heart size and mediastinal contours are within normal limits. Aortic knob calcifications are seen. Both lungs are clear. The visualized skeletal structures are unremarkable. IMPRESSION: No active disease. Electronically Signed   By: [name] M.D.   On: 04/30/2020 00:05   [place] EKG SITE RITE  Result Date: 04/30/2020 If Site Rite image not attached, placement could not be confirmed due to current cardiac rhythm.  [place] EKG SITE RITE  Result Date: 04/30/2020 If Site Rite image not attached, placement could not  be confirmed due to current cardiac rhythm.    Subjective: Pt without complaints.    Discharge Exam: Vitals:   05/26/20 0532 05/26/20 1343  BP: 121/72 131/86  Pulse: (!) 54 (!) 58  Resp: 18   Temp: 98 F (36.7 C)   SpO2: 100%    Vitals:   05/25/20 1400 05/25/20 2137 05/26/20 0532 05/26/20 1343  BP: 134/67 126/77 121/72 131/86  Pulse: (!) 48 (!) 53 (!) 54 (!) 58  Resp: 16 20 18    Temp: 98.2 F (36.8 C) 98.1 F (36.7 C) 98 F (36.7 C)   TempSrc: Oral     SpO2: 100% 98% 100%   Weight:      Height:       General exam: elderly chronically ill appearing male with dementia, awake, alert.  No apparent distress. Awake & alert.  Respiratory system:  No increased work of breathing seen. Cardiovascular system: normal S1 & S2 heard.   Gastrointestinal system: Abdomen is nondistended, soft and nontender.  Central nervous system: no focal findings.   Extremities: No  edema Skin: No rashes, lesions or ulcers Psychiatry: flat affect  Unchanged.   The results of significant diagnostics from this hospitalization (including imaging, microbiology, ancillary and laboratory) are listed below for reference.     Microbiology: Recent Results (from the past 240 hour(s))  Urine culture     Status: None   Collection Time: 05/22/20 12:11 PM   Specimen: In/Out Cath Urine  Result Value Ref Range Status   Specimen Description   Final    IN/OUT CATH URINE Performed at [facility], [address] [place], [place]    Special Requests   Final    NONE Performed at [facility], [address], [place], [place]    Culture   Final    NO GROWTH Performed at [facility], [address] [place], [place]    Report Status 05/23/2020 FINAL  Final  SARS Coronavirus 2 by RT PCR (hospital order, performed in [facility] hospital lab) Nasopharyngeal Nasopharyngeal Swab     Status: Abnormal   Collection Time: 05/22/20 12:14 PM   Specimen: Nasopharyngeal Swab  Result Value Ref Range Status   SARS Coronavirus 2 POSITIVE (A) NEGATIVE Final    Comment: CRITICAL RESULT CALLED TO, READ BACK BY AND VERIFIED WITH: CRADWELL,L AT 1416 ON 8.30.21 BY ISLEY,B (NOTE) SARS-CoV-2 target nucleic acids are DETECTED  SARS-CoV-2 RNA is generally detectable in upper respiratory specimens  during the acute phase of infection.  Positive results are indicative  of the presence of the identified virus, but do not rule out bacterial infection or co-infection with other pathogens not detected by the test.  Clinical correlation with patient history and  other diagnostic information is necessary to determine patient infection status.  The expected result is negative.  Fact Sheet for Patients:   02-02-1990   Fact Sheet for Healthcare Providers:   BoilerBrush.com.cy    This test is not yet approved or  cleared by the https://pope.com/ FDA and  has been authorized for detection and/or diagnosis of SARS-CoV-2 by FDA under an Emergency Use Authorization (EUA).  This EUA will remain in effect ( meaning this test can be used) for the duration of  the COVID-19 declaration under Section 564(b)(1) of the Act, 21 U.S.C. section 360-bbb-3(b)(1), unless the authorization is terminated or revoked sooner.  Performed at [facility], [address] [place] [address] [place], [place]   Blood Culture (routine x  2)     Status: None (Preliminary result)   Collection Time: 05/22/20  1:17 PM   Specimen: BLOOD  Result Value Ref Range Status   Specimen Description BLOOD RIGHT ANTECUBITAL  Final   Special Requests   Final    BOTTLES DRAWN AEROBIC AND ANAEROBIC Blood Culture results may not be optimal due to an inadequate volume of blood received in culture bottles   Culture   Final    NO GROWTH 4 DAYS Performed at [facility], [address] [place], [place] [address]    Report Status PENDING  Incomplete  Blood Culture (routine x 2)     Status: None (Preliminary result)   Collection Time: 05/22/20  1:18 PM   Specimen: BLOOD  Result Value Ref Range Status   Specimen Description BLOOD LEFT ANTECUBITAL  Final   Special Requests   Final    BOTTLES DRAWN AEROBIC AND ANAEROBIC Blood Culture results may not be optimal due to an inadequate volume of blood received in culture bottles   Culture   Final    NO GROWTH 4 DAYS Performed at [facility], [address], [place], [place] [address]    Report Status PENDING  Incomplete     Labs: BNP (last 3 results) No results for input(s): BNP in the last 8760 hours. Basic Metabolic Panel: Recent Labs  Lab 05/22/20 1316 05/23/20 0615 05/24/20 1132 05/25/20 0739 05/26/20 0845  NA 146* 138 146* 148* 139  K 3.6 5.6* 4.2 3.9 3.6  CL 107 108 109 112* 108  CO2 25 21* 20* 24 25  GLUCOSE 128* 144* 113* 102* 98  BUN CREATININE 1.90* 1.45* 1.48* 1.57* 1.26*   CALCIUM 9.8 8.0* 8.9 8.7* 8.2*   Liver Function Tests: Recent Labs  Lab 05/22/20 1316 05/23/20 0615 05/24/20 1132 05/25/20 0739 05/26/20 0845  AST ALT ALKPHOS 91 62 65 62 55  BILITOT 0.6 0.4 0.7 0.6 0.6  PROT 8.0 5.9* 6.4* 6.2* 5.3*  ALBUMIN 3.7 2.6* 2.9* 2.9* 2.6*   No results for input(s): LIPASE, AMYLASE in the last 168 hours. No results for input(s): AMMONIA in the last 168 hours. CBC: Recent Labs  Lab 05/22/20 1316 05/23/20 0615 05/24/20 1132 05/25/20 0739  WBC 7.4 5.7 12.5* 10.2  NEUTROABS 5.0 4.6 10.7* 7.4  HGB 16.2 12.9* 13.4 13.7  HCT 51.1 39.2 41.7 41.2  MCV 92.9 91.6 93.7 91.6  PLT 169 109* 137* 125*   Cardiac Enzymes: No results for input(s): CKTOTAL, CKMB, CKMBINDEX, TROPONINI in the last 168 hours. BNP: Invalid input(s): POCBNP CBG: No results for input(s): GLUCAP in the last 168 hours. D-Dimer Recent Labs    05/25/20 0739 05/26/20 0845  DDIMER 3.79* 3.49*   Hgb A1c No results for input(s): HGBA1C in the last 72 hours. Lipid Profile No results for input(s): CHOL, HDL, LDLCALC, TRIG, CHOLHDL, LDLDIRECT in the last 72 hours. Thyroid function studies No results for input(s): TSH, T4TOTAL, T3FREE, THYROIDAB in the last 72 hours.  Invalid input(s): FREET3 Anemia work up No results for input(s): VITAMINB12, FOLATE, FERRITIN, TIBC, IRON, RETICCTPCT in the last 72 hours. Urinalysis    Component Value Date/Time   COLORURINE YELLOW 05/22/2020 1202   APPEARANCEUR CLEAR 05/22/2020 1202   LABSPEC 1.010 05/22/2020 1202   PHURINE 6.0 05/22/2020 1202   GLUCOSEU NEGATIVE 05/22/2020 1202   HGBUR MODERATE (A) 05/22/2020 1202   BILIRUBINUR NEGATIVE 05/22/2020 1202  KETONESUR NEGATIVE 05/22/2020 1202   PROTEINUR 30 (A) 05/22/2020 1202   NITRITE NEGATIVE 05/22/2020 1202   LEUKOCYTESUR SMALL (A) 05/22/2020 1202   Sepsis Labs Invalid input(s): PROCALCITONIN,  WBC,  LACTICIDVEN Microbiology Recent Results (from the past  240 hour(s))  Urine culture     Status: None   Collection Time: 05/22/20 12:11 PM   Specimen: In/Out Cath Urine  Result Value Ref Range Status   Specimen Description   Final    IN/OUT CATH URINE Performed at [facility], [address], [place], [place] [address]    Special Requests   Final    NONE Performed at [facility], [address], [place], [place] [address]    Culture   Final    NO GROWTH Performed at [facility], [address] [place], [place] [address]    Report Status 05/23/2020 FINAL  Final  SARS Coronavirus 2 by RT PCR (hospital order, performed in [facility] hospital lab) Nasopharyngeal Nasopharyngeal Swab     Status: Abnormal   Collection Time: 05/22/20 12:14 PM   Specimen: Nasopharyngeal Swab  Result Value Ref Range Status   SARS Coronavirus 2 POSITIVE (A) NEGATIVE Final    Comment: CRITICAL RESULT CALLED TO, READ BACK BY AND VERIFIED WITH: CRADWELL,L AT 1416 ON 8.30.21 BY ISLEY,B (NOTE) SARS-CoV-2 target nucleic acids are DETECTED  SARS-CoV-2 RNA is generally detectable in upper respiratory specimens  during the acute phase of infection.  Positive results are indicative  of the presence of the identified virus, but do not rule out bacterial infection or co-infection with other pathogens not detected by the test.  Clinical correlation with patient history and  other diagnostic information is necessary to determine patient infection status.  The expected result is negative.  Fact Sheet for Patients:   BoilerBrush.com.cy   Fact Sheet for Healthcare Providers:   https://pope.com/    This test is not yet approved or cleared by the [place] FDA and  has been authorized for detection and/or diagnosis of SARS-CoV-2 by FDA under an Emergency Use Authorization (EUA).  This EUA will remain in effect ( meaning this test can be used) for the duration of  the COVID-19 declaration under Section 564(b)(1) of  the Act, 21 U.S.C. section 360-bbb-3(b)(1), unless the authorization is terminated or revoked sooner.  Performed at [facility], [address], [place], [place] [address]   Blood Culture (routine x 2)     Status: None (Preliminary result)   Collection Time: 05/22/20  1:17 PM   Specimen: BLOOD  Result Value Ref Range Status   Specimen Description BLOOD RIGHT ANTECUBITAL  Final   Special Requests   Final    BOTTLES DRAWN AEROBIC AND ANAEROBIC Blood Culture results may not be optimal due to an inadequate volume of blood received in culture bottles   Culture   Final    NO GROWTH 4 DAYS Performed at [facility], [address], [place], [place] [address]    Report Status PENDING  Incomplete  Blood Culture (routine x 2)     Status: None (Preliminary result)   Collection Time: 05/22/20  1:18 PM   Specimen: BLOOD  Result Value Ref Range Status   Specimen Description BLOOD LEFT ANTECUBITAL  Final   Special Requests   Final    BOTTLES DRAWN AEROBIC AND ANAEROBIC Blood Culture results may not be optimal due to an inadequate volume of blood received in culture bottles   Culture   Final    NO  GROWTH 4 DAYS Performed at [facility], [address] [place], [place] [id number]    Report Status PENDING  Incomplete   Time coordinating discharge: 39 minutes   SIGNED:  Standley Dakinslanford Versia Mignogna, MD  [facility] 05/26/2020, 2:06 PM How to contact the [facility] Attending or Consulting provider 7A - 7P or covering provider during after hours 7P -7A, for this patient?  1. Check the care team in [facility] and look for a) attending/consulting TR[facility] provider listed and b) the [facility] team listed 2. Log into [email or web address] and use [place]'s universal password to access. If you do not have the password, please contact the hospital operator. 3. Locate the [facility] provider you are looking for under [facility] and page to a number that you can be directly reached. 4. If you still have difficulty reaching the provider,  please page the [facility] (Director on Call) for the Hospitalists listed on amion for assistance.

## 2020-05-26 NOTE — Discharge Instructions (Signed)
COVID-19 COVID-19 is a respiratory infection that is caused by a virus called severe acute respiratory syndrome coronavirus 2 (SARS-CoV-2). The disease is also known as coronavirus disease or novel coronavirus. In some people, the virus may not cause any symptoms. In others, it may cause a serious infection. The infection can get worse quickly and can lead to complications, such as:  Pneumonia, or infection of the lungs.  Acute respiratory distress syndrome or ARDS. This is a condition in which fluid build-up in the lungs prevents the lungs from filling with air and passing oxygen into the blood.  Acute respiratory failure. This is a condition in which there is not enough oxygen passing from the lungs to the body or when carbon dioxide is not passing from the lungs out of the body.  Sepsis or septic shock. This is a serious bodily reaction to an infection.  Blood clotting problems.  Secondary infections due to bacteria or fungus.  Organ failure. This is when your body's organs stop working. The virus that causes COVID-19 is contagious. This means that it can spread from person to person through droplets from coughs and sneezes (respiratory secretions). What are the causes? This illness is caused by a virus. You may catch the virus by:  Breathing in droplets from an infected person. Droplets can be spread by a person breathing, speaking, singing, coughing, or sneezing.  Touching something, like a table or a doorknob, that was exposed to the virus (contaminated) and then touching your mouth, nose, or eyes. What increases the risk? Risk for infection You are more likely to be infected with this virus if you:  Are within 6 feet (2 meters) of a person with COVID-19.  Provide care for or live with a person who is infected with COVID-19.  Spend time in crowded indoor spaces or live in shared housing. Risk for serious illness You are more likely to become seriously ill from the virus if  you:  Are 50 years of age or older. The higher your age, the more you are at risk for serious illness.  Live in a nursing home or long-term care facility.  Have cancer.  Have a long-term (chronic) disease such as: ? Chronic lung disease, including chronic obstructive pulmonary disease or asthma. ? A long-term disease that lowers your body's ability to fight infection (immunocompromised). ? Heart disease, including heart failure, a condition in which the arteries that lead to the heart become narrow or blocked (coronary artery disease), a disease which makes the heart muscle thick, weak, or stiff (cardiomyopathy). ? Diabetes. ? Chronic kidney disease. ? Sickle cell disease, a condition in which red blood cells have an abnormal "sickle" shape. ? Liver disease.  Are obese. What are the signs or symptoms? Symptoms of this condition can range from mild to severe. Symptoms may appear any time from 2 to 14 days after being exposed to the virus. They include:  A fever or chills.  A cough.  Difficulty breathing.  Headaches, body aches, or muscle aches.  Runny or stuffy (congested) nose.  A sore throat.  New loss of taste or smell. Some people may also have stomach problems, such as nausea, vomiting, or diarrhea. Other people may not have any symptoms of COVID-19. How is this diagnosed? This condition may be diagnosed based on:  Your signs and symptoms, especially if: ? You live in an area with a COVID-19 outbreak. ? You recently traveled to or from an area where the virus is common. ? You   provide care for or live with a person who was diagnosed with COVID-19. ? You were exposed to a person who was diagnosed with COVID-19.  A physical exam.  Lab tests, which may include: ? Taking a sample of fluid from the back of your nose and throat (nasopharyngeal fluid), your nose, or your throat using a swab. ? A sample of mucus from your lungs (sputum). ? Blood tests.  Imaging tests,  which may include, X-rays, CT scan, or ultrasound. How is this treated? At present, there is no medicine to treat COVID-19. Medicines that treat other diseases are being used on a trial basis to see if they are effective against COVID-19. Your health care provider will talk with you about ways to treat your symptoms. For most people, the infection is mild and can be managed at home with rest, fluids, and over-the-counter medicines. Treatment for a serious infection usually takes places in a hospital intensive care unit (ICU). It may include one or more of the following treatments. These treatments are given until your symptoms improve.  Receiving fluids and medicines through an IV.  Supplemental oxygen. Extra oxygen is given through a tube in the nose, a face mask, or a hood.  Positioning you to lie on your stomach (prone position). This makes it easier for oxygen to get into the lungs.  Continuous positive airway pressure (CPAP) or bi-level positive airway pressure (BPAP) machine. This treatment uses mild air pressure to keep the airways open. A tube that is connected to a motor delivers oxygen to the body.  Ventilator. This treatment moves air into and out of the lungs by using a tube that is placed in your windpipe.  Tracheostomy. This is a procedure to create a hole in the neck so that a breathing tube can be inserted.  Extracorporeal membrane oxygenation (ECMO). This procedure gives the lungs a chance to recover by taking over the functions of the heart and lungs. It supplies oxygen to the body and removes carbon dioxide. Follow these instructions at home: Lifestyle  If you are sick, stay home except to get medical care. Your health care provider will tell you how long to stay home. Call your health care provider before you go for medical care.  Rest at home as told by your health care provider.  Do not use any products that contain nicotine or tobacco, such as cigarettes,  e-cigarettes, and chewing tobacco. If you need help quitting, ask your health care provider.  Return to your normal activities as told by your health care provider. Ask your health care provider what activities are safe for you. General instructions  Take over-the-counter and prescription medicines only as told by your health care provider.  Drink enough fluid to keep your urine pale yellow.  Keep all follow-up visits as told by your health care provider. This is important. How is this prevented?  There is no vaccine to help prevent COVID-19 infection. However, there are steps you can take to protect yourself and others from this virus. To protect yourself:   Do not travel to areas where COVID-19 is a risk. The areas where COVID-19 is reported change often. To identify high-risk areas and travel restrictions, check the CDC travel website: wwwnc.cdc.gov/travel/notices  If you live in, or must travel to, an area where COVID-19 is a risk, take precautions to avoid infection. ? Stay away from people who are sick. ? Wash your hands often with soap and water for 20 seconds. If soap and water   are not available, use an alcohol-based hand sanitizer. ? Avoid touching your mouth, face, eyes, or nose. ? Avoid going out in public, follow guidance from your state and local health authorities. ? If you must go out in public, wear a cloth face covering or face mask. Make sure your mask covers your nose and mouth. ? Avoid crowded indoor spaces. Stay at least 6 feet (2 meters) away from others. ? Disinfect objects and surfaces that are frequently touched every day. This may include:  Counters and tables.  Doorknobs and light switches.  Sinks and faucets.  Electronics, such as phones, remote controls, keyboards, computers, and tablets. To protect others: If you have symptoms of COVID-19, take steps to prevent the virus from spreading to others.  If you think you have a COVID-19 infection, contact  your health care provider right away. Tell your health care team that you think you may have a COVID-19 infection.  Stay home. Leave your house only to seek medical care. Do not use public transport.  Do not travel while you are sick.  Wash your hands often with soap and water for 20 seconds. If soap and water are not available, use alcohol-based hand sanitizer.  Stay away from other members of your household. Let healthy household members care for children and pets, if possible. If you have to care for children or pets, wash your hands often and wear a mask. If possible, stay in your own room, separate from others. Use a different bathroom.  Make sure that all people in your household wash their hands well and often.  Cough or sneeze into a tissue or your sleeve or elbow. Do not cough or sneeze into your hand or into the air.  Wear a cloth face covering or face mask. Make sure your mask covers your nose and mouth. Where to find more information  Centers for Disease Control and Prevention: www.cdc.gov/coronavirus/2019-ncov/index.html  World Health Organization: www.who.int/health-topics/coronavirus Contact a health care provider if:  You live in or have traveled to an area where COVID-19 is a risk and you have symptoms of the infection.  You have had contact with someone who has COVID-19 and you have symptoms of the infection. Get help right away if:  You have trouble breathing.  You have pain or pressure in your chest.  You have confusion.  You have bluish lips and fingernails.  You have difficulty waking from sleep.  You have symptoms that get worse. These symptoms may represent a serious problem that is an emergency. Do not wait to see if the symptoms will go away. Get medical help right away. Call your local emergency services (911 in the [place] Do not drive yourself to the hospital. Let the emergency medical personnel know if you think you have  COVID-19. Summary  COVID-19 is a respiratory infection that is caused by a virus. It is also known as coronavirus disease or novel coronavirus. It can cause serious infections, such as pneumonia, acute respiratory distress syndrome, acute respiratory failure, or sepsis.  The virus that causes COVID-19 is contagious. This means that it can spread from person to person through droplets from breathing, speaking, singing, coughing, or sneezing.  You are more likely to develop a serious illness if you are 50 years of age or older, have a weak immune system, live in a nursing home, or have chronic disease.  There is no medicine to treat COVID-19. Your health care provider will talk with you about ways to treat your symptoms.    Take steps to protect yourself and others from infection. Wash your hands often and disinfect objects and surfaces that are frequently touched every day. Stay away from people who are sick and wear a mask if you are sick. This information is not intended to replace advice given to you by your health care provider. Make sure you discuss any questions you have with your health care provider. Document Revised: 07/09/2019 Document Reviewed: 10/15/2018 Elsevier Patient Education  2020 [employer]   COVID-19 Frequently Asked Questions COVID-19 (coronavirus disease) is an infection that is caused by a large family of viruses. Some viruses cause illness in people and others cause illness in animals like camels, cats, and bats. In some cases, the viruses that cause illness in animals can spread to humans. Where did the coronavirus come from? In December 2019, [place] told the World Health Organization (WHO) of several cases of lung disease (human respiratory illness). These cases were linked to an open seafood and livestock market in the city of [place]. The link to the seafood and livestock market suggests that the virus may have spread from animals to humans. However, since that first  outbreak in December, the virus has also been shown to spread from person to person. What is the name of the disease and the virus? Disease name Early on, this disease was called novel coronavirus. This is because scientists determined that the disease was caused by a new (novel) respiratory virus. The World Health Organization (WHO) has now named the disease COVID-19, or coronavirus disease. Virus name The virus that causes the disease is called severe acute respiratory syndrome coronavirus 2 (SARS-CoV-2). More information on disease and virus naming World Health Organization (WHO): www.who.int/emergencies/diseases/novel-coronavirus-2019/technical-guidance/naming-the-coronavirus-disease-(covid-2019)-and-the-virus-that-causes-it Who is at risk for complications from coronavirus disease? Some people may be at higher risk for complications from coronavirus disease. This includes older adults and people who have chronic diseases, such as heart disease, diabetes, and lung disease. If you are at higher risk for complications, take these extra precautions:  Stay home as much as possible.  Avoid social gatherings and travel.  Avoid close contact with others. Stay at least 6 ft (2 m) away from others, if possible.  Wash your hands often with soap and water for at least 20 seconds.  Avoid touching your face, mouth, nose, or eyes.  Keep supplies on hand at home, such as food, medicine, and cleaning supplies.  If you must go out in public, wear a cloth face covering or face mask. Make sure your mask covers your nose and mouth. How does coronavirus disease spread? The virus that causes coronavirus disease spreads easily from person to person (is contagious). You may catch the virus by:  Breathing in droplets from an infected person. Droplets can be spread by a person breathing, speaking, singing, coughing, or sneezing.  Touching something, like a table or a doorknob, that was exposed to the virus  (contaminated) and then touching your mouth, nose, or eyes. Can I get the virus from touching surfaces or objects? There is still a lot that we do not know about the virus that causes coronavirus disease. Scientists are basing a lot of information on what they know about similar viruses, such as:  Viruses cannot generally survive on surfaces for long. They need a human body (host) to survive.  It is more likely that the virus is spread by close contact with people who are sick (direct contact), such as through: ? Shaking hands or hugging. ? Breathing in respiratory droplets   that travel through the air. Droplets can be spread by a person breathing, speaking, singing, coughing, or sneezing.  It is less likely that the virus is spread when a person touches a surface or object that has the virus on it (indirect contact). The virus may be able to enter the body if the person touches a surface or object and then touches his or her face, eyes, nose, or mouth. Can a person spread the virus without having symptoms of the disease? It may be possible for the virus to spread before a person has symptoms of the disease, but this is most likely not the main way the virus is spreading. It is more likely for the virus to spread by being in close contact with people who are sick and breathing in the respiratory droplets spread by a person breathing, speaking, singing, coughing, or sneezing. What are the symptoms of coronavirus disease? Symptoms vary from person to person and can range from mild to severe. Symptoms may include:  Fever or chills.  Cough.  Difficulty breathing or feeling short of breath.  Headaches, body aches, or muscle aches.  Runny or stuffy (congested) nose.  Sore throat.  New loss of taste or smell.  Nausea, vomiting, or diarrhea. These symptoms can appear anywhere from 2 to 14 days after you have been exposed to the virus. Some people may not have any symptoms. If you develop  symptoms, call your health care provider. People with severe symptoms may need hospital care. Should I be tested for this virus? Your health care provider will decide whether to test you based on your symptoms, history of exposure, and your risk factors. How does a health care provider test for this virus? Health care providers will collect samples to send for testing. Samples may include:  Taking a swab of fluid from the back of your nose and throat, your nose, or your throat.  Taking fluid from the lungs by having you cough up mucus (sputum) into a sterile cup.  Taking a blood sample. Is there a treatment or vaccine for this virus? Currently, there is no vaccine to prevent coronavirus disease. Also, there are no medicines like antibiotics or antivirals to treat the virus. A person who becomes sick is given supportive care, which means rest and fluids. A person may also relieve his or her symptoms by using over-the-counter medicines that treat sneezing, coughing, and runny nose. These are the same medicines that a person takes for the common cold. If you develop symptoms, call your health care provider. People with severe symptoms may need hospital care. What can I do to protect myself and my family from this virus?     You can protect yourself and your family by taking the same actions that you would take to prevent the spread of other viruses. Take the following actions:  Wash your hands often with soap and water for at least 20 seconds. If soap and water are not available, use alcohol-based hand sanitizer.  Avoid touching your face, mouth, nose, or eyes.  Cough or sneeze into a tissue, sleeve, or elbow. Do not cough or sneeze into your hand or the air. ? If you cough or sneeze into a tissue, throw it away immediately and wash your hands.  Disinfect objects and surfaces that you frequently touch every day.  Stay away from people who are sick.  Avoid going out in public, follow  guidance from your state and local health authorities.  Avoid crowded indoor spaces.   Stay at least 6 ft (2 m) away from others.  If you must go out in public, wear a cloth face covering or face mask. Make sure your mask covers your nose and mouth.  Stay home if you are sick, except to get medical care. Call your health care provider before you get medical care. Your health care provider will tell you how long to stay home.  Make sure your vaccines are up to date. Ask your health care provider what vaccines you need. What should I do if I need to travel? Follow travel recommendations from your local health authority, the CDC, and WHO. Travel information and advice  Centers for Disease Control and Prevention (CDC): www.cdc.gov/coronavirus/2019-ncov/travelers/index.html  World Health Organization (WHO): www.who.int/emergencies/diseases/novel-coronavirus-2019/travel-advice Know the risks and take action to protect your health  You are at higher risk of getting coronavirus disease if you are traveling to areas with an outbreak or if you are exposed to travelers from areas with an outbreak.  Wash your hands often and practice good hygiene to lower the risk of catching or spreading the virus. What should I do if I am sick? General instructions to stop the spread of infection  Wash your hands often with soap and water for at least 20 seconds. If soap and water are not available, use alcohol-based hand sanitizer.  Cough or sneeze into a tissue, sleeve, or elbow. Do not cough or sneeze into your hand or the air.  If you cough or sneeze into a tissue, throw it away immediately and wash your hands.  Stay home unless you must get medical care. Call your health care provider or local health authority before you get medical care.  Avoid public areas. Do not take public transportation, if possible.  If you can, wear a mask if you must go out of the house or if you are in close contact with someone  who is not sick. Make sure your mask covers your nose and mouth. Keep your home clean  Disinfect objects and surfaces that are frequently touched every day. This may include: ? Counters and tables. ? Doorknobs and light switches. ? Sinks and faucets. ? Electronics such as phones, remote controls, keyboards, computers, and tablets.  Wash dishes in hot, soapy water or use a dishwasher. Air-dry your dishes.  Wash laundry in hot water. Prevent infecting other household members  Let healthy household members care for children and pets, if possible. If you have to care for children or pets, wash your hands often and wear a mask.  Sleep in a different bedroom or bed, if possible.  Do not share personal items, such as razors, toothbrushes, deodorant, combs, brushes, towels, and washcloths. Where to find more information Centers for Disease Control and Prevention (CDC)  Information and news updates: www.cdc.gov/coronavirus/2019-ncov World Health Organization (WHO)  Information and news updates: www.who.int/emergencies/diseases/novel-coronavirus-2019  Coronavirus health topic: www.who.int/health-topics/coronavirus  Questions and answers on COVID-19: www.who.int/news-room/q-a-detail/q-a-coronaviruses  Global tracker: who.sprinklr.com American Academy of Pediatrics (AAP)  Information for families: www.healthychildren.org/English/health-issues/conditions/chest-lungs/Pages/2019-Novel-Coronavirus.aspx The coronavirus situation is changing rapidly. Check your local health authority website or the CDC and WHO websites for updates and news. When should I contact a health care provider?  Contact your health care provider if you have symptoms of an infection, such as fever or cough, and you: ? Have been near anyone who is known to have coronavirus disease. ? Have come into contact with a person who is suspected to have coronavirus disease. ? Have traveled to an area where there is   an outbreak of  COVID-19. When should I get emergency medical care?  Get help right away by calling your local emergency services (911 in the [place] if you have: ? Trouble breathing. ? Pain or pressure in your chest. ? Confusion. ? Blue-tinged lips and fingernails. ? Difficulty waking from sleep. ? Symptoms that get worse. Let the emergency medical personnel know if you think you have coronavirus disease. Summary  A new respiratory virus is spreading from person to person and causing COVID-19 (coronavirus disease).  The virus that causes COVID-19 appears to spread easily. It spreads from one person to another through droplets from breathing, speaking, singing, coughing, or sneezing.  Older adults and those with chronic diseases are at higher risk of disease. If you are at higher risk for complications, take extra precautions.  There is currently no vaccine to prevent coronavirus disease. There are no medicines, such as antibiotics or antivirals, to treat the virus.  You can protect yourself and your family by washing your hands often, avoiding touching your face, and covering your coughs and sneezes. This information is not intended to replace advice given to you by your health care provider. Make sure you discuss any questions you have with your health care provider. Document Revised: 07/09/2019 Document Reviewed: 01/05/2019 Elsevier Patient Education  2020 [employer].  COVID-19: Quarantine vs. Isolation QUARANTINE keeps someone who was in close contact with someone who has COVID-19 away from others. If you had close contact with a person who has COVID-19  Stay home until 14 days after your last contact.  Check your temperature twice a day and watch for symptoms of COVID-19.  If possible, stay away from people who are at higher-risk for getting very sick from COVID-19. ISOLATION keeps someone who is sick or tested positive for COVID-19 without symptoms away from others, even in their own  home. If you are sick and think or know you have COVID-19  Stay home until after ? At least 10 days since symptoms first appeared and ? At least 24 hours with no fever without fever-reducing medication and ? Symptoms have improved If you tested positive for COVID-19 but do not have symptoms  Stay home until after ? 10 days have passed since your positive test If you live with others, stay in a specific "sick room" or area and away from other people or animals, including pets. Use a separate bathroom, if available. cdc.gov/coronavirus 04/12/2019 This information is not intended to replace advice given to you by your health care provider. Make sure you discuss any questions you have with your health care provider. Document Revised: 08/26/2019 Document Reviewed: 08/26/2019 Elsevier Patient Education  2020 [employer] Inc.  

## 2020-05-27 LAB — CULTURE, BLOOD (ROUTINE X 2)
Culture: NO GROWTH
Culture: NO GROWTH

## 2020-10-06 ENCOUNTER — Encounter (HOSPITAL_COMMUNITY): Payer: Self-pay

## 2020-10-06 ENCOUNTER — Emergency Department (HOSPITAL_COMMUNITY)
Admission: EM | Admit: 2020-10-06 | Discharge: 2020-10-06 | Disposition: A | Discharge status: Home / Self Care | Payer: Medicare Other | Attending: Emergency Medicine | Admitting: Emergency Medicine

## 2020-10-06 ENCOUNTER — Emergency Department (HOSPITAL_COMMUNITY): Payer: Medicare Other

## 2020-10-06 ENCOUNTER — Other Ambulatory Visit: Payer: Self-pay

## 2020-10-06 DIAGNOSIS — R4182 Altered mental status, unspecified: Secondary | ICD-10-CM | POA: Insufficient documentation

## 2020-10-06 DIAGNOSIS — F0281 Dementia in other diseases classified elsewhere with behavioral disturbance: Secondary | ICD-10-CM | POA: Diagnosis not present

## 2020-10-06 DIAGNOSIS — E039 Hypothyroidism, unspecified: Secondary | ICD-10-CM | POA: Diagnosis not present

## 2020-10-06 DIAGNOSIS — R404 Transient alteration of awareness: Secondary | ICD-10-CM

## 2020-10-06 DIAGNOSIS — S0990XA Unspecified injury of head, initial encounter: Secondary | ICD-10-CM

## 2020-10-06 DIAGNOSIS — Y92129 Unspecified place in nursing home as the place of occurrence of the external cause: Secondary | ICD-10-CM | POA: Insufficient documentation

## 2020-10-06 DIAGNOSIS — Z8616 Personal history of COVID-19: Secondary | ICD-10-CM | POA: Diagnosis not present

## 2020-10-06 DIAGNOSIS — N1832 Chronic kidney disease, stage 3b: Secondary | ICD-10-CM | POA: Diagnosis not present

## 2020-10-06 DIAGNOSIS — G309 Alzheimer's disease, unspecified: Secondary | ICD-10-CM | POA: Diagnosis not present

## 2020-10-06 DIAGNOSIS — W19XXXA Unspecified fall, initial encounter: Secondary | ICD-10-CM | POA: Insufficient documentation

## 2020-10-06 LAB — CBC WITH DIFFERENTIAL/PLATELET
Abs Immature Granulocytes: 0.02 10*3/uL (ref 0.00–0.07)
Basophils Absolute: 0 10*3/uL (ref 0.0–0.1)
Basophils Relative: 0 %
Eosinophils Absolute: 0 10*3/uL (ref 0.0–0.5)
Eosinophils Relative: 0 %
HCT: 52.2 % — ABNORMAL HIGH (ref 39.0–52.0)
Hemoglobin: 16.9 g/dL (ref 13.0–17.0)
Immature Granulocytes: 0 %
Lymphocytes Relative: 21 %
Lymphs Abs: 1.5 10*3/uL (ref 0.7–4.0)
MCH: 29.1 pg (ref 26.0–34.0)
MCHC: 32.4 g/dL (ref 30.0–36.0)
MCV: 90 fL (ref 80.0–100.0)
Monocytes Absolute: 0.6 10*3/uL (ref 0.1–1.0)
Monocytes Relative: 9 %
Neutro Abs: 4.8 10*3/uL (ref 1.7–7.7)
Neutrophils Relative %: 70 %
Platelets: 175 10*3/uL (ref 150–400)
RBC: 5.8 MIL/uL (ref 4.22–5.81)
RDW: 14.7 % (ref 11.5–15.5)
WBC: 7.1 10*3/uL (ref 4.0–10.5)
nRBC: 0 % (ref 0.0–0.2)

## 2020-10-06 LAB — URINALYSIS, ROUTINE W REFLEX MICROSCOPIC
Bilirubin Urine: NEGATIVE
Glucose, UA: NEGATIVE mg/dL
Ketones, ur: NEGATIVE mg/dL
Leukocytes,Ua: NEGATIVE
Nitrite: NEGATIVE
Protein, ur: 100 mg/dL — AB
Specific Gravity, Urine: 1.011 (ref 1.005–1.030)
pH: 5 (ref 5.0–8.0)

## 2020-10-06 LAB — COMPREHENSIVE METABOLIC PANEL
ALT: 8 U/L (ref 0–44)
AST: 21 U/L (ref 15–41)
Albumin: 3.9 g/dL (ref 3.5–5.0)
Alkaline Phosphatase: 86 U/L (ref 38–126)
Anion gap: 13 (ref 5–15)
BUN: 13 mg/dL (ref 8–23)
CO2: 26 mmol/L (ref 22–32)
Calcium: 10.1 mg/dL (ref 8.9–10.3)
Chloride: 109 mmol/L (ref 98–111)
Creatinine, Ser: 1.41 mg/dL — ABNORMAL HIGH (ref 0.61–1.24)
GFR, Estimated: 49 mL/min — ABNORMAL LOW (ref >60)
Glucose, Bld: 119 mg/dL — ABNORMAL HIGH (ref 70–99)
Potassium: 4.6 mmol/L (ref 3.5–5.1)
Sodium: 148 mmol/L — ABNORMAL HIGH (ref 135–145)
Total Bilirubin: 0.7 mg/dL (ref 0.3–1.2)
Total Protein: 8.2 g/dL — ABNORMAL HIGH (ref 6.5–8.1)

## 2020-10-06 MED ORDER — SODIUM CHLORIDE 0.9 % IV BOLUS
500.0000 mL | Freq: Once | INTRAVENOUS | Status: AC
  Administered 2020-10-06: 500 mL via INTRAVENOUS

## 2020-10-06 NOTE — Progress Notes (Signed)
Attempted to call report to [facility] three times, no one answered the phone.

## 2020-10-06 NOTE — ED Provider Notes (Signed)
Emergency Department Provider Note   I have reviewed the triage vital signs and the nursing notes.   HISTORY  Chief Complaint Altered Mental Status   HPI Robert Hurst is a 87 y.o. male with past medical history reviewed below presents to the emergency department by EMS after a fall at [facility].  According to EMS the patient had an episode of decreased responsiveness after the event.  EMS state that he has been responsive to verbal stimuli and somewhat combative with them at times but easy to redirect.  Patient arrives with a MOST form outlining wishes for DNR/DNI and mainly comfort care. EMS also note that patient's medications were recently stopped but nurse who gave report was not sure why. We called to discuss directly with facility but nursing not family with patient specifically.   Level 5 caveat: AMS.   Past Medical History:  Diagnosis Date  . Alzheimer disease (HCC)   . Calculus of bile duct (any) with acute cholecystitis   . Cognitive communication deficit   . COVID-19   . Dementia (HCC)   . DVT (deep venous thrombosis) (HCC)   . Dysphagia   . Malnourished (HCC)   . Metabolic encephalopathy   . Protein calorie malnutrition (HCC)   . Renal disorder   . Syncope and collapse   . Thyroid disease     Patient Active Problem List   Diagnosis Date Noted  . COVID-19   . Advanced care planning/counseling discussion   . Palliative care by specialist   . Sepsis due to undetermined organism (HCC) 05/22/2020  . Hypotension due to hypovolemia   . Thrombocytopenia (HCC) 05/03/2020  . Sepsis due to Enterococcus (HCC) 05/03/2020  . Acute on chronic renal insufficiency   . UTI (urinary tract infection) 04/30/2020  . CKD (chronic kidney disease), stage III (HCC) 02/12/2020  . Hypothyroidism 02/12/2020  . History of DVT (deep vein thrombosis)   . History of COVID-19   . Acute cystitis without hematuria   . Goals of care, counseling/discussion   . Palliative  care encounter   . Acute metabolic encephalopathy 08/11/2019  . Syncope and collapse 08/11/2019  . Syncope   . Dementia without behavioral disturbance (HCC) 08/08/2019  . Acute renal failure superimposed on stage 3b chronic kidney disease (HCC) 08/08/2019  . Lactic acidosis 08/08/2019  . Incontinence 08/08/2019    Past Surgical History:  Procedure Laterality Date  . APPENDECTOMY    . BALLOON DILATION N/A 01/15/2018   Procedure: BALLOON DILATION;  Surgeon: Malissa Hippo, MD;  [facility];  Service: Endoscopy;  Laterality: N/A;  . ERCP N/A 01/15/2018   Procedure: ENDOSCOPIC RETROGRADE CHOLANGIOPANCREATOGRAPHY (ERCP);  Surgeon: Malissa Hippo, MD;  [facility];  Service: Endoscopy;  Laterality: N/A;  . REMOVAL OF STONES N/A 01/15/2018   Procedure: REMOVAL OF STONES;  Surgeon: Malissa Hippo, MD;  [facility];  Service: Endoscopy;  Laterality: N/A;  . SPHINCTEROTOMY N/A 01/15/2018   Procedure: SPHINCTEROTOMY;  Surgeon: Malissa Hippo, MD;  [facility];  Service: Endoscopy;  Laterality: N/A;  . TONSILLECTOMY      Allergies Penicillins  No family history on file.  Social History Social History   Tobacco Use  . Smoking status: Never Smoker  . Smokeless tobacco: Never Used  Vaping Use  . Vaping Use: Unknown  Substance Use Topics  . Alcohol use: Not Currently  . Drug use: Not Currently    Review of Systems  Level  5 caveat: AMS.   ____________________________________________   PHYSICAL EXAM:  VITAL SIGNS: Vitals:   10/06/20 1900 10/06/20 2000  BP: 118/88 118/79  Pulse: (!) 58 62  Resp: 13 16  Temp:    SpO2: 98% 94%    Constitutional: Eyes closed but opens to voice and gentle touch.  Eyes: Conjunctivae are normal.  Head: Mild swelling to the left temporal area without ecchymosis.  Nose: No congestion/rhinnorhea. Neck: No stridor.  No apparent c-spine tenderness on exam.  Cardiovascular: Normal rate, regular  rhythm. Good peripheral circulation. Grossly normal heart sounds.   Respiratory: Normal respiratory effort.  No retractions. Lungs CTAB. Gastrointestinal: Soft and nontender. No distention.  Musculoskeletal:No gross deformities of extremities. Neurologic: Confused (baseline) but decreased alertness compared to baseline described by EMS. No apparent unilateral weakness but exam limited.  Skin:  Skin is warm, dry and intact. No rash noted.   ____________________________________________   LABS (all labs ordered are listed, but only abnormal results are displayed)  Labs Reviewed  COMPREHENSIVE METABOLIC PANEL - Abnormal; Notable for the following components:      Result Value   Sodium 148 (*)    Glucose, Bld 119 (*)    Creatinine, Ser 1.41 (*)    Total Protein 8.2 (*)    GFR, Estimated 49 (*)    All other components within normal limits  CBC WITH DIFFERENTIAL/PLATELET - Abnormal; Notable for the following components:   HCT 52.2 (*)    All other components within normal limits  URINALYSIS, ROUTINE W REFLEX MICROSCOPIC - Abnormal; Notable for the following components:   Hgb urine dipstick MODERATE (*)    Protein, ur 100 (*)    Bacteria, UA RARE (*)    All other components within normal limits  URINE CULTURE   ____________________________________________  RADIOLOGY  CT Head Wo Contrast  Result Date: 10/06/2020 CLINICAL DATA:  Unwitnessed fall. EXAM: CT HEAD WITHOUT CONTRAST CT CERVICAL SPINE WITHOUT CONTRAST TECHNIQUE: Multidetector CT imaging of the head and cervical spine was performed following the standard protocol without intravenous contrast. Multiplanar CT image reconstructions of the cervical spine were also generated. COMPARISON:  May 22, 2020. FINDINGS: CT HEAD FINDINGS Brain: Mild diffuse cortical atrophy is noted. Mild chronic ischemic white matter disease is noted. No mass effect or midline shift is noted. Ventricular size is within normal limits. There is no evidence  of mass lesion, hemorrhage or acute infarction. Vascular: No hyperdense vessel or unexpected calcification. Skull: Normal. Negative for fracture or focal lesion. Sinuses/Orbits: No acute finding. Other: None. CT CERVICAL SPINE FINDINGS Alignment: Normal. Skull base and vertebrae: No acute fracture. No primary bone lesion or focal pathologic process. Soft tissues and spinal canal: No prevertebral fluid or swelling. No visible canal hematoma. Disc levels: Severe degenerative disc disease is noted at C5-6 and C6-7 with anterior posterior osteophyte formation. Upper chest: Negative. Other: None. IMPRESSION: 1. Mild diffuse cortical atrophy. Mild chronic ischemic white matter disease. No acute intracranial abnormality seen. 2. Severe multilevel degenerative disc disease. No acute abnormality seen in the cervical spine. Electronically Signed   By: Lupita Raider M.D.   On: 10/06/2020 18:43   CT Cervical Spine Wo Contrast  Result Date: 10/06/2020 CLINICAL DATA:  Unwitnessed fall. EXAM: CT HEAD WITHOUT CONTRAST CT CERVICAL SPINE WITHOUT CONTRAST TECHNIQUE: Multidetector CT imaging of the head and cervical spine was performed following the standard protocol without intravenous contrast. Multiplanar CT image reconstructions of the cervical spine were also generated. COMPARISON:  May 22, 2020. FINDINGS: CT HEAD  FINDINGS Brain: Mild diffuse cortical atrophy is noted. Mild chronic ischemic white matter disease is noted. No mass effect or midline shift is noted. Ventricular size is within normal limits. There is no evidence of mass lesion, hemorrhage or acute infarction. Vascular: No hyperdense vessel or unexpected calcification. Skull: Normal. Negative for fracture or focal lesion. Sinuses/Orbits: No acute finding. Other: None. CT CERVICAL SPINE FINDINGS Alignment: Normal. Skull base and vertebrae: No acute fracture. No primary bone lesion or focal pathologic process. Soft tissues and spinal canal: No prevertebral fluid  or swelling. No visible canal hematoma. Disc levels: Severe degenerative disc disease is noted at C5-6 and C6-7 with anterior posterior osteophyte formation. Upper chest: Negative. Other: None. IMPRESSION: 1. Mild diffuse cortical atrophy. Mild chronic ischemic white matter disease. No acute intracranial abnormality seen. 2. Severe multilevel degenerative disc disease. No acute abnormality seen in the cervical spine. Electronically Signed   By: Lupita Raider M.D.   On: 10/06/2020 18:43    ____________________________________________   PROCEDURES  Procedure(s) performed:   Procedures  None  ____________________________________________   INITIAL IMPRESSION / ASSESSMENT AND PLAN / ED COURSE  Pertinent labs & imaging results that were available during my care of the patient were reviewed by me and considered in my medical decision making (see chart for details).   Patient presents to the emergency department for evaluation of fall today with decreased alertness per EMS and staff at facility although facility staff is overall not familiar with the patient.  The nurses telling us that she is actually taking care of patients on the other side of the building and the nurse who cares for him regularly is not available.  Patient arrives with a MOST form.  Vital signs are largely unremarkable.  Plan to start with CT imaging of the head and discuss further with family by phone.   06:20 PM  Spoke with the patient's daughter, [name] and then 2 other siblings by conference call.  We discussed care options and testing options.  The family is in agreement that we will move forward with CT imaging of the head and neck and hold on additional testing for now.  He does have frequent urine infections but they are not sure if they want to put him through a catheter to get a urine sample or blood draw at this time.  After further discussion with the family they are okay with moving forward with blood work, IV  fluids, urine testing.  CT imaging of the head and neck shows no acute findings.    Lab work reviewed showing some mild dehydration but no other acute findings.  No evidence of urinary tract infection.  Plan to send urine for culture but no antibiotics for now.  Called to discuss with the patient's daughter, [name].  She is comfortable with the plan for discharge back to [facility] with PCP follow-up and return with any new or suddenly worsening symptoms. ____________________________________________  FINAL CLINICAL IMPRESSION(S) / ED DIAGNOSES  Final diagnoses:  Transient alteration of awareness  Fall, initial encounter  Injury of head, initial encounter    MEDICATIONS GIVEN DURING THIS VISIT:  Medications  sodium chloride 0.9 % bolus 500 mL (500 mLs Intravenous New Bag/Given 10/06/20 2055)    Note:  This document was prepared using Dragon voice recognition software and may include unintentional dictation errors.  Alona Bene, MD, [facility] Emergency Medicine    Simcha Farrington, Arlyss Repress, MD 10/06/20 2156

## 2020-10-06 NOTE — ED Triage Notes (Signed)
Pt sent from [facility] due to fall and decreased responsiveness. Reported that pt is normally more talkative. . Staff found pt on floor under bed. EMS report pt as being combative. Responsive to verbal stimuli

## 2020-10-06 NOTE — Discharge Instructions (Signed)
You were seen in the emergency room today with change in mental status after being found on the ground.  The CT scans of your head and neck were normal.  Your lab work shows some mild dehydration but no sign of infection.  Your urine test today did not show infection.  We are sending it for culture and if infection grows from the urine that requires antibiotics you will be called.  Please return to the emergency department with any new or suddenly worsening symptoms.

## 2020-10-08 LAB — URINE CULTURE: Culture: NO GROWTH

## 2021-02-10 ENCOUNTER — Emergency Department (HOSPITAL_COMMUNITY): Payer: Medicare Other

## 2021-02-10 ENCOUNTER — Inpatient Hospital Stay (HOSPITAL_COMMUNITY)
Admission: EM | Admit: 2021-02-10 | Discharge: 2021-02-13 | DRG: 071 | Disposition: A | Discharge status: Skilled Nursing Facility | Payer: Medicare Other | Source: Skilled Nursing Facility | Attending: Family Medicine | Admitting: Family Medicine

## 2021-02-10 ENCOUNTER — Other Ambulatory Visit: Payer: Self-pay

## 2021-02-10 DIAGNOSIS — Z79890 Hormone replacement therapy (postmenopausal): Secondary | ICD-10-CM

## 2021-02-10 DIAGNOSIS — Z20822 Contact with and (suspected) exposure to covid-19: Secondary | ICD-10-CM | POA: Diagnosis present

## 2021-02-10 DIAGNOSIS — Z86718 Personal history of other venous thrombosis and embolism: Secondary | ICD-10-CM | POA: Diagnosis not present

## 2021-02-10 DIAGNOSIS — R41841 Cognitive communication deficit: Secondary | ICD-10-CM | POA: Diagnosis present

## 2021-02-10 DIAGNOSIS — N1832 Chronic kidney disease, stage 3b: Secondary | ICD-10-CM | POA: Diagnosis present

## 2021-02-10 DIAGNOSIS — Z880 Allergy status to penicillin: Secondary | ICD-10-CM | POA: Diagnosis not present

## 2021-02-10 DIAGNOSIS — G309 Alzheimer's disease, unspecified: Secondary | ICD-10-CM | POA: Diagnosis present

## 2021-02-10 DIAGNOSIS — N179 Acute kidney failure, unspecified: Secondary | ICD-10-CM | POA: Diagnosis present

## 2021-02-10 DIAGNOSIS — A419 Sepsis, unspecified organism: Secondary | ICD-10-CM | POA: Insufficient documentation

## 2021-02-10 DIAGNOSIS — R531 Weakness: Secondary | ICD-10-CM | POA: Diagnosis not present

## 2021-02-10 DIAGNOSIS — R6520 Severe sepsis without septic shock: Secondary | ICD-10-CM | POA: Diagnosis not present

## 2021-02-10 DIAGNOSIS — E872 Acidosis, unspecified: Secondary | ICD-10-CM | POA: Diagnosis present

## 2021-02-10 DIAGNOSIS — F0390 Unspecified dementia without behavioral disturbance: Secondary | ICD-10-CM | POA: Diagnosis present

## 2021-02-10 DIAGNOSIS — G9341 Metabolic encephalopathy: Secondary | ICD-10-CM | POA: Diagnosis not present

## 2021-02-10 DIAGNOSIS — Z7901 Long term (current) use of anticoagulants: Secondary | ICD-10-CM | POA: Diagnosis not present

## 2021-02-10 DIAGNOSIS — F0280 Dementia in other diseases classified elsewhere without behavioral disturbance: Secondary | ICD-10-CM | POA: Diagnosis present

## 2021-02-10 DIAGNOSIS — Z9114 Patient's other noncompliance with medication regimen: Secondary | ICD-10-CM

## 2021-02-10 DIAGNOSIS — E039 Hypothyroidism, unspecified: Secondary | ICD-10-CM | POA: Diagnosis not present

## 2021-02-10 DIAGNOSIS — Z79899 Other long term (current) drug therapy: Secondary | ICD-10-CM | POA: Diagnosis not present

## 2021-02-10 DIAGNOSIS — Z66 Do not resuscitate: Secondary | ICD-10-CM | POA: Diagnosis present

## 2021-02-10 DIAGNOSIS — R4182 Altered mental status, unspecified: Secondary | ICD-10-CM

## 2021-02-10 DIAGNOSIS — J9811 Atelectasis: Secondary | ICD-10-CM | POA: Diagnosis present

## 2021-02-10 LAB — COMPREHENSIVE METABOLIC PANEL
ALT: 15 U/L (ref 0–44)
AST: 31 U/L (ref 15–41)
Albumin: 4.4 g/dL (ref 3.5–5.0)
Alkaline Phosphatase: 106 U/L (ref 38–126)
Anion gap: 11 (ref 5–15)
BUN: 19 mg/dL (ref 8–23)
CO2: 27 mmol/L (ref 22–32)
Calcium: 10.5 mg/dL — ABNORMAL HIGH (ref 8.9–10.3)
Chloride: 106 mmol/L (ref 98–111)
Creatinine, Ser: 1.91 mg/dL — ABNORMAL HIGH (ref 0.61–1.24)
GFR, Estimated: 34 mL/min — ABNORMAL LOW (ref >60)
Glucose, Bld: 188 mg/dL — ABNORMAL HIGH (ref 70–99)
Potassium: 4.5 mmol/L (ref 3.5–5.1)
Sodium: 144 mmol/L (ref 135–145)
Total Bilirubin: 0.8 mg/dL (ref 0.3–1.2)
Total Protein: 9 g/dL — ABNORMAL HIGH (ref 6.5–8.1)

## 2021-02-10 LAB — URINALYSIS, ROUTINE W REFLEX MICROSCOPIC
Bacteria, UA: NONE SEEN
Bilirubin Urine: NEGATIVE
Glucose, UA: NEGATIVE mg/dL
Ketones, ur: 5 mg/dL — AB
Leukocytes,Ua: NEGATIVE
Nitrite: NEGATIVE
Protein, ur: 100 mg/dL — AB
RBC / HPF: >50 RBC/hpf — ABNORMAL HIGH (ref 0–5)
Specific Gravity, Urine: 1.011 (ref 1.005–1.030)
pH: 6 (ref 5.0–8.0)

## 2021-02-10 LAB — CBC WITH DIFFERENTIAL/PLATELET
Abs Immature Granulocytes: 0.04 10*3/uL (ref 0.00–0.07)
Basophils Absolute: 0.1 10*3/uL (ref 0.0–0.1)
Basophils Relative: 1 %
Eosinophils Absolute: 0 10*3/uL (ref 0.0–0.5)
Eosinophils Relative: 1 %
HCT: 56.6 % — ABNORMAL HIGH (ref 39.0–52.0)
Hemoglobin: 18.4 g/dL — ABNORMAL HIGH (ref 13.0–17.0)
Immature Granulocytes: 1 %
Lymphocytes Relative: 16 %
Lymphs Abs: 1.3 10*3/uL (ref 0.7–4.0)
MCH: 29.3 pg (ref 26.0–34.0)
MCHC: 32.5 g/dL (ref 30.0–36.0)
MCV: 90.1 fL (ref 80.0–100.0)
Monocytes Absolute: 0.7 10*3/uL (ref 0.1–1.0)
Monocytes Relative: 9 %
Neutro Abs: 6.1 10*3/uL (ref 1.7–7.7)
Neutrophils Relative %: 72 %
Platelets: 186 10*3/uL (ref 150–400)
RBC: 6.28 MIL/uL — ABNORMAL HIGH (ref 4.22–5.81)
RDW: 15.9 % — ABNORMAL HIGH (ref 11.5–15.5)
WBC: 8.3 10*3/uL (ref 4.0–10.5)
nRBC: 0 % (ref 0.0–0.2)

## 2021-02-10 LAB — RESP PANEL BY RT-PCR (FLU A&B, COVID) ARPGX2
Influenza A by PCR: NEGATIVE
Influenza B by PCR: NEGATIVE
SARS Coronavirus 2 by RT PCR: NEGATIVE

## 2021-02-10 LAB — PROTIME-INR
INR: 1.1 (ref 0.8–1.2)
Prothrombin Time: 13.9 seconds (ref 11.4–15.2)

## 2021-02-10 LAB — LACTIC ACID, PLASMA
Lactic Acid, Venous: 5.4 mmol/L (ref 0.5–1.9)
Lactic Acid, Venous: 4 mmol/L (ref 0.5–1.9)

## 2021-02-10 LAB — GLUCOSE, CAPILLARY: Glucose-Capillary: 109 mg/dL — ABNORMAL HIGH (ref 70–99)

## 2021-02-10 LAB — APTT: aPTT: 29 seconds (ref 24–36)

## 2021-02-10 MED ORDER — ENOXAPARIN SODIUM 60 MG/0.6ML IJ SOSY
1.0000 mg/kg | PREFILLED_SYRINGE | Freq: Two times a day (BID) | INTRAMUSCULAR | Status: DC
  Administered 2021-02-11: 52.5 mg via SUBCUTANEOUS
  Filled 2021-02-10: qty 0.6

## 2021-02-10 MED ORDER — VANCOMYCIN HCL IN DEXTROSE 1-5 GM/200ML-% IV SOLN
1000.0000 mg | INTRAVENOUS | Status: AC
  Administered 2021-02-11: 1000 mg via INTRAVENOUS
  Filled 2021-02-10: qty 200

## 2021-02-10 MED ORDER — SODIUM CHLORIDE 0.9 % IV SOLN
1.0000 g | Freq: Once | INTRAVENOUS | Status: AC
  Administered 2021-02-10: 1 g via INTRAVENOUS
  Filled 2021-02-10: qty 10

## 2021-02-10 MED ORDER — SODIUM CHLORIDE 0.9 % IV SOLN: 2.0000 g | INTRAVENOUS | Status: DC

## 2021-02-10 MED ORDER — INSULIN ASPART 100 UNIT/ML IJ SOLN: 0.0000 [IU] | Freq: Four times a day (QID) | INTRAMUSCULAR | Status: DC

## 2021-02-10 MED ORDER — SODIUM CHLORIDE 0.9 % IV BOLUS
2000.0000 mL | Freq: Once | INTRAVENOUS | Status: AC
  Administered 2021-02-10: 2000 mL via INTRAVENOUS

## 2021-02-10 MED ORDER — METRONIDAZOLE 500 MG/100ML IV SOLN
500.0000 mg | Freq: Three times a day (TID) | INTRAVENOUS | Status: DC
  Administered 2021-02-10 – 2021-02-13 (×8): 500 mg via INTRAVENOUS
  Filled 2021-02-10 (×8): qty 100

## 2021-02-10 MED ORDER — SODIUM CHLORIDE 0.9 % IV SOLN
2.0000 g | Freq: Every day | INTRAVENOUS | Status: DC
  Administered 2021-02-10 – 2021-02-13 (×4): 2 g via INTRAVENOUS
  Filled 2021-02-10 (×3): qty 2

## 2021-02-10 MED ORDER — LEVOTHYROXINE SODIUM 50 MCG PO TABS
50.0000 ug | ORAL_TABLET | Freq: Every day | ORAL | Status: DC
  Administered 2021-02-12 – 2021-02-13 (×2): 50 ug via ORAL
  Filled 2021-02-10: qty 2
  Filled 2021-02-10: qty 1

## 2021-02-10 MED ORDER — VANCOMYCIN VARIABLE DOSE PER UNSTABLE RENAL FUNCTION (PHARMACIST DOSING): Status: DC

## 2021-02-10 MED ORDER — CHLORHEXIDINE GLUCONATE CLOTH 2 % EX PADS
6.0000 | MEDICATED_PAD | Freq: Every day | CUTANEOUS | Status: DC
  Administered 2021-02-11 – 2021-02-13 (×3): 6 via TOPICAL

## 2021-02-10 MED ORDER — ONDANSETRON HCL 4 MG/2ML IJ SOLN: 4.0000 mg | Freq: Four times a day (QID) | INTRAMUSCULAR | Status: DC | PRN

## 2021-02-10 MED ORDER — SODIUM CHLORIDE 0.9 % IV SOLN: 1.0000 g | Freq: Once | INTRAVENOUS | Status: DC

## 2021-02-10 MED ORDER — ONDANSETRON HCL 4 MG PO TABS: 4.0000 mg | ORAL_TABLET | Freq: Four times a day (QID) | ORAL | Status: DC | PRN

## 2021-02-10 MED ORDER — LACTATED RINGERS IV SOLN: INTRAVENOUS | Status: DC

## 2021-02-10 NOTE — Progress Notes (Signed)
[facility] following for Sepsis Protocol 

## 2021-02-10 NOTE — ED Notes (Signed)
Blood cultures obtained before administering antibiotics

## 2021-02-10 NOTE — ED Triage Notes (Signed)
Pt from [facility]. Staff reports pt was hypotensive and diaphoretic. Pt responding to pain at this time.

## 2021-02-10 NOTE — Progress Notes (Signed)
Pharmacy Antibiotic Note  Robert Hurst is a 87 y.o. male admitted on 02/10/2021 with sepsis.  Pharmacy has been consulted for Vancomycin and Cefepime dosing.  SCr up to 1.91, (appears SCr usually ~1.4)  Plan: Cefepime 2gm IV q24h Vancomycin 1000mg  IV now  F/u SCr in a.m. for further Vanc dosing Will f/u micro data and pt's clinical condition Vanc levels prn   Height: 5\' 5"  (165.1 cm) Weight: 53.2 kg (117 lb 4.6 oz) IBW/kg (Calculated) : 61.5  Temp (24hrs), Avg:96.3 F (35.7 C), Min:96.3 F (35.7 C), Max:96.3 F (35.7 C)  Recent Labs  Lab 02/10/21 1913 02/10/21 2026  WBC 8.3  --   CREATININE 1.91*  --   LATICACIDVEN  --  5.4*    Estimated Creatinine Clearance: 20.9 mL/min (A) (by C-G formula based on SCr of 1.91 mg/dL (H)).    Allergies  Allergen Reactions  . Penicillins Other (See Comments)    Unknown reaction    Antimicrobials this admission: 5/21 Rocephin x1 5/21 Cefepime >> 5/21 Vanc >>    Microbiology results: 5/21 BCx:  5/21 UCx:    Thank you for allowing pharmacy to be a part of this patient's care.  6/21, PharmD, BCPS Please see amion for complete clinical pharmacist phone list 02/10/2021 10:12 PM

## 2021-02-10 NOTE — ED Provider Notes (Addendum)
[facility] EMERGENCY DEPARTMENT Provider Note   CSN: [id number] Arrival date & time: 02/10/21  1831     History Chief Complaint  Patient presents with   Weakness    Robert Hurst is a 87 y.o. male.  Patient has dementia and he is a DNR patient.  He was found by the staff at the nursing home more confused than normal and diaphoretic.  The history is provided by the nursing home and medical records. No language interpreter was used.  Weakness Severity:  Severe Onset quality:  Sudden Duration:  2 hours Timing:  Constant Progression:  Worsening Chronicity:  Recurrent Context: not alcohol use   Relieved by:  Nothing Worsened by:  Nothing Ineffective treatments:  None tried     Past Medical History:  Diagnosis Date   Alzheimer disease (HCC)    Calculus of bile duct (any) with acute cholecystitis    Cognitive communication deficit    COVID-19    Dementia (HCC)    DVT (deep venous thrombosis) (HCC)    Dysphagia    Malnourished (HCC)    Metabolic encephalopathy    Protein calorie malnutrition (HCC)    Renal disorder    Syncope and collapse    Thyroid disease     Patient Active Problem List   Diagnosis Date Noted   COVID-19    Advanced care planning/counseling discussion    Palliative care by specialist    Sepsis due to undetermined organism (HCC) 05/22/2020   Hypotension due to hypovolemia    Thrombocytopenia (HCC) 05/03/2020   Sepsis due to Enterococcus (HCC) 05/03/2020   Acute on chronic renal insufficiency    UTI (urinary tract infection) 04/30/2020   CKD (chronic kidney disease), stage III (HCC) 02/12/2020   Hypothyroidism 02/12/2020   History of DVT (deep vein thrombosis)    History of COVID-19    Acute cystitis without hematuria    Goals of care, counseling/discussion    Palliative care encounter    Acute metabolic encephalopathy 08/11/2019   Syncope and collapse 08/11/2019   Syncope    Dementia without behavioral disturbance (HCC) 08/08/2019    Acute renal failure superimposed on stage 3b chronic kidney disease (HCC) 08/08/2019   Lactic acidosis 08/08/2019   Incontinence 08/08/2019    Past Surgical History:  Procedure Laterality Date   APPENDECTOMY     BALLOON DILATION N/A 01/15/2018   Procedure: BALLOON DILATION;  Surgeon: Malissa Hippoehman, Najeeb U, MD;  [facility];  Service: Endoscopy;  Laterality: N/A;   ERCP N/A 01/15/2018   Procedure: ENDOSCOPIC RETROGRADE CHOLANGIOPANCREATOGRAPHY (ERCP);  Surgeon: Malissa Hippoehman, Najeeb U, MD;  [facility];  Service: Endoscopy;  Laterality: N/A;   REMOVAL OF STONES N/A 01/15/2018   Procedure: REMOVAL OF STONES;  Surgeon: Malissa Hippoehman, Najeeb U, MD;  [facility];  Service: Endoscopy;  Laterality: N/A;   SPHINCTEROTOMY N/A 01/15/2018   Procedure: SPHINCTEROTOMY;  Surgeon: Malissa Hippoehman, Najeeb U, MD;  [facility];  Service: Endoscopy;  Laterality: N/A;   TONSILLECTOMY         No family history on file.  Social History   Tobacco Use   Smoking status: Never Smoker   Smokeless tobacco: Never Used  Vaping Use   Vaping Use: Unknown  Substance Use Topics   Alcohol use: Not Currently   Drug use: Not Currently    Home Medications Prior to Admission medications   Medication Sig Start Date End Date Taking? Authorizing Provider  acetaminophen (TYLENOL) 325 MG tablet Take 2 tablets (  650 mg total) by mouth every 6 (six) hours as needed for mild pain, fever or headache (fever >/= 101). 05/26/20   Johnson, Clanford L, MD  apixaban (ELIQUIS) 2.5 MG TABS tablet Take 1 tablet (2.5 mg total) by mouth 2 (two) times daily. 08/17/19 04/30/21  Sherryll Burger, Pratik D, DO  levothyroxine (SYNTHROID) 50 MCG tablet Take 1 tablet (50 mcg total) by mouth daily at 6 (six) AM. 05/05/20   Tat, Onalee Hua, MD  memantine (NAMENDA) 10 MG tablet Take 10 mg by mouth 2 (two) times daily.    [provider]  polyethylene glycol (MIRALAX / GLYCOLAX) 17 g packet Take 17 g by mouth daily.    [provider]  senna (SENOKOT) 8.6 MG tablet Take 1 tablet by mouth daily.    [provider]  Water For Irrigation, Sterile (FREE WATER) SOLN Take 100 mLs by mouth in the morning, at noon, in the evening, and at bedtime. 05/26/20   Cleora Fleet, MD    Allergies    Penicillins  Review of Systems   Review of Systems  Unable to perform ROS: Dementia   Physical Exam Updated Vital Signs BP 95/84   Pulse (!) 55   Temp (!) 96.3 F (35.7 C)   Resp 12   Wt 53.2 kg   SpO2 98%   BMI 20.13 kg/m   Physical Exam Vitals and nursing note reviewed.  Constitutional:      Appearance: He is well-developed.     Comments: Lethargic  HENT:     Head: Normocephalic.     Mouth/Throat:     Mouth: Mucous membranes are dry.  Eyes:     General: No scleral icterus.    Conjunctiva/sclera: Conjunctivae normal.  Neck:     Thyroid: No thyromegaly.  Cardiovascular:     Rate and Rhythm: Regular rhythm. Tachycardia present.     Heart sounds: No murmur heard.   No friction rub. No gallop.  Pulmonary:     Breath sounds: No stridor. No wheezing or rales.  Chest:     Chest wall: No tenderness.  Abdominal:     General: There is no distension.     Tenderness: There is no abdominal tenderness. There is no rebound.  Musculoskeletal:        General: No deformity.     Cervical back: Neck supple.  Lymphadenopathy:     Cervical: No cervical adenopathy.  Skin:    Findings: No erythema or rash.  Neurological:     Motor: No abnormal muscle tone.     Coordination: Coordination normal.     Comments: Patient only responding to painful stimuli good gag reflex    ED Results / Procedures / Treatments   Labs (all labs ordered are listed, but only abnormal results are displayed) Labs Reviewed  LACTIC ACID, PLASMA - Abnormal; Notable for the following components:      Result Value   Lactic Acid, Venous 5.4 (*)    All other components within normal limits  COMPREHENSIVE METABOLIC PANEL - Abnormal; Notable  for the following components:   Glucose, Bld 188 (*)    Creatinine, Ser 1.91 (*)    Calcium 10.5 (*)    Total Protein 9.0 (*)    GFR, Estimated 34 (*)    All other components within normal limits  CBC WITH DIFFERENTIAL/PLATELET - Abnormal; Notable for the following components:   RBC 6.28 (*)    Hemoglobin 18.4 (*)    HCT 56.6 (*)  RDW 15.9 (*)    All other components within normal limits  URINALYSIS, ROUTINE W REFLEX MICROSCOPIC - Abnormal; Notable for the following components:   Hgb urine dipstick MODERATE (*)    Ketones, ur 5 (*)    Protein, ur 100 (*)    RBC / HPF >50 (*)    All other components within normal limits  RESP PANEL BY RT-PCR (FLU A&B, COVID) ARPGX2  CULTURE, BLOOD (ROUTINE X 2)  CULTURE, BLOOD (ROUTINE X 2)  URINE CULTURE  PROTIME-INR  APTT  LACTIC ACID, PLASMA    EKG None  Radiology CT Head Wo Contrast  Result Date: 02/10/2021 CLINICAL DATA:  Status post fall. EXAM: CT HEAD WITHOUT CONTRAST TECHNIQUE: Contiguous axial images were obtained from the base of the skull through the vertex without intravenous contrast. COMPARISON:  October 06, 2020 FINDINGS: Brain: There is mild to moderate severity cerebral atrophy with widening of the extra-axial spaces and ventricular dilatation. There are areas of decreased attenuation within the white matter tracts of the supratentorial brain, consistent with microvascular disease changes. A small chronic right basal ganglia lacunar infarct is seen. Vascular: No hyperdense vessel or unexpected calcification. Skull: Normal. Negative for fracture or focal lesion. Sinuses/Orbits: No acute finding. Other: A small right frontal scalp soft tissue defect is noted. IMPRESSION: 1. Generalized cerebral atrophy. 2. No acute intracranial abnormality. Electronically Signed   By: Aram Candela M.D.   On: 02/10/2021 19:59   DG Chest Port 1 View  Result Date: 02/10/2021 CLINICAL DATA:  Hypotension, diaphoretic EXAM: PORTABLE CHEST 1 VIEW  COMPARISON:  05/22/2020 FINDINGS: Stable cardiomediastinal contours. Low lung volumes with streaky bibasilar opacities. No pleural effusion or pneumothorax. Osseous structures are demineralized. Degenerative changes of the shoulders. IMPRESSION: Low lung volumes with streaky bibasilar opacities, favor atelectasis. Electronically Signed   By: Duanne Guess D.O.   On: 02/10/2021 19:47    Procedures Procedures   Medications Ordered in ED Medications  sodium chloride 0.9 % bolus 2,000 mL (2,000 mLs Intravenous New Bag/Given 02/10/21 2011)  cefTRIAXone (ROCEPHIN) 1 g in sodium chloride 0.9 % 100 mL IVPB (0 g Intravenous Stopped 02/10/21 2053)    ED Course  I have reviewed the triage vital signs and the nursing notes.  Pertinent labs & imaging results that were available during my care of the patient were reviewed by me and considered in my medical decision making (see chart for details). CRITICAL CARE Performed by: Bethann Berkshire Total critical care time: 35 minutes Critical care time was exclusive of separately billable procedures and treating other patients. Critical care was necessary to treat or prevent imminent or life-threatening deterioration. Critical care was time spent personally by me on the following activities: development of treatment plan with patient and/or surrogate as well as nursing, discussions with consultants, evaluation of patient's response to treatment, examination of patient, obtaining history from patient or surrogate, ordering and performing treatments and interventions, ordering and review of laboratory studies, ordering and review of radiographic studies, pulse oximetry and re-evaluation of patient's condition.   Patient with altered mental status and possible sepsis.  He will be admitted to medicine MDM Rules/Calculators/A&P                         Altered mental status secondary to dementia and possible sepsis.  He is admitted to medicine for IV antibiotics and  further work-up  Final Clinical Impression(s) / ED Diagnoses Final diagnoses:  Altered mental status, unspecified altered mental status  type    Rx / DC Orders ED Discharge Orders     None        Bethann Berkshire, MD 02/11/21 1125    Bethann Berkshire, MD 04/17/21 1721

## 2021-02-10 NOTE — ED Notes (Signed)
Patient transported to CT 

## 2021-02-10 NOTE — H&P (Signed)
History and Physical  Prathik Aman OIB:[id number] DOB: 12/23/1931 DOA: 02/10/2021  Referring physician: Dr Aileen Pilot, ED physician PCP: [name], MD  Outpatient Specialists:   Patient Coming From: [facility]  Chief Complaint: decreased responsiveness  HPI: Robert Hurst is a 87 y.o. male with a history of left Alzheimer's dementia, hypothyroidism, malnutrition.  Patient seen for decreased responsiveness.  Patient unable to provide history.  He is a resident of [facility] and was less talkative and less responsive than he normally is.  He was sent to the hospital for evaluation.  Emergency Department Course: Lactic acid 5.4.  White count normal at 8.  Creatinine 1.91.  Chest x-ray significant for atelectasis.  No infiltrates.  UA shows hematuria but no leukocytes or bacteria on micro.  Blood cultures obtained.  Rocephin started  Review of Systems:  Not able to obtain  Past Medical History:  Diagnosis Date  . Alzheimer disease (HCC)   . Calculus of bile duct (any) with acute cholecystitis   . Cognitive communication deficit   . COVID-19   . Dementia (HCC)   . DVT (deep venous thrombosis) (HCC)   . Dysphagia   . Malnourished (HCC)   . Metabolic encephalopathy   . Protein calorie malnutrition (HCC)   . Renal disorder   . Syncope and collapse   . Thyroid disease    Past Surgical History:  Procedure Laterality Date  . APPENDECTOMY    . BALLOON DILATION N/A 01/15/2018   Procedure: BALLOON DILATION;  Surgeon: Malissa Hippo, MD;  Location: [facility];  Service: Endoscopy;  Laterality: N/A;  . ERCP N/A 01/15/2018   Procedure: ENDOSCOPIC RETROGRADE CHOLANGIOPANCREATOGRAPHY (ERCP);  Surgeon: Malissa Hippo, MD;  Location: [facility];  Service: Endoscopy;  Laterality: N/A;  . REMOVAL OF STONES N/A 01/15/2018   Procedure: REMOVAL OF STONES;  Surgeon: Malissa Hippo, MD;  Location: [facility];  Service: Endoscopy;  Laterality: N/A;  . SPHINCTEROTOMY  N/A 01/15/2018   Procedure: SPHINCTEROTOMY;  Surgeon: Malissa Hippo, MD;  Location: [facility];  Service: Endoscopy;  Laterality: N/A;  . TONSILLECTOMY     Social History:  reports that he has never smoked. He has never used smokeless tobacco. He reports previous alcohol use. He reports previous drug use. Patient lives at [facility]  Allergies  Allergen Reactions  . Penicillins Other (See Comments)    Unknown reaction    No family history on file.    Prior to Admission medications   Medication Sig Start Date End Date Taking? Authorizing Provider  acetaminophen (TYLENOL) 325 MG tablet Take 2 tablets (650 mg total) by mouth every 6 (six) hours as needed for mild pain, fever or headache (fever >/= 101). 05/26/20   Johnson, Clanford L, MD  apixaban (ELIQUIS) 2.5 MG TABS tablet Take 1 tablet (2.5 mg total) by mouth 2 (two) times daily. 08/17/19 04/30/21  Sherryll Burger, Pratik D, DO  levothyroxine (SYNTHROID) 50 MCG tablet Take 1 tablet (50 mcg total) by mouth daily at 6 (six) AM. 05/05/20   Tat, Onalee Hua, MD  memantine (NAMENDA) 10 MG tablet Take 10 mg by mouth 2 (two) times daily.    [provider]  polyethylene glycol (MIRALAX / GLYCOLAX) 17 g packet Take 17 g by mouth daily.    [provider]  senna (SENOKOT) 8.6 MG tablet Take 1 tablet by mouth daily.    [provider]  Water For Irrigation, Sterile (FREE WATER) SOLN Take 100  mLs by mouth in the morning, at noon, in the evening, and at bedtime. 05/26/20   Cleora Fleet, MD    Physical Exam: BP 117/85   Pulse 70   Temp (!) 96.3 F (35.7 C)   Resp 19   Wt 53.2 kg   SpO2 100%   BMI 20.13 kg/m   . General: Elderly male cachectic in appearance.  Mild somnolence, but responsive to movements and touch. No acute cardiopulmonary distress.  Marland Kitchen HEENT: Normocephalic atraumatic.  Right and left ears normal in appearance.  Pupils equal, round, reactive to light. Extraocular muscles are intact. Sclerae anicteric  and noninjected.  Moist mucosal membranes. No mucosal lesions.  . Neck: Neck supple without lymphadenopathy. No carotid bruits. No masses palpated.  . Cardiovascular: Regular rate with normal S1-S2 sounds. No murmurs, rubs, gallops auscultated. No JVD.  Marland Kitchen Respiratory: Good respiratory effort with no wheezes, rales, rhonchi. Lungs clear to auscultation bilaterally.  No accessory muscle use. . Abdomen: Soft, nontender, nondistended. Active bowel sounds. No masses or hepatosplenomegaly  . Skin: No rashes, lesions, or ulcerations.  Dry, warm to touch. 2+ dorsalis pedis and radial pulses. . Musculoskeletal: No calf or leg pain. All major joints not erythematous nontender.  No upper or lower joint deformation.  Good ROM.  No contractures  . Psychiatric: Able to determine Neurologic: No focal neurological deficits, but unable to fully examine due to patient somnolence          Labs on Admission: I have personally reviewed following labs and imaging studies  CBC: Recent Labs  Lab 02/10/21 1913  WBC 8.3  NEUTROABS 6.1  HGB 18.4*  HCT 56.6*  MCV 90.1  PLT 186   Basic Metabolic Panel: Recent Labs  Lab 02/10/21 1913  NA 144  K 4.5  CL 106  CO2 27  GLUCOSE 188*  BUN 19  CREATININE 1.91*  CALCIUM 10.5*   GFR: CrCl cannot be calculated (Unknown ideal weight.). Liver Function Tests: Recent Labs  Lab 02/10/21 1913  AST 31  ALT 15  ALKPHOS 106  BILITOT 0.8  PROT 9.0*  ALBUMIN 4.4   No results for input(s): LIPASE, AMYLASE in the last 168 hours. No results for input(s): AMMONIA in the last 168 hours. Coagulation Profile: Recent Labs  Lab 02/10/21 2027  INR 1.1   Cardiac Enzymes: No results for input(s): CKTOTAL, CKMB, CKMBINDEX, TROPONINI in the last 168 hours. BNP (last 3 results) No results for input(s): PROBNP in the last 8760 hours. HbA1C: No results for input(s): HGBA1C in the last 72 hours. CBG: No results for input(s): GLUCAP in the last 168 hours. Lipid  Profile: No results for input(s): CHOL, HDL, LDLCALC, TRIG, CHOLHDL, LDLDIRECT in the last 72 hours. Thyroid Function Tests: No results for input(s): TSH, T4TOTAL, FREET4, T3FREE, THYROIDAB in the last 72 hours. Anemia Panel: No results for input(s): VITAMINB12, FOLATE, FERRITIN, TIBC, IRON, RETICCTPCT in the last 72 hours. Urine analysis:    Component Value Date/Time   COLORURINE YELLOW 02/10/2021 1930   APPEARANCEUR CLEAR 02/10/2021 1930   LABSPEC 1.011 02/10/2021 1930   PHURINE 6.0 02/10/2021 1930   GLUCOSEU NEGATIVE 02/10/2021 1930   HGBUR MODERATE (A) 02/10/2021 1930   BILIRUBINUR NEGATIVE 02/10/2021 1930   KETONESUR 5 (A) 02/10/2021 1930   PROTEINUR 100 (A) 02/10/2021 1930   NITRITE NEGATIVE 02/10/2021 1930   LEUKOCYTESUR NEGATIVE 02/10/2021 1930   Sepsis Labs: @LABRCNTIP (procalcitonin:4,lacticidven:4) )No results found for this or any previous visit (from the past 240 hour(s)).   Radiological  Exams on Admission: CT Head Wo Contrast  Result Date: 02/10/2021 CLINICAL DATA:  Status post fall. EXAM: CT HEAD WITHOUT CONTRAST TECHNIQUE: Contiguous axial images were obtained from the base of the skull through the vertex without intravenous contrast. COMPARISON:  October 06, 2020 FINDINGS: Brain: There is mild to moderate severity cerebral atrophy with widening of the extra-axial spaces and ventricular dilatation. There are areas of decreased attenuation within the white matter tracts of the supratentorial brain, consistent with microvascular disease changes. A small chronic right basal ganglia lacunar infarct is seen. Vascular: No hyperdense vessel or unexpected calcification. Skull: Normal. Negative for fracture or focal lesion. Sinuses/Orbits: No acute finding. Other: A small right frontal scalp soft tissue defect is noted. IMPRESSION: 1. Generalized cerebral atrophy. 2. No acute intracranial abnormality. Electronically Signed   By: Aram Candela M.D.   On: 02/10/2021 19:59   DG  Chest Port 1 View  Result Date: 02/10/2021 CLINICAL DATA:  Hypotension, diaphoretic EXAM: PORTABLE CHEST 1 VIEW COMPARISON:  05/22/2020 FINDINGS: Stable cardiomediastinal contours. Low lung volumes with streaky bibasilar opacities. No pleural effusion or pneumothorax. Osseous structures are demineralized. Degenerative changes of the shoulders. IMPRESSION: Low lung volumes with streaky bibasilar opacities, favor atelectasis. Electronically Signed   By: Duanne Guess D.O.   On: 02/10/2021 19:47    EKG: Independently reviewed.  Sinus bradycardia, left atrial enlargement.  Early R wave progression.  No acute ST changes.  Assessment/Plan: Active Problems:   Dementia without behavioral disturbance (HCC)   Acute renal failure superimposed on stage 3b chronic kidney disease (HCC)   Lactic acidosis   Hypothyroidism   History of DVT (deep vein thrombosis)   Severe sepsis [facility])    This patient was discussed with the ED physician, including pertinent vitals, physical exam findings, labs, and imaging.  We also discussed care given by the ED provider.  1. Severe sepsis with unknown source with lactic acidosis and metabolic encephalopathy a. Admit to stepdown b. Blood cultures and urine cultures pending c. We will add vancomycin, Flagyl, switch Rocephin to cefepime d. Procalcitonin now in the morning e. Repeat lactic acid f. CBC in the morning g. IV fluids 2. Acute renal failure superimposed on stage III chronic kidney disease a. Repeat creatinine in the morning 3. Hypothyroidism a. Continue Synthroid 4. History of DVT a. As patient fairly somnolent, change Eliquis to Lovenox  5. Alzheimer's  DVT prophylaxis: Lovenox full dose Consultants: None Code Status: DNR Family Communication: I did talk with patient's daughter confirms DNR status. Disposition Plan: Patient will return to [facility] home following stabilization   Levie Heritage, DO

## 2021-02-10 NOTE — ED Notes (Signed)
Date and time results received: 02/10/21 9:00 PM  Test: Lactic Acid Critical Value: 5.4  Name of Provider Notified: Dr. Estell Harpin  Orders Received? Or Actions Taken?:

## 2021-02-11 ENCOUNTER — Encounter (HOSPITAL_COMMUNITY): Payer: Self-pay | Admitting: Family Medicine

## 2021-02-11 DIAGNOSIS — A419 Sepsis, unspecified organism: Secondary | ICD-10-CM | POA: Diagnosis not present

## 2021-02-11 DIAGNOSIS — E039 Hypothyroidism, unspecified: Secondary | ICD-10-CM | POA: Diagnosis not present

## 2021-02-11 DIAGNOSIS — Z86718 Personal history of other venous thrombosis and embolism: Secondary | ICD-10-CM | POA: Diagnosis not present

## 2021-02-11 DIAGNOSIS — N179 Acute kidney failure, unspecified: Secondary | ICD-10-CM | POA: Diagnosis not present

## 2021-02-11 LAB — COMPREHENSIVE METABOLIC PANEL
ALT: 10 U/L (ref 0–44)
AST: 23 U/L (ref 15–41)
Albumin: 3.1 g/dL — ABNORMAL LOW (ref 3.5–5.0)
Alkaline Phosphatase: 69 U/L (ref 38–126)
Anion gap: 8 (ref 5–15)
BUN: 16 mg/dL (ref 8–23)
CO2: 23 mmol/L (ref 22–32)
Calcium: 8.6 mg/dL — ABNORMAL LOW (ref 8.9–10.3)
Chloride: 113 mmol/L — ABNORMAL HIGH (ref 98–111)
Creatinine, Ser: 1.43 mg/dL — ABNORMAL HIGH (ref 0.61–1.24)
GFR, Estimated: 48 mL/min — ABNORMAL LOW (ref >60)
Glucose, Bld: 93 mg/dL (ref 70–99)
Potassium: 4.3 mmol/L (ref 3.5–5.1)
Sodium: 144 mmol/L (ref 135–145)
Total Bilirubin: 0.6 mg/dL (ref 0.3–1.2)
Total Protein: 6.2 g/dL — ABNORMAL LOW (ref 6.5–8.1)

## 2021-02-11 LAB — CBC
HCT: 43.2 % (ref 39.0–52.0)
Hemoglobin: 14.5 g/dL (ref 13.0–17.0)
MCH: 30.1 pg (ref 26.0–34.0)
MCHC: 33.6 g/dL (ref 30.0–36.0)
MCV: 89.8 fL (ref 80.0–100.0)
Platelets: 126 10*3/uL — ABNORMAL LOW (ref 150–400)
RBC: 4.81 MIL/uL (ref 4.22–5.81)
RDW: 14.9 % (ref 11.5–15.5)
WBC: 6.6 10*3/uL (ref 4.0–10.5)
nRBC: 0 % (ref 0.0–0.2)

## 2021-02-11 LAB — GLUCOSE, CAPILLARY
Glucose-Capillary: 95 mg/dL (ref 70–99)
Glucose-Capillary: 95 mg/dL (ref 70–99)
Glucose-Capillary: 66 mg/dL — ABNORMAL LOW (ref 70–99)
Glucose-Capillary: 113 mg/dL — ABNORMAL HIGH (ref 70–99)

## 2021-02-11 LAB — PROCALCITONIN
Procalcitonin: <0.1 ng/mL
Procalcitonin: <0.1 ng/mL

## 2021-02-11 LAB — HEMOGLOBIN A1C
Hgb A1c MFr Bld: 6.2 % — ABNORMAL HIGH (ref 4.8–5.6)
Mean Plasma Glucose: 131.24 mg/dL

## 2021-02-11 LAB — PROTIME-INR
INR: 1.3 — ABNORMAL HIGH (ref 0.8–1.2)
Prothrombin Time: 15.7 seconds — ABNORMAL HIGH (ref 11.4–15.2)

## 2021-02-11 LAB — MRSA PCR SCREENING: MRSA by PCR: NEGATIVE

## 2021-02-11 LAB — TSH: TSH: 9.989 u[IU]/mL — ABNORMAL HIGH (ref 0.350–4.500)

## 2021-02-11 LAB — LACTIC ACID, PLASMA: Lactic Acid, Venous: 1.9 mmol/L (ref 0.5–1.9)

## 2021-02-11 LAB — CORTISOL-AM, BLOOD: Cortisol - AM: 12.8 ug/dL (ref 6.7–22.6)

## 2021-02-11 MED ORDER — ENOXAPARIN SODIUM 60 MG/0.6ML IJ SOSY: 55.0000 mg | PREFILLED_SYRINGE | INTRAMUSCULAR | Status: DC

## 2021-02-11 MED ORDER — APIXABAN 2.5 MG PO TABS
2.5000 mg | ORAL_TABLET | Freq: Two times a day (BID) | ORAL | Status: DC
  Administered 2021-02-11 – 2021-02-13 (×4): 2.5 mg via ORAL
  Filled 2021-02-11 (×4): qty 1

## 2021-02-11 MED ORDER — HYDRALAZINE HCL 20 MG/ML IJ SOLN: 10.0000 mg | Freq: Once | INTRAMUSCULAR | Status: DC

## 2021-02-11 NOTE — Progress Notes (Signed)
Additional lactic being ordered since 2nd Lactic of 4.0

## 2021-02-11 NOTE — Progress Notes (Signed)
Hypoglycemic Event  CBG: 66  Treatment: 4 oz juice/soda & 4 oz of vanilla ice cream per pt request.  Symptoms: None  Follow-up CBG: Time:1808 CBG Result:113  Possible Reasons for Event: Inadequate meal intake  Comments/MD notified:Courage, MD.    [name]

## 2021-02-11 NOTE — Progress Notes (Signed)
PHARMACIST - PHYSICIAN COMMUNICATION  CONCERNING:  Enoxaparin (Lovenox) for DVT Prophylaxis   RECOMMENDATION: Patient was prescribed enoxaprin 1mg /Kg q12 hours for VTE prophylaxis.   Filed Weights   02/10/21 1934 02/10/21 2331  Weight: 53.2 kg (117 lb 4.6 oz) 53 kg (116 lb 13.5 oz)    Body mass index is 19.44 kg/m.  Estimated Creatinine Clearance: 27.8 mL/min (A) (by C-G formula based on SCr of 1.43 mg/dL (H)).   Patient is candidate for enoxaparin 1mg /kg every 24 hours based on CrCl <11ml/min   DESCRIPTION: Pharmacy has adjusted enoxaparin dose per [facility] policy.  Patient is now receiving enoxaparin 55 mg every 24 hours  Monitor Platelets  31m, PharmD Clinical Pharmacist  02/11/2021 9:26 AM

## 2021-02-11 NOTE — Progress Notes (Signed)
Patient Demographics:    Robert Hurst, is a 87 y.o. male, DOB - 1932-02-07, XFG:[id number]  Admit date - 02/10/2021   Admitting Physician Levie Heritage, DO  Outpatient Primary MD for the patient is [name], MD  LOS - 1   Chief Complaint  Patient presents with  . Weakness        Subjective:    [name] today has no fevers, no emesis,  No chest pain,  More awake, tolerating oral intake -Patient's daughter [name] at bedside, questions answered  Assessment  & Plan :    Active Problems:   Dementia without behavioral disturbance (HCC)   Acute renal failure superimposed on stage 3b chronic kidney disease (HCC)   Lactic acidosis   Hypothyroidism   History of DVT (deep vein thrombosis)   Severe sepsis (HCC)  Brief Summary:- 87 y.o. male with a history of left Alzheimer's dementia, hypothyroidism, malnutrition admitted from [facility] with concerns for dehydration and altered mentation, possible sepsis  A/p 1) acute metabolic encephalopathy--- according to patient's daughter patient is getting close to baseline but not quite back to baseline  2) lactic acidosis--need to rule out infection -Lactic acidosis normalized with IV fluids -Continue IV Antibiotics pending culture data  3)H/o DVT --okay to restart Eliquis  4)AKI----acute kidney injury on CKD stage - 3A   creatinine on admission= 1.91  , baseline creatinine =  1.3 to 1.5   ,  -creatinine is now= 1.43  ,  renally adjust medications, avoid nephrotoxic agents / dehydration  / hypotension   5) hypothyroidism--continue levothyroxine  6) dementia --continue to hold Namenda for now   Disposition/Need for in-Hospital Stay- patient unable to be discharged at this time due to --AKI, dehydration and lactic acidosis with concerns for possible infection requiring IV antibiotics and IV fluids pending further culture data*  Status is:  Inpatient  Remains inpatient appropriate because:Please see disposition above   Disposition: The patient is from: SNF              Anticipated d/c is to: SNF              Anticipated d/c date is: 2 days              Patient currently is not medically stable to d/c. Barriers: Not Clinically Stable-   Code Status :  -  Code Status: DNR   Family Communication:   NA (patient is alert, awake and coherent)   Consults  :  na  DVT Prophylaxis  :   - SCDs /eliquis*      Lab Results  Component Value Date   PLT 126 (L) 02/11/2021    Inpatient Medications  Scheduled Meds: . Chlorhexidine Gluconate Cloth  6 each Topical Daily  . [START ON 02/12/2021] enoxaparin (LOVENOX) injection  55 mg Subcutaneous Q24H  . hydrALAZINE  10 mg Intravenous Once  . insulin aspart  0-9 Units Subcutaneous Q6H  . levothyroxine  50 mcg Oral Q0600  . vancomycin variable dose per unstable renal function (pharmacist dosing)   Does not apply See admin instructions   Continuous Infusions: . ceFEPime (MAXIPIME) IV Stopped (02/10/21 2315)  . lactated ringers 75 mL/hr at 02/11/21 1554  . metronidazole Stopped (02/11/21 1515)  PRN Meds:.ondansetron **OR** ondansetron (ZOFRAN) IV    Anti-infectives (From admission, onward)   Start     Dose/Rate Route Frequency Ordered Stop   02/11/21 2000  cefTRIAXone (ROCEPHIN) 2 g in sodium chloride 0.9 % 100 mL IVPB  Status:  Discontinued        2 g 200 mL/hr over 30 Minutes Intravenous Every 24 hours 02/10/21 2155 02/10/21 2202   02/10/21 2230  vancomycin (VANCOCIN) IVPB 1000 mg/200 mL premix        1,000 mg 200 mL/hr over 60 Minutes Intravenous NOW 02/10/21 2216 02/11/21 0105   02/10/21 2230  ceFEPIme (MAXIPIME) 2 g in sodium chloride 0.9 % 100 mL IVPB        2 g 200 mL/hr over 30 Minutes Intravenous Daily at bedtime 02/10/21 2216     02/10/21 2217  vancomycin variable dose per unstable renal function (pharmacist dosing)         Does not apply See admin instructions  02/10/21 2217     02/10/21 2200  metroNIDAZOLE (FLAGYL) IVPB 500 mg        500 mg 100 mL/hr over 60 Minutes Intravenous Every 8 hours 02/10/21 2155     02/10/21 2200  cefTRIAXone (ROCEPHIN) 1 g in sodium chloride 0.9 % 100 mL IVPB  Status:  Discontinued        1 g 200 mL/hr over 30 Minutes Intravenous  Once 02/10/21 2155 02/10/21 2202   02/10/21 1930  cefTRIAXone (ROCEPHIN) 1 g in sodium chloride 0.9 % 100 mL IVPB        1 g 200 mL/hr over 30 Minutes Intravenous  Once 02/10/21 1920 02/10/21 2053        Objective:   Vitals:   02/11/21 1500 02/11/21 1600 02/11/21 1630 02/11/21 1700  BP: 137/66 (!) 168/66  (!) 144/73  Pulse: (!) 48 (!) 50 (!) 49 (!) 52  Resp: 10 12 18 15   Temp:   (!) 96.6 F (35.9 C)   TempSrc:   Axillary   SpO2: 99% 98% 100% 95%  Weight:      Height:        Wt Readings from Last 3 Encounters:  02/10/21 53 kg  10/06/20 53.2 kg  05/22/20 54.4 kg     Intake/Output Summary (Last 24 hours) at 02/11/2021 1753 Last data filed at 02/11/2021 1700 Gross per 24 hour  Intake 3963.15 ml  Output 1250 ml  Net 2713.15 ml    Physical Exam  Gen:-More awake and more interactive, cachectic appearing  HEENT:- [place].AT, No sclera icterus Neck-Supple Neck,No JVD,.  Lungs-diminished breath sounds, no wheezing  CV- S1, S2 normal, regular  Abd-  +ve B.Sounds, Abd Soft, No tenderness,    Extremity/Skin:- No  edema, pedal pulses present  Psych-affect is less anxious, oriented x2 Neuro-generalized weakness, no new focal deficits, no tremors   Data Review:   Micro Results Recent Results (from the past 240 hour(s))  Resp Panel by RT-PCR (Flu A&B, Covid) Nasopharyngeal Swab     Status: None   Collection Time: 02/10/21  7:19 PM   Specimen: Nasopharyngeal Swab; Nasopharyngeal(NP) swabs in vial transport medium  Result Value Ref Range Status   SARS Coronavirus 2 by RT PCR NEGATIVE NEGATIVE Final    Comment: (NOTE) SARS-CoV-2 target nucleic acids are NOT DETECTED.  The  SARS-CoV-2 RNA is generally detectable in upper respiratory specimens during the acute phase of infection. The lowest concentration of SARS-CoV-2 viral copies this assay can detect is 138 copies/mL. A negative result  does not preclude SARS-Cov-2 infection and should not be used as the sole basis for treatment or other patient management decisions. A negative result may occur with  improper specimen collection/handling, submission of specimen other than nasopharyngeal swab, presence of viral mutation(s) within the areas targeted by this assay, and inadequate number of viral copies(<138 copies/mL). A negative result must be combined with clinical observations, patient history, and epidemiological information. The expected result is Negative.  Fact Sheet for Patients:  BloggerCourse.com  Fact Sheet for Healthcare Providers:  SeriousBroker.it  This test is no t yet approved or cleared by the [place] FDA and  has been authorized for detection and/or diagnosis of SARS-CoV-2 by FDA under an Emergency Use Authorization (EUA). This EUA will remain  in effect (meaning this test can be used) for the duration of the COVID-19 declaration under Section 564(b)(1) of the Act, 21 U.S.C.section 360bbb-3(b)(1), unless the authorization is terminated  or revoked sooner.       Influenza A by PCR NEGATIVE NEGATIVE Final   Influenza B by PCR NEGATIVE NEGATIVE Final    Comment: (NOTE) The Xpert Xpress SARS-CoV-2/FLU/RSV plus assay is intended as an aid in the diagnosis of influenza from Nasopharyngeal swab specimens and should not be used as a sole basis for treatment. Nasal washings and aspirates are unacceptable for Xpert Xpress SARS-CoV-2/FLU/RSV testing.  Fact Sheet for Patients: BloggerCourse.com  Fact Sheet for Healthcare Providers: SeriousBroker.it  This test is not yet approved or  cleared by the [place] FDA and has been authorized for detection and/or diagnosis of SARS-CoV-2 by FDA under an Emergency Use Authorization (EUA). This EUA will remain in effect (meaning this test can be used) for the duration of the COVID-19 declaration under Section 564(b)(1) of the Act, 21 U.S.C. section 360bbb-3(b)(1), unless the authorization is terminated or revoked.  Performed at [facility], [address], [place], [place] [address]   Blood Culture (routine x 2)     Status: None (Preliminary result)   Collection Time: 02/10/21  8:15 PM   Specimen: BLOOD  Result Value Ref Range Status   Specimen Description BLOOD RIGHT ANTECUBITAL  Final   Special Requests AEROBIC BOTTLE ONLY Blood Culture adequate volume  Final   Culture   Final    NO GROWTH < 24 HOURS Performed at [facility], [address], [place], [place] [address]    Report Status PENDING  Incomplete  Blood Culture (routine x 2)     Status: None (Preliminary result)   Collection Time: 02/10/21  8:27 PM   Specimen: BLOOD  Result Value Ref Range Status   Specimen Description BLOOD LEFT ANTECUBITAL  Final   Special Requests AEROBIC BOTTLE ONLY Blood Culture adequate volume  Final   Culture   Final    NO GROWTH < 24 HOURS Performed at [facility], [address], [place], [place] [address]    Report Status PENDING  Incomplete  MRSA PCR Screening     Status: None   Collection Time: 02/10/21 11:12 PM   Specimen: Nasal Mucosa; Nasopharyngeal  Result Value Ref Range Status   MRSA by PCR NEGATIVE NEGATIVE Final    Comment:        The GeneXpert MRSA Assay (FDA approved for NASAL specimens only), is one component of a comprehensive MRSA colonization surveillance program. It is not intended to diagnose MRSA infection nor to guide or monitor treatment for MRSA infections. Performed at [facility], [address], [place], [place] [address]  Radiology Reports CT Head Wo Contrast  Result Date:  02/10/2021 CLINICAL DATA:  Status post fall. EXAM: CT HEAD WITHOUT CONTRAST TECHNIQUE: Contiguous axial images were obtained from the base of the skull through the vertex without intravenous contrast. COMPARISON:  October 06, 2020 FINDINGS: Brain: There is mild to moderate severity cerebral atrophy with widening of the extra-axial spaces and ventricular dilatation. There are areas of decreased attenuation within the white matter tracts of the supratentorial brain, consistent with microvascular disease changes. A small chronic right basal ganglia lacunar infarct is seen. Vascular: No hyperdense vessel or unexpected calcification. Skull: Normal. Negative for fracture or focal lesion. Sinuses/Orbits: No acute finding. Other: A small right frontal scalp soft tissue defect is noted. IMPRESSION: 1. Generalized cerebral atrophy. 2. No acute intracranial abnormality. Electronically Signed   By: Aram Candela M.D.   On: 02/10/2021 19:59   DG Chest Port 1 View  Result Date: 02/10/2021 CLINICAL DATA:  Hypotension, diaphoretic EXAM: PORTABLE CHEST 1 VIEW COMPARISON:  05/22/2020 FINDINGS: Stable cardiomediastinal contours. Low lung volumes with streaky bibasilar opacities. No pleural effusion or pneumothorax. Osseous structures are demineralized. Degenerative changes of the shoulders. IMPRESSION: Low lung volumes with streaky bibasilar opacities, favor atelectasis. Electronically Signed   By: Duanne Guess D.O.   On: 02/10/2021 19:47     CBC Recent Labs  Lab 02/10/21 1913 02/11/21 0457  WBC 8.3 6.6  HGB 18.4* 14.5  HCT 56.6* 43.2  PLT 186 126*  MCV 90.1 89.8  MCH 29.3 30.1  MCHC 32.5 33.6  RDW 15.9* 14.9  LYMPHSABS 1.3  --   MONOABS 0.7  --   EOSABS 0.0  --   BASOSABS 0.1  --     Chemistries  Recent Labs  Lab 02/10/21 1913 02/11/21 0457  NA 144 144  K 4.5 4.3  CL 106 113*  CO2 27 23  GLUCOSE 188* 93  BUN 19 16  CREATININE 1.91* 1.43*  CALCIUM 10.5* 8.6*  AST 31 23  ALT 15 10   ALKPHOS 106 69  BILITOT 0.8 0.6   ------------------------------------------------------------------------------------------------------------------ No results for input(s): CHOL, HDL, LDLCALC, TRIG, CHOLHDL, LDLDIRECT in the last 72 hours.  Lab Results  Component Value Date   HGBA1C 6.2 (H) 02/10/2021   ------------------------------------------------------------------------------------------------------------------ Recent Labs    02/10/21 2245  TSH 9.989*   ------------------------------------------------------------------------------------------------------------------ No results for input(s): VITAMINB12, FOLATE, FERRITIN, TIBC, IRON, RETICCTPCT in the last 72 hours.  Coagulation profile Recent Labs  Lab 02/10/21 2027 02/11/21 0457  INR 1.1 1.3*    No results for input(s): DDIMER in the last 72 hours.  Cardiac Enzymes No results for input(s): CKMB, TROPONINI, MYOGLOBIN in the last 168 hours.  Invalid input(s): CK ------------------------------------------------------------------------------------------------------------------ No results found for: BNP   Shon Hale M.D on 02/11/2021 at 5:53 PM  Go to [email or web address] - for contact info  [facility] - Office  [phone]

## 2021-02-12 DIAGNOSIS — E039 Hypothyroidism, unspecified: Secondary | ICD-10-CM | POA: Diagnosis not present

## 2021-02-12 DIAGNOSIS — F0390 Unspecified dementia without behavioral disturbance: Secondary | ICD-10-CM | POA: Diagnosis not present

## 2021-02-12 DIAGNOSIS — Z86718 Personal history of other venous thrombosis and embolism: Secondary | ICD-10-CM | POA: Diagnosis not present

## 2021-02-12 DIAGNOSIS — N179 Acute kidney failure, unspecified: Secondary | ICD-10-CM | POA: Diagnosis not present

## 2021-02-12 LAB — VANCOMYCIN, RANDOM: Vancomycin Rm: 8

## 2021-02-12 LAB — GLUCOSE, CAPILLARY
Glucose-Capillary: 115 mg/dL — ABNORMAL HIGH (ref 70–99)
Glucose-Capillary: 121 mg/dL — ABNORMAL HIGH (ref 70–99)
Glucose-Capillary: 174 mg/dL — ABNORMAL HIGH (ref 70–99)
Glucose-Capillary: 56 mg/dL — ABNORMAL LOW (ref 70–99)
Glucose-Capillary: 71 mg/dL (ref 70–99)
Glucose-Capillary: 86 mg/dL (ref 70–99)

## 2021-02-12 LAB — CBC
HCT: 42.2 % (ref 39.0–52.0)
Hemoglobin: 14 g/dL (ref 13.0–17.0)
MCH: 29.7 pg (ref 26.0–34.0)
MCHC: 33.2 g/dL (ref 30.0–36.0)
MCV: 89.6 fL (ref 80.0–100.0)
Platelets: 114 10*3/uL — ABNORMAL LOW (ref 150–400)
RBC: 4.71 MIL/uL (ref 4.22–5.81)
RDW: 14.6 % (ref 11.5–15.5)
WBC: 4 10*3/uL (ref 4.0–10.5)
nRBC: 0 % (ref 0.0–0.2)

## 2021-02-12 LAB — BASIC METABOLIC PANEL
Anion gap: 8 (ref 5–15)
BUN: 12 mg/dL (ref 8–23)
CO2: 25 mmol/L (ref 22–32)
Calcium: 8.6 mg/dL — ABNORMAL LOW (ref 8.9–10.3)
Chloride: 110 mmol/L (ref 98–111)
Creatinine, Ser: 1.33 mg/dL — ABNORMAL HIGH (ref 0.61–1.24)
GFR, Estimated: 52 mL/min — ABNORMAL LOW (ref >60)
Glucose, Bld: 68 mg/dL — ABNORMAL LOW (ref 70–99)
Potassium: 3.7 mmol/L (ref 3.5–5.1)
Sodium: 143 mmol/L (ref 135–145)

## 2021-02-12 LAB — AMMONIA: Ammonia: 34 umol/L (ref 9–35)

## 2021-02-12 LAB — URINE CULTURE: Culture: NO GROWTH

## 2021-02-12 LAB — PROCALCITONIN: Procalcitonin: <0.1 ng/mL

## 2021-02-12 MED ORDER — DEXTROSE-NACL 5-0.45 % IV SOLN: INTRAVENOUS | Status: AC

## 2021-02-12 MED ORDER — DEXTROSE-NACL 5-0.45 % IV SOLN: INTRAVENOUS | Status: DC

## 2021-02-12 MED ORDER — VANCOMYCIN HCL IN DEXTROSE 1-5 GM/200ML-% IV SOLN
1000.0000 mg | INTRAVENOUS | Status: DC
  Administered 2021-02-12: 1000 mg via INTRAVENOUS
  Filled 2021-02-12: qty 200

## 2021-02-12 MED ORDER — DEXTROSE 50 % IV SOLN
12.5000 g | INTRAVENOUS | Status: AC
  Administered 2021-02-12: 12.5 g via INTRAVENOUS
  Filled 2021-02-12: qty 50

## 2021-02-12 MED ORDER — ENSURE ENLIVE PO LIQD
237.0000 mL | Freq: Three times a day (TID) | ORAL | Status: DC
  Administered 2021-02-12 (×2): 237 mL via ORAL

## 2021-02-12 NOTE — Progress Notes (Signed)
Report called and given to Harriett Sine, RN on Dept 300. Pt to be transported via bed to room 304.

## 2021-02-12 NOTE — Progress Notes (Signed)
Patient Demographics:    Robert Hurst, is a 87 y.o. male, DOB - 03/20/1932, ZOX:[id number]  Admit date - 02/10/2021   Admitting Physician Levie Heritage, DO  Outpatient Primary MD for the patient is [name], MD  LOS - 2   Chief Complaint  Patient presents with  . Weakness        Subjective:    Robert Hurst today has no fevers, no emesis,  No chest pain,  Oral intake is poor , leading to low blood sugars and lethargy  Assessment  & Plan :    Active Problems:   Dementia without behavioral disturbance (HCC)   Acute renal failure superimposed on stage 3b chronic kidney disease (HCC)   Lactic acidosis   Hypothyroidism   History of DVT (deep vein thrombosis)   Severe sepsis (HCC)  Brief Summary:- 87 y.o. male with a history of left Alzheimer's dementia, hypothyroidism, malnutrition admitted from [facility] with concerns for dehydration and altered mentation, possible sepsis  A/p 1) acute metabolic encephalopathy--- Oral intake is poor , leading to low blood sugars and lethargy -Start IV dextrose -Check serum ammonia levels  2) lactic acidosis--need to rule out infection -Lactic acidosis normalized with IV fluids -will Discontinue antibiotics on 02/13/2021 if cultures remain negative  3)H/o DVT --continue Eliquis  4)AKI----acute kidney injury on CKD stage - 3A   creatinine on admission= 1.91  , baseline creatinine =  1.3 to 1.5   ,  -creatinine is now= 1.3  ,  renally adjust medications, avoid nephrotoxic agents / dehydration  / hypotension   5) hypothyroidism--TSH 9.9 , patient was not compliant with levothyroxine PTA -restarted levothyroxine 50 mcg daily  6) dementia --continue to hold Namenda for now   Disposition/Need for in-Hospital Stay- patient unable to be discharged at this time due to --AKI, dehydration and lactic acidosis with concerns for possible infection requiring IV  antibiotics and IV fluids pending further culture data* -Oral intake is poor , leading to low blood sugars and lethargy  Status is: Inpatient  Remains inpatient appropriate because:Please see disposition above   Disposition: The patient is from: SNF              Anticipated d/c is to: SNF              Anticipated d/c date is: 2 days              Patient currently is not medically stable to d/c. Barriers: Not Clinically Stable-   Code Status :  -  Code Status: DNR   Family Communication:   Discussed with daughter  [name] Consults  :  na  DVT Prophylaxis  :   - SCDs /eliquis*  apixaban (ELIQUIS) tablet 2.5 mg Start: 02/11/21 2200 apixaban (ELIQUIS) tablet 2.5 mg    Lab Results  Component Value Date   PLT 114 (L) 02/12/2021    Inpatient Medications  Scheduled Meds: . apixaban  2.5 mg Oral BID  . Chlorhexidine Gluconate Cloth  6 each Topical Daily  . feeding supplement  237 mL Oral TID  . hydrALAZINE  10 mg Intravenous Once  . insulin aspart  0-9 Units Subcutaneous Q6H  . levothyroxine  50 mcg Oral Q0600   Continuous Infusions: .  ceFEPime (MAXIPIME) IV Stopped (02/11/21 2153)  . dextrose 5 % and 0.45% NaCl 75 mL/hr at 02/12/21 1748  . metronidazole Stopped (02/12/21 1418)  . vancomycin     PRN Meds:.ondansetron **OR** ondansetron (ZOFRAN) IV    Anti-infectives (From admission, onward)   Start     Dose/Rate Route Frequency Ordered Stop   02/12/21 2200  vancomycin (VANCOCIN) IVPB 1000 mg/200 mL premix        1,000 mg 200 mL/hr over 60 Minutes Intravenous Every 48 hours 02/12/21 0804     02/11/21 2000  cefTRIAXone (ROCEPHIN) 2 g in sodium chloride 0.9 % 100 mL IVPB  Status:  Discontinued        2 g 200 mL/hr over 30 Minutes Intravenous Every 24 hours 02/10/21 2155 02/10/21 2202   02/10/21 2230  vancomycin (VANCOCIN) IVPB 1000 mg/200 mL premix        1,000 mg 200 mL/hr over 60 Minutes Intravenous NOW 02/10/21 2216 02/11/21 0105   02/10/21 2230  ceFEPIme (MAXIPIME)  2 g in sodium chloride 0.9 % 100 mL IVPB        2 g 200 mL/hr over 30 Minutes Intravenous Daily at bedtime 02/10/21 2216     02/10/21 2217  vancomycin variable dose per unstable renal function (pharmacist dosing)  Status:  Discontinued         Does not apply See admin instructions 02/10/21 2217 02/12/21 0806   02/10/21 2200  metroNIDAZOLE (FLAGYL) IVPB 500 mg        500 mg 100 mL/hr over 60 Minutes Intravenous Every 8 hours 02/10/21 2155     02/10/21 2200  cefTRIAXone (ROCEPHIN) 1 g in sodium chloride 0.9 % 100 mL IVPB  Status:  Discontinued        1 g 200 mL/hr over 30 Minutes Intravenous  Once 02/10/21 2155 02/10/21 2202   02/10/21 1930  cefTRIAXone (ROCEPHIN) 1 g in sodium chloride 0.9 % 100 mL IVPB        1 g 200 mL/hr over 30 Minutes Intravenous  Once 02/10/21 1920 02/10/21 2053        Objective:   Vitals:   02/12/21 1400 02/12/21 1408 02/12/21 1500 02/12/21 1609  BP: 110/65  (!) 116/51 126/84  Pulse: (!) 37 (!) 40 (!) 44 (!) 50  Resp: (!) 9 14 18 16   Temp:    98.8 F (37.1 C)  TempSrc:    Axillary  SpO2: 100% 100% 100% 97%  Weight:      Height:        Wt Readings from Last 3 Encounters:  02/12/21 55.5 kg  10/06/20 53.2 kg  05/22/20 54.4 kg     Intake/Output Summary (Last 24 hours) at 02/12/2021 1838 Last data filed at 02/12/2021 1539 Gross per 24 hour  Intake 2621.59 ml  Output 1225 ml  Net 1396.59 ml    Physical Exam  Gen:-Sleepy, cachectic appearing  HEENT:- [place].AT, No sclera icterus Neck-Supple Neck,No JVD,.  Lungs-diminished breath sounds, no wheezing  CV- S1, S2 normal, regular  Abd-  +ve B.Sounds, Abd Soft, No tenderness,    Extremity/Skin:- No  edema, pedal pulses present  Psych-affect is flat, lethargic Neuro-generalized weakness, no new focal deficits, no tremors   Data Review:   Micro Results Recent Results (from the past 240 hour(s))  Resp Panel by RT-PCR (Flu A&B, Covid) Nasopharyngeal Swab     Status: None   Collection Time: 02/10/21   7:19 PM   Specimen: Nasopharyngeal Swab; Nasopharyngeal(NP) swabs in vial transport medium  Result Value Ref Range Status   SARS Coronavirus 2 by RT PCR NEGATIVE NEGATIVE Final    Comment: (NOTE) SARS-CoV-2 target nucleic acids are NOT DETECTED.  The SARS-CoV-2 RNA is generally detectable in upper respiratory specimens during the acute phase of infection. The lowest concentration of SARS-CoV-2 viral copies this assay can detect is 138 copies/mL. A negative result does not preclude SARS-Cov-2 infection and should not be used as the sole basis for treatment or other patient management decisions. A negative result may occur with  improper specimen collection/handling, submission of specimen other than nasopharyngeal swab, presence of viral mutation(s) within the areas targeted by this assay, and inadequate number of viral copies(<138 copies/mL). A negative result must be combined with clinical observations, patient history, and epidemiological information. The expected result is Negative.  Fact Sheet for Patients:  BloggerCourse.com  Fact Sheet for Healthcare Providers:  SeriousBroker.it  This test is no t yet approved or cleared by the [place] FDA and  has been authorized for detection and/or diagnosis of SARS-CoV-2 by FDA under an Emergency Use Authorization (EUA). This EUA will remain  in effect (meaning this test can be used) for the duration of the COVID-19 declaration under Section 564(b)(1) of the Act, 21 U.S.C.section 360bbb-3(b)(1), unless the authorization is terminated  or revoked sooner.       Influenza A by PCR NEGATIVE NEGATIVE Final   Influenza B by PCR NEGATIVE NEGATIVE Final    Comment: (NOTE) The Xpert Xpress SARS-CoV-2/FLU/RSV plus assay is intended as an aid in the diagnosis of influenza from Nasopharyngeal swab specimens and should not be used as a sole basis for treatment. Nasal washings and aspirates  are unacceptable for Xpert Xpress SARS-CoV-2/FLU/RSV testing.  Fact Sheet for Patients: BloggerCourse.com  Fact Sheet for Healthcare Providers: SeriousBroker.it  This test is not yet approved or cleared by the [place] FDA and has been authorized for detection and/or diagnosis of SARS-CoV-2 by FDA under an Emergency Use Authorization (EUA). This EUA will remain in effect (meaning this test can be used) for the duration of the COVID-19 declaration under Section 564(b)(1) of the Act, 21 U.S.C. section 360bbb-3(b)(1), unless the authorization is terminated or revoked.  Performed at [facility], [address], [place], [place] [address]   Urine culture     Status: None   Collection Time: 02/10/21  7:30 PM   Specimen: Urine, Catheterized  Result Value Ref Range Status   Specimen Description   Final    URINE, CATHETERIZED Performed at [facility], [address] [place], [place] [address]    Special Requests   Final    NONE Performed at [facility], [address], [place], [place] [address]    Culture   Final    NO GROWTH Performed at [facility], [address], [place], [place] [address]    Report Status 02/12/2021 FINAL  Final  Blood Culture (routine x 2)     Status: None (Preliminary result)   Collection Time: 02/10/21  8:15 PM   Specimen: BLOOD  Result Value Ref Range Status   Specimen Description BLOOD RIGHT ANTECUBITAL  Final   Special Requests AEROBIC BOTTLE ONLY Blood Culture adequate volume  Final   Culture   Final    NO GROWTH 2 DAYS Performed at [facility], [address] [place], [place] [address]    Report Status PENDING  Incomplete  Blood Culture (routine x 2)     Status: None (Preliminary result)   Collection Time:  02/10/21  8:27 PM   Specimen: BLOOD  Result Value Ref Range Status   Specimen Description BLOOD LEFT ANTECUBITAL  Final   Special Requests AEROBIC BOTTLE ONLY Blood Culture  adequate volume  Final   Culture   Final    NO GROWTH 2 DAYS Performed at [facility], [address], [place], [place] [address]    Report Status PENDING  Incomplete  MRSA PCR Screening     Status: None   Collection Time: 02/10/21 11:12 PM   Specimen: Nasal Mucosa; Nasopharyngeal  Result Value Ref Range Status   MRSA by PCR NEGATIVE NEGATIVE Final    Comment:        The GeneXpert MRSA Assay (FDA approved for NASAL specimens only), is one component of a comprehensive MRSA colonization surveillance program. It is not intended to diagnose MRSA infection nor to guide or monitor treatment for MRSA infections. Performed at [facility], [address], [place], [place] [address]     Radiology Reports CT Head Wo Contrast  Result Date: 02/10/2021 CLINICAL DATA:  Status post fall. EXAM: CT HEAD WITHOUT CONTRAST TECHNIQUE: Contiguous axial images were obtained from the base of the skull through the vertex without intravenous contrast. COMPARISON:  October 06, 2020 FINDINGS: Brain: There is mild to moderate severity cerebral atrophy with widening of the extra-axial spaces and ventricular dilatation. There are areas of decreased attenuation within the white matter tracts of the supratentorial brain, consistent with microvascular disease changes. A small chronic right basal ganglia lacunar infarct is seen. Vascular: No hyperdense vessel or unexpected calcification. Skull: Normal. Negative for fracture or focal lesion. Sinuses/Orbits: No acute finding. Other: A small right frontal scalp soft tissue defect is noted. IMPRESSION: 1. Generalized cerebral atrophy. 2. No acute intracranial abnormality. Electronically Signed   By: Aram Candela M.D.   On: 02/10/2021 19:59   DG Chest Port 1 View  Result Date: 02/10/2021 CLINICAL DATA:  Hypotension, diaphoretic EXAM: PORTABLE CHEST 1 VIEW COMPARISON:  05/22/2020 FINDINGS: Stable cardiomediastinal contours. Low lung volumes with streaky bibasilar  opacities. No pleural effusion or pneumothorax. Osseous structures are demineralized. Degenerative changes of the shoulders. IMPRESSION: Low lung volumes with streaky bibasilar opacities, favor atelectasis. Electronically Signed   By: Duanne Guess D.O.   On: 02/10/2021 19:47     CBC Recent Labs  Lab 02/10/21 1913 02/11/21 0457 02/12/21 0516  WBC 8.3 6.6 4.0  HGB 18.4* 14.5 14.0  HCT 56.6* 43.2 42.2  PLT 186 126* 114*  MCV 90.1 89.8 89.6  MCH 29.3 30.1 29.7  MCHC 32.5 33.6 33.2  RDW 15.9* 14.9 14.6  LYMPHSABS 1.3  --   --   MONOABS 0.7  --   --   EOSABS 0.0  --   --   BASOSABS 0.1  --   --     Chemistries  Recent Labs  Lab 02/10/21 1913 02/11/21 0457 02/12/21 0516  NA 144 144 143  K 4.5 4.3 3.7  CL 106 113* 110  CO2 27 23 25   GLUCOSE 188* 93 68*  BUN 19 16 12   CREATININE 1.91* 1.43* 1.33*  CALCIUM 10.5* 8.6* 8.6*  AST 31 23  --   ALT 15 10  --   ALKPHOS 106 69  --   BILITOT 0.8 0.6  --    ------------------------------------------------------------------------------------------------------------------ No results for input(s): CHOL, HDL, LDLCALC, TRIG, CHOLHDL, LDLDIRECT in the last 72 hours.  Lab Results  Component Value Date   HGBA1C 6.2 (H) 02/10/2021   ------------------------------------------------------------------------------------------------------------------ Recent Labs  02/10/21 2245  TSH 9.989*   ------------------------------------------------------------------------------------------------------------------ No results for input(s): VITAMINB12, FOLATE, FERRITIN, TIBC, IRON, RETICCTPCT in the last 72 hours.  Coagulation profile Recent Labs  Lab 02/10/21 2027 02/11/21 0457  INR 1.1 1.3*    No results for input(s): DDIMER in the last 72 hours.  Cardiac Enzymes No results for input(s): CKMB, TROPONINI, MYOGLOBIN in the last 168 hours.  Invalid input(s):  CK ------------------------------------------------------------------------------------------------------------------ No results found for: BNP   Shon Hale M.D on 02/12/2021 at 6:38 PM  Go to [email or web address] - for contact info  [facility] - Office  [phone]

## 2021-02-12 NOTE — Progress Notes (Signed)
Morning glucose 71. Patient provided and consumed of Orange Juice. Will recheck glucose.

## 2021-02-12 NOTE — Progress Notes (Signed)
Patient glucose 56. Patient provided amp of D50 as he was refusing to consume PO fluids or meds at this time. Rechecked glucose, resulting 174. Will continue to monitor.

## 2021-02-12 NOTE — TOC Transition Note (Addendum)
Transition of Care [facility]) - CM/SW Discharge Note   Patient Details  Name: Robert Hurst MRN: [id number] Date of Birth: October 21, 1931  [facility]) CM/SW Contact:  Leitha Bleak, RN Phone Number: 02/12/2021, 1:39 PM   Clinical Narrative:   Patient medically ready to return to [facility]. RN to call report. Medical necessity completed. TOC updated [name] -daughter with discharge plan.   Addendum: Patient is sleepy and will not eat, move to unit 300 and watch another day, updated Debbie at [place].   Final next level of care: Skilled Nursing Facility     Patient Goals and CMS Choice Patient states their goals for this hospitalization and ongoing recovery are:: to return to SNF. CMS Medicare.gov Compare Post Acute Care list provided to:: Patient Represenative (must comment) Choice offered to / list presented to : Adult Children  Discharge Placement              Patient chooses bed at:  [facility]) Patient to be transferred to facility by: EMS Name of family member notified: [name] Patient and family notified of of transfer: 02/12/21  Discharge Plan and Services    [facility]  Readmission Risk Interventions Readmission Risk Prevention Plan 02/12/2021 05/25/2020  Transportation Screening Complete Complete  PCP or Specialist Appt within 5-7 Days Complete -  PCP or Specialist Appt within 3-5 Days - Not Complete  Not Complete comments - Patient is from [place]. He is seen by the facility medical director.  Home Care Screening Complete -  Medication Review (RN CM) Complete -  Social Work Consult for Recovery Care Planning/Counseling - Complete  Palliative Care Screening - Complete  Medication Review Oceanographer) - Complete  Some recent data might be hidden

## 2021-02-13 DIAGNOSIS — G9341 Metabolic encephalopathy: Principal | ICD-10-CM

## 2021-02-13 LAB — BASIC METABOLIC PANEL
Anion gap: 5 (ref 5–15)
BUN: 8 mg/dL (ref 8–23)
CO2: 24 mmol/L (ref 22–32)
Calcium: 8.4 mg/dL — ABNORMAL LOW (ref 8.9–10.3)
Chloride: 110 mmol/L (ref 98–111)
Creatinine, Ser: 1.21 mg/dL (ref 0.61–1.24)
GFR, Estimated: 58 mL/min — ABNORMAL LOW (ref >60)
Glucose, Bld: 97 mg/dL (ref 70–99)
Potassium: 3.2 mmol/L — ABNORMAL LOW (ref 3.5–5.1)
Sodium: 139 mmol/L (ref 135–145)

## 2021-02-13 LAB — GLUCOSE, CAPILLARY
Glucose-Capillary: 90 mg/dL (ref 70–99)
Glucose-Capillary: 91 mg/dL (ref 70–99)

## 2021-02-13 LAB — CORTISOL-AM, BLOOD: Cortisol - AM: 12.8 ug/dL (ref 6.7–22.6)

## 2021-02-13 MED ORDER — ENSURE ENLIVE PO LIQD: 237.0000 mL | Freq: Three times a day (TID) | ORAL | 12 refills | Status: DC

## 2021-02-13 MED ORDER — ONDANSETRON HCL 4 MG PO TABS: 4.0000 mg | ORAL_TABLET | Freq: Four times a day (QID) | ORAL | 0 refills | Status: AC | PRN

## 2021-02-13 MED ORDER — POTASSIUM CHLORIDE ER 20 MEQ PO TBCR: 20.0000 meq | EXTENDED_RELEASE_TABLET | Freq: Every day | ORAL | 0 refills | Status: DC

## 2021-02-13 MED ORDER — SODIUM CHLORIDE 0.9 % IV SOLN: 2.0000 g | Freq: Two times a day (BID) | INTRAVENOUS | Status: DC

## 2021-02-13 MED ORDER — POTASSIUM CHLORIDE 20 MEQ PO PACK
40.0000 meq | PACK | ORAL | Status: AC
  Administered 2021-02-13 (×2): 40 meq via ORAL
  Filled 2021-02-13 (×2): qty 2

## 2021-02-13 NOTE — Discharge Instructions (Signed)
1)Avoid ibuprofen/Advil/Aleve/Motrin/Goody Powders/Naproxen/BC powders/Meloxicam/Diclofenac/Indomethacin and other Nonsteroidal anti-inflammatory medications as these will make you more likely to bleed and can cause stomach ulcers, can also cause Kidney problems.   2)Repeat CBC and BMP blood test within a week  3) please consider palliative care consult  4) recheck TSH in about 6 weeks

## 2021-02-13 NOTE — Progress Notes (Signed)
Nsg Discharge Note  Admit Date:  02/10/2021 Discharge date: 02/13/2021   Robert Hurst to be D/C'd Skilled nursing facility per MD order.  AVS completed.  Copy for chart, and copy for patient signed, and dated. Patient/caregiver able to verbalize understanding.  Discharge Medication: Allergies as of 02/13/2021      Reactions   Penicillins Other (See Comments)   Unknown reaction      Medication List    STOP taking these medications   free water Soln     TAKE these medications   acetaminophen 325 MG tablet Commonly known as: TYLENOL Take 2 tablets (650 mg total) by mouth every 6 (six) hours as needed for mild pain, fever or headache (fever >/= 101).   apixaban 2.5 MG Tabs tablet Commonly known as: ELIQUIS Take 1 tablet (2.5 mg total) by mouth 2 (two) times daily.   feeding supplement Liqd Take 237 mLs by mouth 3 (three) times daily.   levothyroxine 50 MCG tablet Commonly known as: SYNTHROID Take 1 tablet (50 mcg total) by mouth daily at 6 (six) AM.   memantine 10 MG tablet Commonly known as: NAMENDA Take 10 mg by mouth 2 (two) times daily.   ondansetron 4 MG tablet Commonly known as: ZOFRAN Take 1 tablet (4 mg total) by mouth every 6 (six) hours as needed for nausea.   polyethylene glycol 17 g packet Commonly known as: MIRALAX / GLYCOLAX Take 17 g by mouth daily.   Potassium Chloride ER 20 MEQ Tbcr Take 20 mEq by mouth daily. 1 tab daily by mouth   senna 8.6 MG tablet Commonly known as: SENOKOT Take 1 tablet by mouth daily.       Discharge Assessment: Vitals:   02/13/21 0512 02/13/21 1353  BP: 136/69 (!) 143/63  Pulse: (!) 46 88  Resp: 18 16  Temp: 98.2 F (36.8 C) 97.6 F (36.4 C)  SpO2: 99% 95%   Skin clean, dry and intact without evidence of skin break down, no evidence of skin tears noted. IV catheter discontinued intact. Site without signs and symptoms of complications - no redness or edema noted at insertion site, patient denies c/o pain - only  slight tenderness at site.  Dressing with slight pressure applied.  D/c Instructions-Education: Discharge instructions given to patient/family with verbalized understanding. D/c education completed with patient/family including follow up instructions, medication list, d/c activities limitations if indicated, with other d/c instructions as indicated by MD - patient able to verbalize understanding, all questions fully answered. Patient instructed to return to ED, call 911, or call MD for any changes in condition.  Patient escorted via WC, and D/C home via private auto.  Brandy Hale, LPN 04/17/2034 5:97 PM

## 2021-02-13 NOTE — Progress Notes (Signed)
Patient being discharged to [facility] skilled Nursing facility. Report called and give to Myrtie Neither, LPN. Awaiting for transport by [facility] [place].

## 2021-02-13 NOTE — TOC Transition Note (Signed)
Transition of Care [facility]) - CM/SW Discharge Note   Patient Details  Name: Robert Hurst MRN: [id number] Date of Birth: [birth year]  Transition of Care [facility]) CM/SW Contact:  Karn Cassis, LCSW Phone Number: 02/13/2021, 3:14 PM   Clinical Narrative:  Pt d/c today back to [facility]. Pt's daughter, [name] and facility aware and agreeable. [name] requesting [facility] transport. D/C summary sent to facility. RN given number to call report.     Final next level of care: Skilled Nursing Facility Barriers to Discharge: Barriers Resolved   Patient Goals and CMS Choice Patient states their goals for this hospitalization and ongoing recovery are:: to return to SNF. CMS Medicare.gov Compare Post Acute Care list provided to:: Patient Represenative (must comment) Choice offered to / list presented to : Adult Children  Discharge Placement              Patient chooses bed at: Other - please specify in the comment section below: ([facility]) Patient to be transferred to facility by: EMS Name of family member notified: [name]- daughter Patient and family notified of of transfer: 02/13/21  Discharge Plan and Services                                     Social Determinants of Health (SDOH) Interventions     Readmission Risk Interventions Readmission Risk Prevention Plan 02/12/2021 05/25/2020  Transportation Screening Complete Complete  PCP or Specialist Appt within 5-7 Days Complete -  PCP or Specialist Appt within 3-5 Days - Not Complete  Not Complete comments - Patient is from [place]. He is seen by the facility medical director.  Home Care Screening Complete -  Medication Review (RN CM) Complete -  Social Work Consult for Recovery Care Planning/Counseling - Complete  Palliative Care Screening - Complete  Medication Review Oceanographer) - Complete  Some recent data might be hidden

## 2021-02-13 NOTE — Discharge Summary (Signed)
Robert Hurst, is a 87 y.o. male  DOB Feb 03, 1932  MRN [id number].  Admission date:  02/10/2021  Admitting Physician  Levie Heritage, DO  Discharge Date:  02/13/2021   Primary MD  [name], MD  Recommendations for primary care physician for things to follow:   1)Avoid ibuprofen/Advil/Aleve/Motrin/Goody Powders/Naproxen/BC powders/Meloxicam/Diclofenac/Indomethacin and other Nonsteroidal anti-inflammatory medications as these will make you more likely to bleed and can cause stomach ulcers, can also cause Kidney problems.   2)Repeat CBC and BMP blood test within a week  3) please consider palliative care consult   Admission Diagnosis  Severe sepsis (HCC) [A41.9, R65.20] Altered mental status, unspecified altered mental status type [R41.82]   Discharge Diagnosis  Severe sepsis (HCC) [A41.9, R65.20] Altered mental status, unspecified altered mental status type [R41.82]    Principal Problem:   Acute metabolic encephalopathy Active Problems:   Dementia without behavioral disturbance (HCC)   History of DVT (deep vein thrombosis)   Acute renal failure superimposed on stage 3b chronic kidney disease (HCC)   Lactic acidosis   Hypothyroidism      Past Medical History:  Diagnosis Date  . Alzheimer disease (HCC)   . Calculus of bile duct (any) with acute cholecystitis   . Cognitive communication deficit   . COVID-19   . Dementia (HCC)   . DVT (deep venous thrombosis) (HCC)   . Dysphagia   . Malnourished (HCC)   . Metabolic encephalopathy   . Protein calorie malnutrition (HCC)   . Renal disorder   . Syncope and collapse   . Thyroid disease     Past Surgical History:  Procedure Laterality Date  . APPENDECTOMY    . BALLOON DILATION N/A 01/15/2018   Procedure: BALLOON DILATION;  Surgeon: Malissa Hippo, MD;  [facility];  Service: Endoscopy;  Laterality: N/A;  . ERCP N/A 01/15/2018    Procedure: ENDOSCOPIC RETROGRADE CHOLANGIOPANCREATOGRAPHY (ERCP);  Surgeon: Malissa Hippo, MD;  [facility];  Service: Endoscopy;  Laterality: N/A;  . REMOVAL OF STONES N/A 01/15/2018   Procedure: REMOVAL OF STONES;  Surgeon: Malissa Hippo, MD;  [facility];  Service: Endoscopy;  Laterality: N/A;  . SPHINCTEROTOMY N/A 01/15/2018   Procedure: SPHINCTEROTOMY;  Surgeon: Malissa Hippo, MD;  [facility];  Service: Endoscopy;  Laterality: N/A;  . TONSILLECTOMY       HPI  from the history and physical done on the day of admission:     Patient Coming From: [facility]  Chief Complaint: decreased responsiveness  HPI: Robert Hurst is a 87 y.o. male with a history of left Alzheimer's dementia, hypothyroidism, malnutrition.  Patient seen for decreased responsiveness.  Patient unable to provide history.  He is a resident of [facility] and was less talkative and less responsive than he normally is.  He was sent to the hospital for evaluation.  Emergency Department Course: Lactic acid 5.4.  White count normal at 8.  Creatinine 1.91.  Chest x-ray significant for atelectasis.  No infiltrates.  UA shows hematuria but no leukocytes or bacteria on micro.  Blood cultures obtained.  Rocephin started  Review of Systems:  Not able to obtain    Hospital Course:     Brief Summary:- 87 y.o.malewith a history of left Alzheimer's dementia, hypothyroidism, malnutrition admitted from [facility] with concerns for dehydration and altered mentation, possible sepsis  A/p 1)Acute metabolic encephalopathy--- serum ammonia levels not elevated -Sepsis ruled out, cultures NGTD -Episodes of hypoglycemia associated with poor oral intake -Ensure nutritional supplements and encouragement with oral intake strongly advised  2)Lactic acidosis--sepsis ruled out -Lactic acidosis normalized with IV fluids Patient was treated with IV vancomycin Flagyl and  cefepime through 02/13/2021 -No further antibiotics as cultures are NGTD  3)H/o DVT --continue Eliquis 2.5 mg twice daily given age and weight  4)AKI----acute kidney injury on CKD stage - 3A   creatinine on admission= 1.91  , baseline creatinine =  1.3 to 1.5   ,  -creatinine is now= 1.2  , -Renal function improved significantly with hydration  renally adjust medications, avoid nephrotoxic agents / dehydration  / hypotension -Recheck BMP within a week   5)Hypothyroidism--TSH 9.9 , patient was not compliant with levothyroxine PTA -restarted levothyroxine 50 mcg daily -Recheck TSH in 6  weeks  6)Dementia --okay to restart Namenda  Disposition--back to SNF    Disposition: The patient is from: SNF  Anticipated d/c is to: SNF   Code Status :  -  Code Status: DNR   Family Communication:   Discussed with daughter  [name] Consults  :  na  Discharge Condition: stable  Follow UP--- PCP at SNF   Consults obtained - na  Diet and Activity recommendation:  As advised  Discharge Instructions     Discharge Instructions    Call MD for:  difficulty breathing, headache or visual disturbances   Complete by: As directed    Call MD for:  persistant dizziness or light-headedness   Complete by: As directed    Call MD for:  persistant nausea and vomiting   Complete by: As directed    Call MD for:  temperature >100.4   Complete by: As directed    Diet general   Complete by: As directed    Discharge instructions   Complete by: As directed    1)Avoid ibuprofen/Advil/Aleve/Motrin/Goody Powders/Naproxen/BC powders/Meloxicam/Diclofenac/Indomethacin and other Nonsteroidal anti-inflammatory medications as these will make you more likely to bleed and can cause stomach ulcers, can also cause Kidney problems.   2)Repeat CBC and BMP blood test within a week  3) please consider palliative care consult  4) recheck TSH in about 6 weeks   Increase activity slowly    Complete by: As directed        Discharge Medications     Allergies as of 02/13/2021      Reactions   Penicillins Other (See Comments)   Unknown reaction      Medication List    STOP taking these medications   free water Soln     TAKE these medications   acetaminophen 325 MG tablet Commonly known as: TYLENOL Take 2 tablets (650 mg total) by mouth every 6 (six) hours as needed for mild pain, fever or headache (fever >/= 101).   apixaban 2.5 MG Tabs tablet Commonly known as: ELIQUIS Take 1 tablet (2.5 mg total) by mouth 2 (two) times daily.   feeding supplement Liqd Take 237 mLs by mouth 3 (three) times daily.   levothyroxine 50 MCG tablet Commonly known as:  SYNTHROID Take 1 tablet (50 mcg total) by mouth daily at 6 (six) AM.   memantine 10 MG tablet Commonly known as: NAMENDA Take 10 mg by mouth 2 (two) times daily.   ondansetron 4 MG tablet Commonly known as: ZOFRAN Take 1 tablet (4 mg total) by mouth every 6 (six) hours as needed for nausea.   polyethylene glycol 17 g packet Commonly known as: MIRALAX / GLYCOLAX Take 17 g by mouth daily.   Potassium Chloride ER 20 MEQ Tbcr Take 20 mEq by mouth daily. 1 tab daily by mouth   senna 8.6 MG tablet Commonly known as: SENOKOT Take 1 tablet by mouth daily.      Major procedures and Radiology Reports - PLEASE review detailed and final reports for all details, in brief -   CT Head Wo Contrast  Result Date: 02/10/2021 CLINICAL DATA:  Status post fall. EXAM: CT HEAD WITHOUT CONTRAST TECHNIQUE: Contiguous axial images were obtained from the base of the skull through the vertex without intravenous contrast. COMPARISON:  October 06, 2020 FINDINGS: Brain: There is mild to moderate severity cerebral atrophy with widening of the extra-axial spaces and ventricular dilatation. There are areas of decreased attenuation within the white matter tracts of the supratentorial brain, consistent with microvascular disease changes. A  small chronic right basal ganglia lacunar infarct is seen. Vascular: No hyperdense vessel or unexpected calcification. Skull: Normal. Negative for fracture or focal lesion. Sinuses/Orbits: No acute finding. Other: A small right frontal scalp soft tissue defect is noted. IMPRESSION: 1. Generalized cerebral atrophy. 2. No acute intracranial abnormality. Electronically Signed   By: Aram Candela M.D.   On: 02/10/2021 19:59   DG Chest Port 1 View  Result Date: 02/10/2021 CLINICAL DATA:  Hypotension, diaphoretic EXAM: PORTABLE CHEST 1 VIEW COMPARISON:  05/22/2020 FINDINGS: Stable cardiomediastinal contours. Low lung volumes with streaky bibasilar opacities. No pleural effusion or pneumothorax. Osseous structures are demineralized. Degenerative changes of the shoulders. IMPRESSION: Low lung volumes with streaky bibasilar opacities, favor atelectasis. Electronically Signed   By: Duanne Guess D.O.   On: 02/10/2021 19:47   Micro Results   Recent Results (from the past 240 hour(s))  Resp Panel by RT-PCR (Flu A&B, Covid) Nasopharyngeal Swab     Status: None   Collection Time: 02/10/21  7:19 PM   Specimen: Nasopharyngeal Swab; Nasopharyngeal(NP) swabs in vial transport medium  Result Value Ref Range Status   SARS Coronavirus 2 by RT PCR NEGATIVE NEGATIVE Final    Comment: (NOTE) SARS-CoV-2 target nucleic acids are NOT DETECTED.  The SARS-CoV-2 RNA is generally detectable in upper respiratory specimens during the acute phase of infection. The lowest concentration of SARS-CoV-2 viral copies this assay can detect is 138 copies/mL. A negative result does not preclude SARS-Cov-2 infection and should not be used as the sole basis for treatment or other patient management decisions. A negative result may occur with  improper specimen collection/handling, submission of specimen other than nasopharyngeal swab, presence of viral mutation(s) within the areas targeted by this assay, and inadequate number of  viral copies(<138 copies/mL). A negative result must be combined with clinical observations, patient history, and epidemiological information. The expected result is Negative.  Fact Sheet for Patients:  BloggerCourse.com  Fact Sheet for Healthcare Providers:  SeriousBroker.it  This test is no t yet approved or cleared by the [place] FDA and  has been authorized for detection and/or diagnosis of SARS-CoV-2 by FDA under an Emergency Use Authorization (EUA). This EUA will remain  in effect (  meaning this test can be used) for the duration of the COVID-19 declaration under Section 564(b)(1) of the Act, 21 U.S.C.section 360bbb-3(b)(1), unless the authorization is terminated  or revoked sooner.       Influenza A by PCR NEGATIVE NEGATIVE Final   Influenza B by PCR NEGATIVE NEGATIVE Final    Comment: (NOTE) The Xpert Xpress SARS-CoV-2/FLU/RSV plus assay is intended as an aid in the diagnosis of influenza from Nasopharyngeal swab specimens and should not be used as a sole basis for treatment. Nasal washings and aspirates are unacceptable for Xpert Xpress SARS-CoV-2/FLU/RSV testing.  Fact Sheet for Patients: BloggerCourse.comhttps://www.fda.gov/media/152166/download  Fact Sheet for Healthcare Providers: SeriousBroker.ithttps://www.fda.gov/media/152162/download  This test is not yet approved or cleared by the [place] FDA and has been authorized for detection and/or diagnosis of SARS-CoV-2 by FDA under an Emergency Use Authorization (EUA). This EUA will remain in effect (meaning this test can be used) for the duration of the COVID-19 declaration under Section 564(b)(1) of the Act, 21 U.S.C. section 360bbb-3(b)(1), unless the authorization is terminated or revoked.  Performed at [facility], [address] [place], [place] [address]   Urine culture     Status: None   Collection Time: 02/10/21  7:30 PM   Specimen: Urine, Catheterized  Result Value Ref  Range Status   Specimen Description   Final    URINE, CATHETERIZED Performed at [facility], [address] [place], [place] [address]    Special Requests   Final    NONE Performed at [facility], [address] [place], [place] [address]    Culture   Final    NO GROWTH Performed at [facility], [address] [place], [place] [address]    Report Status 02/12/2021 FINAL  Final  Blood Culture (routine x 2)     Status: None (Preliminary result)   Collection Time: 02/10/21  8:15 PM   Specimen: BLOOD  Result Value Ref Range Status   Specimen Description BLOOD RIGHT ANTECUBITAL  Final   Special Requests AEROBIC BOTTLE ONLY Blood Culture adequate volume  Final   Culture   Final    NO GROWTH 2 DAYS Performed at [facility], [address] [place], [place] [address]    Report Status PENDING  Incomplete  Blood Culture (routine x 2)     Status: None (Preliminary result)   Collection Time: 02/10/21  8:27 PM   Specimen: BLOOD  Result Value Ref Range Status   Specimen Description BLOOD LEFT ANTECUBITAL  Final   Special Requests AEROBIC BOTTLE ONLY Blood Culture adequate volume  Final   Culture   Final    NO GROWTH 2 DAYS Performed at [facility], [address] [place], [place] [address]    Report Status PENDING  Incomplete  MRSA PCR Screening     Status: None   Collection Time: 02/10/21 11:12 PM   Specimen: Nasal Mucosa; Nasopharyngeal  Result Value Ref Range Status   MRSA by PCR NEGATIVE NEGATIVE Final    Comment:        The GeneXpert MRSA Assay (FDA approved for NASAL specimens only), is one component of a comprehensive MRSA colonization surveillance program. It is not intended to diagnose MRSA infection nor to guide or monitor treatment for MRSA infections. Performed at [facility], [address] [place], [place] [address]        Today   Subjective    Cindi CarbonJoe Huesca today has no new complaints, more awake, eating better today  Patient has been  seen and examined prior to discharge   Objective   Blood pressure (!) 143/63, pulse 88, temperature 97.6 F (36.4 C), temperature source Oral, resp. rate 16, height 5\' 5"  (1.651 m), weight 55.5 kg, SpO2 95 %.   Intake/Output Summary (Last 24 hours) at 02/13/2021 1444 Last data filed at 02/13/2021 0830 Gross per 24 hour  Intake 1980.88 ml  Output 1025 ml  Net 955.88 ml    Exam Gen:- Awake Alert, no acute distress, cachectic and chronically ill-appearing HEENT:- [place].AT, No sclera icterus Neck-Supple Neck,No JVD,.  Lungs-  CTAB , good air movement bilaterally  CV- S1, S2 normal, regular Abd-  +ve B.Sounds, Abd Soft, No tenderness,    Extremity/Skin:- No  edema,   good pulses Psych-baseline cognitive and memory deficits consistent with underlying dementia  neuro-generalized weakness no new focal deficits, no tremors    Data Review   CBC w Diff:  Lab Results  Component Value Date   WBC 4.0 02/12/2021   HGB 14.0 02/12/2021   HCT 42.2 02/12/2021   PLT 114 (L) 02/12/2021   LYMPHOPCT 16 02/10/2021   MONOPCT 9 02/10/2021   EOSPCT 1 02/10/2021   BASOPCT 1 02/10/2021    CMP:  Lab Results  Component Value Date   NA 139 02/13/2021   K 3.2 (L) 02/13/2021   CL 110 02/13/2021   CO2 24 02/13/2021   BUN 8 02/13/2021   CREATININE 1.21 02/13/2021   PROT 6.2 (L) 02/11/2021   ALBUMIN 3.1 (L) 02/11/2021   BILITOT 0.6 02/11/2021   ALKPHOS 69 02/11/2021   AST 23 02/11/2021   ALT 10 02/11/2021  .   Total Discharge time is about 33 minutes  02/13/2021 M.D on 02/13/2021 at 2:44 PM  Go to [email or web address] -  for contact info  [facility] - Office  [phone]

## 2021-02-13 NOTE — Progress Notes (Signed)
EMS arrived to pick up patient.  Vitals stable.  Patient stable.  Patient dicharged to facility via EMS

## 2021-02-13 NOTE — Care Management Important Message (Signed)
Important Message  Patient Details  Name: Robert Hurst MRN: [id number] Date of Birth: 1931-11-12   Medicare Important Message Given:  Yes (mailed to [address] [place], [place], [place] [address])     Corey Harold 02/13/2021, 3:56 PM

## 2021-02-15 LAB — CULTURE, BLOOD (ROUTINE X 2)
Culture: NO GROWTH
Culture: NO GROWTH
Special Requests: ADEQUATE
Special Requests: ADEQUATE

## 2021-06-24 ENCOUNTER — Encounter (HOSPITAL_COMMUNITY): Payer: Self-pay | Admitting: Radiology

## 2021-06-24 ENCOUNTER — Inpatient Hospital Stay (HOSPITAL_COMMUNITY)
Admission: EM | Admit: 2021-06-24 | Discharge: 2021-06-27 | DRG: 071 | Disposition: A | Discharge status: Skilled Nursing Facility | Payer: Medicare Other | Source: Skilled Nursing Facility | Attending: Family Medicine | Admitting: Family Medicine

## 2021-06-24 ENCOUNTER — Other Ambulatory Visit: Payer: Self-pay

## 2021-06-24 ENCOUNTER — Emergency Department (HOSPITAL_COMMUNITY): Payer: Medicare Other

## 2021-06-24 ENCOUNTER — Inpatient Hospital Stay (HOSPITAL_COMMUNITY): Payer: Medicare Other

## 2021-06-24 DIAGNOSIS — E872 Acidosis, unspecified: Secondary | ICD-10-CM

## 2021-06-24 DIAGNOSIS — Z880 Allergy status to penicillin: Secondary | ICD-10-CM | POA: Diagnosis not present

## 2021-06-24 DIAGNOSIS — Z7901 Long term (current) use of anticoagulants: Secondary | ICD-10-CM | POA: Diagnosis not present

## 2021-06-24 DIAGNOSIS — F0280 Dementia in other diseases classified elsewhere without behavioral disturbance: Secondary | ICD-10-CM | POA: Diagnosis present

## 2021-06-24 DIAGNOSIS — E039 Hypothyroidism, unspecified: Secondary | ICD-10-CM | POA: Diagnosis present

## 2021-06-24 DIAGNOSIS — R4182 Altered mental status, unspecified: Secondary | ICD-10-CM

## 2021-06-24 DIAGNOSIS — E860 Dehydration: Secondary | ICD-10-CM | POA: Diagnosis present

## 2021-06-24 DIAGNOSIS — G309 Alzheimer's disease, unspecified: Secondary | ICD-10-CM | POA: Diagnosis present

## 2021-06-24 DIAGNOSIS — G9341 Metabolic encephalopathy: Principal | ICD-10-CM

## 2021-06-24 DIAGNOSIS — N183 Chronic kidney disease, stage 3 unspecified: Secondary | ICD-10-CM | POA: Diagnosis present

## 2021-06-24 DIAGNOSIS — F0390 Unspecified dementia without behavioral disturbance: Secondary | ICD-10-CM

## 2021-06-24 DIAGNOSIS — Z993 Dependence on wheelchair: Secondary | ICD-10-CM

## 2021-06-24 DIAGNOSIS — N1832 Chronic kidney disease, stage 3b: Secondary | ICD-10-CM | POA: Diagnosis present

## 2021-06-24 DIAGNOSIS — R41841 Cognitive communication deficit: Secondary | ICD-10-CM | POA: Diagnosis present

## 2021-06-24 DIAGNOSIS — G9340 Encephalopathy, unspecified: Secondary | ICD-10-CM

## 2021-06-24 DIAGNOSIS — N189 Chronic kidney disease, unspecified: Secondary | ICD-10-CM

## 2021-06-24 DIAGNOSIS — N289 Disorder of kidney and ureter, unspecified: Secondary | ICD-10-CM | POA: Diagnosis not present

## 2021-06-24 DIAGNOSIS — A419 Sepsis, unspecified organism: Secondary | ICD-10-CM

## 2021-06-24 DIAGNOSIS — E538 Deficiency of other specified B group vitamins: Secondary | ICD-10-CM | POA: Diagnosis present

## 2021-06-24 DIAGNOSIS — Z86718 Personal history of other venous thrombosis and embolism: Secondary | ICD-10-CM | POA: Diagnosis not present

## 2021-06-24 DIAGNOSIS — Z79899 Other long term (current) drug therapy: Secondary | ICD-10-CM | POA: Diagnosis not present

## 2021-06-24 DIAGNOSIS — Z66 Do not resuscitate: Secondary | ICD-10-CM | POA: Diagnosis present

## 2021-06-24 DIAGNOSIS — Z79890 Hormone replacement therapy (postmenopausal): Secondary | ICD-10-CM | POA: Diagnosis not present

## 2021-06-24 DIAGNOSIS — N179 Acute kidney failure, unspecified: Secondary | ICD-10-CM | POA: Diagnosis present

## 2021-06-24 DIAGNOSIS — Z20822 Contact with and (suspected) exposure to covid-19: Secondary | ICD-10-CM | POA: Diagnosis present

## 2021-06-24 LAB — CBC WITH DIFFERENTIAL/PLATELET
Abs Immature Granulocytes: 0.05 10*3/uL (ref 0.00–0.07)
Basophils Absolute: 0 10*3/uL (ref 0.0–0.1)
Basophils Relative: 0 %
Eosinophils Absolute: 0.1 10*3/uL (ref 0.0–0.5)
Eosinophils Relative: 1 %
HCT: 52.8 % — ABNORMAL HIGH (ref 39.0–52.0)
Hemoglobin: 17.7 g/dL — ABNORMAL HIGH (ref 13.0–17.0)
Immature Granulocytes: 1 %
Lymphocytes Relative: 37 %
Lymphs Abs: 3.8 10*3/uL (ref 0.7–4.0)
MCH: 29.9 pg (ref 26.0–34.0)
MCHC: 33.5 g/dL (ref 30.0–36.0)
MCV: 89.3 fL (ref 80.0–100.0)
Monocytes Absolute: 1.1 10*3/uL — ABNORMAL HIGH (ref 0.1–1.0)
Monocytes Relative: 10 %
Neutro Abs: 5.2 10*3/uL (ref 1.7–7.7)
Neutrophils Relative %: 51 %
Platelets: 194 10*3/uL (ref 150–400)
RBC: 5.91 MIL/uL — ABNORMAL HIGH (ref 4.22–5.81)
RDW: 14.4 % (ref 11.5–15.5)
WBC: 10.3 10*3/uL (ref 4.0–10.5)
nRBC: 0 % (ref 0.0–0.2)

## 2021-06-24 LAB — COMPREHENSIVE METABOLIC PANEL
ALT: 11 U/L (ref 0–44)
AST: 23 U/L (ref 15–41)
Albumin: 4 g/dL (ref 3.5–5.0)
Alkaline Phosphatase: 103 U/L (ref 38–126)
Anion gap: 11 (ref 5–15)
BUN: 19 mg/dL (ref 8–23)
CO2: 21 mmol/L — ABNORMAL LOW (ref 22–32)
Calcium: 9.4 mg/dL (ref 8.9–10.3)
Chloride: 108 mmol/L (ref 98–111)
Creatinine, Ser: 1.6 mg/dL — ABNORMAL HIGH (ref 0.61–1.24)
GFR, Estimated: 41 mL/min — ABNORMAL LOW (ref >60)
Glucose, Bld: 202 mg/dL — ABNORMAL HIGH (ref 70–99)
Potassium: 3.8 mmol/L (ref 3.5–5.1)
Sodium: 140 mmol/L (ref 135–145)
Total Bilirubin: 1 mg/dL (ref 0.3–1.2)
Total Protein: 8.2 g/dL — ABNORMAL HIGH (ref 6.5–8.1)

## 2021-06-24 LAB — URINALYSIS, ROUTINE W REFLEX MICROSCOPIC
Bilirubin Urine: NEGATIVE
Glucose, UA: NEGATIVE mg/dL
Ketones, ur: NEGATIVE mg/dL
Leukocytes,Ua: NEGATIVE
Nitrite: NEGATIVE
Protein, ur: 30 mg/dL — AB
Specific Gravity, Urine: 1.009 (ref 1.005–1.030)
pH: 6 (ref 5.0–8.0)

## 2021-06-24 LAB — PROCALCITONIN: Procalcitonin: <0.1 ng/mL

## 2021-06-24 LAB — RESP PANEL BY RT-PCR (FLU A&B, COVID) ARPGX2
Influenza A by PCR: NEGATIVE
Influenza B by PCR: NEGATIVE
SARS Coronavirus 2 by RT PCR: NEGATIVE

## 2021-06-24 LAB — LACTIC ACID, PLASMA
Lactic Acid, Venous: 4 mmol/L (ref 0.5–1.9)
Lactic Acid, Venous: 5 mmol/L (ref 0.5–1.9)
Lactic Acid, Venous: 4.5 mmol/L (ref 0.5–1.9)

## 2021-06-24 LAB — CBG MONITORING, ED: Glucose-Capillary: 206 mg/dL — ABNORMAL HIGH (ref 70–99)

## 2021-06-24 LAB — TROPONIN I (HIGH SENSITIVITY)
Troponin I (High Sensitivity): 20 ng/L — ABNORMAL HIGH (ref <18)
Troponin I (High Sensitivity): 18 ng/L — ABNORMAL HIGH (ref <18)

## 2021-06-24 MED ORDER — ONDANSETRON HCL 4 MG PO TABS: 4.0000 mg | ORAL_TABLET | Freq: Four times a day (QID) | ORAL | Status: DC | PRN

## 2021-06-24 MED ORDER — METRONIDAZOLE 500 MG/100ML IV SOLN
500.0000 mg | Freq: Once | INTRAVENOUS | Status: AC
  Administered 2021-06-24: 500 mg via INTRAVENOUS
  Filled 2021-06-24: qty 100

## 2021-06-24 MED ORDER — SODIUM CHLORIDE 0.9 % IV SOLN
2.0000 g | INTRAVENOUS | Status: DC
  Administered 2021-06-24 – 2021-06-26 (×3): 2 g via INTRAVENOUS
  Filled 2021-06-24 (×2): qty 2

## 2021-06-24 MED ORDER — LACTATED RINGERS IV BOLUS
1000.0000 mL | Freq: Once | INTRAVENOUS | Status: AC
  Administered 2021-06-24: 1000 mL via INTRAVENOUS

## 2021-06-24 MED ORDER — SODIUM CHLORIDE 0.9 % IV SOLN: INTRAVENOUS | Status: DC

## 2021-06-24 MED ORDER — ACETAMINOPHEN 650 MG RE SUPP: 650.0000 mg | Freq: Four times a day (QID) | RECTAL | Status: DC | PRN

## 2021-06-24 MED ORDER — POLYETHYLENE GLYCOL 3350 17 G PO PACK: 17.0000 g | PACK | Freq: Every day | ORAL | Status: DC | PRN

## 2021-06-24 MED ORDER — SODIUM CHLORIDE 0.9 % IV SOLN
2.0000 g | Freq: Once | INTRAVENOUS | Status: DC
  Filled 2021-06-24: qty 2

## 2021-06-24 MED ORDER — VANCOMYCIN HCL IN DEXTROSE 1-5 GM/200ML-% IV SOLN: 1000.0000 mg | INTRAVENOUS | Status: DC

## 2021-06-24 MED ORDER — SODIUM CHLORIDE 0.9 % IV BOLUS
1000.0000 mL | Freq: Once | INTRAVENOUS | Status: AC
  Administered 2021-06-24: 1000 mL via INTRAVENOUS

## 2021-06-24 MED ORDER — ACETAMINOPHEN 325 MG PO TABS: 650.0000 mg | ORAL_TABLET | Freq: Four times a day (QID) | ORAL | Status: DC | PRN

## 2021-06-24 MED ORDER — METRONIDAZOLE 500 MG/100ML IV SOLN
500.0000 mg | Freq: Two times a day (BID) | INTRAVENOUS | Status: DC
  Administered 2021-06-25 – 2021-06-26 (×4): 500 mg via INTRAVENOUS
  Filled 2021-06-24 (×4): qty 100

## 2021-06-24 MED ORDER — VANCOMYCIN HCL IN DEXTROSE 1-5 GM/200ML-% IV SOLN
1000.0000 mg | Freq: Once | INTRAVENOUS | Status: AC
  Administered 2021-06-24: 1000 mg via INTRAVENOUS
  Filled 2021-06-24: qty 200

## 2021-06-24 MED ORDER — ENOXAPARIN SODIUM 30 MG/0.3ML IJ SOSY
30.0000 mg | PREFILLED_SYRINGE | INTRAMUSCULAR | Status: DC
  Administered 2021-06-24 – 2021-06-25 (×2): 30 mg via SUBCUTANEOUS
  Filled 2021-06-24 (×2): qty 0.3

## 2021-06-24 MED ORDER — ONDANSETRON HCL 4 MG/2ML IJ SOLN: 4.0000 mg | Freq: Four times a day (QID) | INTRAMUSCULAR | Status: DC | PRN

## 2021-06-24 NOTE — H&P (Signed)
History and Physical    Robert Hurst DZH:[id number] DOB: [birth year] DOA: 06/24/2021  PCP: [name], MD   Patient coming from:[facility]  I have personally briefly reviewed patient's old medical records in [facility]  Chief Complaint: AMS  HPI: Robert Hurst is a 87 y.o. male with medical history significant for  DVT, Dementia, CKD3b. Presented to the ED from [facility] with reports of change in mental status. On my evaluation patient is purposely closing his eyes, resisting exam, and not talking. I called staff at nursing home, talked to [facility], she says at baseline he is alert and oriented, patient can walk from his bed to wheel chair, will talk when he wants but not much, and he is sometimes combative. He was wheeling himself around today, no cough, difficulty breathing diarrhea or other symptoms noted. Then patient vomited, became cold and clammy and unresponsive, responding only to pain.  CNA was with patient when he vomited, they dont think he aspirated. They were unable to measure his blood pressure and o2 sat was 95 % on room air.  Talked to daughters, they think their father is in pain, and this is unusual and are concerned for kidney stones.  ED Course: Temp 99, heart rate 48 - 51, blood pressure 84 - 119, sats 95% on room air. Chest xray and Head Ct both negative for acute abnormality. Lactic acid- 4. UA pending. 2 L bolus given.  Started on broad spectrum antibiotics Vanc, metronidazole and cefepime.  Review of Systems: Unable to ascertain 2/2 baseline line dementia and AMS  Past Medical History:  Diagnosis Date   Alzheimer disease (HCC)    Calculus of bile duct (any) with acute cholecystitis    Cognitive communication deficit    COVID-19    Dementia (HCC)    DVT (deep venous thrombosis) (HCC)    Dysphagia    Malnourished (HCC)    Metabolic encephalopathy    Protein calorie malnutrition (HCC)    Renal disorder    Syncope and collapse    Thyroid disease     Past  Surgical History:  Procedure Laterality Date   APPENDECTOMY     BALLOON DILATION N/A 01/15/2018   Procedure: BALLOON DILATION;  Surgeon: Malissa Hippo, MD;  [facility];  Service: Endoscopy;  Laterality: N/A;   ERCP N/A 01/15/2018   Procedure: ENDOSCOPIC RETROGRADE CHOLANGIOPANCREATOGRAPHY (ERCP);  Surgeon: Malissa Hippo, MD;  [facility];  Service: Endoscopy;  Laterality: N/A;   REMOVAL OF STONES N/A 01/15/2018   Procedure: REMOVAL OF STONES;  Surgeon: Malissa Hippo, MD;  [facility];  Service: Endoscopy;  Laterality: N/A;   SPHINCTEROTOMY N/A 01/15/2018   Procedure: SPHINCTEROTOMY;  Surgeon: Malissa Hippo, MD;  [facility];  Service: Endoscopy;  Laterality: N/A;   TONSILLECTOMY       reports that he has never smoked. He has never used smokeless tobacco. He reports that he does not currently use alcohol. He reports that he does not currently use drugs.  Allergies  Allergen Reactions   Penicillins Other (See Comments)    Unknown reaction    No family history on file.  Prior to Admission medications   Medication Sig Start Date End Date Taking? Authorizing Provider  acetaminophen (TYLENOL) 325 MG tablet Take 2 tablets (650 mg total) by mouth every 6 (six) hours as needed for mild pain, fever or headache (fever >/= 101). 05/26/20   Johnson, Clanford L, MD  apixaban (ELIQUIS) 2.5  MG TABS tablet Take 1 tablet (2.5 mg total) by mouth 2 (two) times daily. Patient not taking: Reported on 02/11/2021 08/17/19 04/30/21  Maurilio Lovely D, DO  feeding supplement (ENSURE ENLIVE / ENSURE PLUS) LIQD Take 237 mLs by mouth 3 (three) times daily. 02/13/21   Shon Hale, MD  levothyroxine (SYNTHROID) 50 MCG tablet Take 1 tablet (50 mcg total) by mouth daily at 6 (six) AM. Patient not taking: Reported on 02/11/2021 05/05/20   Catarina Hartshorn, MD  memantine (NAMENDA) 10 MG tablet Take 10 mg by mouth 2 (two) times daily. Patient not taking: Reported on  02/11/2021    [provider]  ondansetron (ZOFRAN) 4 MG tablet Take 1 tablet (4 mg total) by mouth every 6 (six) hours as needed for nausea. 02/13/21   Shon Hale, MD  polyethylene glycol (MIRALAX / GLYCOLAX) 17 g packet Take 17 g by mouth daily. Patient not taking: Reported on 02/11/2021    [provider]  Potassium Chloride ER 20 MEQ TBCR Take 20 mEq by mouth daily. 1 tab daily by mouth 02/13/21   Shon Hale, MD  senna (SENOKOT) 8.6 MG tablet Take 1 tablet by mouth daily. Patient not taking: Reported on 02/11/2021    [provider]    Physical Exam: Limited 2/2 AMS and dementia Vitals:   06/24/21 1416 06/24/21 1420 06/24/21 1430 06/24/21 1510  BP:  107/87 (!) 84/72 119/68  Pulse:  (!) 51 (!) 50 (!) 48  Resp:   (!) 23 14  Temp:   99 F (37.2 C)   TempSrc:   Rectal   SpO2:  (!) 88% 95% 95%  Weight: 55.5 kg     Height: 5\' 5"  (1.651 m)       Constitutional: NAD, calm, comfortable Vitals:   06/24/21 1416 06/24/21 1420 06/24/21 1430 06/24/21 1510  BP:  107/87 (!) 84/72 119/68  Pulse:  (!) 51 (!) 50 (!) 48  Resp:   (!) 23 14  Temp:   99 F (37.2 C)   TempSrc:   Rectal   SpO2:  (!) 88% 95% 95%  Weight: 55.5 kg     Height: 5\' 5"  (1.651 m)      Eyes: Unable to examine, purposefully squeezing lids shut ENMT: Refused oral exam   Respiratory: clear to auscultation bilaterally, no wheezing, no crackles. Normal respiratory effort. No accessory muscle use.  Cardiovascular: Regular rate and rhythm, no murmurs / rubs / gallops. No extremity edema. 2+ pedal pulses.  Abdomen: Appears full /firm, unsure if purposeful guarding,  equivocal tenderness, no masses palpated. No hepatosplenomegaly. Bowel sounds positive.  Musculoskeletal: no clubbing / cyanosis. 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  Skin: no rashes, lesions, ulcers. No induration Neurologic: Limited exam, able to move all extremities, squeeze my fingers to some  extent bilaterally , otherwise resisting movement of his lower extremities or further exams Psychiatric: Normal judgment and insight. Alert and oriented x 3. Normal mood.   Labs on Admission: I have personally reviewed following labs and imaging studies  CBC: Recent Labs  Lab 06/24/21 1426  WBC 10.3  NEUTROABS 5.2  HGB 17.7*  HCT 52.8*  MCV 89.3  PLT 194   Basic Metabolic Panel: Recent Labs  Lab 06/24/21 1426  NA 140  K 3.8  CL 108  CO2 21*  GLUCOSE 202*  BUN 19  CREATININE 1.60*  CALCIUM 9.4   GFR: Estimated Creatinine Clearance: 25.5 mL/min (A) (by C-G formula based on SCr  of 1.6 mg/dL (H)). Liver Function Tests: Recent Labs  Lab 06/24/21 1426  AST 23  ALT 11  ALKPHOS 103  BILITOT 1.0  PROT 8.2*  ALBUMIN 4.0   CBG: Recent Labs  Lab 06/24/21 1415  GLUCAP 206*    Radiological Exams on Admission: DG Chest 1 View  Result Date: 06/24/2021 CLINICAL DATA:  Altered mental status. EXAM: CHEST  1 VIEW COMPARISON:  Chest x-ray 02/10/2021. FINDINGS: The aorta is tortuous and the heart is enlarged, unchanged. Lung volumes are low. There is strandy atelectasis in the lung bases. There is no lung consolidation, pleural effusion or pneumothorax. No acute fractures are seen. There are surgical clips in the right abdomen. IMPRESSION: 1. Low lung volumes with bibasilar atelectasis. Electronically Signed   By: Darliss Cheney M.D.   On: 06/24/2021 15:11   CT Head Wo Contrast  Result Date: 06/24/2021 CLINICAL DATA:  Altered mental status. EXAM: CT HEAD WITHOUT CONTRAST TECHNIQUE: Contiguous axial images were obtained from the base of the skull through the vertex without intravenous contrast. COMPARISON:  CT head dated Feb 10, 2021. FINDINGS: Brain: No evidence of acute infarction, hemorrhage, hydrocephalus, extra-axial collection or mass lesion/mass effect. Stable atrophy and chronic microvascular ischemic changes. Vascular: Atherosclerotic vascular calcification of the carotid  siphons. No hyperdense vessel. Skull: Normal. Negative for fracture or focal lesion. Sinuses/Orbits: No acute finding. Other: None. IMPRESSION: 1. No acute intracranial abnormality. 2. Stable atrophy and chronic microvascular ischemic changes. Electronically Signed   By: Obie Dredge M.D.   On: 06/24/2021 15:12    EKG: Independently reviewed. Sinus bradycardia ( Not new ) , PR interval 131. None specific T wave changes to lead v2 - v4.   Assessment/Plan Principal Problem:   Acute metabolic encephalopathy Active Problems:   Dementia without behavioral disturbance (HCC)   CKD (chronic kidney disease), stage III (HCC)   Hypothyroidism   History of DVT (deep vein thrombosis)   Lactic acidosis   Acute on chronic renal insufficiency   Acute metabolic encephalopathy- UA pending, chest clear, at this time no focus of infection identified. Significant and worsening lactic acidosis of 4 > 5 after 1.5 L bolus was completed. Per Nursing home staff- Vomiting prior to unresponsiveness. Blood pressure transiently down to 84 systolic here . He otherwise does not meet sepsis criteria. Head CT unremarkable.  - Similar presentation in May/2022 with AMS, no exact etiology identifies, sepsis was ruled out, but he had significant lactic acidosis that peaked at 6.4 before gradually downtrending. - Give additional bolus totaling 3 L  - Cont N/s 75cc/hr   - Obtain procalcitonin - Continue broad spectrum antibiotics for now- Vanc, cefepime and metronidazole, low threshold to D/c - NPO till improvement in mental status - CT abdomen/pelvis without contrast  AKI on CKD 3B- Mild, cr 1.6, baseline appears to be 1.2 - 1.3. - Hydate  Dementia- Nursing home resident.  Ambulates with wheelchair.  DVT- Per nursing home staff he is no longer on eliquis.  DVT prophylaxis: Lovenox Code Status: DNR- Universal DNR form at .  Family Communication: None at bedside, spoke to both daughters- Ioan Landini and Erlene Senters. They request [name] be called if we want to reach family.- [phone]. Disposition Plan:  ~ 2 days Consults called: None Admission status: Inpt, tele I certify that at the point of admission it is my clinical judgment that the patient will require inpatient hospital care spanning beyond 2 midnights from the point of admission due to high intensity of  service, high risk for further deterioration and high frequency of surveillance required.   Onnie Boer MD [facility]  06/24/2021, 7:06 PM

## 2021-06-24 NOTE — ED Notes (Signed)
Patient transported to CT 

## 2021-06-24 NOTE — ED Triage Notes (Signed)
Pt arrived via EMS. Per [facility] pt LKW 13:15. [facility] stated that pt was seen rolling around facility in wheelchair and then became responsive to only painful stimuli.

## 2021-06-24 NOTE — ED Notes (Signed)
Daughter [name] updated on plan of care.

## 2021-06-24 NOTE — Sepsis Progress Note (Signed)
Sepsis protocol is being followed by [facility]. 

## 2021-06-24 NOTE — Progress Notes (Signed)
Pharmacy Antibiotic Note  Robert Hurst is a 87 y.o. male admitted on 06/24/2021 with  unknown source of infection .  Pharmacy has been consulted for Vancomycin and Cefepime dosing.  Plan: Vancomycin 1000 mg IV every 48 hours. Cefepime 2000 mg IV every 24 hours. Monitor labs, c/s, and vanco level as indicated  Height: 5\' 5"  (165.1 cm) Weight: 55.5 kg (122 lb 5.7 oz) IBW/kg (Calculated) : 61.5  Temp (24hrs), Avg:97.1 F (36.2 C), Min:95.5 F (35.3 C), Max:99 F (37.2 C)  Recent Labs  Lab 06/24/21 1426 06/24/21 1614  WBC 10.3  --   CREATININE 1.60*  --   LATICACIDVEN 4.0* 5.0*    Estimated Creatinine Clearance: 25.5 mL/min (A) (by C-G formula based on SCr of 1.6 mg/dL (H)).    Allergies  Allergen Reactions   Penicillins Other (See Comments)    Unknown reaction    Antimicrobials this admission: Vanco 10/2 >> Cefepime 10/2 >> Flagyl 10/2   Microbiology results: 10/2 BCx: pending   Thank you for allowing pharmacy to be a part of this patient's care.  12/2 06/24/2021 7:09 PM

## 2021-06-24 NOTE — ED Provider Notes (Signed)
[facility] EMERGENCY DEPARTMENT Provider Note  CSN: [id number] Arrival date & time: 06/24/21 1411    History Chief Complaint  Patient presents with   Altered Mental Status    Robert Hurst is a 87 y.o. male with history of dementia brought to the eD via EMS from [facility] where he lives long term. At baseline he is disoriented but alert and rolls around in a wheelchair. Per EMS about 1330hrs today he was seen rolling around in his wheelchair as per usual but then became less responsive a short time later. He has history of similar. No noted weakness of arm or leg enroute BP was low per EMS. Patient is unable to give any history. Level 5 caveat applies.    Past Medical History:  Diagnosis Date   Alzheimer disease (HCC)    Calculus of bile duct (any) with acute cholecystitis    Cognitive communication deficit    COVID-19    Dementia (HCC)    DVT (deep venous thrombosis) (HCC)    Dysphagia    Malnourished (HCC)    Metabolic encephalopathy    Protein calorie malnutrition (HCC)    Renal disorder    Syncope and collapse    Thyroid disease     Past Surgical History:  Procedure Laterality Date   APPENDECTOMY     BALLOON DILATION N/A 01/15/2018   Procedure: BALLOON DILATION;  Surgeon: Malissa Hippo, MD;  [facility];  Service: Endoscopy;  Laterality: N/A;   ERCP N/A 01/15/2018   Procedure: ENDOSCOPIC RETROGRADE CHOLANGIOPANCREATOGRAPHY (ERCP);  Surgeon: Malissa Hippo, MD;  [facility];  Service: Endoscopy;  Laterality: N/A;   REMOVAL OF STONES N/A 01/15/2018   Procedure: REMOVAL OF STONES;  Surgeon: Malissa Hippo, MD;  [facility];  Service: Endoscopy;  Laterality: N/A;   SPHINCTEROTOMY N/A 01/15/2018   Procedure: SPHINCTEROTOMY;  Surgeon: Malissa Hippo, MD;  [facility];  Service: Endoscopy;  Laterality: N/A;   TONSILLECTOMY      No family history on file.  Social History   Tobacco Use   Smoking status: Never    Smokeless tobacco: Never  Vaping Use   Vaping Use: Unknown  Substance Use Topics   Alcohol use: Not Currently   Drug use: Not Currently     Home Medications Prior to Admission medications   Medication Sig Start Date End Date Taking? Authorizing Provider  acetaminophen (TYLENOL) 325 MG tablet Take 2 tablets (650 mg total) by mouth every 6 (six) hours as needed for mild pain, fever or headache (fever >/= 101). 05/26/20   Johnson, Clanford L, MD  apixaban (ELIQUIS) 2.5 MG TABS tablet Take 1 tablet (2.5 mg total) by mouth 2 (two) times daily. Patient not taking: Reported on 02/11/2021 08/17/19 04/30/21  Maurilio Lovely D, DO  feeding supplement (ENSURE ENLIVE / ENSURE PLUS) LIQD Take 237 mLs by mouth 3 (three) times daily. 02/13/21   Shon Hale, MD  levothyroxine (SYNTHROID) 50 MCG tablet Take 1 tablet (50 mcg total) by mouth daily at 6 (six) AM. Patient not taking: Reported on 02/11/2021 05/05/20   Catarina Hartshorn, MD  memantine (NAMENDA) 10 MG tablet Take 10 mg by mouth 2 (two) times daily. Patient not taking: Reported on 02/11/2021    [provider]  ondansetron (ZOFRAN) 4 MG tablet Take 1 tablet (4 mg total) by mouth every 6 (six) hours as needed for nausea. 02/13/21   Shon Hale, MD  polyethylene glycol (MIRALAX / Ethelene Hal)  17 g packet Take 17 g by mouth daily. Patient not taking: Reported on 02/11/2021    [provider]  Potassium Chloride ER 20 MEQ TBCR Take 20 mEq by mouth daily. 1 tab daily by mouth 02/13/21   Shon Hale, MD  senna (SENOKOT) 8.6 MG tablet Take 1 tablet by mouth daily. Patient not taking: Reported on 02/11/2021    [provider]     Allergies    Penicillins   Review of Systems   Review of Systems Unable to assess due to mental status.    Physical Exam BP 119/68   Pulse (!) 48   Temp 99 F (37.2 C) (Rectal)   Resp 14   Ht 5\' 5"  (1.651 m)   Wt 55.5 kg   SpO2 95%   BMI 20.36 kg/m   Physical Exam Vitals and nursing note  reviewed.  Constitutional:      Appearance: Normal appearance. He is diaphoretic.     Comments: cachectic  HENT:     Head: Normocephalic and atraumatic.     Nose: Nose normal.     Mouth/Throat:     Mouth: Mucous membranes are dry.  Eyes:     Extraocular Movements: Extraocular movements intact.     Conjunctiva/sclera: Conjunctivae normal.  Cardiovascular:     Rate and Rhythm: Bradycardia present.  Pulmonary:     Effort: Pulmonary effort is normal. No respiratory distress.     Breath sounds: Normal breath sounds. No wheezing.  Abdominal:     General: Abdomen is flat.     Palpations: Abdomen is soft.     Tenderness: There is abdominal tenderness (grimaces with suprapubic palpation).  Musculoskeletal:        General: No swelling or tenderness. Normal range of motion.     Cervical back: Neck supple.  Skin:    General: Skin is warm.  Neurological:     Comments: Unresponsive to verbal stimuli, grimaces to pain, no facial droop. Moves all extremities  Psychiatric:     Comments: Unable to assess     ED Results / Procedures / Treatments   Labs (all labs ordered are listed, but only abnormal results are displayed) Labs Reviewed  COMPREHENSIVE METABOLIC PANEL - Abnormal; Notable for the following components:      Result Value   CO2 21 (*)    Glucose, Bld 202 (*)    Creatinine, Ser 1.60 (*)    Total Protein 8.2 (*)    GFR, Estimated 41 (*)    All other components within normal limits  LACTIC ACID, PLASMA - Abnormal; Notable for the following components:   Lactic Acid, Venous 4.0 (*)    All other components within normal limits  CBC WITH DIFFERENTIAL/PLATELET - Abnormal; Notable for the following components:   RBC 5.91 (*)    Hemoglobin 17.7 (*)    HCT 52.8 (*)    Monocytes Absolute 1.1 (*)    All other components within normal limits  CBG MONITORING, ED - Abnormal; Notable for the following components:   Glucose-Capillary 206 (*)    All other components within normal limits   TROPONIN I (HIGH SENSITIVITY) - Abnormal; Notable for the following components:   Troponin I (High Sensitivity) 20 (*)    All other components within normal limits  CULTURE, BLOOD (ROUTINE X 2)  CULTURE, BLOOD (ROUTINE X 2)  RESP PANEL BY RT-PCR (FLU A&B, COVID) ARPGX2  LACTIC ACID, PLASMA  URINALYSIS, ROUTINE W REFLEX MICROSCOPIC    EKG EKG Interpretation  Date/Time:  Sunday June 24 2021 14:29:41 EDT Ventricular Rate:  47 PR Interval:  131 QRS Duration: 90 QT Interval:  487 QTC Calculation: 431 R Axis:   14 Text Interpretation: Sinus bradycardia Abnormal R-wave progression, early transition No significant change since last tracing EARLIER SAME DATE Confirmed by Susy Frizzle [phone]) on 06/24/2021 2:38:13 PM  Radiology DG Chest 1 View  Result Date: 06/24/2021 CLINICAL DATA:  Altered mental status. EXAM: CHEST  1 VIEW COMPARISON:  Chest x-ray 02/10/2021. FINDINGS: The aorta is tortuous and the heart is enlarged, unchanged. Lung volumes are low. There is strandy atelectasis in the lung bases. There is no lung consolidation, pleural effusion or pneumothorax. No acute fractures are seen. There are surgical clips in the right abdomen. IMPRESSION: 1. Low lung volumes with bibasilar atelectasis. Electronically Signed   By: Darliss Cheney M.D.   On: 06/24/2021 15:11   CT Head Wo Contrast  Result Date: 06/24/2021 CLINICAL DATA:  Altered mental status. EXAM: CT HEAD WITHOUT CONTRAST TECHNIQUE: Contiguous axial images were obtained from the base of the skull through the vertex without intravenous contrast. COMPARISON:  CT head dated Feb 10, 2021. FINDINGS: Brain: No evidence of acute infarction, hemorrhage, hydrocephalus, extra-axial collection or mass lesion/mass effect. Stable atrophy and chronic microvascular ischemic changes. Vascular: Atherosclerotic vascular calcification of the carotid siphons. No hyperdense vessel. Skull: Normal. Negative for fracture or focal lesion. Sinuses/Orbits:  No acute finding. Other: None. IMPRESSION: 1. No acute intracranial abnormality. 2. Stable atrophy and chronic microvascular ischemic changes. Electronically Signed   By: Obie Dredge M.D.   On: 06/24/2021 15:12    Procedures Procedures  Medications Ordered in the ED Medications  metroNIDAZOLE (FLAGYL) IVPB 500 mg (has no administration in time range)  vancomycin (VANCOCIN) IVPB 1000 mg/200 mL premix (has no administration in time range)  ceFEPIme (MAXIPIME) 2 g in sodium chloride 0.9 % 100 mL IVPB (2 g Intravenous New Bag/Given 06/24/21 1541)  lactated ringers bolus 1,000 mL (1,000 mLs Intravenous New Bag/Given 06/24/21 1425)  lactated ringers bolus 1,000 mL (1,000 mLs Intravenous New Bag/Given 06/24/21 1537)     MDM Rules/Calculators/A&P MDM  Patient with AMS of unclear etiology, has had numerous admissions for similar. I was unable to get anyone at [facility] to answer the phone to obtain collateral history. Will check labs, CT head, EKG and give IVF. Not a code stroke given no focal deficits.   ED Course  I have reviewed the triage vital signs and the nursing notes.  Pertinent labs & imaging results that were available during my care of the patient were reviewed by me and considered in my medical decision making (see chart for details).  Clinical Course as of 06/24/21 1551  Sun Jun 24, 2021  1515 Family at bedside now confirm he is not at his normal self but that he does this sometimes when he 'doesn't want to be messed with' or when he has a UTI.  [CS]  1516 CT head is unchanged.  [CS]  1530 CMP with increase in Cr from baseline. Lactic acid is elevated, will begin broad spectrum Abx pending identification of source. Recheck after LR bolus.  [CS]  1536 Hospitalist paged for admission [CS]  1548 Spoke with Dr. Wendall Stade, Hospitalist, who will evaluate for admission.  [CS]    Clinical Course User Index [CS] Pollyann Savoy, MD    Final Clinical Impression(s) / ED  Diagnoses Final diagnoses:  Altered mental status, unspecified altered mental status type  Metabolic encephalopathy  Sepsis with encephalopathy without septic shock, due to unspecified organism [facility])    Rx / DC Orders ED Discharge Orders     None        Pollyann Savoy, MD 06/24/21 1551

## 2021-06-25 DIAGNOSIS — N1832 Chronic kidney disease, stage 3b: Secondary | ICD-10-CM | POA: Diagnosis not present

## 2021-06-25 DIAGNOSIS — N289 Disorder of kidney and ureter, unspecified: Secondary | ICD-10-CM | POA: Diagnosis not present

## 2021-06-25 DIAGNOSIS — E039 Hypothyroidism, unspecified: Secondary | ICD-10-CM | POA: Diagnosis not present

## 2021-06-25 DIAGNOSIS — F0390 Unspecified dementia without behavioral disturbance: Secondary | ICD-10-CM | POA: Diagnosis not present

## 2021-06-25 LAB — CBC
HCT: 44.2 % (ref 39.0–52.0)
Hemoglobin: 14.3 g/dL (ref 13.0–17.0)
MCH: 29.8 pg (ref 26.0–34.0)
MCHC: 32.4 g/dL (ref 30.0–36.0)
MCV: 92.1 fL (ref 80.0–100.0)
Platelets: 123 10*3/uL — ABNORMAL LOW (ref 150–400)
RBC: 4.8 MIL/uL (ref 4.22–5.81)
RDW: 14.5 % (ref 11.5–15.5)
WBC: 7.2 10*3/uL (ref 4.0–10.5)
nRBC: 0 % (ref 0.0–0.2)

## 2021-06-25 LAB — BASIC METABOLIC PANEL
Anion gap: 9 (ref 5–15)
BUN: 16 mg/dL (ref 8–23)
CO2: 26 mmol/L (ref 22–32)
Calcium: 9 mg/dL (ref 8.9–10.3)
Chloride: 115 mmol/L — ABNORMAL HIGH (ref 98–111)
Creatinine, Ser: 1.4 mg/dL — ABNORMAL HIGH (ref 0.61–1.24)
GFR, Estimated: 49 mL/min — ABNORMAL LOW (ref >60)
Glucose, Bld: 101 mg/dL — ABNORMAL HIGH (ref 70–99)
Potassium: 4.9 mmol/L (ref 3.5–5.1)
Sodium: 150 mmol/L — ABNORMAL HIGH (ref 135–145)

## 2021-06-25 LAB — LACTIC ACID, PLASMA
Lactic Acid, Venous: 5.7 mmol/L (ref 0.5–1.9)
Lactic Acid, Venous: 1.9 mmol/L (ref 0.5–1.9)
Lactic Acid, Venous: 1.9 mmol/L (ref 0.5–1.9)

## 2021-06-25 LAB — CORTISOL: Cortisol, Plasma: 11.8 ug/dL

## 2021-06-25 LAB — AMMONIA: Ammonia: 10 umol/L (ref 9–35)

## 2021-06-25 LAB — VITAMIN B12: Vitamin B-12: 170 pg/mL — ABNORMAL LOW (ref 180–914)

## 2021-06-25 LAB — TSH: TSH: 4.143 u[IU]/mL (ref 0.350–4.500)

## 2021-06-25 LAB — MRSA NEXT GEN BY PCR, NASAL: MRSA by PCR Next Gen: DETECTED — AB

## 2021-06-25 MED ORDER — CHLORHEXIDINE GLUCONATE CLOTH 2 % EX PADS
6.0000 | MEDICATED_PAD | Freq: Every day | CUTANEOUS | Status: DC
  Administered 2021-06-26: 6 via TOPICAL

## 2021-06-25 MED ORDER — DEXTROSE-NACL 5-0.45 % IV SOLN: INTRAVENOUS | Status: DC

## 2021-06-25 MED ORDER — MUPIROCIN 2 % EX OINT
1.0000 "application " | TOPICAL_OINTMENT | Freq: Two times a day (BID) | CUTANEOUS | Status: DC
  Administered 2021-06-25 – 2021-06-27 (×5): 1 via NASAL
  Filled 2021-06-25: qty 22

## 2021-06-25 MED ORDER — CYANOCOBALAMIN 1000 MCG/ML IJ SOLN
1000.0000 ug | Freq: Every day | INTRAMUSCULAR | Status: DC
  Administered 2021-06-25 – 2021-06-27 (×3): 1000 ug via INTRAMUSCULAR
  Filled 2021-06-25 (×3): qty 1

## 2021-06-25 NOTE — [place] FL2 (Signed)
[place] MEDICAID FL2 LEVEL OF CARE SCREENING TOOL     IDENTIFICATION  Patient Name: Robert Hurst Birthdate: 07-26-1932 Sex: male Admission Date (Current Location): 06/24/2021  [facility] and [place] Number:  [employer] and Address:  [facility],  [address] [place] [address]      Provider Number: [id number]  Attending Physician Name and Address:  Vassie Loll, MD  Relative Name and Phone Number:       Current Level of Care: Hospital Recommended Level of Care: Skilled Nursing Facility Prior Approval Number:    Date Approved/Denied:   PASRR Number:    Discharge Plan: SNF    Current Diagnoses: Patient Active Problem List   Diagnosis Date Noted   Severe sepsis (HCC) 02/10/2021   COVID-19    Advanced care planning/counseling discussion    Palliative care by specialist    Sepsis due to undetermined organism (HCC) 05/22/2020   Hypotension due to hypovolemia    Thrombocytopenia (HCC) 05/03/2020   Sepsis due to Enterococcus (HCC) 05/03/2020   Acute on chronic renal insufficiency    UTI (urinary tract infection) 04/30/2020   CKD (chronic kidney disease), stage III (HCC) 02/12/2020   Hypothyroidism 02/12/2020   History of DVT (deep vein thrombosis)    History of COVID-19    Acute cystitis without hematuria    Goals of care, counseling/discussion    Palliative care encounter    Acute metabolic encephalopathy 08/11/2019   Syncope and collapse 08/11/2019   Syncope    Dementia without behavioral disturbance (HCC) 08/08/2019   Acute renal failure superimposed on stage 3b chronic kidney disease (HCC) 08/08/2019   Lactic acidosis 08/08/2019   Incontinence 08/08/2019    Orientation RESPIRATION BLADDER Height & Weight     Self  Normal Incontinent Weight: 122 lb 5.7 oz (55.5 kg) Height:  5\' 5"  (165.1 cm)  BEHAVIORAL SYMPTOMS/MOOD NEUROLOGICAL BOWEL NUTRITION STATUS      Incontinent Diet (see dc summary)  AMBULATORY STATUS COMMUNICATION OF  NEEDS Skin   Extensive Assist Verbally Normal                       Personal Care Assistance Level of Assistance  Bathing, Feeding, Dressing Bathing Assistance: Maximum assistance Feeding assistance: Limited assistance Dressing Assistance: Maximum assistance     Functional Limitations Info  Sight, Hearing, Speech Sight Info: Adequate Hearing Info: Adequate Speech Info: Adequate    SPECIAL CARE FACTORS FREQUENCY                       Contractures Contractures Info: Not present    Additional Factors Info  Code Status, Allergies Code Status Info: DNR Allergies Info: Penicillins           Current Medications (06/25/2021):  This is the current hospital active medication list Current Facility-Administered Medications  Medication Dose Route Frequency Provider Last Rate Last Admin   acetaminophen (TYLENOL) tablet 650 mg  650 mg Oral Q6H PRN Emokpae, Ejiroghene E, MD       Or   acetaminophen (TYLENOL) suppository 650 mg  650 mg Rectal Q6H PRN Emokpae, Ejiroghene E, MD       ceFEPIme (MAXIPIME) 2 g in sodium chloride 0.9 % 100 mL IVPB  2 g Intravenous Q24H Emokpae, Ejiroghene E, MD   Stopped at 06/24/21 1618   dextrose 5 %-0.45 % sodium chloride infusion   Intravenous Continuous 08/24/21, MD 100 mL/hr at 06/25/21 0859 New Bag at 06/25/21 [phone]  enoxaparin (LOVENOX) injection 30 mg  30 mg Subcutaneous Q24H Emokpae, Ejiroghene E, MD   30 mg at 06/24/21 2121   metroNIDAZOLE (FLAGYL) IVPB 500 mg  500 mg Intravenous Q12H Emokpae, Ejiroghene E, MD 100 mL/hr at 06/25/21 0321 500 mg at 06/25/21 0321   ondansetron (ZOFRAN) tablet 4 mg  4 mg Oral Q6H PRN Emokpae, Ejiroghene E, MD       Or   ondansetron (ZOFRAN) injection 4 mg  4 mg Intravenous Q6H PRN Emokpae, Ejiroghene E, MD       polyethylene glycol (MIRALAX / GLYCOLAX) packet 17 g  17 g Oral Daily PRN Emokpae, Ejiroghene E, MD       [START ON 06/26/2021] vancomycin (VANCOCIN) IVPB 1000 mg/200 mL premix  1,000 mg  Intravenous Q48H Emokpae, Ejiroghene E, MD         Discharge Medications: Please see discharge summary for a list of discharge medications.  Relevant Imaging Results:  Relevant Lab Results:   Additional Information    Elliot Gault, LCSW

## 2021-06-25 NOTE — TOC Initial Note (Signed)
[facility]) - Initial/Assessment Note    Patient Details  Name: Robert Hurst MRN: [id number] Date of Birth: 1931/09/26  Transition of Care [facility]) CM/SW Contact:    Elliot Gault, LCSW Phone Number: 06/25/2021, 10:27 AM  Clinical Narrative:                  Pt admitted from [facility] LTC unit. Per MD, pt will likely remain hospitalized another two days.  Anticipate return to [facility] at dc. TOC will follow.  Expected Discharge Plan: Long Term Nursing Home Barriers to Discharge: Continued Medical Work up   Patient Goals and CMS Choice Patient states their goals for this hospitalization and ongoing recovery are:: return to [facility]      Expected Discharge Plan and Services Expected Discharge Plan: Long Term Nursing Home In-house Referral: Clinical Social Work   Post Acute Care Choice: Resumption of Svcs/PTA Provider Living arrangements for the past 2 months: Skilled Nursing Facility                                      Prior Living Arrangements/Services Living arrangements for the past 2 months: Skilled Nursing Facility Lives with:: Facility Resident Patient language and need for interpreter reviewed:: Yes Do you feel safe going back to the place where you live?: Yes      Need for Family Participation in Patient Care: No (Comment) Care giver support system in place?: Yes (comment)   Criminal Activity/Legal Involvement Pertinent to Current Situation/Hospitalization: No - Comment as needed  Activities of Daily Living   ADL Screening (condition at time of admission) Patient's cognitive ability adequate to safely complete daily activities?: No Does the patient have difficulty concentrating, remembering, or making decisions?: Yes  Permission Sought/Granted                  Emotional Assessment       Orientation: : Oriented to Self Alcohol / Substance Use: Not Applicable Psych Involvement: No (comment)  Admission diagnosis:   Metabolic encephalopathy [G93.41] Altered mental status, unspecified altered mental status type [R41.82] AMS (altered mental status) [R41.82] Sepsis with encephalopathy without septic shock, due to unspecified organism (HCC) [A41.9, R65.20, G93.40] Acute metabolic encephalopathy [G93.41] Patient Active Problem List   Diagnosis Date Noted   Severe sepsis (HCC) 02/10/2021   COVID-19    Advanced care planning/counseling discussion    Palliative care by specialist    Sepsis due to undetermined organism (HCC) 05/22/2020   Hypotension due to hypovolemia    Thrombocytopenia (HCC) 05/03/2020   Sepsis due to Enterococcus (HCC) 05/03/2020   Acute on chronic renal insufficiency    UTI (urinary tract infection) 04/30/2020   CKD (chronic kidney disease), stage III (HCC) 02/12/2020   Hypothyroidism 02/12/2020   History of DVT (deep vein thrombosis)    History of COVID-19    Acute cystitis without hematuria    Goals of care, counseling/discussion    Palliative care encounter    Acute metabolic encephalopathy 08/11/2019   Syncope and collapse 08/11/2019   Syncope    Dementia without behavioral disturbance (HCC) 08/08/2019   Acute renal failure superimposed on stage 3b chronic kidney disease (HCC) 08/08/2019   Lactic acidosis 08/08/2019   Incontinence 08/08/2019   PCP:  [name], MD Pharmacy:   [address] [place], [place] - [address] [place] [address] Phone: [phone] Fax:  [phone]     Social Determinants of Health (SDOH) Interventions    Readmission Risk Interventions Readmission Risk Prevention Plan 06/25/2021 02/12/2021 05/25/2020  Medication Screening Complete - -  Transportation Screening Complete Complete Complete  PCP or Specialist Appt within 5-7 Days - Complete -  PCP or Specialist Appt within 3-5 Days - - Not Complete  Not Complete comments - - Patient is from [facility]. He is seen by the facility medical  director.  Home Care Screening - Complete -  Medication Review (RN CM) - Complete -  Social Work Consult for Recovery Care Planning/Counseling - - Complete  Palliative Care Screening - - Complete  Medication Review Oceanographer) - - Complete  Some recent data might be hidden

## 2021-06-25 NOTE — Progress Notes (Signed)
Lab called and reported positive MRSA + screen.  Initiated MRSA positive standing orders.

## 2021-06-25 NOTE — Progress Notes (Signed)
PROGRESS NOTE    Robert Hurst  WGN:[id number] DOB: May 07, 1932 DOA: 06/24/2021 PCP: [name], MD    Chief Complaint  Patient presents with   Altered Mental Status    Brief admission narrative:  As per H&P written by Dr. Mariea Clonts on 06/24/2021 Robert Hurst is a 87 y.o. male with medical history significant for  DVT, Dementia, CKD3b. Presented to the ED from [facility] with reports of change in mental status. On my evaluation patient is purposely closing his eyes, resisting exam, and not talking. I called staff at nursing home, talked to [facility], she says at baseline he is alert and oriented, patient can walk from his bed to wheel chair, will talk when he wants but not much, and he is sometimes combative. He was wheeling himself around today, no cough, difficulty breathing diarrhea or other symptoms noted. Then patient vomited, became cold and clammy and unresponsive, responding only to pain.  CNA was with patient when he vomited, they dont think he aspirated. They were unable to measure his blood pressure and o2 sat was 95 % on room air.   Talked to daughters, they think their father is in pain, and this is unusual and are concerned for kidney stones.   ED Course: Temp 99, heart rate 48 - 51, blood pressure 84 - 119, sats 95% on room air. Chest xray and Head Ct both negative for acute abnormality. Lactic acid- 4. UA pending. 2 L bolus given.  Started on broad spectrum antibiotics Vanc, metronidazole and cefepime.  Assessment & Plan: 1-acute metabolic encephalopathy -In the setting of severe dehydration and ongoing lactic acidosis -Patient was found unresponsive at a skilled nursing facility where he resides with noticeable low blood pressure. -Vital signs has stabilized after fluid resuscitation -Lactic acid is still elevated. -So far no clear source of infection appreciated; empirically cover with broad-spectrum antibiotics. -Follow procalcitonin. -Patient currently demonstrating some  improvement and diet will be advanced to clear liquid. -We will also check TSH, ammonia and B12.  2-Dementia without behavioral disturbance (HCC) -Continue supportive care -At baseline patient is nursing home resident and ambulates with wheelchair.  3-Lactic acidosis -Lactic acid 5.7 -Continue fluid resuscitation and follow lactic acid level.  4-acute kidney injury on CKD (chronic kidney disease), stage IIIb (HCC) -In the setting of prerenal azotemia and dehydration most likely. -Minimize nephrotoxic agents, hypotension and contrast. -Continue fluid resuscitation and follow renal function trend. -Creatinine baseline 1.2-1.3; creatinine up to 1.6 on admission.  5-Hypothyroidism -Repeat TSH -Continue Synthroid.   DVT prophylaxis: Lovenox Code Status: DNR Family Communication: No family at bedside Disposition:   Status is: Inpatient  Remains inpatient appropriate because:IV treatments appropriate due to intensity of illness or inability to take PO  Dispo: The patient is from: SNF              Anticipated d/c is to: SNF              Patient currently is not medically stable to d/c.   Difficult to place patient No       Consultants:  None  Procedures:  See below for x-ray reports.  Antimicrobials:  Cefepime and Flagyl   Subjective: No fever, no chest pain, no nausea vomiting.  Patient is pleasantly confused and demonstrating poor insight; unknown baseline.  Lactic acid continues to be elevated.  Objective: Vitals:   06/25/21 0530 06/25/21 0815 06/25/21 0819 06/25/21 1511  BP: 127/77 123/76  (!) 127/93  Pulse: (!) 46 (!) 54  (!) 58  Resp: 18 20  20   Temp: 98.3 F (36.8 C)  98.1 F (36.7 C)   TempSrc:   Oral   SpO2: 97% 100%  99%  Weight:      Height:        Intake/Output Summary (Last 24 hours) at 06/25/2021 1732 Last data filed at 06/25/2021 0500 Gross per 24 hour  Intake 1804.85 ml  Output 700 ml  Net 1104.85 ml   Filed Weights   06/24/21 1416   Weight: 55.5 kg    Examination:  General exam: Following simple commands, no chest pain, no nausea, no vomiting.  Pleasantly confused and demonstrating poor insight.  No fever, Respiratory system: Good air movement bilaterally, no wheezing, no crackles, good saturation on room air. Cardiovascular system: S1 & S2 heard, RRR. No JVD, murmurs, rubs, gallops or clicks. No pedal edema. Gastrointestinal system: Abdomen is nondistended, soft and nontender. No organomegaly or masses felt. Normal bowel sounds heard. Central nervous system: Limited evaluation in the setting of underlying dementia; no new focal deficits appreciated. Extremities: No edema, no cyanosis or clubbing. Skin: No petechiae. Psychiatry: Judgement and insight appear impaired secondary to dementia.    Data Reviewed: I have personally reviewed following labs and imaging studies  CBC: Recent Labs  Lab 06/24/21 1426 06/25/21 0624  WBC 10.3 7.2  NEUTROABS 5.2  --   HGB 17.7* 14.3  HCT 52.8* 44.2  MCV 89.3 92.1  PLT 194 123*    Basic Metabolic Panel: Recent Labs  Lab 06/24/21 1426 06/25/21 0624  NA 140 150*  K 3.8 4.9  CL 108 115*  CO2 21* 26  GLUCOSE 202* 101*  BUN 19 16  CREATININE 1.60* 1.40*  CALCIUM 9.4 9.0    GFR: Estimated Creatinine Clearance: 29.2 mL/min (A) (by C-G formula based on SCr of 1.4 mg/dL (H)).  Liver Function Tests: Recent Labs  Lab 06/24/21 1426  AST 23  ALT 11  ALKPHOS 103  BILITOT 1.0  PROT 8.2*  ALBUMIN 4.0    CBG: Recent Labs  Lab 06/24/21 1415  GLUCAP 206*     Recent Results (from the past 240 hour(s))  Culture, blood (routine x 2)     Status: None (Preliminary result)   Collection Time: 06/24/21  2:26 PM   Specimen: BLOOD LEFT FOREARM  Result Value Ref Range Status   Specimen Description   Final    BLOOD LEFT FOREARM BOTTLES DRAWN AEROBIC AND ANAEROBIC   Special Requests Blood Culture adequate volume  Final   Culture   Final    NO GROWTH < 24  HOURS Performed at [facility], [address], [place], [place]    Report Status PENDING  Incomplete  Resp Panel by RT-PCR (Flu A&B, Covid) Nasopharyngeal Swab     Status: None   Collection Time: 06/24/21  2:36 PM   Specimen: Nasopharyngeal Swab; Nasopharyngeal(NP) swabs in vial transport medium  Result Value Ref Range Status   SARS Coronavirus 2 by RT PCR NEGATIVE NEGATIVE Final    Comment: (NOTE) SARS-CoV-2 target nucleic acids are NOT DETECTED.  The SARS-CoV-2 RNA is generally detectable in upper respiratory specimens during the acute phase of infection. The lowest concentration of SARS-CoV-2 viral copies this assay can detect is 138 copies/mL. A negative result does not preclude SARS-Cov-2 infection and should not be used as the sole basis for treatment or other patient management decisions. A negative result may occur with  improper specimen collection/handling, submission of specimen other than nasopharyngeal swab, presence of  viral mutation(s) within the areas targeted by this assay, and inadequate number of viral copies(<138 copies/mL). A negative result must be combined with clinical observations, patient history, and epidemiological information. The expected result is Negative.  Fact Sheet for Patients:  BloggerCourse.com  Fact Sheet for Healthcare Providers:  SeriousBroker.it  This test is no t yet approved or cleared by the [place] FDA and  has been authorized for detection and/or diagnosis of SARS-CoV-2 by FDA under an Emergency Use Authorization (EUA). This EUA will remain  in effect (meaning this test can be used) for the duration of the COVID-19 declaration under Section 564(b)(1) of the Act, 21 U.S.C.section 360bbb-3(b)(1), unless the authorization is terminated  or revoked sooner.       Influenza A by PCR NEGATIVE NEGATIVE Final   Influenza B by PCR NEGATIVE NEGATIVE Final    Comment:  (NOTE) The Xpert Xpress SARS-CoV-2/FLU/RSV plus assay is intended as an aid in the diagnosis of influenza from Nasopharyngeal swab specimens and should not be used as a sole basis for treatment. Nasal washings and aspirates are unacceptable for Xpert Xpress SARS-CoV-2/FLU/RSV testing.  Fact Sheet for Patients: BloggerCourse.com  Fact Sheet for Healthcare Providers: SeriousBroker.it  This test is not yet approved or cleared by the [place] FDA and has been authorized for detection and/or diagnosis of SARS-CoV-2 by FDA under an Emergency Use Authorization (EUA). This EUA will remain in effect (meaning this test can be used) for the duration of the COVID-19 declaration under Section 564(b)(1) of the Act, 21 U.S.C. section 360bbb-3(b)(1), unless the authorization is terminated or revoked.  Performed at [facility], [address], [place], [place] [address]   Culture, blood (routine x 2)     Status: None (Preliminary result)   Collection Time: 06/24/21  2:43 PM   Specimen: Right Antecubital; Blood  Result Value Ref Range Status   Specimen Description   Final    RIGHT ANTECUBITAL BOTTLES DRAWN AEROBIC AND ANAEROBIC   Special Requests Blood Culture adequate volume  Final   Culture   Final    NO GROWTH < 24 HOURS Performed at [facility], [address] [place], [place] [address]    Report Status PENDING  Incomplete     Radiology Studies: CT ABDOMEN PELVIS WO CONTRAST  Result Date: 06/24/2021 CLINICAL DATA:  Acute abdominal pain and lactic acidosis EXAM: CT ABDOMEN AND PELVIS WITHOUT CONTRAST TECHNIQUE: Multidetector CT imaging of the abdomen and pelvis was performed following the standard protocol without IV contrast. COMPARISON:  02/11/2018 FINDINGS: Lower chest: Calcified granulomas are noted in the right lower lobe. Mild bibasilar atelectasis is seen without sizable effusion or focal parenchymal nodule. Hepatobiliary:  Gallbladder has been surgically removed. Changes of pneumobilia are noted stable in appearance from the prior exam. The previously seen gallbladder fossa fluid collection has resolved interval. Pancreas: Unremarkable. No pancreatic ductal dilatation or surrounding inflammatory changes. Spleen: Normal in size without focal abnormality. Adrenals/Urinary Tract: Adrenal glands are within normal limits. Kidneys are well visualized bilaterally with staghorn calculus in the right upper pole which measures at least 2.2 cm in greatest dimension. No obstructive changes are seen. Right ureter is within normal limits. Left kidney also demonstrates nonobstructing calculi measuring 12 mm in the midportion of the kidney and 9 mm in the lower pole. The ureter is within normal limits. No obstructive changes are seen. Small Peri ureteral diverticulum is noted on the left. Bladder is otherwise within normal limits. Bilateral renal cysts are noted stable in appearance from  the prior CT examination. Stomach/Bowel: No obstructive or inflammatory changes of the colon are noted. Scattered fecal material is seen throughout the colon. The appendix is not well visualized consistent with the prior surgical history. Small bowel and stomach are within normal limits. Vascular/Lymphatic: Atherosclerotic calcifications of the abdominal aorta are noted. No significant lymphadenopathy is seen. Reproductive: Prostate is prominent with scattered calcifications. Other: No abdominal wall hernia or abnormality. No abdominopelvic ascites. Musculoskeletal: Degenerative changes are noted in the lumbar spine. Bone island is seen within the left iliac wing. IMPRESSION: Bilateral renal cystic change and nonobstructing renal calculi worse on the right than the left. Changes consistent with prior cholecystectomy and evidence of pneumobilia stable from the prior exam. Status post appendectomy. Changes of prior granulomatous disease. Mild bibasilar atelectasis is  noted as well. Electronically Signed   By: Alcide Clever M.D.   On: 06/24/2021 20:53   DG Chest 1 View  Result Date: 06/24/2021 CLINICAL DATA:  Altered mental status. EXAM: CHEST  1 VIEW COMPARISON:  Chest x-ray 02/10/2021. FINDINGS: The aorta is tortuous and the heart is enlarged, unchanged. Lung volumes are low. There is strandy atelectasis in the lung bases. There is no lung consolidation, pleural effusion or pneumothorax. No acute fractures are seen. There are surgical clips in the right abdomen. IMPRESSION: 1. Low lung volumes with bibasilar atelectasis. Electronically Signed   By: Darliss Cheney M.D.   On: 06/24/2021 15:11   CT Head Wo Contrast  Result Date: 06/24/2021 CLINICAL DATA:  Altered mental status. EXAM: CT HEAD WITHOUT CONTRAST TECHNIQUE: Contiguous axial images were obtained from the base of the skull through the vertex without intravenous contrast. COMPARISON:  CT head dated Feb 10, 2021. FINDINGS: Brain: No evidence of acute infarction, hemorrhage, hydrocephalus, extra-axial collection or mass lesion/mass effect. Stable atrophy and chronic microvascular ischemic changes. Vascular: Atherosclerotic vascular calcification of the carotid siphons. No hyperdense vessel. Skull: Normal. Negative for fracture or focal lesion. Sinuses/Orbits: No acute finding. Other: None. IMPRESSION: 1. No acute intracranial abnormality. 2. Stable atrophy and chronic microvascular ischemic changes. Electronically Signed   By: Obie Dredge M.D.   On: 06/24/2021 15:12     Scheduled Meds:  enoxaparin (LOVENOX) injection  30 mg Subcutaneous Q24H   Continuous Infusions:  ceFEPime (MAXIPIME) IV 2 g (06/25/21 1535)   dextrose 5 % and 0.45% NaCl 100 mL/hr at 06/25/21 0859   metronidazole 500 mg (06/25/21 1626)   [START ON 06/26/2021] vancomycin       LOS: 1 day    Time spent: 35 minutes    Vassie Loll, MD [facility]   To contact the attending provider between 7A-7P or the covering provider  during after hours 7P-7A, please log into the web site [email or web address] and access using universal [place] password for that web site. If you do not have the password, please call the hospital operator.  06/25/2021, 5:32 PM

## 2021-06-25 NOTE — Progress Notes (Signed)
Lactic 5.7, up from 5.0  Vitals normal.  Contacted Dr. Gwenlyn Perking

## 2021-06-26 DIAGNOSIS — E039 Hypothyroidism, unspecified: Secondary | ICD-10-CM | POA: Diagnosis not present

## 2021-06-26 DIAGNOSIS — F0390 Unspecified dementia without behavioral disturbance: Secondary | ICD-10-CM | POA: Diagnosis not present

## 2021-06-26 DIAGNOSIS — N289 Disorder of kidney and ureter, unspecified: Secondary | ICD-10-CM | POA: Diagnosis not present

## 2021-06-26 DIAGNOSIS — N1832 Chronic kidney disease, stage 3b: Secondary | ICD-10-CM | POA: Diagnosis not present

## 2021-06-26 LAB — BASIC METABOLIC PANEL
Anion gap: 7 (ref 5–15)
BUN: 13 mg/dL (ref 8–23)
CO2: 24 mmol/L (ref 22–32)
Calcium: 8.1 mg/dL — ABNORMAL LOW (ref 8.9–10.3)
Chloride: 108 mmol/L (ref 98–111)
Creatinine, Ser: 1.26 mg/dL — ABNORMAL HIGH (ref 0.61–1.24)
GFR, Estimated: 55 mL/min — ABNORMAL LOW (ref >60)
Glucose, Bld: 108 mg/dL — ABNORMAL HIGH (ref 70–99)
Potassium: 3.4 mmol/L — ABNORMAL LOW (ref 3.5–5.1)
Sodium: 139 mmol/L (ref 135–145)

## 2021-06-26 LAB — GLUCOSE, CAPILLARY: Glucose-Capillary: 116 mg/dL — ABNORMAL HIGH (ref 70–99)

## 2021-06-26 MED ORDER — ENOXAPARIN SODIUM 40 MG/0.4ML IJ SOSY
40.0000 mg | PREFILLED_SYRINGE | INTRAMUSCULAR | Status: DC
  Administered 2021-06-26 – 2021-06-27 (×2): 40 mg via SUBCUTANEOUS
  Filled 2021-06-26 (×2): qty 0.4

## 2021-06-26 NOTE — Progress Notes (Signed)
Notified MD of MRSA positive.  Standing orders already initiated.  No new orders received.

## 2021-06-26 NOTE — Progress Notes (Signed)
PROGRESS NOTE    Robert Hurst  ALP:[id number] DOB: [birth year] DOA: 06/24/2021 PCP: [name], MD    Chief Complaint  Patient presents with   Altered Mental Status    Brief admission narrative:  As per H&P written by Dr. Mariea Clonts on 06/24/2021 Robert Hurst is a 87 y.o. male with medical history significant for  DVT, Dementia, CKD3b. Presented to the ED from [facility] with reports of change in mental status. On my evaluation patient is purposely closing his eyes, resisting exam, and not talking. I called staff at nursing home, talked to [facility], she says at baseline he is alert and oriented, patient can walk from his bed to wheel chair, will talk when he wants but not much, and he is sometimes combative. He was wheeling himself around today, no cough, difficulty breathing diarrhea or other symptoms noted. Then patient vomited, became cold and clammy and unresponsive, responding only to pain.  CNA was with patient when he vomited, they dont think he aspirated. They were unable to measure his blood pressure and o2 sat was 95 % on room air.   Talked to daughters, they think their father is in pain, and this is unusual and are concerned for kidney stones.   ED Course: Temp 99, heart rate 48 - 51, blood pressure 84 - 119, sats 95% on room air. Chest xray and Head Ct both negative for acute abnormality. Lactic acid- 4. UA pending. 2 L bolus given.  Started on broad spectrum antibiotics Vanc, metronidazole and cefepime.  Assessment & Plan: 1-acute metabolic encephalopathy -In the setting of severe dehydration and ongoing lactic acidosis -Patient was found unresponsive at a skilled nursing facility where he resides with noticeable low blood pressure. -Vital signs has stabilized after fluid resuscitation -Lactic acid now normal -Follow procalcitonin; < .10 without signs of infection currently; will weaned off abx's. -Patient currently demonstrating some improvement and diet will be advanced to clear  liquid. -TSH and cortisol within normal limits. -B12 is low and will start repletion.  2-Dementia without behavioral disturbance (HCC) -Continue supportive care -At baseline patient is nursing home resident and ambulates with wheelchair.  3-Lactic acidosis -Lactic acid as high as 5.7 -After fluid resuscitation back to normal; last check 1.9.  4-acute kidney injury on CKD (chronic kidney disease), stage IIIb (HCC) -In the setting of prerenal azotemia and dehydration most likely. -Minimize nephrotoxic agents, hypotension and contrast. -Continue fluid resuscitation and follow renal function trend. -Creatinine baseline 1.2-1.3; creatinine up to 1.6 on admission. -Improved after fluid resuscitation and essentially back to baseline; creatinine 1.26 currently.  5-Hypothyroidism -Repeat TSH -Continue Synthroid.   DVT prophylaxis: Lovenox Code Status: DNR Family Communication: No family at bedside Disposition:   Status is: Inpatient  Remains inpatient appropriate because:IV treatments appropriate due to intensity of illness or inability to take PO  Dispo: The patient is from: SNF              Anticipated d/c is to: SNF              Patient currently is not medically stable to d/c.   Difficult to place patient No    Consultants:  None  Procedures:  See below for x-ray reports.  Antimicrobials:  Cefepime and Flagyl   Subjective: No fever, no chest pain, no nausea, no vomiting.  Patient mentation getting close back to baseline.  Will like to have diet advanced.  Lactic acid now within normal limits.  Objective: Vitals:   06/26/21 0548 06/26/21 0900  06/26/21 1302 06/26/21 1330  BP: (!) 121/54 115/63 138/64   Pulse: (!) 46 (!) 47 (!) 45   Resp: 19 20 18    Temp: 97.7 F (36.5 C) 98.7 F (37.1 C) 97.9 F (36.6 C)   TempSrc: Oral Oral Oral   SpO2: 96% 100% (!) 88% 100%  Weight:      Height:        Intake/Output Summary (Last 24 hours) at 06/26/2021 1700 Last data  filed at 06/26/2021 0600 Gross per 24 hour  Intake 1802.37 ml  Output 550 ml  Net 1252.37 ml   Filed Weights   06/24/21 1416  Weight: 55.5 kg    Examination: General exam: Oriented x1; pleasantly confused.  Family is present mentation getting close to baseline.  No chest pain, no nausea, no vomiting.  Patient is afebrile. Respiratory system: Clear to auscultation. Respiratory effort normal.  No using accessory muscle.  Good saturation on room air. Cardiovascular system:RRR. No murmurs, rubs, gallops.  No JVD. Gastrointestinal system: Abdomen is nondistended, soft and nontender. No organomegaly or masses felt. Normal bowel sounds heard. Central nervous system: No focal neurologic deficits appreciated on exam.  Evaluation is limited in the setting of underlying dementia. Extremities: No cyanosis or clubbing.  No edema. Skin: No petechiae. Psychiatry: Judgement and insight appear impaired secondary to underlying dementia.   Data Reviewed: I have personally reviewed following labs and imaging studies  CBC: Recent Labs  Lab 06/24/21 1426 06/25/21 0624  WBC 10.3 7.2  NEUTROABS 5.2  --   HGB 17.7* 14.3  HCT 52.8* 44.2  MCV 89.3 92.1  PLT 194 123*    Basic Metabolic Panel: Recent Labs  Lab 06/24/21 1426 06/25/21 0624 06/26/21 0551  NA 140 150* 139  K 3.8 4.9 3.4*  CL 108 115* 108  CO2 21* 26 24  GLUCOSE 202* 101* 108*  BUN 19 16 13   CREATININE 1.60* 1.40* 1.26*  CALCIUM 9.4 9.0 8.1*    GFR: Estimated Creatinine Clearance: 32.4 mL/min (A) (by C-G formula based on SCr of 1.26 mg/dL (H)).  Liver Function Tests: Recent Labs  Lab 06/24/21 1426  AST 23  ALT 11  ALKPHOS 103  BILITOT 1.0  PROT 8.2*  ALBUMIN 4.0    CBG: Recent Labs  Lab 06/24/21 1415 06/26/21 0316  GLUCAP 206* 116*     Recent Results (from the past 240 hour(s))  Culture, blood (routine x 2)     Status: None (Preliminary result)   Collection Time: 06/24/21  2:26 PM   Specimen: BLOOD LEFT  FOREARM  Result Value Ref Range Status   Specimen Description   Final    BLOOD LEFT FOREARM BOTTLES DRAWN AEROBIC AND ANAEROBIC   Special Requests Blood Culture adequate volume  Final   Culture   Final    NO GROWTH 2 DAYS Performed at [facility], [address], [place], [address] [place]    Report Status PENDING  Incomplete  Resp Panel by RT-PCR (Flu A&B, Covid) Nasopharyngeal Swab     Status: None   Collection Time: 06/24/21  2:36 PM   Specimen: Nasopharyngeal Swab; Nasopharyngeal(NP) swabs in vial transport medium  Result Value Ref Range Status   SARS Coronavirus 2 by RT PCR NEGATIVE NEGATIVE Final    Comment: (NOTE) SARS-CoV-2 target nucleic acids are NOT DETECTED.  The SARS-CoV-2 RNA is generally detectable in upper respiratory specimens during the acute phase of infection. The lowest concentration of SARS-CoV-2 viral copies this assay can detect is 138 copies/mL. A  negative result does not preclude SARS-Cov-2 infection and should not be used as the sole basis for treatment or other patient management decisions. A negative result may occur with  improper specimen collection/handling, submission of specimen other than nasopharyngeal swab, presence of viral mutation(s) within the areas targeted by this assay, and inadequate number of viral copies(<138 copies/mL). A negative result must be combined with clinical observations, patient history, and epidemiological information. The expected result is Negative.  Fact Sheet for Patients:  BloggerCourse.com  Fact Sheet for Healthcare Providers:  SeriousBroker.it  This test is no t yet approved or cleared by the [place] FDA and  has been authorized for detection and/or diagnosis of SARS-CoV-2 by FDA under an Emergency Use Authorization (EUA). This EUA will remain  in effect (meaning this test can be used) for the duration of the COVID-19 declaration under Section 564(b)(1) of  the Act, 21 U.S.C.section 360bbb-3(b)(1), unless the authorization is terminated  or revoked sooner.       Influenza A by PCR NEGATIVE NEGATIVE Final   Influenza B by PCR NEGATIVE NEGATIVE Final    Comment: (NOTE) The Xpert Xpress SARS-CoV-2/FLU/RSV plus assay is intended as an aid in the diagnosis of influenza from Nasopharyngeal swab specimens and should not be used as a sole basis for treatment. Nasal washings and aspirates are unacceptable for Xpert Xpress SARS-CoV-2/FLU/RSV testing.  Fact Sheet for Patients: BloggerCourse.com  Fact Sheet for Healthcare Providers: SeriousBroker.it  This test is not yet approved or cleared by the [place] FDA and has been authorized for detection and/or diagnosis of SARS-CoV-2 by FDA under an Emergency Use Authorization (EUA). This EUA will remain in effect (meaning this test can be used) for the duration of the COVID-19 declaration under Section 564(b)(1) of the Act, 21 U.S.C. section 360bbb-3(b)(1), unless the authorization is terminated or revoked.  Performed at [facility], [address], [place], [place] [address]   Culture, blood (routine x 2)     Status: None (Preliminary result)   Collection Time: 06/24/21  2:43 PM   Specimen: Right Antecubital; Blood  Result Value Ref Range Status   Specimen Description   Final    RIGHT ANTECUBITAL BOTTLES DRAWN AEROBIC AND ANAEROBIC   Special Requests Blood Culture adequate volume  Final   Culture   Final    NO GROWTH 2 DAYS Performed at [facility], [address] [place], [place] [address]    Report Status PENDING  Incomplete  MRSA Next Gen by PCR, Nasal     Status: Abnormal   Collection Time: 06/25/21  5:43 PM   Specimen: Nasal Mucosa; Nasal Swab  Result Value Ref Range Status   MRSA by PCR Next Gen DETECTED (A) NOT DETECTED Final    Comment: RESULT CALLED TO, READ BACK BY AND VERIFIED WITH: Edgar Frisk RN,2022,06/25/2021,SELF  S (NOTE) The GeneXpert MRSA Assay (FDA approved for NASAL specimens only), is one component of a comprehensive MRSA colonization surveillance program. It is not intended to diagnose MRSA infection nor to guide or monitor treatment for MRSA infections. Test performance is not FDA approved in patients less than 41 years old. Performed at [facility], [address] [place], [place] [address]      Radiology Studies: CT ABDOMEN PELVIS WO CONTRAST  Result Date: 06/24/2021 CLINICAL DATA:  Acute abdominal pain and lactic acidosis EXAM: CT ABDOMEN AND PELVIS WITHOUT CONTRAST TECHNIQUE: Multidetector CT imaging of the abdomen and pelvis was performed following the standard protocol without IV contrast. COMPARISON:  02/11/2018  FINDINGS: Lower chest: Calcified granulomas are noted in the right lower lobe. Mild bibasilar atelectasis is seen without sizable effusion or focal parenchymal nodule. Hepatobiliary: Gallbladder has been surgically removed. Changes of pneumobilia are noted stable in appearance from the prior exam. The previously seen gallbladder fossa fluid collection has resolved interval. Pancreas: Unremarkable. No pancreatic ductal dilatation or surrounding inflammatory changes. Spleen: Normal in size without focal abnormality. Adrenals/Urinary Tract: Adrenal glands are within normal limits. Kidneys are well visualized bilaterally with staghorn calculus in the right upper pole which measures at least 2.2 cm in greatest dimension. No obstructive changes are seen. Right ureter is within normal limits. Left kidney also demonstrates nonobstructing calculi measuring 12 mm in the midportion of the kidney and 9 mm in the lower pole. The ureter is within normal limits. No obstructive changes are seen. Small Peri ureteral diverticulum is noted on the left. Bladder is otherwise within normal limits. Bilateral renal cysts are noted stable in appearance from the prior CT examination. Stomach/Bowel: No obstructive  or inflammatory changes of the colon are noted. Scattered fecal material is seen throughout the colon. The appendix is not well visualized consistent with the prior surgical history. Small bowel and stomach are within normal limits. Vascular/Lymphatic: Atherosclerotic calcifications of the abdominal aorta are noted. No significant lymphadenopathy is seen. Reproductive: Prostate is prominent with scattered calcifications. Other: No abdominal wall hernia or abnormality. No abdominopelvic ascites. Musculoskeletal: Degenerative changes are noted in the lumbar spine. Bone island is seen within the left iliac wing. IMPRESSION: Bilateral renal cystic change and nonobstructing renal calculi worse on the right than the left. Changes consistent with prior cholecystectomy and evidence of pneumobilia stable from the prior exam. Status post appendectomy. Changes of prior granulomatous disease. Mild bibasilar atelectasis is noted as well. Electronically Signed   By: Alcide Clever M.D.   On: 06/24/2021 20:53     Scheduled Meds:  Chlorhexidine Gluconate Cloth  6 each Topical Q0600   cyanocobalamin  1,000 mcg Intramuscular Daily   enoxaparin (LOVENOX) injection  40 mg Subcutaneous Q24H   mupirocin ointment  1 application Nasal BID   Continuous Infusions:  ceFEPime (MAXIPIME) IV 2 g (06/26/21 1556)   dextrose 5 % and 0.45% NaCl 100 mL/hr at 06/26/21 0907   metronidazole 500 mg (06/26/21 1656)     LOS: 2 days    Time spent: 30 minutes    Vassie Loll, MD [facility]   To contact the attending provider between 7A-7P or the covering provider during after hours 7P-7A, please log into the web site [email or web address] and access using universal [place] password for that web site. If you do not have the password, please call the hospital operator.  06/26/2021, 5:00 PM

## 2021-06-27 DIAGNOSIS — G9341 Metabolic encephalopathy: Secondary | ICD-10-CM | POA: Diagnosis not present

## 2021-06-27 MED ORDER — POTASSIUM CHLORIDE CRYS ER 20 MEQ PO TBCR
40.0000 meq | EXTENDED_RELEASE_TABLET | Freq: Once | ORAL | Status: AC
  Administered 2021-06-27: 40 meq via ORAL
  Filled 2021-06-27: qty 2

## 2021-06-27 MED ORDER — CYANOCOBALAMIN 1000 MCG/ML IJ SOLN: 1000.0000 ug | INTRAMUSCULAR | 0 refills | Status: DC

## 2021-06-27 MED ORDER — SENNOSIDES-DOCUSATE SODIUM 8.6-50 MG PO TABS: 2.0000 | ORAL_TABLET | Freq: Every day | ORAL | 1 refills | Status: AC

## 2021-06-27 MED ORDER — LEVOTHYROXINE SODIUM 25 MCG PO TABS: 25.0000 ug | ORAL_TABLET | Freq: Every day | ORAL | Status: AC

## 2021-06-27 MED ORDER — ENSURE COMPLETE PO LIQD: 237.0000 mL | Freq: Two times a day (BID) | ORAL | 2 refills | Status: DC

## 2021-06-27 NOTE — Discharge Instructions (Signed)
1) encourage nutritional supplements including Ensure or boost as desired 2) outpatient palliative care consult advised 3) repeat CBC and repeat BMP blood test on Monday, 07/02/2021

## 2021-06-27 NOTE — TOC Transition Note (Signed)
Transition of Care [facility]) - CM/SW Discharge Note   Patient Details  Name: Robert Hurst MRN: [id number] Date of Birth: [birth year]  Transition of [facility]) CM/SW Contact:  Elliot Gault, LCSW Phone Number: 06/27/2021, 12:36 PM   Clinical Narrative:     Pt medically stable for dc per MD. Updated Debbie at [place] and pt can return today. DC clinical sent electronically. RN to call report. EMS arranged.  Notified family via HIPPA compliant voicemail message. There are no other TOC needs for dc.  Final next level of care: Long Term Nursing Home Barriers to Discharge: Barriers Resolved   Patient Goals and CMS Choice Patient states their goals for this hospitalization and ongoing recovery are:: return to [facility]      Discharge Placement                       Discharge Plan and Services In-house Referral: Clinical Social Work   Post Acute Care Choice: Resumption of Svcs/PTA Provider                               Social Determinants of Health (SDOH) Interventions     Readmission Risk Interventions Readmission Risk Prevention Plan 06/25/2021 02/12/2021 05/25/2020  Medication Screening Complete - -  Transportation Screening Complete Complete Complete  PCP or Specialist Appt within 5-7 Days - Complete -  PCP or Specialist Appt within 3-5 Days - - Not Complete  Not Complete comments - - Patient is from [place]. He is seen by the facility medical director.  Home Care Screening - Complete -  Medication Review (RN CM) - Complete -  Social Work Consult for Recovery Care Planning/Counseling - - Complete  Palliative Care Screening - - Complete  Medication Review [employer]) - - Complete  Some recent data might be hidden

## 2021-06-27 NOTE — Discharge Summary (Signed)
Robert Hurst, is a 87 y.o. male  DOB 1931-11-01  MRN [id number].  Admission date:  06/24/2021  Admitting Physician  Onnie Boer, MD  Discharge Date:  06/27/2021   Primary MD  [name], MD  Recommendations for primary care physician for things to follow:   1)Encourage nutritional supplements including Ensure or boost as desired 2) outpatient palliative care consult advised 3)Repeat CBC and repeat BMP blood test on Monday, 07/02/2021   Admission Diagnosis  Metabolic encephalopathy [G93.41] Altered mental status, unspecified altered mental status type [R41.82] AMS (altered mental status) [R41.82] Sepsis with encephalopathy without septic shock, due to unspecified organism (HCC) [A41.9, R65.20, G93.40] Acute metabolic encephalopathy [G93.41]   Discharge Diagnosis  Metabolic encephalopathy [G93.41] Altered mental status, unspecified altered mental status type [R41.82] AMS (altered mental status) [R41.82] Sepsis with encephalopathy without septic shock, due to unspecified organism (HCC) [A41.9, R65.20, G93.40] Acute metabolic encephalopathy [G93.41]   Principal Problem:   Acute metabolic encephalopathy Active Problems:   Dementia without behavioral disturbance (HCC)   History of DVT (deep vein thrombosis)   Lactic acidosis   CKD (chronic kidney disease), stage III (HCC)   Hypothyroidism   Acute on chronic renal insufficiency      Past Medical History:  Diagnosis Date   Alzheimer disease (HCC)    Calculus of bile duct (any) with acute cholecystitis    Cognitive communication deficit    COVID-19    Dementia (HCC)    DVT (deep venous thrombosis) (HCC)    Dysphagia    Malnourished (HCC)    Metabolic encephalopathy    Protein calorie malnutrition (HCC)    Renal disorder    Syncope and collapse    Thyroid disease     Past Surgical History:  Procedure Laterality Date    APPENDECTOMY     BALLOON DILATION N/A 01/15/2018   Procedure: BALLOON DILATION;  Surgeon: Malissa Hippo, MD;  [facility];  Service: Endoscopy;  Laterality: N/A;   ERCP N/A 01/15/2018   Procedure: ENDOSCOPIC RETROGRADE CHOLANGIOPANCREATOGRAPHY (ERCP);  Surgeon: Malissa Hippo, MD;  Location[facility];  Service: Endoscopy;  Laterality: N/A;   REMOVAL OF STONES N/A 01/15/2018   Procedure: REMOVAL OF STONES;  Surgeon: Malissa Hippo, MD;  [facility];  Service: Endoscopy;  Laterality: N/A;   SPHINCTEROTOMY N/A 01/15/2018   Procedure: SPHINCTEROTOMY;  Surgeon: Malissa Hippo, MD;  [facility];  Service: Endoscopy;  Laterality: N/A;   TONSILLECTOMY       HPI  from the history and physical done on the day of admission:   Chief Complaint: AMS   HPI: Robert Hurst is a 87 y.o. male with medical history significant for  DVT, Dementia, CKD3b. Presented to the ED from [facility] with reports of change in mental status. On my evaluation patient is purposely closing his eyes, resisting exam, and not talking. I called staff at nursing home, talked to [facility], she says at baseline he is alert and oriented, patient can walk from  his bed to wheel chair, will talk when he wants but not much, and he is sometimes combative. He was wheeling himself around today, no cough, difficulty breathing diarrhea or other symptoms noted. Then patient vomited, became cold and clammy and unresponsive, responding only to pain.  CNA was with patient when he vomited, they dont think he aspirated. They were unable to measure his blood pressure and o2 sat was 95 % on room air.   Talked to daughters, they think their father is in pain, and this is unusual and are concerned for kidney stones.   ED Course: Temp 99, heart rate 48 - 51, blood pressure 84 - 119, sats 95% on room air. Chest xray and Head Ct both negative for acute abnormality. Lactic acid- 4. UA pending. 2 L bolus given.  Started on  broad spectrum antibiotics Vanc, metronidazole and cefepime.   Review of Systems: Unable to ascertain 2/2 baseline line dementia and AMS      Hospital Course:    1-acute metabolic encephalopathy -In the setting of severe dehydration and ongoing lactic acidosis -Patient was found unresponsive at a skilled nursing facility where he resides with noticeable low blood pressure. -Vital signs has stabilized after fluid resuscitation -Lactic acid normalized after IV fluids -Returned with cefepime and Flagyl, PCT less than 0.1, cultures negative no evidence of bacterial infection okay to stop antibiotics as of 06/27/2021 --TSH and cortisol within normal limits.  2-Dementia without behavioral disturbance (HCC) -Continue supportive care -At baseline patient is nursing home resident and ambulates with wheelchair.   3-B12 deficiency----monthly B12 injections advised   4-acute kidney injury on CKD (chronic kidney disease), stage IIIb (HCC) -In the setting of prerenal azotemia and dehydration most likely. -Minimize nephrotoxic agents, hypotension and contrast. -Continue fluid resuscitation and follow renal function trend. -Creatinine baseline 1.2-1.3; creatinine up to 1.6 on admission. -Improved after fluid resuscitation and essentially back to baseline; creatinine 1.26 currently.   5-Hypothyroidism -Repeat TSH has improved to 4.1 from 10 previously -Continue Synthroid.      Code Status: DNR  Disposition:    Dispo: The patient is from: SNF              Anticipated d/c is to: SNF               Discharge Condition: stable  Follow UP--Pcp and patient palliative care consult advised   Diet and Activity recommendation:  As advised  Discharge Instructions    Discharge Instructions     Call MD for:  difficulty breathing, headache or visual disturbances   Complete by: As directed    Call MD for:  persistant dizziness or light-headedness   Complete by: As directed    Call MD for:   persistant nausea and vomiting   Complete by: As directed    Call MD for:  temperature >100.4   Complete by: As directed    Diet - low sodium heart healthy   Complete by: As directed    Discharge instructions   Complete by: As directed    1) encourage nutritional supplements including Ensure or boost as desired 2) outpatient palliative care consult advised 3) repeat CBC and repeat BMP blood test on Monday, 07/02/2021   Increase activity slowly   Complete by: As directed          Discharge Medications     Allergies as of 06/27/2021       Reactions   Penicillins Other (See Comments)   Unknown reaction  Medication List     STOP taking these medications    apixaban 2.5 MG Tabs tablet Commonly known as: ELIQUIS   polyethylene glycol 17 g packet Commonly known as: MIRALAX / GLYCOLAX   senna 8.6 MG tablet Commonly known as: SENOKOT       TAKE these medications    acetaminophen 325 MG tablet Commonly known as: TYLENOL Take 2 tablets (650 mg total) by mouth every 6 (six) hours as needed for mild pain, fever or headache (fever >/= 101).   cyanocobalamin 1000 MCG/ML injection Commonly known as: (VITAMIN B-12) Inject 1 mL (1,000 mcg total) into the muscle every 30 (thirty) days. Start taking on: July 18, 2021   feeding supplement Liqd Take 237 mLs by mouth 3 (three) times daily. What changed: Another medication with the same name was added. Make sure you understand how and when to take each.   feeding supplement (ENSURE COMPLETE) Liqd Take 237 mLs by mouth 2 (two) times daily between meals. What changed: You were already taking a medication with the same name, and this prescription was added. Make sure you understand how and when to take each.   levothyroxine 25 MCG tablet Commonly known as: SYNTHROID Take 1 tablet (25 mcg total) by mouth daily before breakfast. What changed:  medication strength how much to take when to take this   memantine 10  MG tablet Commonly known as: NAMENDA Take 10 mg by mouth 2 (two) times daily.   ondansetron 4 MG tablet Commonly known as: ZOFRAN Take 1 tablet (4 mg total) by mouth every 6 (six) hours as needed for nausea.   Potassium Chloride ER 20 MEQ Tbcr Take 20 mEq by mouth daily. 1 tab daily by mouth   senna-docusate 8.6-50 MG tablet Commonly known as: Senokot-S Take 2 tablets by mouth at bedtime.        Major procedures and Radiology Reports - PLEASE review detailed and final reports for all details, in brief -  CT ABDOMEN PELVIS WO CONTRAST  Result Date: 06/24/2021 CLINICAL DATA:  Acute abdominal pain and lactic acidosis EXAM: CT ABDOMEN AND PELVIS WITHOUT CONTRAST TECHNIQUE: Multidetector CT imaging of the abdomen and pelvis was performed following the standard protocol without IV contrast. COMPARISON:  02/11/2018 FINDINGS: Lower chest: Calcified granulomas are noted in the right lower lobe. Mild bibasilar atelectasis is seen without sizable effusion or focal parenchymal nodule. Hepatobiliary: Gallbladder has been surgically removed. Changes of pneumobilia are noted stable in appearance from the prior exam. The previously seen gallbladder fossa fluid collection has resolved interval. Pancreas: Unremarkable. No pancreatic ductal dilatation or surrounding inflammatory changes. Spleen: Normal in size without focal abnormality. Adrenals/Urinary Tract: Adrenal glands are within normal limits. Kidneys are well visualized bilaterally with staghorn calculus in the right upper pole which measures at least 2.2 cm in greatest dimension. No obstructive changes are seen. Right ureter is within normal limits. Left kidney also demonstrates nonobstructing calculi measuring 12 mm in the midportion of the kidney and 9 mm in the lower pole. The ureter is within normal limits. No obstructive changes are seen. Small Peri ureteral diverticulum is noted on the left. Bladder is otherwise within normal limits. Bilateral  renal cysts are noted stable in appearance from the prior CT examination. Stomach/Bowel: No obstructive or inflammatory changes of the colon are noted. Scattered fecal material is seen throughout the colon. The appendix is not well visualized consistent with the prior surgical history. Small bowel and stomach are within normal limits. Vascular/Lymphatic: Atherosclerotic calcifications of  the abdominal aorta are noted. No significant lymphadenopathy is seen. Reproductive: Prostate is prominent with scattered calcifications. Other: No abdominal wall hernia or abnormality. No abdominopelvic ascites. Musculoskeletal: Degenerative changes are noted in the lumbar spine. Bone island is seen within the left iliac wing. IMPRESSION: Bilateral renal cystic change and nonobstructing renal calculi worse on the right than the left. Changes consistent with prior cholecystectomy and evidence of pneumobilia stable from the prior exam. Status post appendectomy. Changes of prior granulomatous disease. Mild bibasilar atelectasis is noted as well. Electronically Signed   By: Alcide Clever M.D.   On: 06/24/2021 20:53   DG Chest 1 View  Result Date: 06/24/2021 CLINICAL DATA:  Altered mental status. EXAM: CHEST  1 VIEW COMPARISON:  Chest x-ray 02/10/2021. FINDINGS: The aorta is tortuous and the heart is enlarged, unchanged. Lung volumes are low. There is strandy atelectasis in the lung bases. There is no lung consolidation, pleural effusion or pneumothorax. No acute fractures are seen. There are surgical clips in the right abdomen. IMPRESSION: 1. Low lung volumes with bibasilar atelectasis. Electronically Signed   By: Darliss Cheney M.D.   On: 06/24/2021 15:11   CT Head Wo Contrast  Result Date: 06/24/2021 CLINICAL DATA:  Altered mental status. EXAM: CT HEAD WITHOUT CONTRAST TECHNIQUE: Contiguous axial images were obtained from the base of the skull through the vertex without intravenous contrast. COMPARISON:  CT head dated Feb 10, 2021. FINDINGS: Brain: No evidence of acute infarction, hemorrhage, hydrocephalus, extra-axial collection or mass lesion/mass effect. Stable atrophy and chronic microvascular ischemic changes. Vascular: Atherosclerotic vascular calcification of the carotid siphons. No hyperdense vessel. Skull: Normal. Negative for fracture or focal lesion. Sinuses/Orbits: No acute finding. Other: None. IMPRESSION: 1. No acute intracranial abnormality. 2. Stable atrophy and chronic microvascular ischemic changes. Electronically Signed   By: Obie Dredge M.D.   On: 06/24/2021 15:12    Micro Results  Recent Results (from the past 240 hour(s))  Culture, blood (routine x 2)     Status: None (Preliminary result)   Collection Time: 06/24/21  2:26 PM   Specimen: BLOOD LEFT FOREARM  Result Value Ref Range Status   Specimen Description   Final    BLOOD LEFT FOREARM BOTTLES DRAWN AEROBIC AND ANAEROBIC   Special Requests Blood Culture adequate volume  Final   Culture   Final    NO GROWTH 3 DAYS Performed at [facility], [address] [place], [place] [address]    Report Status PENDING  Incomplete  Resp Panel by RT-PCR (Flu A&B, Covid) Nasopharyngeal Swab     Status: None   Collection Time: 06/24/21  2:36 PM   Specimen: Nasopharyngeal Swab; Nasopharyngeal(NP) swabs in vial transport medium  Result Value Ref Range Status   SARS Coronavirus 2 by RT PCR NEGATIVE NEGATIVE Final    Comment: (NOTE) SARS-CoV-2 target nucleic acids are NOT DETECTED.  The SARS-CoV-2 RNA is generally detectable in upper respiratory specimens during the acute phase of infection. The lowest concentration of SARS-CoV-2 viral copies this assay can detect is 138 copies/mL. A negative result does not preclude SARS-Cov-2 infection and should not be used as the sole basis for treatment or other patient management decisions. A negative result may occur with  improper specimen collection/handling, submission of specimen other than nasopharyngeal  swab, presence of viral mutation(s) within the areas targeted by this assay, and inadequate number of viral copies(<138 copies/mL). A negative result must be combined with clinical observations, patient history, and epidemiological information. The expected result is  Negative.  Fact Sheet for Patients:  BloggerCourse.com  Fact Sheet for Healthcare Providers:  SeriousBroker.it  This test is no t yet approved or cleared by the [place] FDA and  has been authorized for detection and/or diagnosis of SARS-CoV-2 by FDA under an Emergency Use Authorization (EUA). This EUA will remain  in effect (meaning this test can be used) for the duration of the COVID-19 declaration under Section 564(b)(1) of the Act, 21 U.S.C.section 360bbb-3(b)(1), unless the authorization is terminated  or revoked sooner.       Influenza A by PCR NEGATIVE NEGATIVE Final   Influenza B by PCR NEGATIVE NEGATIVE Final    Comment: (NOTE) The Xpert Xpress SARS-CoV-2/FLU/RSV plus assay is intended as an aid in the diagnosis of influenza from Nasopharyngeal swab specimens and should not be used as a sole basis for treatment. Nasal washings and aspirates are unacceptable for Xpert Xpress SARS-CoV-2/FLU/RSV testing.  Fact Sheet for Patients: BloggerCourse.com  Fact Sheet for Healthcare Providers: SeriousBroker.it  This test is not yet approved or cleared by the [place] FDA and has been authorized for detection and/or diagnosis of SARS-CoV-2 by FDA under an Emergency Use Authorization (EUA). This EUA will remain in effect (meaning this test can be used) for the duration of the COVID-19 declaration under Section 564(b)(1) of the Act, 21 U.S.C. section 360bbb-3(b)(1), unless the authorization is terminated or revoked.  Performed at [facility], [address], [place], [place] [address]   Culture, blood  (routine x 2)     Status: None (Preliminary result)   Collection Time: 06/24/21  2:43 PM   Specimen: Right Antecubital; Blood  Result Value Ref Range Status   Specimen Description   Final    RIGHT ANTECUBITAL BOTTLES DRAWN AEROBIC AND ANAEROBIC   Special Requests Blood Culture adequate volume  Final   Culture   Final    NO GROWTH 3 DAYS Performed at [facility], [address] [place], [place] [address]    Report Status PENDING  Incomplete  MRSA Next Gen by PCR, Nasal     Status: Abnormal   Collection Time: 06/25/21  5:43 PM   Specimen: Nasal Mucosa; Nasal Swab  Result Value Ref Range Status   MRSA by PCR Next Gen DETECTED (A) NOT DETECTED Final    Comment: RESULT CALLED TO, READ BACK BY AND VERIFIED WITH: Edgar Frisk RN,2022,06/25/2021,SELF S (NOTE) The GeneXpert MRSA Assay (FDA approved for NASAL specimens only), is one component of a comprehensive MRSA colonization surveillance program. It is not intended to diagnose MRSA infection nor to guide or monitor treatment for MRSA infections. Test performance is not FDA approved in patients less than 42 years old. Performed at [facility], [address], [place], [place] [address]        Today   Subjective    Brandol Terhune today has no new complaints -Tolerating oral intake well No fever  Or chills   No Nausea, Vomiting or Diarrhea -No cough or respiratory symptoms            Patient has been seen and examined prior to discharge   Objective   Blood pressure 135/76, pulse (!) 56, temperature 98.2 F (36.8 C), resp. rate 20, height 5\' 5"  (1.651 m), weight 55.5 kg, SpO2 98 %.   Intake/Output Summary (Last 24 hours) at 06/27/2021 1219 Last data filed at 06/27/2021 0900 Gross per 24 hour  Intake 370 ml  Output 1650 ml  Net -1280 ml    Exam Gen:- Awake  Alert, no acute distress  HEENT:- [place].AT, No sclera icterus Neck-Supple Neck,No JVD,.  Lungs-  CTAB , good air movement bilaterally  CV- S1, S2 normal,  regular Abd-  +ve B.Sounds, Abd Soft, No tenderness,    Extremity/Skin:- No  edema,   good pulses Psych-underlying cognitive and memory deficits  neuro- generalized weakness and deconditioning, No new focal deficits, no tremors    Data Review   CBC w Diff:  Lab Results  Component Value Date   WBC 7.2 06/25/2021   HGB 14.3 06/25/2021   HCT 44.2 06/25/2021   PLT 123 (L) 06/25/2021   LYMPHOPCT 37 06/24/2021   MONOPCT 10 06/24/2021   EOSPCT 1 06/24/2021   BASOPCT 0 06/24/2021    CMP:  Lab Results  Component Value Date   NA 139 06/26/2021   K 3.4 (L) 06/26/2021   CL 108 06/26/2021   CO2 24 06/26/2021   BUN 13 06/26/2021   CREATININE 1.26 (H) 06/26/2021   PROT 8.2 (H) 06/24/2021   ALBUMIN 4.0 06/24/2021   BILITOT 1.0 06/24/2021   ALKPHOS 103 06/24/2021   AST 23 06/24/2021   ALT 11 06/24/2021  .   Total Discharge time is about 33 minutes  Shon Hale M.D on 06/27/2021 at 12:19 PM  Go to [email or web address] -  for contact info  [facility] - Office  [phone]

## 2021-06-29 LAB — CULTURE, BLOOD (ROUTINE X 2)
Culture: NO GROWTH
Culture: NO GROWTH
Special Requests: ADEQUATE
Special Requests: ADEQUATE

## 2021-07-16 ENCOUNTER — Encounter (HOSPITAL_COMMUNITY): Payer: Self-pay | Admitting: *Deleted

## 2021-07-16 ENCOUNTER — Other Ambulatory Visit: Payer: Self-pay

## 2021-07-16 ENCOUNTER — Emergency Department (HOSPITAL_COMMUNITY): Payer: Medicare Other

## 2021-07-16 ENCOUNTER — Inpatient Hospital Stay (HOSPITAL_COMMUNITY)
Admission: EM | Admit: 2021-07-16 | Discharge: 2021-07-18 | DRG: 948 | Disposition: A | Discharge status: Skilled Nursing Facility | Payer: Medicare Other | Source: Skilled Nursing Facility | Attending: Internal Medicine | Admitting: Internal Medicine

## 2021-07-16 DIAGNOSIS — Z79899 Other long term (current) drug therapy: Secondary | ICD-10-CM

## 2021-07-16 DIAGNOSIS — Z880 Allergy status to penicillin: Secondary | ICD-10-CM | POA: Diagnosis not present

## 2021-07-16 DIAGNOSIS — J811 Chronic pulmonary edema: Secondary | ICD-10-CM | POA: Diagnosis present

## 2021-07-16 DIAGNOSIS — F0390 Unspecified dementia without behavioral disturbance: Secondary | ICD-10-CM | POA: Diagnosis present

## 2021-07-16 DIAGNOSIS — Z8616 Personal history of COVID-19: Secondary | ICD-10-CM | POA: Diagnosis not present

## 2021-07-16 DIAGNOSIS — N1832 Chronic kidney disease, stage 3b: Secondary | ICD-10-CM | POA: Diagnosis present

## 2021-07-16 DIAGNOSIS — F0280 Dementia in other diseases classified elsewhere without behavioral disturbance: Secondary | ICD-10-CM | POA: Diagnosis present

## 2021-07-16 DIAGNOSIS — Z66 Do not resuscitate: Secondary | ICD-10-CM | POA: Diagnosis present

## 2021-07-16 DIAGNOSIS — R4189 Other symptoms and signs involving cognitive functions and awareness: Secondary | ICD-10-CM | POA: Diagnosis not present

## 2021-07-16 DIAGNOSIS — R400 Somnolence: Secondary | ICD-10-CM

## 2021-07-16 DIAGNOSIS — E039 Hypothyroidism, unspecified: Secondary | ICD-10-CM | POA: Diagnosis present

## 2021-07-16 DIAGNOSIS — R7989 Other specified abnormal findings of blood chemistry: Secondary | ICD-10-CM | POA: Diagnosis not present

## 2021-07-16 DIAGNOSIS — G309 Alzheimer's disease, unspecified: Secondary | ICD-10-CM | POA: Diagnosis present

## 2021-07-16 DIAGNOSIS — E872 Acidosis, unspecified: Secondary | ICD-10-CM | POA: Diagnosis present

## 2021-07-16 DIAGNOSIS — Z20822 Contact with and (suspected) exposure to covid-19: Secondary | ICD-10-CM | POA: Diagnosis present

## 2021-07-16 DIAGNOSIS — R4182 Altered mental status, unspecified: Secondary | ICD-10-CM | POA: Diagnosis present

## 2021-07-16 DIAGNOSIS — Z79890 Hormone replacement therapy (postmenopausal): Secondary | ICD-10-CM | POA: Diagnosis not present

## 2021-07-16 DIAGNOSIS — N183 Chronic kidney disease, stage 3 unspecified: Secondary | ICD-10-CM | POA: Diagnosis present

## 2021-07-16 DIAGNOSIS — Z86718 Personal history of other venous thrombosis and embolism: Secondary | ICD-10-CM

## 2021-07-16 LAB — BLOOD GAS, ARTERIAL
Acid-base deficit: 2.1 mmol/L — ABNORMAL HIGH (ref 0.0–2.0)
Bicarbonate: 22.2 mmol/L (ref 20.0–28.0)
FIO2: 36
O2 Saturation: 98.2 %
Patient temperature: 37
pCO2 arterial: 45.9 mmHg (ref 32.0–48.0)
pH, Arterial: 7.323 — ABNORMAL LOW (ref 7.350–7.450)
pO2, Arterial: 141 mmHg — ABNORMAL HIGH (ref 83.0–108.0)

## 2021-07-16 LAB — COMPREHENSIVE METABOLIC PANEL
ALT: 11 U/L (ref 0–44)
AST: 20 U/L (ref 15–41)
Albumin: 3.8 g/dL (ref 3.5–5.0)
Alkaline Phosphatase: 85 U/L (ref 38–126)
Anion gap: 12 (ref 5–15)
BUN: 18 mg/dL (ref 8–23)
CO2: 22 mmol/L (ref 22–32)
Calcium: 9.1 mg/dL (ref 8.9–10.3)
Chloride: 108 mmol/L (ref 98–111)
Creatinine, Ser: 1.68 mg/dL — ABNORMAL HIGH (ref 0.61–1.24)
GFR, Estimated: 39 mL/min — ABNORMAL LOW (ref >60)
Glucose, Bld: 118 mg/dL — ABNORMAL HIGH (ref 70–99)
Potassium: 4.5 mmol/L (ref 3.5–5.1)
Sodium: 142 mmol/L (ref 135–145)
Total Bilirubin: 0.8 mg/dL (ref 0.3–1.2)
Total Protein: 7.6 g/dL (ref 6.5–8.1)

## 2021-07-16 LAB — CBC WITH DIFFERENTIAL/PLATELET
Abs Immature Granulocytes: 0.03 10*3/uL (ref 0.00–0.07)
Basophils Absolute: 0.1 10*3/uL (ref 0.0–0.1)
Basophils Relative: 1 %
Eosinophils Absolute: 0.3 10*3/uL (ref 0.0–0.5)
Eosinophils Relative: 3 %
HCT: 51.5 % (ref 39.0–52.0)
Hemoglobin: 17 g/dL (ref 13.0–17.0)
Immature Granulocytes: 0 %
Lymphocytes Relative: 46 %
Lymphs Abs: 4.6 10*3/uL — ABNORMAL HIGH (ref 0.7–4.0)
MCH: 30.1 pg (ref 26.0–34.0)
MCHC: 33 g/dL (ref 30.0–36.0)
MCV: 91.3 fL (ref 80.0–100.0)
Monocytes Absolute: 1.1 10*3/uL — ABNORMAL HIGH (ref 0.1–1.0)
Monocytes Relative: 11 %
Neutro Abs: 3.8 10*3/uL (ref 1.7–7.7)
Neutrophils Relative %: 39 %
Platelets: 216 10*3/uL (ref 150–400)
RBC: 5.64 MIL/uL (ref 4.22–5.81)
RDW: 15.1 % (ref 11.5–15.5)
WBC: 9.9 10*3/uL (ref 4.0–10.5)
nRBC: 0 % (ref 0.0–0.2)

## 2021-07-16 LAB — URINALYSIS, ROUTINE W REFLEX MICROSCOPIC
Bacteria, UA: NONE SEEN
Bilirubin Urine: NEGATIVE
Glucose, UA: NEGATIVE mg/dL
Hgb urine dipstick: NEGATIVE
Ketones, ur: NEGATIVE mg/dL
Nitrite: NEGATIVE
Protein, ur: NEGATIVE mg/dL
Specific Gravity, Urine: 1.026 (ref 1.005–1.030)
pH: 6 (ref 5.0–8.0)

## 2021-07-16 LAB — PROCALCITONIN: Procalcitonin: <0.1 ng/mL

## 2021-07-16 LAB — CBC
HCT: 46.8 % (ref 39.0–52.0)
Hemoglobin: 15.1 g/dL (ref 13.0–17.0)
MCH: 29.7 pg (ref 26.0–34.0)
MCHC: 32.3 g/dL (ref 30.0–36.0)
MCV: 92.1 fL (ref 80.0–100.0)
Platelets: 151 10*3/uL (ref 150–400)
RBC: 5.08 MIL/uL (ref 4.22–5.81)
RDW: 14.8 % (ref 11.5–15.5)
WBC: 6.2 10*3/uL (ref 4.0–10.5)
nRBC: 0 % (ref 0.0–0.2)

## 2021-07-16 LAB — CREATININE, SERUM
Creatinine, Ser: 1.42 mg/dL — ABNORMAL HIGH (ref 0.61–1.24)
GFR, Estimated: 48 mL/min — ABNORMAL LOW (ref >60)

## 2021-07-16 LAB — APTT: aPTT: 24 seconds (ref 24–36)

## 2021-07-16 LAB — CBG MONITORING, ED: Glucose-Capillary: 96 mg/dL (ref 70–99)

## 2021-07-16 LAB — RESP PANEL BY RT-PCR (FLU A&B, COVID) ARPGX2
Influenza A by PCR: NEGATIVE
Influenza B by PCR: NEGATIVE
SARS Coronavirus 2 by RT PCR: NEGATIVE

## 2021-07-16 LAB — LACTIC ACID, PLASMA
Lactic Acid, Venous: 4.1 mmol/L (ref 0.5–1.9)
Lactic Acid, Venous: 4.2 mmol/L (ref 0.5–1.9)
Lactic Acid, Venous: 4.4 mmol/L (ref 0.5–1.9)
Lactic Acid, Venous: 3.7 mmol/L (ref 0.5–1.9)

## 2021-07-16 LAB — PROTIME-INR
INR: 1.1 (ref 0.8–1.2)
Prothrombin Time: 14.1 seconds (ref 11.4–15.2)

## 2021-07-16 LAB — TROPONIN I (HIGH SENSITIVITY): Troponin I (High Sensitivity): 16 ng/L (ref <18)

## 2021-07-16 MED ORDER — SODIUM CHLORIDE 0.9 % IV SOLN
2.0000 g | INTRAVENOUS | Status: DC
Start: 2021-07-16 — End: 2021-07-18
  Administered 2021-07-16 – 2021-07-17 (×2): 2 g via INTRAVENOUS
  Filled 2021-07-16 (×2): qty 2

## 2021-07-16 MED ORDER — LACTATED RINGERS IV BOLUS (SEPSIS)
1000.0000 mL | Freq: Once | INTRAVENOUS | Status: AC
  Administered 2021-07-16: 1000 mL via INTRAVENOUS

## 2021-07-16 MED ORDER — ONDANSETRON HCL 4 MG PO TABS: 4.0000 mg | ORAL_TABLET | Freq: Four times a day (QID) | ORAL | Status: DC | PRN

## 2021-07-16 MED ORDER — SENNOSIDES-DOCUSATE SODIUM 8.6-50 MG PO TABS
2.0000 | ORAL_TABLET | Freq: Every day | ORAL | Status: DC
  Administered 2021-07-17: 2 via ORAL
  Filled 2021-07-16: qty 2

## 2021-07-16 MED ORDER — ACETAMINOPHEN 650 MG RE SUPP: 650.0000 mg | Freq: Four times a day (QID) | RECTAL | Status: DC | PRN

## 2021-07-16 MED ORDER — VANCOMYCIN HCL IN DEXTROSE 1-5 GM/200ML-% IV SOLN: 1000.0000 mg | INTRAVENOUS | Status: DC

## 2021-07-16 MED ORDER — DOCUSATE SODIUM 100 MG PO CAPS
100.0000 mg | ORAL_CAPSULE | Freq: Two times a day (BID) | ORAL | Status: DC
  Administered 2021-07-17 – 2021-07-18 (×2): 100 mg via ORAL
  Filled 2021-07-16 (×2): qty 1

## 2021-07-16 MED ORDER — ACETAMINOPHEN 325 MG PO TABS: 650.0000 mg | ORAL_TABLET | Freq: Four times a day (QID) | ORAL | Status: DC | PRN

## 2021-07-16 MED ORDER — ENSURE ENLIVE PO LIQD
237.0000 mL | Freq: Three times a day (TID) | ORAL | Status: DC
  Administered 2021-07-17 – 2021-07-18 (×3): 237 mL via ORAL
  Filled 2021-07-16 (×2): qty 237

## 2021-07-16 MED ORDER — ONDANSETRON HCL 4 MG/2ML IJ SOLN: 4.0000 mg | Freq: Four times a day (QID) | INTRAMUSCULAR | Status: DC | PRN

## 2021-07-16 MED ORDER — LACTATED RINGERS IV SOLN: INTRAVENOUS | Status: DC

## 2021-07-16 MED ORDER — SODIUM CHLORIDE 0.9 % IV SOLN: 2.0000 g | Freq: Once | INTRAVENOUS | Status: DC

## 2021-07-16 MED ORDER — LEVOTHYROXINE SODIUM 25 MCG PO TABS
25.0000 ug | ORAL_TABLET | Freq: Every day | ORAL | Status: DC
  Administered 2021-07-18: 25 ug via ORAL
  Filled 2021-07-16: qty 1

## 2021-07-16 MED ORDER — MEMANTINE HCL 10 MG PO TABS
10.0000 mg | ORAL_TABLET | Freq: Two times a day (BID) | ORAL | Status: DC
  Administered 2021-07-17 – 2021-07-18 (×2): 10 mg via ORAL
  Filled 2021-07-16 (×2): qty 1

## 2021-07-16 MED ORDER — SODIUM CHLORIDE 0.9 % IV SOLN: INTRAVENOUS | Status: DC

## 2021-07-16 MED ORDER — VANCOMYCIN HCL 1250 MG/250ML IV SOLN
1250.0000 mg | Freq: Once | INTRAVENOUS | Status: AC
  Administered 2021-07-16: 1250 mg via INTRAVENOUS
  Filled 2021-07-16: qty 250

## 2021-07-16 MED ORDER — ENOXAPARIN SODIUM 30 MG/0.3ML IJ SOSY
30.0000 mg | PREFILLED_SYRINGE | INTRAMUSCULAR | Status: DC
  Administered 2021-07-16 – 2021-07-17 (×2): 30 mg via SUBCUTANEOUS
  Filled 2021-07-16 (×2): qty 0.3

## 2021-07-16 MED ORDER — IOHEXOL 300 MG/ML  SOLN
80.0000 mL | Freq: Once | INTRAMUSCULAR | Status: AC | PRN
  Administered 2021-07-16: 80 mL via INTRAVENOUS

## 2021-07-16 MED ORDER — SODIUM CHLORIDE 0.9 % IV BOLUS
1000.0000 mL | Freq: Once | INTRAVENOUS | Status: AC
  Administered 2021-07-16: 1000 mL via INTRAVENOUS

## 2021-07-16 NOTE — ED Provider Notes (Signed)
Signout from Dr. Criss Alvine.  87 year old male DNR/DNI here with altered mental status from facility.  EMS was bagging him intermittently.  Patient is awake although not answering any questions. Physical Exam  BP 126/64   Pulse (!) 50   Temp (!) 96.4 F (35.8 C) (Rectal)   Resp 10   Ht 5\' 5"  (1.651 m)   Wt 55.5 kg   SpO2 98%   BMI 20.36 kg/m   Physical Exam  ED Course/Procedures   Clinical Course as of 07/16/21 1649  Mon Jul 16, 2021  1329 Patient is moving all 4 extremities though not really following commands.  Given he is a DO NOT RESUSCITATE will not intubate him right now.  He is breathing on his own.  Otherwise, while it does sound like he had an acute change in mental status, this is very similar to his most recent 2 admissions and so I do not think stroke is likely or that this is a code stroke with no focal deficits. [SG]    Clinical Course User Index [SG] Jul 18, 2021, MD    Procedures  MDM  Patient is pending a temperature and CT abdomen and pelvis.  He is antibiotics and fluids.  His lactate is elevated.  Still needs to give a urine. CT abdomen pelvis did not show any acute findings that would explain patient's current condition. Discussed with Dr. Pricilla Loveless [facility] who will evaluate the patient for admission.       Lucianne Muss, MD 07/17/21 1044

## 2021-07-16 NOTE — ED Provider Notes (Signed)
[facility] EMERGENCY DEPARTMENT Provider Note   CSN: [id number] Arrival date & time: 07/16/21  1318  LEVEL 5 CAVEAT - ALTERED MENTAL STATUS   History No chief complaint on file.   Robert Hurst is a 87 y.o. male.  HPI 87 year old male presents with unresponsiveness.  History is from EMS who is bringing the patient from [facility].  Reportedly he became unresponsive during lunch today.  He was having intermittent apnea and they started to bag him.  He has a DO NOT RESUSCITATE.  After being bagged for a little bit he started having some purposeful movement.  The history is otherwise very limited as he is altered.  Past Medical History:  Diagnosis Date   Alzheimer disease (HCC)    Calculus of bile duct (any) with acute cholecystitis    Cognitive communication deficit    COVID-19    Dementia (HCC)    DVT (deep venous thrombosis) (HCC)    Dysphagia    Malnourished (HCC)    Metabolic encephalopathy    Protein calorie malnutrition (HCC)    Renal disorder    Syncope and collapse    Thyroid disease     Patient Active Problem List   Diagnosis Date Noted   Severe sepsis (HCC) 02/10/2021   COVID-19    Advanced care planning/counseling discussion    Palliative care by specialist    Sepsis due to undetermined organism (HCC) 05/22/2020   Hypotension due to hypovolemia    Thrombocytopenia (HCC) 05/03/2020   Sepsis due to Enterococcus (HCC) 05/03/2020   Acute on chronic renal insufficiency    UTI (urinary tract infection) 04/30/2020   CKD (chronic kidney disease), stage III (HCC) 02/12/2020   Hypothyroidism 02/12/2020   History of DVT (deep vein thrombosis)    History of COVID-19    Acute cystitis without hematuria    Goals of care, counseling/discussion    Palliative care encounter    Acute metabolic encephalopathy 08/11/2019   Syncope and collapse 08/11/2019   Syncope    Dementia without behavioral disturbance (HCC) 08/08/2019   Acute renal failure superimposed on  stage 3b chronic kidney disease (HCC) 08/08/2019   Lactic acidosis 08/08/2019   Incontinence 08/08/2019    Past Surgical History:  Procedure Laterality Date   APPENDECTOMY     BALLOON DILATION N/A 01/15/2018   Procedure: BALLOON DILATION;  Surgeon: Malissa Hippo, MD;  [facility]: [facility];  Service: Endoscopy;  Laterality: N/A;   ERCP N/A 01/15/2018   Procedure: ENDOSCOPIC RETROGRADE CHOLANGIOPANCREATOGRAPHY (ERCP);  Surgeon: Malissa Hippo, MD;  Location: [facility];  Service: Endoscopy;  Laterality: N/A;   REMOVAL OF STONES N/A 01/15/2018   Procedure: REMOVAL OF STONES;  Surgeon: Malissa Hippo, MD;  Location: [facility];  Service: Endoscopy;  Laterality: N/A;   SPHINCTEROTOMY N/A 01/15/2018   Procedure: SPHINCTEROTOMY;  Surgeon: Malissa Hippo, MD;  Location: [facility];  Service: Endoscopy;  Laterality: N/A;   TONSILLECTOMY         No family history on file.  Social History   Tobacco Use   Smoking status: Never   Smokeless tobacco: Never  Vaping Use   Vaping Use: Unknown  Substance Use Topics   Alcohol use: Not Currently   Drug use: Not Currently    Home Medications Prior to Admission medications   Medication Sig Start Date End Date Taking? Authorizing Provider  acetaminophen (TYLENOL) 325 MG tablet Take 2 tablets (650 mg total) by mouth every 6 (six) hours as  needed for mild pain, fever or headache (fever >/= 101). 05/26/20  Yes Johnson, Clanford L, MD  cyanocobalamin (,VITAMIN B-12,) 1000 MCG/ML injection Inject 1 mL (1,000 mcg total) into the muscle every 30 (thirty) days. 07/18/21  Yes Shon Hale, MD  levothyroxine (SYNTHROID) 25 MCG tablet Take 1 tablet (25 mcg total) by mouth daily before breakfast. 06/27/21  Yes Emokpae, Courage, MD  memantine (NAMENDA) 10 MG tablet Take 10 mg by mouth 2 (two) times daily.   Yes [provider]  ondansetron (ZOFRAN) 4 MG tablet Take 1 tablet (4 mg total) by mouth every 6 (six) hours as needed for  nausea. 02/13/21  Yes Emokpae, Courage, MD  Potassium Chloride ER 20 MEQ TBCR Take 20 mEq by mouth daily. 1 tab daily by mouth 02/13/21  Yes Emokpae, Courage, MD  senna-docusate (SENOKOT-S) 8.6-50 MG tablet Take 2 tablets by mouth at bedtime. 06/27/21 06/27/22 Yes Emokpae, Courage, MD  feeding supplement (ENSURE ENLIVE / ENSURE PLUS) LIQD Take 237 mLs by mouth 3 (three) times daily. 02/13/21   Shon Hale, MD  feeding supplement, ENSURE COMPLETE, (ENSURE COMPLETE) LIQD Take 237 mLs by mouth 2 (two) times daily between meals. 06/27/21   Shon Hale, MD    Allergies    Penicillins  Review of Systems   Review of Systems  Unable to perform ROS: Mental status change   Physical Exam Updated Vital Signs BP (!) 89/77   Pulse (!) 50   Resp 15   Ht 5\' 5"  (1.651 m)   Wt 55.5 kg   SpO2 100%   BMI 20.36 kg/m   Physical Exam Vitals and nursing note reviewed.  Constitutional:      Appearance: He is well-developed.  HENT:     Head: Normocephalic and atraumatic.     Right Ear: External ear normal.     Left Ear: External ear normal.     Nose: Nose normal.  Eyes:     General:        Right eye: No discharge.        Left eye: No discharge.     Pupils: Pupils are equal, round, and reactive to light.  Cardiovascular:     Rate and Rhythm: Regular rhythm. Bradycardia present.     Heart sounds: Normal heart sounds.  Pulmonary:     Effort: Pulmonary effort is normal.     Breath sounds: Normal breath sounds.     Comments: Patient is having his own spontaneous respirations Abdominal:     Palpations: Abdomen is soft.     Tenderness: There is abdominal tenderness.     Comments: Patient seems to move more when his abdomen is palpated and seems to be uncomfortable  Musculoskeletal:     Cervical back: Neck supple.  Skin:    General: Skin is warm and dry.  Neurological:     Mental Status: He is lethargic.     Comments: Patient does not open his eyes to voice.  However he does focally react  to pain in all 4 extremities.  He resists me when I try and open his eyes or open his jaw.  Psychiatric:        Mood and Affect: Mood is not anxious.    ED Results / Procedures / Treatments   Labs (all labs ordered are listed, but only abnormal results are displayed) Labs Reviewed  COMPREHENSIVE METABOLIC PANEL - Abnormal; Notable for the following components:      Result Value   Glucose, Bld 118 (*)  Creatinine, Ser 1.68 (*)    GFR, Estimated 39 (*)    All other components within normal limits  CBC WITH DIFFERENTIAL/PLATELET - Abnormal; Notable for the following components:   Lymphs Abs 4.6 (*)    Monocytes Absolute 1.1 (*)    All other components within normal limits  BLOOD GAS, ARTERIAL - Abnormal; Notable for the following components:   pH, Arterial 7.323 (*)    pO2, Arterial 141 (*)    Acid-base deficit 2.1 (*)    All other components within normal limits  RESP PANEL BY RT-PCR (FLU A&B, COVID) ARPGX2  CULTURE, BLOOD (ROUTINE X 2)  CULTURE, BLOOD (ROUTINE X 2)  URINE CULTURE  MRSA NEXT GEN BY PCR, NASAL  PROTIME-INR  APTT  LACTIC ACID, PLASMA  LACTIC ACID, PLASMA  URINALYSIS, ROUTINE W REFLEX MICROSCOPIC  CBG MONITORING, ED  I-STAT CHEM 8, ED  TROPONIN I (HIGH SENSITIVITY)  TROPONIN I (HIGH SENSITIVITY)    EKG EKG Interpretation  Date/Time:  Monday July 16 2021 13:26:35 EDT Ventricular Rate:  47 PR Interval:  127 QRS Duration: 92 QT Interval:  495 QTC Calculation: 438 R Axis:   21 Text Interpretation: Sinus bradycardia Abnormal R-wave progression, early transition Borderline T abnormalities, lateral leads similar to Jun 24 2021 Confirmed by Pricilla Loveless [phone]) on 07/16/2021 1:57:13 PM  Radiology CT Head Wo Contrast  Result Date: 07/16/2021 CLINICAL DATA:  Mental status change, unknown cause EXAM: CT HEAD WITHOUT CONTRAST TECHNIQUE: Contiguous axial images were obtained from the base of the skull through the vertex without intravenous contrast.  COMPARISON:  06/24/2021 FINDINGS: Brain: There is no acute intracranial hemorrhage, mass effect, or edema. No new loss of gray-white differentiation. There is no extra-axial fluid collection. Ventricles and sulci are stable in size and configuration. Patchy and confluent hypoattenuation in the supratentorial white matter probably reflects stable chronic microvascular ischemic changes. Small chronic left occipital infarct. Vascular: There is atherosclerotic calcification at the skull base. Skull: Calvarium is unremarkable. Sinuses/Orbits: No acute finding. Other: None. IMPRESSION: No acute intracranial abnormality. Electronically Signed   By: Guadlupe Spanish M.D.   On: 07/16/2021 15:31   DG Chest Port 1 View  Result Date: 07/16/2021 CLINICAL DATA:  Unresponsive. EXAM: PORTABLE CHEST 1 VIEW COMPARISON:  Chest x-ray dated June 24, 2021. FINDINGS: Stable cardiomediastinal silhouette with mild cardiomegaly. New diffuse mild interstitial thickening. No focal consolidation, pleural effusion, or pneumothorax. No acute osseous abnormality. IMPRESSION: 1. New mild interstitial pulmonary edema. Electronically Signed   By: Obie Dredge M.D.   On: 07/16/2021 15:14    Procedures .Critical Care Performed by: Pricilla Loveless, MD Authorized by: Pricilla Loveless, MD   Critical care provider statement:    Critical care time (minutes):  45   Critical care time was exclusive of:  Separately billable procedures and treating other patients   Critical care was necessary to treat or prevent imminent or life-threatening deterioration of the following conditions:  Sepsis and respiratory failure   Critical care was time spent personally by me on the following activities:  Development of treatment plan with patient or surrogate, evaluation of patient's response to treatment, examination of patient, obtaining history from patient or surrogate, ordering and performing treatments and interventions, ordering and review of  laboratory studies, ordering and review of radiographic studies, pulse oximetry and re-evaluation of patient's condition   Medications Ordered in ED Medications  vancomycin (VANCOREADY) IVPB 1250 mg/250 mL (has no administration in time range)  ceFEPIme (MAXIPIME) 2 g in sodium chloride 0.9 %  100 mL IVPB (has no administration in time range)  vancomycin (VANCOCIN) IVPB 1000 mg/200 mL premix (has no administration in time range)  sodium chloride 0.9 % bolus 1,000 mL (0 mLs Intravenous Stopped 07/16/21 1515)    ED Course  I have reviewed the triage vital signs and the nursing notes.  Pertinent labs & imaging results that were available during my care of the patient were reviewed by me and considered in my medical decision making (see chart for details).  Clinical Course as of 07/16/21 1559  Mon Jul 16, 2021  1329 Patient is moving all 4 extremities though not really following commands.  Given he is a DO NOT RESUSCITATE will not intubate him right now.  He is breathing on his own.  Otherwise, while it does sound like he had an acute change in mental status, this is very similar to his most recent 2 admissions and so I do not think stroke is likely or that this is a code stroke with no focal deficits. [SG]    Clinical Course User Index [SG] Pricilla Loveless, MD   MDM Rules/Calculators/A&P                           Patient's altered mental status is of unclear etiology at this point.  CT head is unremarkable.  Questionable edema on his chest x-ray.  We will give antibiotics as he has had sepsis with similar presentations in the past.  Otherwise he seem to be tender on abdominal exam so CT is currently pending.  I have asked the nurse to check a temperature as well as this was not originally documented.  Otherwise, care to Dr. Charm Barges. Final Clinical Impression(s) / ED Diagnoses Final diagnoses:  None    Rx / DC Orders ED Discharge Orders     None        Pricilla Loveless, MD 07/16/21  1559

## 2021-07-16 NOTE — Progress Notes (Addendum)
Pharmacy Antibiotic Note  Robert Hurst is a 87 y.o. male admitted on 07/16/2021 with pneumonia.  Pharmacy has been consulted for Vancomycin and Cefepime dosing.  Plan: Vancomycin 1250 mg IV x 1 dose Vancomycin 1000 mg IV every 48 hours. Cefepime 2000 mg IV every 24 hours. Monitor labs, c/s, and vanco level as indicated.  Height: 5\' 5"  (165.1 cm) Weight: 55.5 kg (122 lb 5.7 oz) IBW/kg (Calculated) : 61.5  No data recorded.  Recent Labs  Lab 07/16/21 1423 07/16/21 1516  WBC 9.9  --   CREATININE  --  1.68*    Estimated Creatinine Clearance: 24.3 mL/min (A) (by C-G formula based on SCr of 1.68 mg/dL (H)).    Allergies  Allergen Reactions   Penicillins Other (See Comments)    Unknown reaction    Antimicrobials this admission: Vanco 10/24 >> Cefepime 10/24 >>  Microbiology results: 10/24 BCx: pending 10/24 UCx: pending  10/24 MRSA PCR: pending  Thank you for allowing pharmacy to be a part of this patient's care.  11/24 07/16/2021 3:52 PM

## 2021-07-16 NOTE — Progress Notes (Signed)
MD aware of patient's repeat lactic acid.

## 2021-07-16 NOTE — ED Triage Notes (Signed)
Pt brought in by [facility] from [facility] with c/o becoming unresponsive while eating lunch today. EMS arrived and reports ETCO2 was O, HR 40. When EMS started bagging him he started to have purposeful movement.

## 2021-07-16 NOTE — Sepsis Progress Note (Signed)
[facility] monitoring sepsis protocol 

## 2021-07-16 NOTE — Sepsis Progress Note (Signed)
Requested order for third LA from attending MD

## 2021-07-16 NOTE — H&P (Signed)
History and Physical    Robert Hurst EUM:[id number] DOB: 1932-01-20 DOA: 07/16/2021  PCP: [name], MD   Patient coming from: [facility]  I have personally briefly reviewed patient's old medical records in [facility] Link  Chief Complaint: Brief episodes of unresponsiveness.  HPI: Robert Hurst is a 87 y.o. male with PMH significant of Alzheimer's disease, history of DVT not on any anticoagulation, CKD stage IIIa, hypothyroidism, depression presented in the emergency department with altered mental status from [facility].  Patient was found unresponsive by nursing home staff after lunch today.  After EMS arrived, they have to bag him briefly for respiratory distress, breathing has improved.  Patient was placed on 3 L of supplemental oxygen sats improved .  Patient was awake but not following commands. Nursing home denied any fever, chills, urinary symptoms.  History is obtained from medical records and from ED physician.  Patient is unable to provide any history.  Patient is DNR/DNI.  ED Course: He was bradycardic , hypothermic other vitals were stable. HR 57, BP 111/89, RR 13, temp 96.4, SPO2 100% on 2 L Labs include sodium 142, potassium 4.5, chloride 108, bicarb 22, glucose 118, BUN 18, creatinine 1.68, calcium 9.1, anion gap 12, alkaline phosphatase 85, AST 20, ALT 11, total protein 7.6, total bilirubin 0.8, troponin 16, lactic acid 4.1.  4.2, WBC 9.9, hemoglobin 17.0, hematocrit 51.5, MCV 91.3, platelet 216, UA unremarkable, CT head unremarkable, CT abdomen pelvis uncomplicated left nephrolithiasis. Chest x-ray mild interstitial pulmonary edema.  Review of Systems:  Review of Systems  HENT: Negative.    Eyes: Negative.   Respiratory: Negative.    Cardiovascular: Negative.   Gastrointestinal: Negative.   Genitourinary: Negative.   Musculoskeletal: Negative.   Skin: Negative.   Neurological:  Positive for loss of consciousness and weakness.   Psychiatric/Behavioral: Negative.     Past Medical History:  Diagnosis Date   Alzheimer disease (HCC)    Calculus of bile duct (any) with acute cholecystitis    Cognitive communication deficit    COVID-19    Dementia (HCC)    DVT (deep venous thrombosis) (HCC)    Dysphagia    Malnourished (HCC)    Metabolic encephalopathy    Protein calorie malnutrition (HCC)    Renal disorder    Syncope and collapse    Thyroid disease     Past Surgical History:  Procedure Laterality Date   APPENDECTOMY     BALLOON DILATION N/A 01/15/2018   Procedure: BALLOON DILATION;  Surgeon: Malissa Hippo, MD;  [facility];  Service: Endoscopy;  Laterality: N/A;   ERCP N/A 01/15/2018   Procedure: ENDOSCOPIC RETROGRADE CHOLANGIOPANCREATOGRAPHY (ERCP);  Surgeon: Malissa Hippo, MD;  Location: [facility];  Service: Endoscopy;  Laterality: N/A;   REMOVAL OF STONES N/A 01/15/2018   Procedure: REMOVAL OF STONES;  Surgeon: Malissa Hippo, MD;  Location: [facility];  Service: Endoscopy;  Laterality: N/A;   SPHINCTEROTOMY N/A 01/15/2018   Procedure: SPHINCTEROTOMY;  Surgeon: Malissa Hippo, MD;  Location: [facility];  Service: Endoscopy;  Laterality: N/A;   TONSILLECTOMY       reports that he has never smoked. He has never used smokeless tobacco. He reports that he does not currently use alcohol. He reports that he does not currently use drugs.  Allergies  Allergen Reactions   Penicillins Other (See Comments)    Unknown reaction    History reviewed. No pertinent family history. Family history reviewed and  not pertinent.  Prior to Admission medications   Medication Sig Start Date End Date Taking? Authorizing Provider  acetaminophen (TYLENOL) 325 MG tablet Take 2 tablets (650 mg total) by mouth every 6 (six) hours as needed for mild pain, fever or headache (fever >/= 101). 05/26/20  Yes Johnson, Clanford L, MD  cyanocobalamin (,VITAMIN B-12,) 1000 MCG/ML injection Inject 1 mL (1,000  mcg total) into the muscle every 30 (thirty) days. 07/18/21  Yes Shon Hale, MD  levothyroxine (SYNTHROID) 25 MCG tablet Take 1 tablet (25 mcg total) by mouth daily before breakfast. 06/27/21  Yes Emokpae, Courage, MD  memantine (NAMENDA) 10 MG tablet Take 10 mg by mouth 2 (two) times daily.   Yes [provider]  ondansetron (ZOFRAN) 4 MG tablet Take 1 tablet (4 mg total) by mouth every 6 (six) hours as needed for nausea. 02/13/21  Yes Emokpae, Courage, MD  Potassium Chloride ER 20 MEQ TBCR Take 20 mEq by mouth daily. 1 tab daily by mouth 02/13/21  Yes Emokpae, Courage, MD  senna-docusate (SENOKOT-S) 8.6-50 MG tablet Take 2 tablets by mouth at bedtime. 06/27/21 06/27/22 Yes Emokpae, Courage, MD  feeding supplement (ENSURE ENLIVE / ENSURE PLUS) LIQD Take 237 mLs by mouth 3 (three) times daily. 02/13/21   Shon Hale, MD  feeding supplement, ENSURE COMPLETE, (ENSURE COMPLETE) LIQD Take 237 mLs by mouth 2 (two) times daily between meals. 06/27/21   Shon Hale, MD    Physical Exam: Vitals:   07/16/21 1600 07/16/21 1648 07/16/21 1700 07/16/21 1730  BP: 126/64  121/68 111/89  Pulse: (!) 50  (!) 56 (!) 57  Resp: 10  14 13   Temp:  (!) 96.4 F (35.8 C)    TempSrc:  Rectal    SpO2: 98%  100% 100%  Weight:      Height:        Constitutional: Appears comfortable but not participating in conversation. Vitals:   07/16/21 1600 07/16/21 1648 07/16/21 1700 07/16/21 1730  BP: 126/64  121/68 111/89  Pulse: (!) 50  (!) 56 (!) 57  Resp: 10  14 13   Temp:  (!) 96.4 F (35.8 C)    TempSrc:  Rectal    SpO2: 98%  100% 100%  Weight:      Height:       Eyes: PERRL, lids and conjunctivae normal ENMT: Mucous membranes are moist.  Posterior pharynx without exudate. Neck: normal, supple, no masses, no thyromegaly Respiratory: clear to auscultation bilaterally, no wheezing, no crackles. No accessory muscle use.  Cardiovascular: Regular rate and rhythm, no murmurs / rubs / gallops. 2+  pedal pulses. No carotid bruits.  Abdomen: Abdomen soft, no tenderness, nondistended, BS+ Musculoskeletal: no clubbing / cyanosis. No joint deformity upper and lower extremities. Good ROM, no contractures. Normal muscle tone.  Skin: no rashes, lesions, ulcers. No induration Neurologic: Moves all 4 extremities.  Opens eyes on painful stimuli. Psychiatric: Normal judgment and insight. Alert and oriented x 3. Normal mood.     Labs on Admission: I have personally reviewed following labs and imaging studies  CBC: Recent Labs  Lab 07/16/21 1423  WBC 9.9  NEUTROABS 3.8  HGB 17.0  HCT 51.5  MCV 91.3  PLT 216   Basic Metabolic Panel: Recent Labs  Lab 07/16/21 1516  NA 142  K 4.5  CL 108  CO2 22  GLUCOSE 118*  BUN 18  CREATININE 1.68*  CALCIUM 9.1   GFR: Estimated Creatinine Clearance: 24.3 mL/min (A) (by C-G formula based on SCr  of 1.68 mg/dL (H)). Liver Function Tests: Recent Labs  Lab 07/16/21 1516  AST 20  ALT 11  ALKPHOS 85  BILITOT 0.8  PROT 7.6  ALBUMIN 3.8   No results for input(s): LIPASE, AMYLASE in the last 168 hours. No results for input(s): AMMONIA in the last 168 hours. Coagulation Profile: Recent Labs  Lab 07/16/21 1516  INR 1.1   Cardiac Enzymes: No results for input(s): CKTOTAL, CKMB, CKMBINDEX, TROPONINI in the last 168 hours. BNP (last 3 results) No results for input(s): PROBNP in the last 8760 hours. HbA1C: No results for input(s): HGBA1C in the last 72 hours. CBG: Recent Labs  Lab 07/16/21 1436  GLUCAP 96   Lipid Profile: No results for input(s): CHOL, HDL, LDLCALC, TRIG, CHOLHDL, LDLDIRECT in the last 72 hours. Thyroid Function Tests: No results for input(s): TSH, T4TOTAL, FREET4, T3FREE, THYROIDAB in the last 72 hours. Anemia Panel: No results for input(s): VITAMINB12, FOLATE, FERRITIN, TIBC, IRON, RETICCTPCT in the last 72 hours. Urine analysis:    Component Value Date/Time   COLORURINE YELLOW 07/16/2021 1752   APPEARANCEUR  CLEAR 07/16/2021 1752   LABSPEC 1.026 07/16/2021 1752   PHURINE 6.0 07/16/2021 1752   GLUCOSEU NEGATIVE 07/16/2021 1752   HGBUR NEGATIVE 07/16/2021 1752   BILIRUBINUR NEGATIVE 07/16/2021 1752   KETONESUR NEGATIVE 07/16/2021 1752   PROTEINUR NEGATIVE 07/16/2021 1752   NITRITE NEGATIVE 07/16/2021 1752   LEUKOCYTESUR TRACE (A) 07/16/2021 1752    Radiological Exams on Admission: CT Head Wo Contrast  Result Date: 07/16/2021 CLINICAL DATA:  Mental status change, unknown cause EXAM: CT HEAD WITHOUT CONTRAST TECHNIQUE: Contiguous axial images were obtained from the base of the skull through the vertex without intravenous contrast. COMPARISON:  06/24/2021 FINDINGS: Brain: There is no acute intracranial hemorrhage, mass effect, or edema. No new loss of gray-white differentiation. There is no extra-axial fluid collection. Ventricles and sulci are stable in size and configuration. Patchy and confluent hypoattenuation in the supratentorial white matter probably reflects stable chronic microvascular ischemic changes. Small chronic left occipital infarct. Vascular: There is atherosclerotic calcification at the skull base. Skull: Calvarium is unremarkable. Sinuses/Orbits: No acute finding. Other: None. IMPRESSION: No acute intracranial abnormality. Electronically Signed   By: Guadlupe Spanish M.D.   On: 07/16/2021 15:31   CT ABDOMEN PELVIS W CONTRAST  Result Date: 07/16/2021 CLINICAL DATA:  Nonlocalized acute abdominal pain. becoming unresponsive while eating lunch today. EXAM: CT ABDOMEN AND PELVIS WITH CONTRAST TECHNIQUE: Multidetector CT imaging of the abdomen and pelvis was performed using the standard protocol following bolus administration of intravenous contrast. CONTRAST:  30mL OMNIPAQUE IOHEXOL 300 MG/ML  SOLN COMPARISON:  CT abdomen pelvis 06/24/2021 FINDINGS: Lower chest: Bibasilar atelectasis. Persistent marked noncalcified plaque of the descending thoracic aorta with more than 40% occlusion of the  visualized thoracic aorta lumen. Hepatobiliary: No focal liver abnormality. Status post cholecystectomy. No biliary dilatation. Persistent pneumobilia. Pancreas: No focal lesion. Normal pancreatic contour. No surrounding inflammatory changes. No main pancreatic ductal dilatation. Spleen: Normal in size without focal abnormality. Adrenals/Urinary Tract: No adrenal nodule bilaterally. Bilateral kidneys enhance symmetrically. There is an at least 1.7 cm persistent calcified stone within the right kidney. There is a 0.9 cm calcified stone within the left kidney. Several simple renal cyst with query of a couple of renal lesions demonstrating a density higher than 20 Hounsfield units (3:39, 40). No hydronephrosis. No hydroureter. The urinary bladder is unremarkable. Couple of urinary bladder diverticula are noted. Stomach/Bowel: Stomach is within normal limits. No evidence of bowel wall  thickening or dilatation. Appendix appears normal. Vascular/Lymphatic: No abdominal aorta or iliac aneurysm. Moderate severe atherosclerotic plaque of the aorta and its branches. No abdominal, pelvic, or inguinal lymphadenopathy. Reproductive: The prostate is enlarged measuring up to 6 cm. Other: No intraperitoneal free fluid. No intraperitoneal free gas. No organized fluid collection. Musculoskeletal: No abdominal wall hernia or abnormality. Densely sclerotic lesion is again noted within the left iliac bone and likely represents a bone island. No suspicious lytic or blastic osseous lesions. No acute displaced fracture. Multilevel degenerative changes of the spine. IMPRESSION: 1. Nonobstructive 1.7 cm right and 0.9 cm left nephrolithiasis. 2. Bilateral simple renal cysts with a couple of indeterminate renal lesions. Findings possibly due to artifact in the setting of motion. Recommend MRI renal protocol further evaluation. When the patient is clinically stable and able to follow directions and hold their breath (preferably as an  outpatient) further evaluation with dedicated abdominal MRI should be considered. 3. Prostatomegaly with mass effect on the posterior wall of urinary bladder. 4. Aortic Atherosclerosis (ICD10-I70.0) with persistent marked noncalcified plaque of the descending thoracic aorta with more than 40% occlusion of the visualized lumen. Electronically Signed   By: Tish Frederickson M.D.   On: 07/16/2021 17:16   DG Chest Port 1 View  Result Date: 07/16/2021 CLINICAL DATA:  Unresponsive. EXAM: PORTABLE CHEST 1 VIEW COMPARISON:  Chest x-ray dated June 24, 2021. FINDINGS: Stable cardiomediastinal silhouette with mild cardiomegaly. New diffuse mild interstitial thickening. No focal consolidation, pleural effusion, or pneumothorax. No acute osseous abnormality. IMPRESSION: 1. New mild interstitial pulmonary edema. Electronically Signed   By: Obie Dredge M.D.   On: 07/16/2021 15:14    EKG: EKG ordered. please follow-up.  Assessment/Plan Principal Problem:   Episode of unresponsiveness Active Problems:   Dementia without behavioral disturbance (HCC)   Lactic acidosis   CKD (chronic kidney disease), stage III (HCC)   Hypothyroidism   History of DVT (deep vein thrombosis)    Altered mental status /Acute encephalopathy: Patient presented with brief episodes of unresponsiveness following lunch at the nursing home today. He was hypoxic, EMS has to bag him briefly, patient awake but not following commands. This could be due to hypoxia, occult infection, UTI. CT head unremarkable.  Chest x-ray  showed Mild interstitial pulmonary edema. ABG pH 7.3, PCO2 45, PO2 141, remains afebrile. UA  small leucocytes, will treat as presumed sepsis due to occult infection Flu and COVID negative. Patient opens eyes with painful stimuli, moves all extremities. Obtain MRI when patient is more alert and cooperative. Consider neuro consult if patient continues to remain altered mentation.  Presumptive sepsis: Patient  presented with hypoxia, AMS,  lactic acidosis, could be UTI. Continue empiric vancomycin and cefepime Follow blood cultures, urine cultures.  Lactic acidosis: Could be secondary to occult infection Continue IV gentle hydration , trend lactic acid.  Acute hypoxic respiratory failure: Unknown whether patient is on oxygen at baseline or not. Patient is now on 2 L, SPO2 100%. Wean as tolerated.  Advanced dementia: Continue Namenda  Hypothyroidism: Continue levothyroxine  CKD Stage III: Serum creatinine baseline.  DVT prophylaxis: Lovenox Code Status: DNR Family Communication: No family at bedside Disposition Plan:   Status is: Inpatient  Remains inpatient appropriate because: Admitted for altered mentation.  Probable sepsis Anticipated discharge in few days.   Consults called: None Admission status: Inpatient   Cipriano Bunker MD [facility]   If 7PM-7AM, please contact night-coverage [email or web address] Password [email or web address]  07/16/2021, 8:00 PM

## 2021-07-17 DIAGNOSIS — E039 Hypothyroidism, unspecified: Secondary | ICD-10-CM

## 2021-07-17 DIAGNOSIS — E872 Acidosis, unspecified: Secondary | ICD-10-CM

## 2021-07-17 DIAGNOSIS — N1832 Chronic kidney disease, stage 3b: Secondary | ICD-10-CM

## 2021-07-17 DIAGNOSIS — F0390 Unspecified dementia without behavioral disturbance: Secondary | ICD-10-CM | POA: Diagnosis not present

## 2021-07-17 DIAGNOSIS — R4189 Other symptoms and signs involving cognitive functions and awareness: Secondary | ICD-10-CM | POA: Diagnosis not present

## 2021-07-17 LAB — CBC
HCT: 43.5 % (ref 39.0–52.0)
Hemoglobin: 14.3 g/dL (ref 13.0–17.0)
MCH: 30.6 pg (ref 26.0–34.0)
MCHC: 32.9 g/dL (ref 30.0–36.0)
MCV: 92.9 fL (ref 80.0–100.0)
Platelets: 150 10*3/uL (ref 150–400)
RBC: 4.68 MIL/uL (ref 4.22–5.81)
RDW: 15.2 % (ref 11.5–15.5)
WBC: 5.4 10*3/uL (ref 4.0–10.5)
nRBC: 0 % (ref 0.0–0.2)

## 2021-07-17 LAB — COMPREHENSIVE METABOLIC PANEL
ALT: 10 U/L (ref 0–44)
AST: 16 U/L (ref 15–41)
Albumin: 3.2 g/dL — ABNORMAL LOW (ref 3.5–5.0)
Alkaline Phosphatase: 68 U/L (ref 38–126)
Anion gap: 8 (ref 5–15)
BUN: 16 mg/dL (ref 8–23)
CO2: 23 mmol/L (ref 22–32)
Calcium: 8.7 mg/dL — ABNORMAL LOW (ref 8.9–10.3)
Chloride: 114 mmol/L — ABNORMAL HIGH (ref 98–111)
Creatinine, Ser: 1.33 mg/dL — ABNORMAL HIGH (ref 0.61–1.24)
GFR, Estimated: 52 mL/min — ABNORMAL LOW (ref >60)
Glucose, Bld: 91 mg/dL (ref 70–99)
Potassium: 4.2 mmol/L (ref 3.5–5.1)
Sodium: 145 mmol/L (ref 135–145)
Total Bilirubin: 0.8 mg/dL (ref 0.3–1.2)
Total Protein: 6.5 g/dL (ref 6.5–8.1)

## 2021-07-17 LAB — URINE CULTURE: Culture: >=100000 — AB

## 2021-07-17 LAB — PHOSPHORUS: Phosphorus: 2.6 mg/dL (ref 2.5–4.6)

## 2021-07-17 LAB — TROPONIN I (HIGH SENSITIVITY): Troponin I (High Sensitivity): 12 ng/L (ref <18)

## 2021-07-17 LAB — LACTIC ACID, PLASMA: Lactic Acid, Venous: 1.4 mmol/L (ref 0.5–1.9)

## 2021-07-17 LAB — MAGNESIUM: Magnesium: 1.9 mg/dL (ref 1.7–2.4)

## 2021-07-17 MED ORDER — CHLORHEXIDINE GLUCONATE CLOTH 2 % EX PADS
6.0000 | MEDICATED_PAD | Freq: Every day | CUTANEOUS | Status: DC
  Administered 2021-07-17 – 2021-07-18 (×2): 6 via TOPICAL

## 2021-07-17 MED ORDER — MUPIROCIN 2 % EX OINT
1.0000 "application " | TOPICAL_OINTMENT | Freq: Two times a day (BID) | CUTANEOUS | Status: DC
  Administered 2021-07-17 – 2021-07-18 (×3): 1 via NASAL
  Filled 2021-07-17: qty 22

## 2021-07-17 NOTE — Progress Notes (Signed)
PROGRESS NOTE    Robert Hurst  DJM:[id number] DOB: July 13, 1932 DOA: 07/16/2021 PCP: [name], MD   No chief complaint on file.   Brief Narrative:  As per H&P written by Dr. Lucianne Muss on 07/16/2021. Robert Hurst is a 87 y.o. male with PMH significant of Alzheimer's disease, history of DVT not on any anticoagulation, CKD stage IIIa, hypothyroidism, depression presented in the emergency department with altered mental status from [facility] nursing [facility].  Patient was found unresponsive by nursing home staff after lunch today.  After EMS arrived, they have to bag him briefly for respiratory distress, breathing has improved.  Patient was placed on 3 L of supplemental oxygen sats improved .  Patient was awake but not following commands. Nursing home denied any fever, chills, urinary symptoms.  History is obtained from medical records and from ED physician.  Patient is unable to provide any history.  Patient is DNR/DNI.   ED Course: He was bradycardic , hypothermic other vitals were stable. HR 57, BP 111/89, RR 13, temp 96.4, SPO2 100% on 2 L Labs include sodium 142, potassium 4.5, chloride 108, bicarb 22, glucose 118, BUN 18, creatinine 1.68, calcium 9.1, anion gap 12, alkaline phosphatase 85, AST 20, ALT 11, total protein 7.6, total bilirubin 0.8, troponin 16, lactic acid 4.1.  4.2, WBC 9.9, hemoglobin 17.0, hematocrit 51.5, MCV 91.3, platelet 216, UA unremarkable, CT head unremarkable, CT abdomen pelvis uncomplicated left nephrolithiasis. Chest x-ray mild interstitial pulmonary edema.  Assessment & Plan: 1-episode of unresponsiveness -Unclear etiology -No acute infection appreciated -Procalcitonin less than 0.10 -will start weaning off antibiotics -sepsis essentially rule out -continue to maintain adequate nutrition and hydration -will transfer to telemetry bed. -so far cultures without growth.  2-Dementia without behavioral disturbance (HCC) -continue supportive  care  3-hypothyroidism -continue synthroid   4-Lactic acidosis -resolved after IVF's given  5-CKD (chronic kidney disease), stage III (HCC) -Stage IIIb at baseline -Appears to be stable and compensated -Continue to follow renal function trend.   DVT prophylaxis: Lovenox Code Status: DNR/DNI. Family Communication: No family at bedside. Disposition:   Status is: Inpatient  Remains inpatient appropriate because: Patient presented with worsening mentation and significant lactic acidosis; there was concern for occult infection leading to sepsis.  Weaning off antibiotics currently.       Consultants:  None  Procedures:  See below for x-ray reports  Antimicrobials:  Vancomycin and cefepime   Subjective: Afebrile, no chest pain, nausea, no vomiting.  Pleasantly confused.  In no major distress.  Objective: Vitals:   07/17/21 0436 07/17/21 0504 07/17/21 1205 07/17/21 1641  BP:  123/62    Pulse:  62    Resp:  15    Temp: 98.3 F (36.8 C)  (!) 97.3 F (36.3 C) 97.7 F (36.5 C)  TempSrc: Axillary  Axillary Axillary  SpO2:  95%    Weight:      Height:        Intake/Output Summary (Last 24 hours) at 07/17/2021 1841 Last data filed at 07/17/2021 1828 Gross per 24 hour  Intake 752.88 ml  Output 1400 ml  Net -647.12 ml   Filed Weights   07/16/21 1339 07/17/21 0000  Weight: 55.5 kg 55.6 kg    Examination:  General exam: Appears calm and comfortable; reports no chest pain, no nausea, no vomiting.  Patient is afebrile. Respiratory system: Clear to auscultation. Respiratory effort normal.  No requiring oxygen supplementation. Cardiovascular system: S1 & S2 heard, RRR. No JVD, no rubs, no gallops or clicks.  No pedal edema.  Gastrointestinal system: Abdomen is nondistended, soft and nontender. No organomegaly or masses felt. Normal bowel sounds heard. Central nervous system: Alert and oriented. No new focal neurological deficits. Extremities: No cyanosis or  clubbing. Skin: No petechiae Psychiatry: Mood & affect appropriate.     Data Reviewed: I have personally reviewed following labs and imaging studies  CBC: Recent Labs  Lab 07/16/21 1423 07/16/21 1951 07/17/21 0449  WBC 9.9 6.2 5.4  NEUTROABS 3.8  --   --   HGB 17.0 15.1 14.3  HCT 51.5 46.8 43.5  MCV 91.3 92.1 92.9  PLT 216 151 150    Basic Metabolic Panel: Recent Labs  Lab 07/16/21 1516 07/16/21 1951 07/17/21 0449  NA 142  --  145  K 4.5  --  4.2  CL 108  --  114*  CO2 22  --  23  GLUCOSE 118*  --  91  BUN 18  --  16  CREATININE 1.68* 1.42* 1.33*  CALCIUM 9.1  --  8.7*  MG  --   --  1.9  PHOS  --   --  2.6    GFR: Estimated Creatinine Clearance: 30.8 mL/min (A) (by C-G formula based on SCr of 1.33 mg/dL (H)).  Liver Function Tests: Recent Labs  Lab 07/16/21 1516 07/17/21 0449  AST 20 16  ALT 11 10  ALKPHOS 85 68  BILITOT 0.8 0.8  PROT 7.6 6.5  ALBUMIN 3.8 3.2*    CBG: Recent Labs  Lab 07/16/21 1436  GLUCAP 96     Recent Results (from the past 240 hour(s))  Resp Panel by RT-PCR (Flu A&B, Covid) Nasopharyngeal Swab     Status: None   Collection Time: 07/16/21  1:53 PM   Specimen: Nasopharyngeal Swab; Nasopharyngeal(NP) swabs in vial transport medium  Result Value Ref Range Status   SARS Coronavirus 2 by RT PCR NEGATIVE NEGATIVE Final    Comment: (NOTE) SARS-CoV-2 target nucleic acids are NOT DETECTED.  The SARS-CoV-2 RNA is generally detectable in upper respiratory specimens during the acute phase of infection. The lowest concentration of SARS-CoV-2 viral copies this assay can detect is 138 copies/mL. A negative result does not preclude SARS-Cov-2 infection and should not be used as the sole basis for treatment or other patient management decisions. A negative result may occur with  improper specimen collection/handling, submission of specimen other than nasopharyngeal swab, presence of viral mutation(s) within the areas targeted by this  assay, and inadequate number of viral copies(<138 copies/mL). A negative result must be combined with clinical observations, patient history, and epidemiological information. The expected result is Negative.  Fact Sheet for Patients:  BloggerCourse.com  Fact Sheet for Healthcare Providers:  SeriousBroker.it  This test is no t yet approved or cleared by the [place] FDA and  has been authorized for detection and/or diagnosis of SARS-CoV-2 by FDA under an Emergency Use Authorization (EUA). This EUA will remain  in effect (meaning this test can be used) for the duration of the COVID-19 declaration under Section 564(b)(1) of the Act, 21 U.S.C.section 360bbb-3(b)(1), unless the authorization is terminated  or revoked sooner.       Influenza A by PCR NEGATIVE NEGATIVE Final   Influenza B by PCR NEGATIVE NEGATIVE Final    Comment: (NOTE) The Xpert Xpress SARS-CoV-2/FLU/RSV plus assay is intended as an aid in the diagnosis of influenza from Nasopharyngeal swab specimens and should not be used as a sole basis for treatment. Nasal washings and aspirates are unacceptable  for Xpert Xpress SARS-CoV-2/FLU/RSV testing.  Fact Sheet for Patients: BloggerCourse.com  Fact Sheet for Healthcare Providers: SeriousBroker.it  This test is not yet approved or cleared by the [place] FDA and has been authorized for detection and/or diagnosis of SARS-CoV-2 by FDA under an Emergency Use Authorization (EUA). This EUA will remain in effect (meaning this test can be used) for the duration of the COVID-19 declaration under Section 564(b)(1) of the Act, 21 U.S.C. section 360bbb-3(b)(1), unless the authorization is terminated or revoked.  Performed at [facility], [address], [place], [place] [address]   Blood Culture (routine x 2)     Status: None (Preliminary result)   Collection Time:  07/16/21  2:27 PM   Specimen: BLOOD LEFT HAND  Result Value Ref Range Status   Specimen Description BLOOD LEFT HAND  Final   Special Requests   Final    BOTTLES DRAWN AEROBIC AND ANAEROBIC Blood Culture results may not be optimal due to an inadequate volume of blood received in culture bottles   Culture   Final    NO GROWTH < 24 HOURS Performed at [facility], [address], [place], [place] [address]    Report Status PENDING  Incomplete  Blood Culture (routine x 2)     Status: None (Preliminary result)   Collection Time: 07/16/21  2:27 PM   Specimen: BLOOD RIGHT HAND  Result Value Ref Range Status   Specimen Description BLOOD RIGHT HAND  Final   Special Requests   Final    BOTTLES DRAWN AEROBIC AND ANAEROBIC Blood Culture results may not be optimal due to an inadequate volume of blood received in culture bottles   Culture   Final    NO GROWTH < 24 HOURS Performed at [facility], [address] [place], [place] [address]    Report Status PENDING  Incomplete     Radiology Studies: CT Head Wo Contrast  Result Date: 07/16/2021 CLINICAL DATA:  Mental status change, unknown cause EXAM: CT HEAD WITHOUT CONTRAST TECHNIQUE: Contiguous axial images were obtained from the base of the skull through the vertex without intravenous contrast. COMPARISON:  06/24/2021 FINDINGS: Brain: There is no acute intracranial hemorrhage, mass effect, or edema. No new loss of gray-white differentiation. There is no extra-axial fluid collection. Ventricles and sulci are stable in size and configuration. Patchy and confluent hypoattenuation in the supratentorial white matter probably reflects stable chronic microvascular ischemic changes. Small chronic left occipital infarct. Vascular: There is atherosclerotic calcification at the skull base. Skull: Calvarium is unremarkable. Sinuses/Orbits: No acute finding. Other: None. IMPRESSION: No acute intracranial abnormality. Electronically Signed   By: Guadlupe Spanish M.D.    On: 07/16/2021 15:31   CT ABDOMEN PELVIS W CONTRAST  Result Date: 07/16/2021 CLINICAL DATA:  Nonlocalized acute abdominal pain. becoming unresponsive while eating lunch today. EXAM: CT ABDOMEN AND PELVIS WITH CONTRAST TECHNIQUE: Multidetector CT imaging of the abdomen and pelvis was performed using the standard protocol following bolus administration of intravenous contrast. CONTRAST:  16mL OMNIPAQUE IOHEXOL 300 MG/ML  SOLN COMPARISON:  CT abdomen pelvis 06/24/2021 FINDINGS: Lower chest: Bibasilar atelectasis. Persistent marked noncalcified plaque of the descending thoracic aorta with more than 40% occlusion of the visualized thoracic aorta lumen. Hepatobiliary: No focal liver abnormality. Status post cholecystectomy. No biliary dilatation. Persistent pneumobilia. Pancreas: No focal lesion. Normal pancreatic contour. No surrounding inflammatory changes. No main pancreatic ductal dilatation. Spleen: Normal in size without focal abnormality. Adrenals/Urinary Tract: No adrenal nodule bilaterally. Bilateral kidneys enhance symmetrically. There is an  at least 1.7 cm persistent calcified stone within the right kidney. There is a 0.9 cm calcified stone within the left kidney. Several simple renal cyst with query of a couple of renal lesions demonstrating a density higher than 20 Hounsfield units (3:39, 40). No hydronephrosis. No hydroureter. The urinary bladder is unremarkable. Couple of urinary bladder diverticula are noted. Stomach/Bowel: Stomach is within normal limits. No evidence of bowel wall thickening or dilatation. Appendix appears normal. Vascular/Lymphatic: No abdominal aorta or iliac aneurysm. Moderate severe atherosclerotic plaque of the aorta and its branches. No abdominal, pelvic, or inguinal lymphadenopathy. Reproductive: The prostate is enlarged measuring up to 6 cm. Other: No intraperitoneal free fluid. No intraperitoneal free gas. No organized fluid collection. Musculoskeletal: No abdominal wall  hernia or abnormality. Densely sclerotic lesion is again noted within the left iliac bone and likely represents a bone island. No suspicious lytic or blastic osseous lesions. No acute displaced fracture. Multilevel degenerative changes of the spine. IMPRESSION: 1. Nonobstructive 1.7 cm right and 0.9 cm left nephrolithiasis. 2. Bilateral simple renal cysts with a couple of indeterminate renal lesions. Findings possibly due to artifact in the setting of motion. Recommend MRI renal protocol further evaluation. When the patient is clinically stable and able to follow directions and hold their breath (preferably as an outpatient) further evaluation with dedicated abdominal MRI should be considered. 3. Prostatomegaly with mass effect on the posterior wall of urinary bladder. 4. Aortic Atherosclerosis (ICD10-I70.0) with persistent marked noncalcified plaque of the descending thoracic aorta with more than 40% occlusion of the visualized lumen. Electronically Signed   By: Tish Frederickson M.D.   On: 07/16/2021 17:16   DG Chest Port 1 View  Result Date: 07/16/2021 CLINICAL DATA:  Unresponsive. EXAM: PORTABLE CHEST 1 VIEW COMPARISON:  Chest x-ray dated June 24, 2021. FINDINGS: Stable cardiomediastinal silhouette with mild cardiomegaly. New diffuse mild interstitial thickening. No focal consolidation, pleural effusion, or pneumothorax. No acute osseous abnormality. IMPRESSION: 1. New mild interstitial pulmonary edema. Electronically Signed   By: Obie Dredge M.D.   On: 07/16/2021 15:14     Scheduled Meds:  Chlorhexidine Gluconate Cloth  6 each Topical Daily   docusate sodium  100 mg Oral BID   enoxaparin (LOVENOX) injection  30 mg Subcutaneous Q24H   feeding supplement  237 mL Oral TID   levothyroxine  25 mcg Oral QAC breakfast   memantine  10 mg Oral BID   mupirocin ointment  1 application Nasal BID   senna-docusate  2 tablet Oral QHS   Continuous Infusions:  sodium chloride 75 mL/hr at 07/17/21 0919    ceFEPime (MAXIPIME) IV 2 g (07/17/21 1714)     LOS: 1 day    Time spent: 35 minutes    Vassie Loll, MD [facility]   To contact the attending provider between 7A-7P or the covering provider during after hours 7P-7A, please log into the web site [email or web address] and access using universal  password for that web site. If you do not have the password, please call the hospital operator.  07/17/2021, 6:41 PM

## 2021-07-17 NOTE — Evaluation (Addendum)
Clinical/Bedside Swallow Evaluation Patient Details  Name: Robert Hurst MRN: [id number] Date of Birth: 1932-04-14  Today's Date: 07/17/2021 Time: SLP Start Time (ACUTE ONLY): 1230 SLP Stop Time (ACUTE ONLY): 1252 SLP Time Calculation (min) (ACUTE ONLY): 22 min  Past Medical History:  Past Medical History:  Diagnosis Date   Alzheimer disease (HCC)    Calculus of bile duct (any) with acute cholecystitis    Cognitive communication deficit    COVID-19    Dementia (HCC)    DVT (deep venous thrombosis) (HCC)    Dysphagia    Malnourished (HCC)    Metabolic encephalopathy    Protein calorie malnutrition (HCC)    Renal disorder    Syncope and collapse    Thyroid disease    Past Surgical History:  Past Surgical History:  Procedure Laterality Date   APPENDECTOMY     BALLOON DILATION N/A 01/15/2018   Procedure: BALLOON DILATION;  Surgeon: Malissa Hippo, MD;  [facility];  Service: Endoscopy;  Laterality: N/A;   ERCP N/A 01/15/2018   Procedure: ENDOSCOPIC RETROGRADE CHOLANGIOPANCREATOGRAPHY (ERCP);  Surgeon: Malissa Hippo, MD;  Location: [facility];  Service: Endoscopy;  Laterality: N/A;   REMOVAL OF STONES N/A 01/15/2018   Procedure: REMOVAL OF STONES;  Surgeon: Malissa Hippo, MD;  [facility];  Service: Endoscopy;  Laterality: N/A;   SPHINCTEROTOMY N/A 01/15/2018   Procedure: SPHINCTEROTOMY;  Surgeon: Malissa Hippo, MD;  [facility];  Service: Endoscopy;  Laterality: N/A;   TONSILLECTOMY     HPI:  87 y.o. male with PMH significant of Alzheimer's disease, history of DVT not on any anticoagulation, CKD stage IIIa, hypothyroidism, depression presented in the emergency department with altered mental status from [facility] nursing [facility].  Patient was found unresponsive by nursing home staff after lunch today.  After EMS arrived, they have to bag him briefly for respiratory distress, breathing has improved.  Patient was placed on 3 L of supplemental  oxygen sats improved .  Patient was awake but not following commands. CXR on 07/16/21 indicated New mild interstitial pulmonary edema; MRI negative for acute processes.  BSE generated as pt made NPO upon hospital admission d/t mentation level.   Assessment / Plan / Recommendation  Clinical Impression  Pt seen for clinical swallowing evaluation with cognitive-based dysphagia noted as pt exhibited oral holding and delay in the initiation of the swallow across consistencies assessed.  Mod verbal/tactile cues provided, pt was able to consume thin via straw with successive swallows being more efficient/puree consistencies without overt s/s of aspiration despite suboptimal positioning in pt bed (upright leaning to left).  Recommend initiating a conservative diet of Dysphagia 1/thin liquids with FULL supervision and medications crushed in puree.  ST will f/u in acute setting for diet tolerance/education for caregivers re: aspiration/swallowing precautions.  Thank you for this consult. SLP Visit Diagnosis: Dysphagia, unspecified (R13.10)    Aspiration Risk  Mild aspiration risk;Risk for inadequate nutrition/hydration    Diet Recommendation   Dysphagia 1/thin liquids  Medication Administration: Crushed with puree    Other  Recommendations Oral Care Recommendations: Oral care BID    Recommendations for follow up therapy are one component of a multi-disciplinary discharge planning process, led by the attending physician.  Recommendations may be updated based on patient status, additional functional criteria and insurance authorization.  Follow up Recommendations Skilled Nursing facility      Frequency and Duration min 1 x/week  1 week  Prognosis Prognosis for Safe Diet Advancement: Fair Barriers to Reach Goals: Cognitive deficits      Swallow Study   General Date of Onset: 07/16/21 HPI: 87 y.o. male with PMH significant of Alzheimer's disease, history of DVT not on any anticoagulation,  CKD stage IIIa, hypothyroidism, depression presented in the emergency department with altered mental status from [facility] nursing [facility].  Patient was found unresponsive by nursing home staff after lunch today.  After EMS arrived, they have to bag him briefly for respiratory distress, breathing has improved.  Patient was placed on 3 L of supplemental oxygen sats improved .  Patient was awake but not following commands. Type of Study: Bedside Swallow Evaluation Previous Swallow Assessment: n/a Diet Prior to this Study: NPO Temperature Spikes Noted: No Respiratory Status: Room air History of Recent Intubation: No Behavior/Cognition: Confused;Lethargic/Drowsy Oral Cavity Assessment: Other (comment) (DTA: grossly within normal limits) Oral Care Completed by SLP: Other (Comment) (Pt refused; agitated) Oral Cavity - Dentition: Missing dentition;Poor condition Self-Feeding Abilities: Total assist Patient Positioning: Other (comment) (Upright in bed; leaning to left) Baseline Vocal Quality: Low vocal intensity Volitional Cough: Cognitively unable to elicit Volitional Swallow: Unable to elicit    Oral/Motor/Sensory Function Overall Oral Motor/Sensory Function: Generalized oral weakness   Ice Chips Ice chips: Not tested   Thin Liquid Thin Liquid: Impaired Presentation: Straw Oral Phase Functional Implications: Oral holding Pharyngeal  Phase Impairments: Suspected delayed Swallow    Nectar Thick Nectar Thick Liquid: Not tested   Honey Thick Honey Thick Liquid: Not tested   Puree Puree: Impaired Presentation: Spoon Oral Phase Functional Implications: Oral holding Pharyngeal Phase Impairments: Suspected delayed Swallow   Solid     Solid: Not tested (Pt refused)      Tressie Stalker, M.S., CCC-SLP 07/17/2021,1:07 PM

## 2021-07-17 NOTE — Progress Notes (Signed)
Report called to 3rd floor RN Tiffany.

## 2021-07-18 DIAGNOSIS — R4189 Other symptoms and signs involving cognitive functions and awareness: Secondary | ICD-10-CM | POA: Diagnosis not present

## 2021-07-18 DIAGNOSIS — R7989 Other specified abnormal findings of blood chemistry: Secondary | ICD-10-CM | POA: Diagnosis not present

## 2021-07-18 DIAGNOSIS — N1832 Chronic kidney disease, stage 3b: Secondary | ICD-10-CM | POA: Diagnosis not present

## 2021-07-18 DIAGNOSIS — F0390 Unspecified dementia without behavioral disturbance: Secondary | ICD-10-CM | POA: Diagnosis not present

## 2021-07-18 LAB — CREATININE, SERUM
Creatinine, Ser: 1.34 mg/dL — ABNORMAL HIGH (ref 0.61–1.24)
GFR, Estimated: 51 mL/min — ABNORMAL LOW (ref >60)

## 2021-07-18 NOTE — TOC Transition Note (Signed)
Transition of Care [facility]) - CM/SW Discharge Note   Patient Details  Name: Mukesh Kornegay MRN: [id number] Date of Birth: 1932/05/12  [facility] of Care [facility]) CM/SW Contact:  Leitha Bleak, RN Phone Number: 07/18/2021, 3:11 PM   Clinical Narrative:   Patient is medically ready to go back to [facility]. Updated Family. Debbie provided room number. RN to call report. Medical necessity printed. EMS will be called when ready.   Final next level of care: Skilled Nursing Facility Barriers to Discharge: Barriers Resolved    Discharge Placement             Patient chooses bed at:  [facility]) Patient to be transferred to facility by: EMS Name of family member notified: [name]- Daughter Patient and family notified of of transfer: 07/18/21  Discharge Plan and Services   [facility]   Readmission Risk Interventions Readmission Risk Prevention Plan 07/18/2021 06/25/2021 02/12/2021  Medication Screening - Complete -  Transportation Screening Complete Complete Complete  PCP or Specialist Appt within 5-7 Days Complete - Complete  PCP or Specialist Appt within 3-5 Days - - -  Not Complete comments - - -  Home Care Screening Complete - Complete  Medication Review (RN CM) Complete - Complete  Social Work Consult for Recovery Care Planning/Counseling - - -  Palliative Care Screening - - -  Medication Review Oceanographer) - - -  Some recent data might be hidden

## 2021-07-18 NOTE — Discharge Summary (Signed)
Physician Discharge Summary  Dmarius Reeder ION:[id number] DOB: [birth year] DOA: 07/16/2021  PCP: [name], MD  Admit date: 07/16/2021 Discharge date: 07/18/2021  Time spent: 35 minutes  Recommendations for Outpatient Follow-up:  Repeat basic metabolic panel to follow twice renal function Maintain adequate hydration and assist patient with nutrition. Continue supportive care and arrange follow-up with PCP in the next 10 days.  Discharge Diagnoses:  Principal Problem:   Episode of unresponsiveness Active Problems:   Dementia without behavioral disturbance (HCC)   Lactic acidosis   CKD (chronic kidney disease), stage III (HCC)   Hypothyroidism   History of DVT (deep vein thrombosis)   Elevated lactic acid level   Discharge Condition: Stable and improved.  Discharged back to skilled nursing facility for further care and rehabilitation.  CODE STATUS: DNR/DNI  Diet recommendation: Dysphagia 1 diet.  Filed Weights   07/16/21 1339 07/17/21 0000  Weight: 55.5 kg 55.6 kg    History of present illness:  As per H&P written by Dr. Lucianne Muss on 07/16/2021. Robert Hurst is a 87 y.o. male with PMH significant of Alzheimer's disease, history of DVT not on any anticoagulation, CKD stage IIIa, hypothyroidism, depression presented in the emergency department with altered mental status from [facility].  Patient was found unresponsive by nursing home staff after lunch today.  After EMS arrived, they have to bag him briefly for respiratory distress, breathing has improved.  Patient was placed on 3 L of supplemental oxygen sats improved .  Patient was awake but not following commands. Nursing home denied any fever, chills, urinary symptoms.  History is obtained from medical records and from ED physician.  Patient is unable to provide any history.  Patient is DNR/DNI.   ED Course: He was bradycardic , hypothermic other vitals were stable. HR 57, BP 111/89, RR 13, temp 96.4, SPO2 100% on 2  L Labs include sodium 142, potassium 4.5, chloride 108, bicarb 22, glucose 118, BUN 18, creatinine 1.68, calcium 9.1, anion gap 12, alkaline phosphatase 85, AST 20, ALT 11, total protein 7.6, total bilirubin 0.8, troponin 16, lactic acid 4.1.  4.2, WBC 9.9, hemoglobin 17.0, hematocrit 51.5, MCV 91.3, platelet 216, UA unremarkable, CT head unremarkable, CT abdomen pelvis uncomplicated left nephrolithiasis. Chest x-ray mild interstitial pulmonary edema.  Hospital Course:  1-episode of unresponsiveness -Unclear etiology -No acute infection appreciated -Procalcitonin less than 0.10 -No clear source of infection appreciated; cultures remain negative -Antibiotics were discontinued -Patient remains afebrile and hemodynamically stable for over 24 hours prior to discharge. -Continue to maintain adequate hydration.   2-Dementia without behavioral disturbance (HCC) -continue supportive care -Resume the use of Namenda.   3-hypothyroidism -continue synthroid  -Follow thyroid panel in 8-2 weeks.   4-Lactic acidosis -resolved after IVF's given -Lactic acid at discharge 1.3.   5-CKD (chronic kidney disease), stage III (HCC) -Stage IIIb at baseline -Appears to be stable and compensated -Continue to follow renal function trend intermittently.  Procedures: See below for x-ray reports.  Consultations: None   Discharge Exam: Vitals:   07/18/21 0530 07/18/21 1240  BP: 129/60 138/62  Pulse: (!) 57 65  Resp: 14 18  Temp: 98.7 F (37.1 C) 98.4 F (36.9 C)  SpO2: 100% 96%    General: Appears calm and comfortable; pleasantly confused.  No nausea, no vomiting, no shortness of breath.  Patient is afebrile.  In no acute distress Cardiovascular: S1 and S2, no rubs, no gallops, no JVD. Respiratory: Good air movement bilaterally; no using accessory muscles.  Good saturation  on room air Abdomen: Soft, nontender, positive bowel sounds Extremities: No cyanosis or clubbing.  Discharge  Instructions   Discharge Instructions     Discharge instructions   Complete by: As directed    Arrange follow-up with PCP in 10 days Maintain adequate hydration Take medications as prescribed.   Increase activity slowly   Complete by: As directed       Allergies as of 07/18/2021       Reactions   Penicillins Other (See Comments)   Unknown reaction        Medication List     TAKE these medications    acetaminophen 325 MG tablet Commonly known as: TYLENOL Take 2 tablets (650 mg total) by mouth every 6 (six) hours as needed for mild pain, fever or headache (fever >/= 101).   cyanocobalamin 1000 MCG/ML injection Commonly known as: (VITAMIN B-12) Inject 1 mL (1,000 mcg total) into the muscle every 30 (thirty) days.   feeding supplement Liqd Take 237 mLs by mouth 3 (three) times daily. What changed: Another medication with the same name was removed. Continue taking this medication, and follow the directions you see here.   levothyroxine 25 MCG tablet Commonly known as: SYNTHROID Take 1 tablet (25 mcg total) by mouth daily before breakfast.   memantine 10 MG tablet Commonly known as: NAMENDA Take 10 mg by mouth 2 (two) times daily.   ondansetron 4 MG tablet Commonly known as: ZOFRAN Take 1 tablet (4 mg total) by mouth every 6 (six) hours as needed for nausea.   Potassium Chloride ER 20 MEQ Tbcr Take 20 mEq by mouth daily. 1 tab daily by mouth   senna-docusate 8.6-50 MG tablet Commonly known as: Senokot-S Take 2 tablets by mouth at bedtime.       Allergies  Allergen Reactions   Penicillins Other (See Comments)    Unknown reaction    Follow-up Information     Hasanaj, Myra Gianotti, MD. Schedule an appointment as soon as possible for a visit in 10 day(s).   Specialty: Internal Medicine Contact information: [address] [place] [address] [phone]                The results of significant diagnostics from this hospitalization (including  imaging, microbiology, ancillary and laboratory) are listed below for reference.    Significant Diagnostic Studies: CT ABDOMEN PELVIS WO CONTRAST  Result Date: 06/24/2021 CLINICAL DATA:  Acute abdominal pain and lactic acidosis EXAM: CT ABDOMEN AND PELVIS WITHOUT CONTRAST TECHNIQUE: Multidetector CT imaging of the abdomen and pelvis was performed following the standard protocol without IV contrast. COMPARISON:  02/11/2018 FINDINGS: Lower chest: Calcified granulomas are noted in the right lower lobe. Mild bibasilar atelectasis is seen without sizable effusion or focal parenchymal nodule. Hepatobiliary: Gallbladder has been surgically removed. Changes of pneumobilia are noted stable in appearance from the prior exam. The previously seen gallbladder fossa fluid collection has resolved interval. Pancreas: Unremarkable. No pancreatic ductal dilatation or surrounding inflammatory changes. Spleen: Normal in size without focal abnormality. Adrenals/Urinary Tract: Adrenal glands are within normal limits. Kidneys are well visualized bilaterally with staghorn calculus in the right upper pole which measures at least 2.2 cm in greatest dimension. No obstructive changes are seen. Right ureter is within normal limits. Left kidney also demonstrates nonobstructing calculi measuring 12 mm in the midportion of the kidney and 9 mm in the lower pole. The ureter is within normal limits. No obstructive changes are seen. Small Peri ureteral diverticulum is noted on  the left. Bladder is otherwise within normal limits. Bilateral renal cysts are noted stable in appearance from the prior CT examination. Stomach/Bowel: No obstructive or inflammatory changes of the colon are noted. Scattered fecal material is seen throughout the colon. The appendix is not well visualized consistent with the prior surgical history. Small bowel and stomach are within normal limits. Vascular/Lymphatic: Atherosclerotic calcifications of the abdominal aorta are  noted. No significant lymphadenopathy is seen. Reproductive: Prostate is prominent with scattered calcifications. Other: No abdominal wall hernia or abnormality. No abdominopelvic ascites. Musculoskeletal: Degenerative changes are noted in the lumbar spine. Bone island is seen within the left iliac wing. IMPRESSION: Bilateral renal cystic change and nonobstructing renal calculi worse on the right than the left. Changes consistent with prior cholecystectomy and evidence of pneumobilia stable from the prior exam. Status post appendectomy. Changes of prior granulomatous disease. Mild bibasilar atelectasis is noted as well. Electronically Signed   By: Mark  Lukens M.D.   On: 06/24/2021 20:53   DG Chest 1 View  Result Date: 06/24/2021 CLINICAL DATA:  Altered mental status. EXAM: CHEST  1 VIEW COMPARISON:  Chest x-ray 02/10/2021. FINDINGS: The aorta is tortuous and the heart is enlarged, unchanged. Lung volumes are low. There is strandy atelectasis in the lung bases. There is no lung consolidation, pleural effusion or pneumothorax. No acute fractures are seen. There are surgical clips in the right abdomen. IMPRESSION: 1. Low lung volumes with bibasilar atelectasis. Electronically Signed   By: Amy  Guttmann M.D.   On: 06/24/2021 15:11   CT Head Wo Contrast  Result Date: 07/16/2021 CLINICAL DATA:  Mental status change, unknown cause EXAM: CT HEAD WITHOUT CONTRAST TECHNIQUE: Contiguous axial images were obtained from the base of the skull through the vertex without intravenous contrast. COMPARISON:  06/24/2021 FINDINGS: Brain: There is no acute intracranial hemorrhage, mass effect, or edema. No new loss of gray-white differentiation. There is no extra-axial fluid collection. Ventricles and sulci are stable in size and configuration. Patchy and confluent hypoattenuation in the supratentorial white matter probably reflects stable chronic microvascular ischemic changes. Small chronic left occipital infarct. Vascular:  There is atherosclerotic calcification at the skull base. Skull: Calvarium is unremarkable. Sinuses/Orbits: No acute finding. Other: None. IMPRESSION: No acute intracranial abnormality. Electronically Signed   By: Praneil  Patel M.D.   On: 07/16/2021 15:31   CT Head Wo Contrast  Result Date: 06/24/2021 CLINICAL DATA:  Altered mental status. EXAM: CT HEAD WITHOUT CONTRAST TECHNIQUE: Contiguous axial images were obtained from the base of the skull through the vertex without intravenous contrast. COMPARISON:  CT head dated Feb 10, 2021. FINDINGS: Brain: No evidence of acute infarction, hemorrhage, hydrocephalus, extra-axial collection or mass lesion/mass effect. Stable atrophy and chronic microvascular ischemic changes. Vascular: Atherosclerotic vascular calcification of the carotid siphons. No hyperdense vessel. Skull: Normal. Negative for fracture or focal lesion. Sinuses/Orbits: No acute finding. Other: None. IMPRESSION: 1. No acute intracranial abnormality. 2. Stable atrophy and chronic microvascular ischemic changes. Electronically Signed   By: William T Derry M.D.   On: 06/24/2021 15:12   CT ABDOMEN PELVIS W CONTRAST  Result Date: 07/16/2021 CLINICAL DATA:  Nonlocalized acute abdominal pain. becoming unresponsive while eating lunch today. EXAM: CT ABDOMEN AND PELVIS WITH CONTRAST TECHNIQUE: Multidetector CT imaging of the abdomen and pelvis was performed using the standard protocol following bolus administration of intravenous contrast. CONTRAST:  [id number] OMNIPAQUE IOHEXOL 300 MG/ML  SOLN COMPARISON:  CT abdomen pelvis 06/24/2021 FINDINGS: Lower chest: Bibasilar atelectasis. Persistent marked noncalcified plaque of the descending  thoracic aorta with more than 40% occlusion of the visualized thoracic aorta lumen. Hepatobiliary: No focal liver abnormality. Status post cholecystectomy. No biliary dilatation. Persistent pneumobilia. Pancreas: No focal lesion. Normal pancreatic contour. No surrounding  inflammatory changes. No main pancreatic ductal dilatation. Spleen: Normal in size without focal abnormality. Adrenals/Urinary Tract: No adrenal nodule bilaterally. Bilateral kidneys enhance symmetrically. There is an at least 1.7 cm persistent calcified stone within the right kidney. There is a 0.9 cm calcified stone within the left kidney. Several simple renal cyst with query of a couple of renal lesions demonstrating a density higher than 20 Hounsfield units (3:39, 40). No hydronephrosis. No hydroureter. The urinary bladder is unremarkable. Couple of urinary bladder diverticula are noted. Stomach/Bowel: Stomach is within normal limits. No evidence of bowel wall thickening or dilatation. Appendix appears normal. Vascular/Lymphatic: No abdominal aorta or iliac aneurysm. Moderate severe atherosclerotic plaque of the aorta and its branches. No abdominal, pelvic, or inguinal lymphadenopathy. Reproductive: The prostate is enlarged measuring up to 6 cm. Other: No intraperitoneal free fluid. No intraperitoneal free gas. No organized fluid collection. Musculoskeletal: No abdominal wall hernia or abnormality. Densely sclerotic lesion is again noted within the left iliac bone and likely represents a bone island. No suspicious lytic or blastic osseous lesions. No acute displaced fracture. Multilevel degenerative changes of the spine. IMPRESSION: 1. Nonobstructive 1.7 cm right and 0.9 cm left nephrolithiasis. 2. Bilateral simple renal cysts with a couple of indeterminate renal lesions. Findings possibly due to artifact in the setting of motion. Recommend MRI renal protocol further evaluation. When the patient is clinically stable and able to follow directions and hold their breath (preferably as an outpatient) further evaluation with dedicated abdominal MRI should be considered. 3. Prostatomegaly with mass effect on the posterior wall of urinary bladder. 4. Aortic Atherosclerosis (ICD10-I70.0) with persistent marked  noncalcified plaque of the descending thoracic aorta with more than 40% occlusion of the visualized lumen. Electronically Signed   By: Tish Frederickson M.D.   On: 07/16/2021 17:16   DG Chest Port 1 View  Result Date: 07/16/2021 CLINICAL DATA:  Unresponsive. EXAM: PORTABLE CHEST 1 VIEW COMPARISON:  Chest x-ray dated June 24, 2021. FINDINGS: Stable cardiomediastinal silhouette with mild cardiomegaly. New diffuse mild interstitial thickening. No focal consolidation, pleural effusion, or pneumothorax. No acute osseous abnormality. IMPRESSION: 1. New mild interstitial pulmonary edema. Electronically Signed   By: Obie Dredge M.D.   On: 07/16/2021 15:14    Microbiology: Recent Results (from the past 240 hour(s))  Resp Panel by RT-PCR (Flu A&B, Covid) Nasopharyngeal Swab     Status: None   Collection Time: 07/16/21  1:53 PM   Specimen: Nasopharyngeal Swab; Nasopharyngeal(NP) swabs in vial transport medium  Result Value Ref Range Status   SARS Coronavirus 2 by RT PCR NEGATIVE NEGATIVE Final    Comment: (NOTE) SARS-CoV-2 target nucleic acids are NOT DETECTED.  The SARS-CoV-2 RNA is generally detectable in upper respiratory specimens during the acute phase of infection. The lowest concentration of SARS-CoV-2 viral copies this assay can detect is 138 copies/mL. A negative result does not preclude SARS-Cov-2 infection and should not be used as the sole basis for treatment or other patient management decisions. A negative result may occur with  improper specimen collection/handling, submission of specimen other than nasopharyngeal swab, presence of viral mutation(s) within the areas targeted by this assay, and inadequate number of viral copies(<138 copies/mL). A negative result must be combined with clinical observations, patient history, and epidemiological information. The expected result is Negative.  Fact Sheet for Patients:  BloggerCourse.com  Fact Sheet for  Healthcare Providers:  SeriousBroker.it  This test is no t yet approved or cleared by the [place] FDA and  has been authorized for detection and/or diagnosis of SARS-CoV-2 by FDA under an Emergency Use Authorization (EUA). This EUA will remain  in effect (meaning this test can be used) for the duration of the COVID-19 declaration under Section 564(b)(1) of the Act, 21 U.S.C.section 360bbb-3(b)(1), unless the authorization is terminated  or revoked sooner.       Influenza A by PCR NEGATIVE NEGATIVE Final   Influenza B by PCR NEGATIVE NEGATIVE Final    Comment: (NOTE) The Xpert Xpress SARS-CoV-2/FLU/RSV plus assay is intended as an aid in the diagnosis of influenza from Nasopharyngeal swab specimens and should not be used as a sole basis for treatment. Nasal washings and aspirates are unacceptable for Xpert Xpress SARS-CoV-2/FLU/RSV testing.  Fact Sheet for Patients: BloggerCourse.com  Fact Sheet for Healthcare Providers: SeriousBroker.it  This test is not yet approved or cleared by the [place] FDA and has been authorized for detection and/or diagnosis of SARS-CoV-2 by FDA under an Emergency Use Authorization (EUA). This EUA will remain in effect (meaning this test can be used) for the duration of the COVID-19 declaration under Section 564(b)(1) of the Act, 21 U.S.C. section 360bbb-3(b)(1), unless the authorization is terminated or revoked.  Performed at [facility], [address] [place], [place] [address]   Blood Culture (routine x 2)     Status: None (Preliminary result)   Collection Time: 07/16/21  2:27 PM   Specimen: BLOOD LEFT HAND  Result Value Ref Range Status   Specimen Description BLOOD LEFT HAND  Final   Special Requests   Final    BOTTLES DRAWN AEROBIC AND ANAEROBIC Blood Culture results may not be optimal due to an inadequate volume of blood received in culture bottles    Culture   Final    NO GROWTH 2 DAYS Performed at [facility], [address] [place], [place] [address]    Report Status PENDING  Incomplete  Blood Culture (routine x 2)     Status: None (Preliminary result)   Collection Time: 07/16/21  2:27 PM   Specimen: BLOOD RIGHT HAND  Result Value Ref Range Status   Specimen Description BLOOD RIGHT HAND  Final   Special Requests   Final    BOTTLES DRAWN AEROBIC AND ANAEROBIC Blood Culture results may not be optimal due to an inadequate volume of blood received in culture bottles   Culture   Final    NO GROWTH 2 DAYS Performed at [facility], [address] [place], [place] [address]    Report Status PENDING  Incomplete  Urine Culture     Status: Abnormal   Collection Time: 07/16/21  5:52 PM   Specimen: In/Out Cath Urine  Result Value Ref Range Status   Specimen Description   Final    IN/OUT CATH URINE Performed at [facility], [address], [place], [place] [address]    Special Requests   Final    NONE Performed at [facility], [address], [place], [place] [address]    Culture (A)  Final    >=100,000 COLONIES/mL MULTIPLE SPECIES PRESENT, SUGGEST RECOLLECTION   Report Status 07/17/2021 FINAL  Final     Labs: Basic Metabolic Panel: Recent Labs  Lab 07/16/21 1516 07/16/21 1951 07/17/21 0449 07/18/21 0513  NA 142  --  145  --   K  4.5  --  4.2  --   CL 108  --  114*  --   CO2 22  --  23  --   GLUCOSE 118*  --  91  --   BUN 18  --  16  --   CREATININE 1.68* 1.42* 1.33* 1.34*  CALCIUM 9.1  --  8.7*  --   MG  --   --  1.9  --   PHOS  --   --  2.6  --    Liver Function Tests: Recent Labs  Lab 07/16/21 1516 07/17/21 0449  AST 20 16  ALT 11 10  ALKPHOS 85 68  BILITOT 0.8 0.8  PROT 7.6 6.5  ALBUMIN 3.8 3.2*   CBC: Recent Labs  Lab 07/16/21 1423 07/16/21 1951 07/17/21 0449  WBC 9.9 6.2 5.4  NEUTROABS 3.8  --   --   HGB 17.0 15.1 14.3  HCT 51.5 46.8 43.5  MCV 91.3 92.1 92.9  PLT 216 151 150     CBG: Recent Labs  Lab 07/16/21 1436  GLUCAP 96    Signed:  Vassie Loll MD.  [facility] 07/18/2021, 3:03 PM

## 2021-07-18 NOTE — Progress Notes (Signed)
Patient discharged back to SNF, report called, patient transported via EMS in stable condition.

## 2021-07-21 LAB — CULTURE, BLOOD (ROUTINE X 2)
Culture: NO GROWTH
Culture: NO GROWTH

## 2021-10-05 ENCOUNTER — Ambulatory Visit: Payer: Medicare Other | Admitting: Podiatry

## 2021-10-09 ENCOUNTER — Ambulatory Visit (INDEPENDENT_AMBULATORY_CARE_PROVIDER_SITE_OTHER): Payer: Medicare Other | Admitting: Podiatry

## 2021-10-09 ENCOUNTER — Encounter: Payer: Self-pay | Admitting: Podiatry

## 2021-10-09 ENCOUNTER — Other Ambulatory Visit: Payer: Self-pay

## 2021-10-09 DIAGNOSIS — D696 Thrombocytopenia, unspecified: Secondary | ICD-10-CM

## 2021-10-09 DIAGNOSIS — M79674 Pain in right toe(s): Secondary | ICD-10-CM

## 2021-10-09 DIAGNOSIS — N289 Disorder of kidney and ureter, unspecified: Secondary | ICD-10-CM | POA: Diagnosis not present

## 2021-10-09 DIAGNOSIS — N1832 Chronic kidney disease, stage 3b: Secondary | ICD-10-CM

## 2021-10-09 DIAGNOSIS — B351 Tinea unguium: Secondary | ICD-10-CM

## 2021-10-09 DIAGNOSIS — M79675 Pain in left toe(s): Secondary | ICD-10-CM

## 2021-10-09 DIAGNOSIS — N179 Acute kidney failure, unspecified: Secondary | ICD-10-CM | POA: Diagnosis not present

## 2021-10-09 DIAGNOSIS — N189 Chronic kidney disease, unspecified: Secondary | ICD-10-CM

## 2021-10-09 NOTE — Progress Notes (Signed)
nai 

## 2021-10-09 NOTE — Progress Notes (Signed)
This patient returns to my office for at risk foot care.  This patient requires this care by a professional since this patient will be at risk due to having thrombocytopenia and CKD. This patient is unable to cut nails himself since the patient cannot reach his nails.These nails are painful walking and wearing shoes.  This patient presents for at risk foot care today. He presents to the office with male caegiver from nursing home in [place].  General Appearance  Alert, conversant and in no acute stress.  Vascular  Dorsalis pedis and posterior tibial  pulses are absent  bilaterally.  Capillary return is within normal limits  bilaterally. Cold feet bilaterally. Absent digital hair  B/l.  Neurologic  Senn-Weinstein monofilament wire test within normal limits  bilaterally. Muscle power within normal limits bilaterally.  Nails Thick disfigured discolored nails with subungual debris  from hallux to fifth toes bilaterally. No evidence of bacterial infection or drainage bilaterally.  Orthopedic  No limitations of motion  feet .  No crepitus or effusions noted.  No bony pathology or digital deformities noted.  Skin  normotropic skin with no porokeratosis noted bilaterally.  No signs of infections or ulcers noted.     Onychomycosis  Pain in right toes  Pain in left toes  Consent was obtained for treatment procedures.   Mechanical debridement of nails 1-5  bilaterally performed with a nail nipper.  Filed with dremel without incident.   Patient would benefit from vascular study.  Not ordered since I do not know his power of attorney.   Return office visit   prn                  Told patient to return for periodic foot care and evaluation due to potential at risk complications.   Helane Gunther DPM

## 2021-11-19 ENCOUNTER — Non-Acute Institutional Stay: Payer: Medicare Other | Admitting: Nurse Practitioner

## 2021-11-19 ENCOUNTER — Encounter: Payer: Self-pay | Admitting: Nurse Practitioner

## 2021-11-19 VITALS — BP 122/64 | HR 88 | Temp 97.9°F | Resp 18 | Wt 114.4 lb

## 2021-11-19 DIAGNOSIS — R5381 Other malaise: Secondary | ICD-10-CM

## 2021-11-19 DIAGNOSIS — Z515 Encounter for palliative care: Secondary | ICD-10-CM

## 2021-11-19 DIAGNOSIS — R630 Anorexia: Secondary | ICD-10-CM

## 2021-11-19 DIAGNOSIS — F0390 Unspecified dementia without behavioral disturbance: Secondary | ICD-10-CM

## 2021-11-19 NOTE — Progress Notes (Signed)
[facility] Palliative Care Consult Note Telephone: [phone]  Fax: [phone]   Date of encounter: 11/19/21 9:13 PM PATIENT NAME: Robert Hurst [place] [address]   [phone] (home)  DOB: [birth year] MRN: [id number] PRIMARY CARE PROVIDER:    [facility] [place]  RESPONSIBLE PARTY:    Contact Information     Name Relation Home Work Mobile   [name] Daughter [phone]     [name], [name] [phone]  [phone]   [name] Daughter   [phone]   [place] Other   [phone]      I met face to face with patient in facility. Palliative Care was asked to follow this patient by consultation request of  [facility] facility to address advance care planning and complex medical decision making. This is the initial visit.                          ASSESSMENT AND PLAN / RECOMMENDATIONS:  Symptom Management/Plan: 1. Advance Care Planning;  DNR  2. Anorexia secondary to Alzheimer's dementia, continue to monitor weights; encourage to eat as he does feed himself, regular mechanical soft diet with thin liquids.  08/02/2021 weight 121.2 lbs 09/06/2021 weight 120.6 lbs 09/27/2021 weight 122 lbs 11/02/2021 weight 114.4 lbs BMI 22.3  3. Debility secondary to Alzheimer's dementia, continue to work with OT/PT will further discuss with family goc, Will have hospice physicians review for eligibility. Fall precautions  4. Goals of Care: Goals include to maximize quality of life and symptom management. Our advance care planning conversation included a discussion about:    The value and importance of advance care planning  Exploration of personal, cultural or spiritual beliefs that might influence medical decisions  Exploration of goals of care in the event of a sudden injury or illness  Identification and preparation of a healthcare agent  Review and updating or creation of an advance directive document.  5.  Palliative care encounter; Palliative care encounter; Palliative medicine team will continue to support patient, patient's family, and medical team. Visit consisted of counseling and education dealing with the complex and emotionally intense issues of symptom management and palliative care in the setting of serious and potentially life-threatening illness  Follow up Palliative Care Visit: Palliative care will continue to follow for complex medical decision making, advance care planning, and clarification of goals. Return 4 weeks or prn.  I spent 66 minutes providing this consultation. More than 50% of the time in this consultation was spent in counseling and care coordination. PPS: 40%  Chief Complaint: Initial palliative consult for complex medical decision making  HISTORY OF PRESENT ILLNESS:  Robert Hurst is a 88 y.o. year old male  with multiple medical problems including Alzheimer's dementia, respiratory failure, anemia, dysphagia, hypothyroidism, DM, CKD, h/o DVT. Robert Hurst resides at [facility] facility. Robert Hurst requires assistance from staff for transfers, mobility, adl's including bathing, dressing, incontinence. Robert Hurst is able to feed himself finger foods. Weight loss. Staff endorses no other changes. At present, Robert Hurst is working in OT, eating his sandwich and drinking a carton of milk, he was able to follow simple commands. Robert Hurst was not verbal, cooperative with assessment. Emotional support provided. Discussed at length Robert. Citro improving with OT with limited. Will further discuss goc with family, no meaningful discussion due to cognitive impairment. Will continue to monitor for now. Updated staff.   History obtained from review of EMR, discussion with facility staff with Robert Hurst.  I reviewed available labs, medications, imaging, studies and related documents from the EMR.  Records reviewed and summarized above.   ROS 10 point system reviewed with staff as Robert.  Hurst is cognitively impaired all negative except HPI  Physical Exam: Constitutional: NAD General: frail appearing, thin, cognitively impaired male EYES: lids intact ENMT: oral mucous membranes moist CV: S1S2, RRR Pulmonary: LCTA, no increased work of breathing, no cough, room air Abdomen: soft and non tender MSK: w/c dependent Skin: warm and dry Neuro:  + generalized weakness,  + cognitive impairment Psych: non-anxious affect, A and Oriented to self CURRENT PROBLEM LIST:  Patient Active Problem List   Diagnosis Date Noted   Pain due to onychomycosis of toenails of both feet 10/09/2021   Elevated lactic acid level    Episode of unresponsiveness 07/16/2021   Severe sepsis ([place]) 02/10/2021   COVID-19    Advanced care planning/counseling discussion    Palliative care by specialist    Sepsis due to undetermined organism ([place]) 05/22/2020   Hypotension due to hypovolemia    Thrombocytopenia ([place]) 05/03/2020   Sepsis due to Enterococcus ([place]) 05/03/2020   Acute on chronic renal insufficiency    UTI (urinary tract infection) 04/30/2020   CKD (chronic kidney disease), stage III ([place]) 02/12/2020   Hypothyroidism 02/12/2020   History of DVT (deep vein thrombosis)    History of COVID-19    Acute cystitis without hematuria    Goals of care, counseling/discussion    Palliative care encounter    Acute metabolic encephalopathy 62/83/1517   Syncope and collapse 08/11/2019   Syncope    Dementia without behavioral disturbance ([place]) 08/08/2019   Acute renal failure superimposed on stage 3b chronic kidney disease ([place]) 08/08/2019   Lactic acidosis 08/08/2019   Incontinence 08/08/2019   PAST MEDICAL HISTORY:  Active Ambulatory Problems    Diagnosis Date Noted   Dementia without behavioral disturbance ([place]) 08/08/2019   Acute renal failure superimposed on stage 3b chronic kidney disease ([place]) 08/08/2019   Lactic acidosis 08/08/2019   Incontinence 61/60/7371   Acute metabolic  encephalopathy 03/18/9484   Syncope and collapse 08/11/2019   Syncope    Acute cystitis without hematuria    Goals of care, counseling/discussion    Palliative care encounter    CKD (chronic kidney disease), stage III ([place]) 02/12/2020   Hypothyroidism 02/12/2020   History of DVT (deep vein thrombosis)    History of COVID-19    UTI (urinary tract infection) 04/30/2020   Thrombocytopenia ([place]) 05/03/2020   Acute on chronic renal insufficiency    Sepsis due to Enterococcus ([place]) 05/03/2020   Hypotension due to hypovolemia    Sepsis due to undetermined organism ([place]) 05/22/2020   COVID-19    Advanced care planning/counseling discussion    Palliative care by specialist    Severe sepsis ([place]) 02/10/2021   Episode of unresponsiveness 07/16/2021   Elevated lactic acid level    Pain due to onychomycosis of toenails of both feet 10/09/2021   Resolved Ambulatory Problems    Diagnosis Date Noted   No Resolved Ambulatory Problems   Past Medical History:  Diagnosis Date   Alzheimer disease ([place])    Calculus of bile duct (any) with acute cholecystitis    Cognitive communication deficit    Dementia ([place])    DVT (deep venous thrombosis) ([place])    Dysphagia    Malnourished ([place])    Metabolic encephalopathy    Protein calorie malnutrition ([place])    Renal disorder    Thyroid disease  SOCIAL HX:  Social History   Tobacco Use   Smoking status: Never   Smokeless tobacco: Never  Substance Use Topics   Alcohol use: Not Currently   FAMILY HX: History reviewed. No pertinent family history.    ALLERGIES:  Allergies  Allergen Reactions   Penicillins Other (See Comments)    Unknown reaction     PERTINENT MEDICATIONS:  Outpatient Encounter Medications as of 11/19/2021  Medication Sig   acetaminophen (TYLENOL) 325 MG tablet Take 2 tablets (650 mg total) by mouth every 6 (six) hours as needed for mild pain, fever or headache (fever >/= 101).   cyanocobalamin (,VITAMIN B-12,) 1000 MCG/ML  injection Inject 1 mL (1,000 mcg total) into the muscle every 30 (thirty) days.   feeding supplement (ENSURE ENLIVE / ENSURE PLUS) LIQD Take 237 mLs by mouth 3 (three) times daily.   levothyroxine (SYNTHROID) 25 MCG tablet Take 1 tablet (25 mcg total) by mouth daily before breakfast.   memantine (NAMENDA) 10 MG tablet Take 10 mg by mouth 2 (two) times daily.   ondansetron (ZOFRAN) 4 MG tablet Take 1 tablet (4 mg total) by mouth every 6 (six) hours as needed for nausea.   Potassium Chloride ER 20 MEQ TBCR Take 20 mEq by mouth daily. 1 tab daily by mouth   senna-docusate (SENOKOT-S) 8.6-50 MG tablet Take 2 tablets by mouth at bedtime.   No facility-administered encounter medications on file as of 11/19/2021.   Thank you for the opportunity to participate in the care of Robert. Dettmann.  The palliative care team will continue to follow. Please call our office at [phone] if we can be of additional assistance.   Chayce Rullo Z Lynette Topete, NP ,

## 2021-11-20 ENCOUNTER — Other Ambulatory Visit: Payer: Self-pay

## 2021-12-28 ENCOUNTER — Encounter: Payer: Self-pay | Admitting: Nurse Practitioner

## 2021-12-28 ENCOUNTER — Non-Acute Institutional Stay: Payer: Medicare Other | Admitting: Nurse Practitioner

## 2021-12-28 DIAGNOSIS — R630 Anorexia: Secondary | ICD-10-CM

## 2021-12-28 DIAGNOSIS — Z515 Encounter for palliative care: Secondary | ICD-10-CM

## 2021-12-28 DIAGNOSIS — R5381 Other malaise: Secondary | ICD-10-CM

## 2021-12-28 DIAGNOSIS — F0390 Unspecified dementia without behavioral disturbance: Secondary | ICD-10-CM

## 2021-12-28 NOTE — Progress Notes (Signed)
? ? ?Manufacturing engineer ?Community Palliative Care Consult Note ?Telephone: [phone]  ?Fax: [phone]  ? ? ?Date of encounter: 12/28/21 ?5:22 PM ?PATIENT NAME: Robert [place]st ?[place] [address]   ?[phone] (home)  ?DOB: [birth year] ?MRN: [id number] ?PRIMARY CARE PROVIDER:   [facility] ?[address] [place],  ?[place] ?[place] [address] ?[phone] ?RESPONSIBLE PARTY:    ?Contact Information   ? ? Name Relation Home Work Mobile  ? [name] Daughter [phone]    ? Robert Hurst Son [phone]  [phone]  ? Robert Hurst Daughter   [phone]  ? Robert Hurst Other   [phone]  ? ?  ? ?I met face to face with patient in facility. Palliative Care was asked to follow this patient by consultation request of  Robert Hurst to address advance care planning and complex medical decision making. This is a follow up visit.                                  ?ASSESSMENT AND PLAN / RECOMMENDATIONS:  ?Symptom Management/Plan: ?1. Advance Care Planning;  DNR ?  ?2. Anorexia secondary to Alzheimer's dementia, continue to monitor weights; encourage to eat as he does feed himself, regular mechanical soft diet with thin liquids.  ?08/02/2021 weight 121.2 lbs ?09/06/2021 weight 120.6 lbs ?09/27/2021 weight 122 lbs ?11/02/2021 weight 114.4 lbs ?Weight for this month pending ?  ?3. Debility secondary to Alzheimer's dementia, progressive decline, restorative exercises; Fall precautions ?  ?4. Goals of Care: Goals include to maximize quality of life and symptom management. Our advance care planning conversation included a discussion about:    ?The value and importance of advance care planning  ?Exploration of personal, cultural or spiritual beliefs that might influence medical decisions  ?Exploration of goals of care in the event of a sudden injury or illness  ?Identification and preparation of a healthcare agent  ?Review and updating or creation of  an advance directive document. ?  ?5. Palliative care encounter; Palliative care encounter; Palliative medicine team will continue to support patient, patient's family, and medical team. Visit consisted of counseling and education dealing with the complex and emotionally intense issues of symptom management and palliative care in the setting of serious and potentially life-threatening illness ? ?Follow up Palliative Care Visit: Palliative care will continue to follow for complex medical decision making, advance care planning, and clarification of goals. Return 8 weeks or prn. ? ?I spent 38 minutes providing this consultation. More than 50% of the time in this consultation was spent in counseling and care coordination. ?PPS: 40% ? ?Chief Complaint: Follow up palliative consult for complex medical decision making  ? ?HISTORY OF PRESENT ILLNESS:  Robert Hurst is a 88 y.o. year old male  with multiple medical problems including Alzheimer's dementia, respiratory failure, anemia, dysphagia, hypothyroidism, DM, CKD, h/o DVT. Robert Hurst resides at Robert [facility] at [facility]. Robert Hurst requires assistance from staff for transfers, mobility, adl's including bathing, dressing, incontinence. Robert Hurst is able to feed himself finger foods. Staff endorses improved appetite, weight pending.  No recent falls, wounds, infections, hospitalizations per staff. At present, Robert Hurst is sitting in the w/c in the hall. Robert Hurst did say a few words, very soft speech, not answers to questions. Robert Hurst does make eye contact, cooperative with assessment. Limited interaction as cognitively impaired. Emotional support provided. Attempted to contact Robert Hurst, Robert Hurst daughter  for update, no answer, will continue to try to contact.Updated staff.  ? ?History obtained from review of EMR, discussion with primary team, and interview with family, facility staff/caregiver and/or Robert Hurst.  ?I reviewed available labs, medications,  imaging, studies and related documents from the EMR.  Records reviewed and summarized above.  ? ?ROS ?10 point system reviewed all negative except HPI per facility staff as Robert Hurst is cognitively impaired ? ?Physical Exam: ?Constitutional: NAD ?General: frail appearing, thin, cognitively impaired male ?EYES: lids intact ?ENMT: oral mucous membranes moist ?CV: S1S2, RRR ?Pulmonary: LCTA, no increased work of breathing, no cough, room air ?Abdomen: soft and non tender ?MSK: w/c dependent ?Skin: warm and dry ?Neuro:  + generalized weakness,  + cognitive impairment ?Psych: non-anxious affect, Alert, confused ?Thank you for the opportunity to participate in the care of Robert Hurst.  The palliative care team will continue to follow. Please call our office at [phone] if we can be of additional assistance.  ? ?Krisy Dix Z Aydden Cumpian, NP   ?

## 2022-01-11 ENCOUNTER — Encounter: Payer: Self-pay | Admitting: Nurse Practitioner

## 2022-01-11 ENCOUNTER — Non-Acute Institutional Stay: Payer: Medicare Other | Admitting: Nurse Practitioner

## 2022-01-11 DIAGNOSIS — R5381 Other malaise: Secondary | ICD-10-CM

## 2022-01-11 DIAGNOSIS — R630 Anorexia: Secondary | ICD-10-CM

## 2022-01-11 DIAGNOSIS — Z515 Encounter for palliative care: Secondary | ICD-10-CM

## 2022-01-11 DIAGNOSIS — F0390 Unspecified dementia without behavioral disturbance: Secondary | ICD-10-CM

## 2022-01-11 NOTE — Progress Notes (Addendum)
? ? ?Manufacturing engineer ?Community Palliative Care Consult Note ?Telephone: [phone]  ?Fax: [phone]  ? ? ?Date of encounter: 01/11/22 ?5:35 PM ?PATIENT NAME: Robert [place]st ?[place] [address]   ?[phone] (home)  ?DOB: [birth year] ?MRN: [id number] ?PRIMARY CARE PROVIDER:    ?[place] [place]  ? ?RESPONSIBLE PARTY:    ?Contact Information   ? ? Name Relation Home Work Mobile  ? Robert Hurst Daughter [phone]    ? Robert Hurst Son [phone]  [phone]  ? Robert Hurst Daughter   [phone]  ? Robert Hurst Other   [phone]  ? ?  ? ?I met face to face with patient in facility. Palliative Care was asked to follow this patient by consultation request of  [facility] to address advance care planning and complex medical decision making. This is a follow up visit.                                  ?ASSESSMENT AND PLAN / RECOMMENDATIONS: ?Symptom Management/Plan: ?1. Advance Care Planning;  DNR ?  ?2. Anorexia secondary to Alzheimer's dementia, continue to monitor weights; encourage to eat as he does feed himself, regular mechanical soft diet with thin liquids.  ?08/02/2021 weight 121.2 lbs ?09/06/2021 weight 120.6 lbs ?09/27/2021 weight 122 lbs ?11/02/2021 weight 114.4 lbs ?11/23/2021 weight 119.4 lbs ?5 lbs weight gain/4 weeks ?  ?3. Debility secondary to Alzheimer's dementia, completed OT,  will further discuss with family goc, Fall precautions. Message left ?  ?4. Palliative care encounter; Palliative care encounter; Palliative medicine team will continue to support patient, patient's family, and medical team. Visit consisted of counseling and education dealing with the complex and emotionally intense issues of symptom management and palliative care in the setting of serious and potentially life-threatening illness ? ?Follow up Palliative Care Visit: Palliative care will continue to follow for complex medical decision making, advance care planning, and  clarification of goals. Return 4 weeks or prn. ? ?I spent 48 minutes providing this consultation. More than 50% of the time in this consultation was spent in counseling and care coordination. ? ?PPS: 40% ? ?Chief Complaint: Follow up palliative consult for complex medical decision making ? ?HISTORY OF PRESENT ILLNESS:  Robert Hurst is a 88 y.o. year old male  with multiple medical problems including Alzheimer's dementia, respiratory failure, anemia, dysphagia, hypothyroidism, DM, CKD, h/o DVT. Robert Hurst resides at [facility] facility. Robert Hurst requires assistance from staff for transfers, mobility, adl's including bathing, dressing, incontinence. Robert Hurst is able to feed himself finger foods. Weight loss. Staff endorses Hurst other changes. At present, Robert Hurst is sitting in the w/c in his room. He is attempting to feed himself, assisted with lunch tray. Robert Hurst was able to feed finger food, was able to feed him the rest of his food. Appetite varies per staff, as he has gained weight this month. We talked about Hurst worsening dysphagia. Talked about feeding himself with staff. Hurst new wounds, falls, hospitalizations, infections per staff. Robert Hurst has completed OT.  Robert Hurst was cooperative with assessment, limited discussion with cognitive impairment. Emotional support provided. Will further discuss goc with family, Hurst meaningful discussion due to cognitive impairment. Will continue to monitor for now. Updated staff.  .  ? ?History obtained from review of EMR, discussion with facility staff and Robert Hurst.  ?I reviewed available labs, medications, imaging, studies and related documents from the EMR.  Records reviewed and  summarized above.  ? ?ROS ?10 point system reviewed with staff, all negative except HPI as Robert Hurst is confused ? ?Physical Exam: ?Constitutional: NAD ?General: frail appearing, thin, confused, debilitated pleasant male ?EYES:  lids intact ?ENMT: oral mucous membranes moist ?CV: S1S2,  RRR ?Pulmonary: LCTA, Hurst increased work of breathing, Hurst cough, room air ?Abdomen:normo-active BS + 4 quadrants, soft and non tender ?MSK: w/c dependent ?Skin: warm and dry ?Neuro:  + generalized weakness,  + cognitive impairment ?Psych: non-anxious affect, A and oriented to self ?Thank you for the opportunity to participate in the care of Robert Hurst.  The palliative care team will continue to follow. Please call our office at [phone] if we can be of additional assistance.  ? ?Traeger Sultana Z Eli Adami, NP  ?  ?

## 2022-04-05 ENCOUNTER — Emergency Department (HOSPITAL_COMMUNITY): Payer: Medicare Other

## 2022-04-05 ENCOUNTER — Inpatient Hospital Stay (HOSPITAL_COMMUNITY)
Admission: EM | Admit: 2022-04-05 | Discharge: 2022-04-09 | DRG: 682 | Disposition: A | Discharge status: Skilled Nursing Facility | Payer: Medicare Other | Source: Skilled Nursing Facility | Attending: Family Medicine | Admitting: Family Medicine

## 2022-04-05 ENCOUNTER — Encounter (HOSPITAL_COMMUNITY): Payer: Self-pay

## 2022-04-05 DIAGNOSIS — R41841 Cognitive communication deficit: Secondary | ICD-10-CM | POA: Diagnosis present

## 2022-04-05 DIAGNOSIS — G9341 Metabolic encephalopathy: Secondary | ICD-10-CM | POA: Diagnosis present

## 2022-04-05 DIAGNOSIS — R001 Bradycardia, unspecified: Secondary | ICD-10-CM | POA: Diagnosis present

## 2022-04-05 DIAGNOSIS — R4182 Altered mental status, unspecified: Secondary | ICD-10-CM | POA: Diagnosis present

## 2022-04-05 DIAGNOSIS — E039 Hypothyroidism, unspecified: Secondary | ICD-10-CM | POA: Diagnosis present

## 2022-04-05 DIAGNOSIS — I129 Hypertensive chronic kidney disease with stage 1 through stage 4 chronic kidney disease, or unspecified chronic kidney disease: Secondary | ICD-10-CM | POA: Diagnosis present

## 2022-04-05 DIAGNOSIS — N1832 Chronic kidney disease, stage 3b: Secondary | ICD-10-CM | POA: Diagnosis present

## 2022-04-05 DIAGNOSIS — Z6820 Body mass index (BMI) 20.0-20.9, adult: Secondary | ICD-10-CM

## 2022-04-05 DIAGNOSIS — E870 Hyperosmolality and hypernatremia: Secondary | ICD-10-CM | POA: Diagnosis present

## 2022-04-05 DIAGNOSIS — E860 Dehydration: Secondary | ICD-10-CM | POA: Diagnosis present

## 2022-04-05 DIAGNOSIS — E871 Hypo-osmolality and hyponatremia: Secondary | ICD-10-CM | POA: Diagnosis present

## 2022-04-05 DIAGNOSIS — Z880 Allergy status to penicillin: Secondary | ICD-10-CM

## 2022-04-05 DIAGNOSIS — D61818 Other pancytopenia: Secondary | ICD-10-CM | POA: Diagnosis present

## 2022-04-05 DIAGNOSIS — Z86718 Personal history of other venous thrombosis and embolism: Secondary | ICD-10-CM

## 2022-04-05 DIAGNOSIS — Z8616 Personal history of COVID-19: Secondary | ICD-10-CM

## 2022-04-05 DIAGNOSIS — Z66 Do not resuscitate: Secondary | ICD-10-CM | POA: Diagnosis not present

## 2022-04-05 DIAGNOSIS — Z79890 Hormone replacement therapy (postmenopausal): Secondary | ICD-10-CM

## 2022-04-05 DIAGNOSIS — N179 Acute kidney failure, unspecified: Secondary | ICD-10-CM | POA: Diagnosis not present

## 2022-04-05 DIAGNOSIS — Z79899 Other long term (current) drug therapy: Secondary | ICD-10-CM

## 2022-04-05 DIAGNOSIS — G309 Alzheimer's disease, unspecified: Secondary | ICD-10-CM | POA: Diagnosis present

## 2022-04-05 DIAGNOSIS — F0280 Dementia in other diseases classified elsewhere without behavioral disturbance: Secondary | ICD-10-CM | POA: Diagnosis present

## 2022-04-05 DIAGNOSIS — R64 Cachexia: Secondary | ICD-10-CM | POA: Diagnosis present

## 2022-04-05 DIAGNOSIS — I959 Hypotension, unspecified: Secondary | ICD-10-CM | POA: Diagnosis present

## 2022-04-05 DIAGNOSIS — E872 Acidosis, unspecified: Secondary | ICD-10-CM | POA: Diagnosis present

## 2022-04-05 LAB — COMPREHENSIVE METABOLIC PANEL
ALT: 11 U/L (ref 0–44)
AST: 19 U/L (ref 15–41)
Albumin: 3.9 g/dL (ref 3.5–5.0)
Alkaline Phosphatase: 86 U/L (ref 38–126)
Anion gap: 11 (ref 5–15)
BUN: 17 mg/dL (ref 8–23)
CO2: 26 mmol/L (ref 22–32)
Calcium: 9.7 mg/dL (ref 8.9–10.3)
Chloride: 109 mmol/L (ref 98–111)
Creatinine, Ser: 1.89 mg/dL — ABNORMAL HIGH (ref 0.61–1.24)
GFR, Estimated: 34 mL/min — ABNORMAL LOW (ref >60)
Glucose, Bld: 113 mg/dL — ABNORMAL HIGH (ref 70–99)
Potassium: 4.7 mmol/L (ref 3.5–5.1)
Sodium: 146 mmol/L — ABNORMAL HIGH (ref 135–145)
Total Bilirubin: 0.8 mg/dL (ref 0.3–1.2)
Total Protein: 7.8 g/dL (ref 6.5–8.1)

## 2022-04-05 LAB — PROTIME-INR
INR: 1 (ref 0.8–1.2)
Prothrombin Time: 13.3 seconds (ref 11.4–15.2)

## 2022-04-05 LAB — URINALYSIS, ROUTINE W REFLEX MICROSCOPIC
Bilirubin Urine: NEGATIVE
Glucose, UA: NEGATIVE mg/dL
Ketones, ur: NEGATIVE mg/dL
Nitrite: NEGATIVE
Protein, ur: 30 mg/dL — AB
Specific Gravity, Urine: 1.01 (ref 1.005–1.030)
pH: 6 (ref 5.0–8.0)

## 2022-04-05 LAB — LACTIC ACID, PLASMA
Lactic Acid, Venous: 4.5 mmol/L (ref 0.5–1.9)
Lactic Acid, Venous: 3.1 mmol/L (ref 0.5–1.9)

## 2022-04-05 LAB — I-STAT CHEM 8, ED
BUN: 19 mg/dL (ref 8–23)
Calcium, Ion: 1.16 mmol/L (ref 1.15–1.40)
Chloride: 108 mmol/L (ref 98–111)
Creatinine, Ser: 1.8 mg/dL — ABNORMAL HIGH (ref 0.61–1.24)
Glucose, Bld: 108 mg/dL — ABNORMAL HIGH (ref 70–99)
HCT: 53 % — ABNORMAL HIGH (ref 39.0–52.0)
Hemoglobin: 18 g/dL — ABNORMAL HIGH (ref 13.0–17.0)
Potassium: 4.7 mmol/L (ref 3.5–5.1)
Sodium: 147 mmol/L — ABNORMAL HIGH (ref 135–145)
TCO2: 27 mmol/L (ref 22–32)

## 2022-04-05 LAB — CBC WITH DIFFERENTIAL/PLATELET
Abs Immature Granulocytes: 0.02 10*3/uL (ref 0.00–0.07)
Basophils Absolute: 0 10*3/uL (ref 0.0–0.1)
Basophils Relative: 0 %
Eosinophils Absolute: 0 10*3/uL (ref 0.0–0.5)
Eosinophils Relative: 1 %
HCT: 50.7 % (ref 39.0–52.0)
Hemoglobin: 16.7 g/dL (ref 13.0–17.0)
Immature Granulocytes: 0 %
Lymphocytes Relative: 19 %
Lymphs Abs: 1.4 10*3/uL (ref 0.7–4.0)
MCH: 29.5 pg (ref 26.0–34.0)
MCHC: 32.9 g/dL (ref 30.0–36.0)
MCV: 89.4 fL (ref 80.0–100.0)
Monocytes Absolute: 0.7 10*3/uL (ref 0.1–1.0)
Monocytes Relative: 10 %
Neutro Abs: 4.9 10*3/uL (ref 1.7–7.7)
Neutrophils Relative %: 70 %
Platelets: 184 10*3/uL (ref 150–400)
RBC: 5.67 MIL/uL (ref 4.22–5.81)
RDW: 15 % (ref 11.5–15.5)
WBC: 7 10*3/uL (ref 4.0–10.5)
nRBC: 0 % (ref 0.0–0.2)

## 2022-04-05 LAB — APTT: aPTT: 27 seconds (ref 24–36)

## 2022-04-05 MED ORDER — LACTATED RINGERS IV BOLUS
1000.0000 mL | Freq: Once | INTRAVENOUS | Status: AC
  Administered 2022-04-05: 1000 mL via INTRAVENOUS

## 2022-04-05 MED ORDER — VANCOMYCIN HCL IN DEXTROSE 1-5 GM/200ML-% IV SOLN
1000.0000 mg | Freq: Once | INTRAVENOUS | Status: AC
  Administered 2022-04-05: 1000 mg via INTRAVENOUS
  Filled 2022-04-05: qty 200

## 2022-04-05 MED ORDER — CEFEPIME HCL 2 G IV SOLR
2.0000 g | Freq: Once | INTRAVENOUS | Status: DC
  Filled 2022-04-05: qty 12.5

## 2022-04-05 MED ORDER — SODIUM CHLORIDE 0.9 % IV SOLN
2.0000 g | INTRAVENOUS | Status: DC
Start: 2022-04-06 — End: 2022-04-10
  Administered 2022-04-06 – 2022-04-08 (×3): 2 g via INTRAVENOUS
  Filled 2022-04-05 (×3): qty 12.5

## 2022-04-05 MED ORDER — LACTATED RINGERS IV SOLN: INTRAVENOUS | Status: DC

## 2022-04-05 NOTE — ED Triage Notes (Signed)
Pt arrived from [facility] via [facility]. Per family pt normally talks and is not talking today. Per family pt was talking on Wed. Pt emesis x 1 this am. Family is worried about pt having UTI

## 2022-04-05 NOTE — ED Provider Notes (Signed)
  Provider Note MRN:  [id number]  Arrival date & time: 04/06/22    ED Course and Medical Decision Making  Assumed care from Dr. Posey Rea at shift change.  Dehydration, altered mental status, coming from skilled nursing facility.  Will need admission.  Awaiting CT imaging.  CT imaging without acute process, will request hospitalist admission.  Procedures  Final Clinical Impressions(s) / ED Diagnoses     ICD-10-CM   1. Dehydration  E86.0     2. Altered mental status, unspecified altered mental status type  R41.82       ED Discharge Orders     None       Discharge Instructions   None     Elmer Sow. Pilar Plate, MD [facility] Emergency Medicine [facility] [email or web address]    Sabas Sous, MD 04/06/22 [phone]

## 2022-04-05 NOTE — Progress Notes (Addendum)
Pharmacy Antibiotic Note  Robert Hurst is a 88 y.o. male admitted on 04/05/2022 with sepsis.  Pharmacy has been consulted for vancomycin and cefepime dosing. Pt presented from [facility] with AMS and emesis x1 this morning. Pharmacy consulted for vanc and cefepime dosing for suspected sepsis. Lactic acid 4.5 on admit. WBC is 7. Pt afebrile. Pt has noted penicillin allergy with unknown reaction history. Spoke with MD who prefers cefepime to Zosyn.  Cr is 1.8 and CrCl is 18 based on most recent weight of ~52 kg.  Plan: Vancomycin 1g load, then dosing per levels given unstable renal function Cefepime 2g every 24 hours Follow clinical progress and deescalate as indicated   Temp (24hrs), Avg:98.3 F (36.8 C), Min:98.3 F (36.8 C), Max:98.3 F (36.8 C)  Recent Labs  Lab 04/05/22 1928 04/05/22 1953  WBC 7.0  --   CREATININE 1.89* 1.80*  LATICACIDVEN 4.5*  --     CrCl cannot be calculated (Unknown ideal weight.).    Allergies  Allergen Reactions   Penicillins Other (See Comments)    Unknown reaction    Antimicrobials this admission: Cefepime 7/14 >>  Vanc 7/14 >>   Dose adjustments this admission: none  Microbiology results: 7/14 BCx: IP 7/14 UCx: IP  7/14 MRSA PCR: IP  Thank you for allowing pharmacy to participate in this patient's care.  Enos Fling, PharmD PGY1 Pharmacy Resident 04/05/2022 9:35 PM Check [email or web address] for unit specific pharmacy number

## 2022-04-06 ENCOUNTER — Other Ambulatory Visit: Payer: Self-pay

## 2022-04-06 DIAGNOSIS — R410 Disorientation, unspecified: Secondary | ICD-10-CM | POA: Diagnosis not present

## 2022-04-06 DIAGNOSIS — N179 Acute kidney failure, unspecified: Secondary | ICD-10-CM

## 2022-04-06 DIAGNOSIS — Z880 Allergy status to penicillin: Secondary | ICD-10-CM | POA: Diagnosis not present

## 2022-04-06 DIAGNOSIS — R4182 Altered mental status, unspecified: Secondary | ICD-10-CM | POA: Diagnosis present

## 2022-04-06 DIAGNOSIS — N1832 Chronic kidney disease, stage 3b: Secondary | ICD-10-CM | POA: Diagnosis present

## 2022-04-06 DIAGNOSIS — E872 Acidosis, unspecified: Secondary | ICD-10-CM

## 2022-04-06 DIAGNOSIS — N189 Chronic kidney disease, unspecified: Secondary | ICD-10-CM | POA: Diagnosis not present

## 2022-04-06 DIAGNOSIS — E860 Dehydration: Principal | ICD-10-CM

## 2022-04-06 DIAGNOSIS — R41841 Cognitive communication deficit: Secondary | ICD-10-CM | POA: Diagnosis present

## 2022-04-06 DIAGNOSIS — F0280 Dementia in other diseases classified elsewhere without behavioral disturbance: Secondary | ICD-10-CM | POA: Diagnosis present

## 2022-04-06 DIAGNOSIS — G9341 Metabolic encephalopathy: Secondary | ICD-10-CM | POA: Diagnosis not present

## 2022-04-06 DIAGNOSIS — Z79899 Other long term (current) drug therapy: Secondary | ICD-10-CM | POA: Diagnosis not present

## 2022-04-06 DIAGNOSIS — E039 Hypothyroidism, unspecified: Secondary | ICD-10-CM

## 2022-04-06 DIAGNOSIS — R001 Bradycardia, unspecified: Secondary | ICD-10-CM | POA: Diagnosis present

## 2022-04-06 DIAGNOSIS — Z66 Do not resuscitate: Secondary | ICD-10-CM | POA: Diagnosis not present

## 2022-04-06 DIAGNOSIS — Z86718 Personal history of other venous thrombosis and embolism: Secondary | ICD-10-CM | POA: Diagnosis not present

## 2022-04-06 DIAGNOSIS — E871 Hypo-osmolality and hyponatremia: Secondary | ICD-10-CM | POA: Diagnosis present

## 2022-04-06 DIAGNOSIS — D61818 Other pancytopenia: Secondary | ICD-10-CM | POA: Diagnosis present

## 2022-04-06 DIAGNOSIS — I129 Hypertensive chronic kidney disease with stage 1 through stage 4 chronic kidney disease, or unspecified chronic kidney disease: Secondary | ICD-10-CM | POA: Diagnosis present

## 2022-04-06 DIAGNOSIS — Z6820 Body mass index (BMI) 20.0-20.9, adult: Secondary | ICD-10-CM | POA: Diagnosis not present

## 2022-04-06 DIAGNOSIS — I959 Hypotension, unspecified: Secondary | ICD-10-CM | POA: Diagnosis present

## 2022-04-06 DIAGNOSIS — R64 Cachexia: Secondary | ICD-10-CM | POA: Diagnosis present

## 2022-04-06 DIAGNOSIS — Z8616 Personal history of COVID-19: Secondary | ICD-10-CM | POA: Diagnosis not present

## 2022-04-06 DIAGNOSIS — G309 Alzheimer's disease, unspecified: Secondary | ICD-10-CM | POA: Diagnosis not present

## 2022-04-06 DIAGNOSIS — Z79890 Hormone replacement therapy (postmenopausal): Secondary | ICD-10-CM | POA: Diagnosis not present

## 2022-04-06 DIAGNOSIS — E870 Hyperosmolality and hypernatremia: Secondary | ICD-10-CM | POA: Diagnosis present

## 2022-04-06 LAB — COMPREHENSIVE METABOLIC PANEL
ALT: 7 U/L (ref 0–44)
AST: 13 U/L — ABNORMAL LOW (ref 15–41)
Albumin: 2.7 g/dL — ABNORMAL LOW (ref 3.5–5.0)
Alkaline Phosphatase: 59 U/L (ref 38–126)
Anion gap: 7 (ref 5–15)
BUN: 14 mg/dL (ref 8–23)
CO2: 24 mmol/L (ref 22–32)
Calcium: 8.7 mg/dL — ABNORMAL LOW (ref 8.9–10.3)
Chloride: 112 mmol/L — ABNORMAL HIGH (ref 98–111)
Creatinine, Ser: 1.53 mg/dL — ABNORMAL HIGH (ref 0.61–1.24)
GFR, Estimated: 43 mL/min — ABNORMAL LOW (ref >60)
Glucose, Bld: 84 mg/dL (ref 70–99)
Potassium: 4.4 mmol/L (ref 3.5–5.1)
Sodium: 143 mmol/L (ref 135–145)
Total Bilirubin: 0.3 mg/dL (ref 0.3–1.2)
Total Protein: 5.5 g/dL — ABNORMAL LOW (ref 6.5–8.1)

## 2022-04-06 LAB — LACTIC ACID, PLASMA
Lactic Acid, Venous: 1.3 mmol/L (ref 0.5–1.9)
Lactic Acid, Venous: 2.1 mmol/L (ref 0.5–1.9)

## 2022-04-06 LAB — PHOSPHORUS: Phosphorus: 2.8 mg/dL (ref 2.5–4.6)

## 2022-04-06 LAB — PROCALCITONIN: Procalcitonin: <0.1 ng/mL

## 2022-04-06 LAB — MAGNESIUM: Magnesium: 1.8 mg/dL (ref 1.7–2.4)

## 2022-04-06 LAB — MRSA NEXT GEN BY PCR, NASAL: MRSA by PCR Next Gen: DETECTED — AB

## 2022-04-06 LAB — APTT: aPTT: 28 seconds (ref 24–36)

## 2022-04-06 MED ORDER — SODIUM CHLORIDE 0.9 % IV SOLN
2.0000 g | Freq: Once | INTRAVENOUS | Status: AC
  Administered 2022-04-06: 2 g via INTRAVENOUS
  Filled 2022-04-06: qty 20

## 2022-04-06 MED ORDER — ORAL CARE MOUTH RINSE
15.0000 mL | OROMUCOSAL | Status: DC
  Administered 2022-04-06 – 2022-04-09 (×10): 15 mL via OROMUCOSAL

## 2022-04-06 MED ORDER — LEVOTHYROXINE SODIUM 25 MCG PO TABS
25.0000 ug | ORAL_TABLET | Freq: Every day | ORAL | Status: DC
Start: 2022-04-06 — End: 2022-04-10
  Administered 2022-04-06 – 2022-04-08 (×3): 25 ug via ORAL
  Filled 2022-04-06 (×4): qty 1

## 2022-04-06 MED ORDER — SODIUM CHLORIDE 0.9 % IV BOLUS
500.0000 mL | Freq: Once | INTRAVENOUS | Status: AC
  Administered 2022-04-06: 500 mL via INTRAVENOUS

## 2022-04-06 MED ORDER — MEMANTINE HCL 10 MG PO TABS
10.0000 mg | ORAL_TABLET | Freq: Two times a day (BID) | ORAL | Status: DC
  Administered 2022-04-06 – 2022-04-08 (×6): 10 mg via ORAL
  Filled 2022-04-06 (×8): qty 1

## 2022-04-06 MED ORDER — ENSURE ENLIVE PO LIQD
237.0000 mL | Freq: Three times a day (TID) | ORAL | Status: DC
  Administered 2022-04-06 – 2022-04-09 (×8): 237 mL via ORAL
  Filled 2022-04-06 (×7): qty 237

## 2022-04-06 MED ORDER — ACETAMINOPHEN 325 MG PO TABS: 650.0000 mg | ORAL_TABLET | Freq: Four times a day (QID) | ORAL | Status: DC | PRN

## 2022-04-06 MED ORDER — CHLORHEXIDINE GLUCONATE CLOTH 2 % EX PADS
6.0000 | MEDICATED_PAD | Freq: Every day | CUTANEOUS | Status: DC
  Administered 2022-04-06 – 2022-04-09 (×4): 6 via TOPICAL

## 2022-04-06 MED ORDER — CYANOCOBALAMIN 1000 MCG/ML IJ SOLN
1000.0000 ug | INTRAMUSCULAR | Status: DC
  Administered 2022-04-06: 1000 ug via INTRAMUSCULAR
  Filled 2022-04-06: qty 1

## 2022-04-06 MED ORDER — SODIUM CHLORIDE 0.9 % IV BOLUS
1000.0000 mL | Freq: Once | INTRAVENOUS | Status: AC
  Administered 2022-04-06: 1000 mL via INTRAVENOUS

## 2022-04-06 MED ORDER — ORAL CARE MOUTH RINSE
15.0000 mL | OROMUCOSAL | Status: DC | PRN
Start: 2022-04-06 — End: 2022-04-10

## 2022-04-06 MED ORDER — SODIUM CHLORIDE 0.9 % IV BOLUS: 500.0000 mL | Freq: Once | INTRAVENOUS | Status: DC

## 2022-04-06 MED ORDER — MUPIROCIN 2 % EX OINT
1.0000 | TOPICAL_OINTMENT | Freq: Two times a day (BID) | CUTANEOUS | Status: DC
  Administered 2022-04-06 – 2022-04-08 (×6): 1 via NASAL
  Filled 2022-04-06: qty 22

## 2022-04-06 MED ORDER — HEPARIN SODIUM (PORCINE) 5000 UNIT/ML IJ SOLN
5000.0000 [IU] | Freq: Three times a day (TID) | INTRAMUSCULAR | Status: DC
  Administered 2022-04-06 – 2022-04-09 (×9): 5000 [IU] via SUBCUTANEOUS
  Filled 2022-04-06 (×11): qty 1

## 2022-04-06 NOTE — ED Provider Notes (Signed)
[facility] Provider [facility]  CSN: [id number] Arrival date & time: 04/05/22 1840  Chief Complaint(s) Altered Mental Status  HPI Robert CarbonJoe Hurst is a 88 y.o. male with PMH Alzheimer's disease, previous DVT currently not on anticoagulation, CKD 3, hypothyroidism who presents emergency department for evaluation of altered mental status.  Patient has at least 6 hospital admissions for similar presentation.  Nursing home states that the patient is normally awake and talkative but today the patient is no longer talking and is moaning incoherently.  Patient arrives hypotensive in the 70s and bradycardic with rates in the 40s.  Additional history unable to be obtained as patient is currently nonverbal and altered.   Past Medical History Past Medical History:  Diagnosis Date   Alzheimer disease (HCC)    Calculus of bile duct (any) with acute cholecystitis    Cognitive communication deficit    COVID-19    Dementia (HCC)    DVT (deep venous thrombosis) (HCC)    Dysphagia    Malnourished (HCC)    Metabolic encephalopathy    Protein calorie malnutrition (HCC)    Renal disorder    Syncope and collapse    Thyroid disease    Patient Active Problem List   Diagnosis Date Noted   Pain due to onychomycosis of toenails of both feet 10/09/2021   Elevated lactic acid level    Episode of unresponsiveness 07/16/2021   Severe sepsis (HCC) 02/10/2021   COVID-19    Advanced care planning/counseling discussion    Palliative care by specialist    Sepsis due to undetermined organism (HCC) 05/22/2020   Hypotension due to hypovolemia    Thrombocytopenia (HCC) 05/03/2020   Sepsis due to Enterococcus (HCC) 05/03/2020   Acute on chronic renal insufficiency    UTI (urinary tract infection) 04/30/2020   CKD (chronic kidney disease), stage III (HCC) 02/12/2020   Hypothyroidism 02/12/2020   History of DVT (deep vein thrombosis)    History of COVID-19    Acute cystitis without hematuria     Goals of care, counseling/discussion    Palliative care encounter    Acute metabolic encephalopathy 08/11/2019   Syncope and collapse 08/11/2019   Syncope    Dementia without behavioral disturbance (HCC) 08/08/2019   Acute renal failure superimposed on stage 3b chronic kidney disease (HCC) 08/08/2019   Lactic acidosis 08/08/2019   Incontinence 08/08/2019   Home Medication(s) Prior to Admission medications   Medication Sig Start Date End Date Taking? Authorizing Provider  acetaminophen (TYLENOL) 325 MG tablet Take 2 tablets (650 mg total) by mouth every 6 (six) hours as needed for mild pain, fever or headache (fever >/= 101). 05/26/20   Johnson, Clanford L, MD  cyanocobalamin (,VITAMIN B-12,) 1000 MCG/ML injection Inject 1 mL (1,000 mcg total) into the muscle every 30 (thirty) days. 07/18/21   Shon HaleEmokpae, Courage, MD  feeding supplement (ENSURE ENLIVE / ENSURE PLUS) LIQD Take 237 mLs by mouth 3 (three) times daily. 02/13/21   Shon HaleEmokpae, Courage, MD  levothyroxine (SYNTHROID) 25 MCG tablet Take 1 tablet (25 mcg total) by mouth daily before breakfast. 06/27/21   Mariea ClontsEmokpae, Courage, MD  memantine (NAMENDA) 10 MG tablet Take 10 mg by mouth 2 (two) times daily.    [provider]  ondansetron (ZOFRAN) 4 MG tablet Take 1 tablet (4 mg total) by mouth every 6 (six) hours as needed for nausea. 02/13/21   Shon HaleEmokpae, Courage, MD  Potassium Chloride ER 20 MEQ TBCR Take 20 mEq by mouth daily. 1 tab daily by mouth  02/13/21   Shon Hale, MD  senna-docusate (SENOKOT-S) 8.6-50 MG tablet Take 2 tablets by mouth at bedtime. 06/27/21 06/27/22  Shon Hale, MD                                                                                                                                    Past Surgical History Past Surgical History:  Procedure Laterality Date   APPENDECTOMY     BALLOON DILATION N/A 01/15/2018   Procedure: BALLOON DILATION;  Surgeon: Malissa Hippo, MD;  Location: [facility];  Service:  Endoscopy;  Laterality: N/A;   ERCP N/A 01/15/2018   Procedure: ENDOSCOPIC RETROGRADE CHOLANGIOPANCREATOGRAPHY (ERCP);  Surgeon: Malissa Hippo, MD;  Location: [facility];  Service: Endoscopy;  Laterality: N/A;   REMOVAL OF STONES N/A 01/15/2018   Procedure: REMOVAL OF STONES;  Surgeon: Malissa Hippo, MD;  Location: [facility];  Service: Endoscopy;  Laterality: N/A;   SPHINCTEROTOMY N/A 01/15/2018   Procedure: SPHINCTEROTOMY;  Surgeon: Malissa Hippo, MD;  Location: [facility];  Service: Endoscopy;  Laterality: N/A;   TONSILLECTOMY     Family History History reviewed. No pertinent family history.  Social History Social History   Tobacco Use   Smoking status: Never   Smokeless tobacco: Never  Vaping Use   Vaping Use: Unknown  Substance Use Topics   Alcohol use: Not Currently   Drug use: Not Currently   Allergies Penicillins  Review of Systems Review of Systems  Unable to perform ROS: Dementia    Physical Exam Vital Signs  I have reviewed the triage vital signs BP 121/60   Pulse (!) 46   Temp 98.3 F (36.8 C) (Oral)   Resp 15   SpO2 92%   Physical Exam Constitutional:      General: He is not in acute distress.    Appearance: Normal appearance. He is ill-appearing.     Comments: Cachectic  HENT:     Head: Normocephalic and atraumatic.     Nose: No congestion or rhinorrhea.  Eyes:     General:        Right eye: No discharge.        Left eye: No discharge.     Extraocular Movements: Extraocular movements intact.     Pupils: Pupils are equal, round, and reactive to light.  Cardiovascular:     Rate and Rhythm: Regular rhythm. Bradycardia present.     Heart sounds: No murmur heard. Pulmonary:     Effort: No respiratory distress.     Breath sounds: No wheezing or rales.  Abdominal:     General: There is no distension.     Tenderness: There is no abdominal tenderness.  Musculoskeletal:        General: Normal range of motion.     Cervical back:  Normal range of motion.  Skin:    General: Skin is warm and dry.  Neurological:  General: No focal deficit present.     Mental Status: He is alert.     ED Results and Treatments Labs (all labs ordered are listed, but only abnormal results are displayed) Labs Reviewed  LACTIC ACID, PLASMA - Abnormal; Notable for the following components:      Result Value   Lactic Acid, Venous 4.5 (*)    All other components within normal limits  LACTIC ACID, PLASMA - Abnormal; Notable for the following components:   Lactic Acid, Venous 3.1 (*)    All other components within normal limits  COMPREHENSIVE METABOLIC PANEL - Abnormal; Notable for the following components:   Sodium 146 (*)    Glucose, Bld 113 (*)    Creatinine, Ser 1.89 (*)    GFR, Estimated 34 (*)    All other components within normal limits  URINALYSIS, ROUTINE W REFLEX MICROSCOPIC - Abnormal; Notable for the following components:   Hgb urine dipstick SMALL (*)    Protein, ur 30 (*)    Leukocytes,Ua TRACE (*)    Bacteria, UA RARE (*)    All other components within normal limits  I-STAT CHEM 8, ED - Abnormal; Notable for the following components:   Sodium 147 (*)    Creatinine, Ser 1.80 (*)    Glucose, Bld 108 (*)    Hemoglobin 18.0 (*)    HCT 53.0 (*)    All other components within normal limits  CULTURE, BLOOD (ROUTINE X 2)  CULTURE, BLOOD (ROUTINE X 2)  URINE CULTURE  MRSA NEXT GEN BY PCR, NASAL  CBC WITH DIFFERENTIAL/PLATELET  PROTIME-INR  APTT                                                                                                                          Radiology CT Head Wo Contrast  Result Date: 04/06/2022 CLINICAL DATA:  Delirium EXAM: CT HEAD WITHOUT CONTRAST TECHNIQUE: Contiguous axial images were obtained from the base of the skull through the vertex without intravenous contrast. RADIATION DOSE REDUCTION: This exam was performed according to the departmental dose-optimization program which includes  automated exposure control, adjustment of the mA and/or kV according to patient size and/or use of iterative reconstruction technique. COMPARISON:  07/16/2021 FINDINGS: Brain: Normal anatomic configuration. Parenchymal volume loss is commensurate with the patient's age. Moderate, stable periventricular white matter changes are present likely reflecting the sequela of small vessel ischemia. Stable small chronic left occipital infarct again noted. No abnormal intra or extra-axial mass lesion or fluid collection. No abnormal mass effect or midline shift. No evidence of acute intracranial hemorrhage or infarct. Ventricular size is normal. Cerebellum unremarkable. Vascular: No asymmetric hyperdense vasculature at the skull base. Skull: Intact Sinuses/Orbits: Paranasal sinuses are clear. Orbits are unremarkable. Other: Mastoid air cells and middle ear cavities are clear. IMPRESSION: 1. No acute intracranial hemorrhage or infarct. 2. Stable senescent changes and remote left occipital infarct. Electronically Signed   By: Helyn Numbers M.D.   On: 04/06/2022 00:04  CT ABDOMEN PELVIS WO CONTRAST  Result Date: 04/05/2022 CLINICAL DATA:  Flank pain, kidney stone suspected Sepsis EXAM: CT ABDOMEN AND PELVIS WITHOUT CONTRAST TECHNIQUE: Multidetector CT imaging of the abdomen and pelvis was performed following the standard protocol without IV contrast. RADIATION DOSE REDUCTION: This exam was performed according to the departmental dose-optimization program which includes automated exposure control, adjustment of the mA and/or kV according to patient size and/or use of iterative reconstruction technique. COMPARISON:  07/16/2021 FINDINGS: Lower chest: No acute abnormality. Hepatobiliary: Prior cholecystectomy. Pneumobilia, stable since prior study. No biliary ductal dilatation. Pancreas: No focal abnormality or ductal dilatation. Spleen: No focal abnormality.  Normal size. Adrenals/Urinary Tract: Large bilateral renal cysts,  stable since prior study. No follow-up imaging recommended. Bilateral renal stones, the largest on the right in the upper pole measuring 18 mm. The largest on the left in the lower pole measuring 10 mm. No ureteral stones or hydronephrosis. Adrenal glands unremarkable. A few bladder wall diverticula noted, unchanged since prior study. Mild bladder wall thickening is stable since prior study, likely related to chronic bladder outlet obstruction. Stomach/Bowel: Normal appendix. Stomach, large and small bowel grossly unremarkable. Vascular/Lymphatic: Aortic atherosclerosis. No evidence of aneurysm or adenopathy. Reproductive: Prostate enlargement. Other: No free fluid or free air. Musculoskeletal: Sclerotic area within the left iliac bone is stable, likely bone island. Degenerative changes in the lumbar spine. IMPRESSION: Bilateral nephrolithiasis.  No ureteral stones or hydronephrosis. Numerous bilateral renal cysts are stable since prior study. Prostate enlargement. Mild bladder wall thickening and several bladder wall diverticula, unchanged. No acute findings. Electronically Signed   By: Charlett Nose M.D.   On: 04/05/2022 23:55   DG Chest Port 1 View  Result Date: 04/05/2022 CLINICAL DATA:  Sepsis EXAM: PORTABLE CHEST 1 VIEW COMPARISON:  07/16/2021 FINDINGS: 2 frontal views of the chest demonstrate an unremarkable cardiac silhouette. Chronic scarring throughout the lungs without focal airspace disease, effusion, or pneumothorax. No acute bony abnormalities. Bilateral renal calculi are identified, measuring up to 2.1 cm on the right and 0.8 cm on the left. IMPRESSION: 1. No acute intrathoracic process.  Stable scarring. 2. Continued bilateral renal calculi as above. Electronically Signed   By: Sharlet Salina M.D.   On: 04/05/2022 20:16    Pertinent labs & imaging results that were available during my care of the patient were reviewed by me and considered in my medical decision making (see MDM for  details).  Medications Ordered in ED Medications  ceFEPIme (MAXIPIME) 2 g in sodium chloride 0.9 % 100 mL IVPB (has no administration in time range)  lactated ringers infusion (has no administration in time range)  lactated ringers bolus 1,000 mL (0 mLs Intravenous Stopped 04/05/22 2112)  vancomycin (VANCOCIN) IVPB 1000 mg/200 mL premix (0 mg Intravenous Stopped 04/05/22 2304)  lactated ringers bolus 1,000 mL (1,000 mLs Intravenous New Bag/Given 04/05/22 2304)  Procedures .Critical Care  Performed by: Glendora Score, MD Authorized by: Glendora Score, MD   Critical care provider statement:    Critical care time (minutes):  75   Critical care was necessary to treat or prevent imminent or life-threatening deterioration of the following conditions:  Dehydration   Critical care was time spent personally by me on the following activities:  Development of treatment plan with patient or surrogate, discussions with consultants, evaluation of patient's response to treatment, examination of patient, ordering and review of laboratory studies, ordering and review of radiographic studies, ordering and performing treatments and interventions, pulse oximetry, re-evaluation of patient's condition and review of old charts   (including critical care time)  Medical Decision Making / ED Course   This patient presents to the ED for concern of altered mental status, this involves an extensive number of treatment options, and is a complaint that carries with it a high risk of complications and morbidity.  The differential diagnosis includes dehydration, UTI, sepsis, pneumonia, metabolic encephalopathy  MDM: Patient seen emergency room for evaluation of somnolence and hypotension.  Physical exam reveals a cachectic appearing elderly patient nonparticipatory in exam and intermittently  moaning.  Patient started on aggressive fluid resuscitation for hypotension which led to improvement of his hypotension.  Given altered mental status and hypotension, broad-spectrum antibiotics initiated, but laboratory evaluation is not showing significant leukocytosis and with appropriate fluid resuscitation, vital signs are improving.  Laboratory evaluation with hypernatremia to 146, creatinine elevated to 1.89 which is an elevation for this patient, urinalysis with trace leuk esterase, 11-20 red blood cells, 6-20 white blood cells and rare bacteria.  Lactic acid four-point 6 repeat is 3.1.  At time of signout, CT abdomen pelvis and CT head are pending.  Regardless of findings, patient will require hospital admission for rehydration.  Patient then signed out on come provider.  PC provider signout for continuation of work-up.   Additional history obtained:  -External records from outside source obtained and reviewed including: Chart review including previous notes, labs, imaging, consultation notes   Lab Tests: -I ordered, reviewed, and interpreted labs.   The pertinent results include:   Labs Reviewed  LACTIC ACID, PLASMA - Abnormal; Notable for the following components:      Result Value   Lactic Acid, Venous 4.5 (*)    All other components within normal limits  LACTIC ACID, PLASMA - Abnormal; Notable for the following components:   Lactic Acid, Venous 3.1 (*)    All other components within normal limits  COMPREHENSIVE METABOLIC PANEL - Abnormal; Notable for the following components:   Sodium 146 (*)    Glucose, Bld 113 (*)    Creatinine, Ser 1.89 (*)    GFR, Estimated 34 (*)    All other components within normal limits  URINALYSIS, ROUTINE W REFLEX MICROSCOPIC - Abnormal; Notable for the following components:   Hgb urine dipstick SMALL (*)    Protein, ur 30 (*)    Leukocytes,Ua TRACE (*)    Bacteria, UA RARE (*)    All other components within normal limits  I-STAT CHEM 8, ED -  Abnormal; Notable for the following components:   Sodium 147 (*)    Creatinine, Ser 1.80 (*)    Glucose, Bld 108 (*)    Hemoglobin 18.0 (*)    HCT 53.0 (*)    All other components within normal limits  CULTURE, BLOOD (ROUTINE X 2)  CULTURE, BLOOD (ROUTINE X 2)  URINE CULTURE  MRSA NEXT GEN BY  PCR, NASAL  CBC WITH DIFFERENTIAL/PLATELET  PROTIME-INR  APTT      EKG   EKG Interpretation  Date/Time:  Friday April 05 2022 18:54:31 EDT Ventricular Rate:  50 PR Interval:  136 QRS Duration: 93 QT Interval:  484 QTC Calculation: 442 R Axis:   36 Text Interpretation: Sinus bradycardia Low voltage, precordial leads Confirmed by Celie Desrochers ([id number]) on 04/06/2022 12:25:05 AM         Imaging Studies ordered: I ordered imaging studies including chest x-ray I independently visualized and interpreted imaging. I agree with the radiologist interpretation  CT head and abdomen pelvis are pending   Medicines ordered and prescription drug management: Meds ordered this encounter  Medications   lactated ringers bolus 1,000 mL   vancomycin (VANCOCIN) IVPB 1000 mg/200 mL premix    Order Specific Question:   Indication:    Answer:   Sepsis   DISCONTD: ceFEPIme (MAXIPIME) 2 g in sodium chloride 0.9 % 100 mL IVPB    Order Specific Question:   Antibiotic Indication:    Answer:   Sepsis   ceFEPIme (MAXIPIME) 2 g in sodium chloride 0.9 % 100 mL IVPB    Order Specific Question:   Antibiotic Indication:    Answer:   Sepsis   lactated ringers bolus 1,000 mL   lactated ringers infusion    -I have reviewed the patients home medicines and have made adjustments as needed  Critical interventions Aggressive rehydration     Cardiac Monitoring: The patient was maintained on a cardiac monitor.  I personally viewed and interpreted the cardiac monitored which showed an underlying rhythm of: Sinus Bradycardia  Social Determinants of Health:  Factors impacting patients care include:  none   Reevaluation: After the interventions noted above, I reevaluated the patient and found that they have :improved  Co morbidities that complicate the patient evaluation  Past Medical History:  Diagnosis Date   Alzheimer disease (HCC)    Calculus of bile duct (any) with acute cholecystitis    Cognitive communication deficit    COVID-19    Dementia (HCC)    DVT (deep venous thrombosis) (HCC)    Dysphagia    Malnourished (HCC)    Metabolic encephalopathy    Protein calorie malnutrition (HCC)    Renal disorder    Syncope and collapse    Thyroid disease       Dispostion: I considered admission for this patient, and disposition pending CT imaging.  Anticipate admission for rehydration     Final Clinical Impression(s) / ED Diagnoses Final diagnoses:  None     @    Glendora Score, MD 04/06/22 0025

## 2022-04-06 NOTE — Progress Notes (Signed)
Patient easily aroused and stated he was hungry. Patient ate half container of peaches, drank half boost chocolate and sips of gingerale. Clear words and short phrases spoken. "I want orange juice" "I like water" "I like fruit", patient able to tell me his name, he thought he was at home. He became drowsy RASS -2 after eating. Called daughter [name] and gave update on stable status. Oliver Barre, RN

## 2022-04-06 NOTE — Progress Notes (Signed)
Patient seen and evaluated, chart reviewed, please see EMR for updated orders. Please see full H&P dictated by admitting physician Dr. Thomes Dinning for same date of service.    Brief Summary:- - 88 y.o. male with medical history significant of Alzheimer's disease, hypothyroidism, prior DVT not on anticoagulation, CKD 3B--- admitted on 04/06/2022 with acute metabolic encephalopathy superimposed on advanced dementia and concerns about possible infectious source  A/p 1) acute metabolic encephalopathy superimposed on advanced dementia---.  Due to AKI superimposed on CKD in the setting of dehydration resulting in hypernatremia -hydrate  2)AKI----acute kidney injury on CKD stage - 3B   creatinine on admission= 1.89 ,  baseline creatinine = 1.3 to 1.4    ,  creatinine is now= 1.53 -- renally adjust medications, avoid nephrotoxic agents / dehydration  / hypotension -Continue IV fluids  3) hypernatremia--- due to dehydration in setting of poor oral intake -Sodium improved to 153 from 147 with IV fluids  4) lactic acidosis--- most likely due to dehydration/poor perfusion -Need to rule out infection Continue Vanco and cefepime pending culture data  Patient seen and evaluated, chart reviewed, please see EMR for updated orders. Please see full H&P dictated by admitting physician Dr. Thomes Dinning for same date of service.   Total care time is 53 mins  Shon Hale, MD

## 2022-04-06 NOTE — H&P (Signed)
History and Physical    Patient: Robert Hurst XMI:[id number] DOB: 07/08/1932 DOA: 04/05/2022 DOS: the patient was seen and examined on 04/06/2022 PCP: [name], MD  Patient coming from: SNF  Chief Complaint:  Chief Complaint  Patient presents with   Altered Mental Status   HPI: Robert Hurst is a 88 y.o. male with medical history significant of Alzheimer's disease, hypothyroidism, prior DVT not on anticoagulation, CKD 3 who presents to the emergency department via EMS from [facility] due to concern for altered mental status.  Patient was unable to provide history, history was obtained from ED physician and ED medical record.  Per report, patient was reported to have an altered mental status which started yesterday whereby he was not talking but just moaning (at baseline, patient was usually a talkative).  No further history could be obtained at this time due to patient's current status     ED Course:  In the emergency department, he was bradycardic, BP was in hypotensive range on arrival (73/51), other vital signs were within normal range.  Work-up in the ED showed normal CBC, hyponatremia, creatinine 1.89 (baseline creatinine at 1.3-1.4).  Lactic acid 4.5 > 3.1, urinalysis was unimpressive for UTI. CT head without contrast showed no acute intracranial hemorrhage or infarct CT abdomen and pelvis without contrast showed bilateral nephrolithiasis Chest x-ray showed no acute necrotic process. Patient was empirically treated with IV vancomycin and ceftriaxone due to GI and suspicion for sepsis.  IV hydration was provided.  Hospitalist was asked to admit patient for further evaluation and management.  Review of Systems: Review of systems as noted in the HPI. All other systems reviewed and are negative.   Past Medical History:  Diagnosis Date   Alzheimer disease (HCC)    Calculus of bile duct (any) with acute cholecystitis    Cognitive communication deficit    COVID-19    Dementia  (HCC)    DVT (deep venous thrombosis) (HCC)    Dysphagia    Malnourished (HCC)    Metabolic encephalopathy    Protein calorie malnutrition (HCC)    Renal disorder    Syncope and collapse    Thyroid disease    Past Surgical History:  Procedure Laterality Date   APPENDECTOMY     BALLOON DILATION N/A 01/15/2018   Procedure: BALLOON DILATION;  Surgeon: Malissa Hippo, MD;  Location: [facility];  Service: Endoscopy;  Laterality: N/A;   ERCP N/A 01/15/2018   Procedure: ENDOSCOPIC RETROGRADE CHOLANGIOPANCREATOGRAPHY (ERCP);  Surgeon: Malissa Hippo, MD;  Location: [facility];  Service: Endoscopy;  Laterality: N/A;   REMOVAL OF STONES N/A 01/15/2018   Procedure: REMOVAL OF STONES;  Surgeon: Malissa Hippo, MD;  Location: [facility];  Service: Endoscopy;  Laterality: N/A;   SPHINCTEROTOMY N/A 01/15/2018   Procedure: SPHINCTEROTOMY;  Surgeon: Malissa Hippo, MD;  Location: [facility];  Service: Endoscopy;  Laterality: N/A;   TONSILLECTOMY      Social History:  reports that he has never smoked. He has never used smokeless tobacco. He reports that he does not currently use alcohol. He reports that he does not currently use drugs.   Allergies  Allergen Reactions   Penicillins Other (See Comments)    Unknown reaction    History reviewed. No pertinent family history.   Prior to Admission medications   Medication Sig Start Date End Date Taking? Authorizing Provider  acetaminophen (TYLENOL) 325 MG tablet Take 2 tablets (650 mg total) by mouth every 6 (  six) hours as needed for mild pain, fever or headache (fever >/= 101). 05/26/20   Johnson, Clanford L, MD  cyanocobalamin (,VITAMIN B-12,) 1000 MCG/ML injection Inject 1 mL (1,000 mcg total) into the muscle every 30 (thirty) days. 07/18/21   Shon Hale, MD  feeding supplement (ENSURE ENLIVE / ENSURE PLUS) LIQD Take 237 mLs by mouth 3 (three) times daily. 02/13/21   Shon Hale, MD  levothyroxine (SYNTHROID) 25 MCG tablet  Take 1 tablet (25 mcg total) by mouth daily before breakfast. 06/27/21   Mariea Clonts, Courage, MD  memantine (NAMENDA) 10 MG tablet Take 10 mg by mouth 2 (two) times daily.    [provider]  ondansetron (ZOFRAN) 4 MG tablet Take 1 tablet (4 mg total) by mouth every 6 (six) hours as needed for nausea. 02/13/21   Shon Hale, MD  Potassium Chloride ER 20 MEQ TBCR Take 20 mEq by mouth daily. 1 tab daily by mouth 02/13/21   Emokpae, Courage, MD  senna-docusate (SENOKOT-S) 8.6-50 MG tablet Take 2 tablets by mouth at bedtime. 06/27/21 06/27/22  Shon Hale, MD    Physical Exam: BP (!) 102/57   Pulse (!) 53   Temp 98.3 F (36.8 C) (Oral)   Resp 16   SpO2 94%   General: 88 y.o. year-old male cachetic, but in no acute distress.  Alert and oriented x 1 (person). HEENT: NCAT, EOMI Neck: Supple, trachea medial Cardiovascular: Bradycardia.  Regular rate and rhythm with no rubs or gallops.  No thyromegaly or JVD noted.  No lower extremity edema. 2/4 pulses in all 4 extremities. Respiratory: Clear to auscultation with no wheezes or rales. Good inspiratory effort. Abdomen: Soft, nontender nondistended with normal bowel sounds x4 quadrants. Muskuloskeletal: No cyanosis, clubbing or edema noted bilaterally Neuro: No focal neurologic deficits, sensation, reflexes intact Skin: No ulcerative lesions noted or rashes Psychiatry:  Mood is appropriate for condition and setting          Labs on Admission:  Basic Metabolic Panel: Recent Labs  Lab 04/05/22 1928 04/05/22 1953  NA 146* 147*  K 4.7 4.7  CL 109 108  CO2 26  --   GLUCOSE 113* 108*  BUN 17 19  CREATININE 1.89* 1.80*  CALCIUM 9.7  --    Liver Function Tests: Recent Labs  Lab 04/05/22 1928  AST 19  ALT 11  ALKPHOS 86  BILITOT 0.8  PROT 7.8  ALBUMIN 3.9   No results for input(s): "LIPASE", "AMYLASE" in the last 168 hours. No results for input(s): "AMMONIA" in the last 168 hours. CBC: Recent Labs  Lab 04/05/22 1928  04/05/22 1953  WBC 7.0  --   NEUTROABS 4.9  --   HGB 16.7 18.0*  HCT 50.7 53.0*  MCV 89.4  --   PLT 184  --    Cardiac Enzymes: No results for input(s): "CKTOTAL", "CKMB", "CKMBINDEX", "TROPONINI" in the last 168 hours.  BNP (last 3 results) No results for input(s): "BNP" in the last 8760 hours.  ProBNP (last 3 results) No results for input(s): "PROBNP" in the last 8760 hours.  CBG: No results for input(s): "GLUCAP" in the last 168 hours.  Radiological Exams on Admission: CT Head Wo Contrast  Result Date: 04/06/2022 CLINICAL DATA:  Delirium EXAM: CT HEAD WITHOUT CONTRAST TECHNIQUE: Contiguous axial images were obtained from the base of the skull through the vertex without intravenous contrast. RADIATION DOSE REDUCTION: This exam was performed according to the departmental dose-optimization program which includes automated exposure control, adjustment of the mA  and/or kV according to patient size and/or use of iterative reconstruction technique. COMPARISON:  07/16/2021 FINDINGS: Brain: Normal anatomic configuration. Parenchymal volume loss is commensurate with the patient's age. Moderate, stable periventricular white matter changes are present likely reflecting the sequela of small vessel ischemia. Stable small chronic left occipital infarct again noted. No abnormal intra or extra-axial mass lesion or fluid collection. No abnormal mass effect or midline shift. No evidence of acute intracranial hemorrhage or infarct. Ventricular size is normal. Cerebellum unremarkable. Vascular: No asymmetric hyperdense vasculature at the skull base. Skull: Intact Sinuses/Orbits: Paranasal sinuses are clear. Orbits are unremarkable. Other: Mastoid air cells and middle ear cavities are clear. IMPRESSION: 1. No acute intracranial hemorrhage or infarct. 2. Stable senescent changes and remote left occipital infarct. Electronically Signed   By: Helyn Numbers M.D.   On: 04/06/2022 00:04   CT ABDOMEN PELVIS WO  CONTRAST  Result Date: 04/05/2022 CLINICAL DATA:  Flank pain, kidney stone suspected Sepsis EXAM: CT ABDOMEN AND PELVIS WITHOUT CONTRAST TECHNIQUE: Multidetector CT imaging of the abdomen and pelvis was performed following the standard protocol without IV contrast. RADIATION DOSE REDUCTION: This exam was performed according to the departmental dose-optimization program which includes automated exposure control, adjustment of the mA and/or kV according to patient size and/or use of iterative reconstruction technique. COMPARISON:  07/16/2021 FINDINGS: Lower chest: No acute abnormality. Hepatobiliary: Prior cholecystectomy. Pneumobilia, stable since prior study. No biliary ductal dilatation. Pancreas: No focal abnormality or ductal dilatation. Spleen: No focal abnormality.  Normal size. Adrenals/Urinary Tract: Large bilateral renal cysts, stable since prior study. No follow-up imaging recommended. Bilateral renal stones, the largest on the right in the upper pole measuring 18 mm. The largest on the left in the lower pole measuring 10 mm. No ureteral stones or hydronephrosis. Adrenal glands unremarkable. A few bladder wall diverticula noted, unchanged since prior study. Mild bladder wall thickening is stable since prior study, likely related to chronic bladder outlet obstruction. Stomach/Bowel: Normal appendix. Stomach, large and small bowel grossly unremarkable. Vascular/Lymphatic: Aortic atherosclerosis. No evidence of aneurysm or adenopathy. Reproductive: Prostate enlargement. Other: No free fluid or free air. Musculoskeletal: Sclerotic area within the left iliac bone is stable, likely bone island. Degenerative changes in the lumbar spine. IMPRESSION: Bilateral nephrolithiasis.  No ureteral stones or hydronephrosis. Numerous bilateral renal cysts are stable since prior study. Prostate enlargement. Mild bladder wall thickening and several bladder wall diverticula, unchanged. No acute findings. Electronically Signed    By: Charlett Nose M.D.   On: 04/05/2022 23:55   DG Chest Port 1 View  Result Date: 04/05/2022 CLINICAL DATA:  Sepsis EXAM: PORTABLE CHEST 1 VIEW COMPARISON:  07/16/2021 FINDINGS: 2 frontal views of the chest demonstrate an unremarkable cardiac silhouette. Chronic scarring throughout the lungs without focal airspace disease, effusion, or pneumothorax. No acute bony abnormalities. Bilateral renal calculi are identified, measuring up to 2.1 cm on the right and 0.8 cm on the left. IMPRESSION: 1. No acute intrathoracic process.  Stable scarring. 2. Continued bilateral renal calculi as above. Electronically Signed   By: Sharlet Salina M.D.   On: 04/05/2022 20:16    EKG: I independently viewed the EKG done and my findings are as followed: Sinus bradycardia at a rate of 50 bpm  Assessment/Plan Present on Admission:  Altered mental status  Lactic acidosis  Principal Problem:   Altered mental status Active Problems:   Alzheimer's disease (HCC)   Acute kidney injury superimposed on chronic kidney disease (HCC)   Lactic acidosis   Acquired hypothyroidism  Dehydration  Altered mental status Cause of patient's altered mental status is currently unknown He was empirically started on vancomycin and ceftriaxone,, we shall continue with vancomycin and cefepime at this time with plan to de-escalate/discontinue based on procalcitonin, urine culture and blood culture. Continue Tylenol as needed Continue home vitamin B12  Acute kidney injury on CKD 3A Dehydration creatinine 1.89 (baseline creatinine at 1.3-1.4).   Continue IV hydration Renally adjust medications, avoid nephrotoxic agents/dehydration/hypotension  Lactic acidosis possibly secondary to multifactorial Lactic acid 4.5 > 3.1 Continue IV hydration and continue to trend lactic acid.  Acquired hypothyroidism Continue Synthroid  Alzheimer's disease Continue Namenda  DVT prophylaxis: Heparin subcu   Advance Care Planning:   Code  Status: Prior   Consults: None  Family Communication: None at bedside  Severity of Illness: The appropriate patient status for this patient is OBSERVATION. Observation status is judged to be reasonable and necessary in order to provide the required intensity of service to ensure the patient's safety. The patient's presenting symptoms, physical exam findings, and initial radiographic and laboratory data in the context of their medical condition is felt to place them at decreased risk for further clinical deterioration. Furthermore, it is anticipated that the patient will be medically stable for discharge from the hospital within 2 midnights of admission.   Author: Frankey Shown, DO 04/06/2022 5:38 AM  For on call review [email or web address].

## 2022-04-07 DIAGNOSIS — G9341 Metabolic encephalopathy: Secondary | ICD-10-CM

## 2022-04-07 DIAGNOSIS — N179 Acute kidney failure, unspecified: Secondary | ICD-10-CM | POA: Diagnosis not present

## 2022-04-07 DIAGNOSIS — R410 Disorientation, unspecified: Secondary | ICD-10-CM | POA: Diagnosis not present

## 2022-04-07 DIAGNOSIS — E860 Dehydration: Secondary | ICD-10-CM | POA: Diagnosis not present

## 2022-04-07 DIAGNOSIS — E039 Hypothyroidism, unspecified: Secondary | ICD-10-CM | POA: Diagnosis not present

## 2022-04-07 LAB — BASIC METABOLIC PANEL
Anion gap: 5 (ref 5–15)
BUN: 17 mg/dL (ref 8–23)
CO2: 26 mmol/L (ref 22–32)
Calcium: 8.6 mg/dL — ABNORMAL LOW (ref 8.9–10.3)
Chloride: 112 mmol/L — ABNORMAL HIGH (ref 98–111)
Creatinine, Ser: 1.4 mg/dL — ABNORMAL HIGH (ref 0.61–1.24)
GFR, Estimated: 48 mL/min — ABNORMAL LOW (ref >60)
Glucose, Bld: 70 mg/dL (ref 70–99)
Potassium: 4.3 mmol/L (ref 3.5–5.1)
Sodium: 143 mmol/L (ref 135–145)

## 2022-04-07 LAB — URINE CULTURE: Culture: NO GROWTH

## 2022-04-07 LAB — CBC
HCT: 37 % — ABNORMAL LOW (ref 39.0–52.0)
Hemoglobin: 12.3 g/dL — ABNORMAL LOW (ref 13.0–17.0)
MCH: 29.8 pg (ref 26.0–34.0)
MCHC: 33.2 g/dL (ref 30.0–36.0)
MCV: 89.6 fL (ref 80.0–100.0)
Platelets: DECREASED 10*3/uL (ref 150–400)
RBC: 4.13 MIL/uL — ABNORMAL LOW (ref 4.22–5.81)
RDW: 14.6 % (ref 11.5–15.5)
WBC: 3.6 10*3/uL — ABNORMAL LOW (ref 4.0–10.5)
nRBC: 0 % (ref 0.0–0.2)

## 2022-04-07 MED ORDER — VANCOMYCIN HCL 1250 MG/250ML IV SOLN
1250.0000 mg | INTRAVENOUS | Status: DC
  Administered 2022-04-07: 1250 mg via INTRAVENOUS
  Filled 2022-04-07: qty 250

## 2022-04-07 MED ORDER — DEXTROSE-NACL 5-0.45 % IV SOLN: INTRAVENOUS | Status: DC

## 2022-04-07 NOTE — Progress Notes (Signed)
PROGRESS NOTE     Robert Hurst, is a 88 y.o. male, DOB - 1932/05/26, [id number]  Admit date - 04/05/2022   Admitting Physician Elic Vencill Mariea Clonts, MD  Outpatient Primary MD for the patient is [name], MD  LOS - 1  Chief Complaint  Patient presents with   Altered Mental Status       Brief Summary:- - 88 y.o. male with medical history significant of Alzheimer's disease, hypothyroidism, prior DVT not on anticoagulation, CKD 3B--- [facility] on 04/06/2022 with acute metabolic encephalopathy superimposed on advanced dementia and concerns about possible infectious source    -Assessment and Plan:   A/p 1)Acute metabolic encephalopathy superimposed on advanced dementia---  Due to AKI superimposed on CKD in the setting of dehydration resulting in hypernatremia -Given IV antibiotics and IV fluids   2)AKI----acute kidney injury on CKD stage - 3B   creatinine on admission= 1.89 ,  baseline creatinine = 1.3 to 1.4    ,  creatinine is now= 1.40 -- renally adjust medications, avoid nephrotoxic agents / dehydration  / hypotension -Continue IV fluids   3)Hypernatremia--- due to dehydration in setting of poor oral intake -Sodium improved to 143 from 147 with IV fluids   4)Lactic Acidosis--- most likely due to dehydration/poor perfusion -Need to rule out infection Continue Vanco and cefepime pending culture data  5)Nutrition--- oral intake is not great, change IV fluids to D5 half-normal -Give nutritional supplements  6)Dementia--- stable, continue Namenda  7)Hypothyroidism--- continue levothyroxine  8)Pancytopenia-----No bleeding concerns, continue to monitor  Disposition/Need for in-Hospital Stay- patient unable to be discharged at this time due to Acute Metabolic Encephalopathy and Dehydration requiring IVF and iv antibiotics pending culture data  Status is: Inpatient   Disposition: The patient is from: SNF              Anticipated d/c is to: SNF               Anticipated d/c date is: 3 days              Patient currently is not medically stable to d/c. Barriers: Not Clinically Stable-   Code Status :  -  Code Status: DNR   Family Communication:   Daughters [name] and [place]  DVT Prophylaxis  :   - SCDs   heparin injection 5,000 Units Start: 04/06/22 0700 SCDs Start: 04/06/22 0651   Lab Results  Component Value Date   PLT PLATELETS APPEAR DECREASED 04/07/2022   Inpatient Medications  Scheduled Meds:  Chlorhexidine Gluconate Cloth  6 each Topical Daily   cyanocobalamin  1,000 mcg Intramuscular Q30 days   feeding supplement  237 mL Oral TID   heparin  5,000 Units Subcutaneous Q8H   levothyroxine  25 mcg Oral QAC breakfast   memantine  10 mg Oral BID   mupirocin ointment  1 Application Nasal BID   mouth rinse  15 mL Mouth Rinse 4 times per day   Continuous Infusions:  ceFEPime (MAXIPIME) IV 2 g (04/06/22 2034)   lactated ringers 125 mL/hr at 04/07/22 0949   vancomycin     PRN Meds:.acetaminophen, mouth rinse   Anti-infectives (From admission, onward)    Start     Dose/Rate Route Frequency Ordered Stop   04/07/22 2100  vancomycin (VANCOREADY) IVPB 1250 mg/250 mL        1,250 mg 166.7 mL/hr over 90 Minutes Intravenous Every 48 hours 04/07/22 0753     04/06/22 2130  ceFEPIme (MAXIPIME) 2 g  in sodium chloride 0.9 % 100 mL IVPB        2 g 200 mL/hr over 30 Minutes Intravenous Every 24 hours 04/05/22 2137     04/06/22 0100  cefTRIAXone (ROCEPHIN) 2 g in sodium chloride 0.9 % 100 mL IVPB        2 g 200 mL/hr over 30 Minutes Intravenous  Once 04/06/22 0058 04/06/22 0549   04/05/22 2130  vancomycin (VANCOCIN) IVPB 1000 mg/200 mL premix        1,000 mg 200 mL/hr over 60 Minutes Intravenous  Once 04/05/22 2126 04/05/22 2304   04/05/22 2130  ceFEPIme (MAXIPIME) 2 g in sodium chloride 0.9 % 100 mL IVPB  Status:  Discontinued        2 g 200 mL/hr over 30 Minutes Intravenous  Once 04/05/22 2126 04/05/22 2137        Subjective: Robert Hurst today has no fevers, no emesis,  No chest pain,  Oral intake is poor  Objective: Vitals:   04/07/22 0722 04/07/22 0900 04/07/22 1000 04/07/22 1124  BP:  111/66 (!) 107/34   Pulse:  67 64   Resp:  17 16   Temp: (!) 97.2 F (36.2 C)   97.7 F (36.5 C)  TempSrc: Axillary   Oral  SpO2:  91% 94%   Weight:      Height:        Intake/Output Summary (Last 24 hours) at 04/07/2022 1145 Last data filed at 04/07/2022 0949 Gross per 24 hour  Intake 3835.88 ml  Output 2750 ml  Net 1085.88 ml   Filed Weights   04/06/22 1026 04/07/22 0500  Weight: 56.1 kg 56.6 kg    Physical Exam  Gen:- resting,in no apparent distress  HEENT:- [place].AT, No sclera icterus Neck-Supple Neck,No JVD,.  Lungs- fair symmetrical air movement, no wheezing CV- S1, S2 normal, regular  Abd-  +ve B.Sounds, Abd Soft, No tenderness,    Extremity/Skin:- No  edema, pedal pulses present  Psych-affect is appropriate, cognitive and memory deficits noted Neuro-generalized weakness, no new focal deficits, no tremors  Data Reviewed: I have personally reviewed following labs and imaging studies  CBC: Recent Labs  Lab 04/05/22 1928 04/05/22 1953 04/06/22 0543 04/07/22 0401  WBC 7.0  --  4.9 3.6*  NEUTROABS 4.9  --   --   --   HGB 16.7 18.0* 13.0 12.3*  HCT 50.7 53.0* 38.6* 37.0*  MCV 89.4  --  89.1 89.6  PLT 184  --  132* PLATELETS APPEAR DECREASED   Basic Metabolic Panel: Recent Labs  Lab 04/05/22 1928 04/05/22 1953 04/06/22 0543 04/07/22 0542  NA 146* 147* 143 143  K 4.7 4.7 4.4 4.3  CL 109 108 112* 112*  CO2 26  --  24 26  GLUCOSE 113* 108* 84 70  BUN 17 19 14 17   CREATININE 1.89* 1.80* 1.53* 1.40*  CALCIUM 9.7  --  8.7* 8.6*  MG  --   --  1.8  --   PHOS  --   --  2.8  --    GFR: Estimated Creatinine Clearance: 29.2 mL/min (A) (by C-G formula based on SCr of 1.4 mg/dL (H)). Liver Function Tests: Recent Labs  Lab 04/05/22 1928 04/06/22 0543  AST 19 13*  ALT 11 7   ALKPHOS 86 59  BILITOT 0.8 0.3  PROT 7.8 5.5*  ALBUMIN 3.9 2.7*   Cardiac Enzymes: No results for input(s): "CKTOTAL", "CKMB", "CKMBINDEX", "TROPONINI" in the last 168 hours. BNP (last 3 results)  No results for input(s): "PROBNP" in the last 8760 hours. HbA1C: No results for input(s): "HGBA1C" in the last 72 hours. Sepsis Labs: @LABRCNTIP (procalcitonin:4,lacticidven:4) ) Recent Results (from the past 240 hour(s))  Urine Culture     Status: None   Collection Time: 04/05/22  7:08 PM   Specimen: In/Out Cath Urine  Result Value Ref Range Status   Specimen Description   Final    IN/OUT CATH URINE Performed at [facility], [address] [place], [place]    Special Requests   Final    NONE Performed at [facility], [address] [place], [place]    Culture   Final    NO GROWTH Performed at [facility], [address] [place], [place]    Report Status 04/07/2022 FINAL  Final  Blood Culture (routine x 2)     Status: None (Preliminary result)   Collection Time: 04/05/22  7:28 PM   Specimen: Left Antecubital; Blood  Result Value Ref Range Status   Specimen Description LEFT ANTECUBITAL  Final   Special Requests   Final    BOTTLES DRAWN AEROBIC AND ANAEROBIC Blood Culture adequate volume   Culture   Final    NO GROWTH 2 DAYS Performed at [facility], [address], [place], [place]    Report Status PENDING  Incomplete  Blood Culture (routine x 2)     Status: None (Preliminary result)   Collection Time: 04/05/22  7:35 PM   Specimen: BLOOD LEFT FOREARM  Result Value Ref Range Status   Specimen Description BLOOD LEFT FOREARM  Final   Special Requests   Final    BOTTLES DRAWN AEROBIC AND ANAEROBIC Blood Culture adequate volume   Culture   Final    NO GROWTH 2 DAYS Performed at [facility], [address] [place], [place]    Report Status PENDING  Incomplete  MRSA Next Gen by PCR, Nasal     Status: Abnormal    Collection Time: 04/05/22  9:35 PM   Specimen: Nasal Mucosa; Nasal Swab  Result Value Ref Range Status   MRSA by PCR Next Gen DETECTED (A) NOT DETECTED Final    Comment: RESULT CALLED TO, READ BACK BY AND VERIFIED WITH: TEASLEY,B ON 04/06/2022 @ 0228 BY ACOSTA,A Performed at [facility], [address], [place], [place]     Radiology Studies: CT Head Wo Contrast  Result Date: 04/06/2022 CLINICAL DATA:  Delirium EXAM: CT HEAD WITHOUT CONTRAST TECHNIQUE: Contiguous axial images were obtained from the base of the skull through the vertex without intravenous contrast. RADIATION DOSE REDUCTION: This exam was performed according to the departmental dose-optimization program which includes automated exposure control, adjustment of the mA and/or kV according to patient size and/or use of iterative reconstruction technique. COMPARISON:  07/16/2021 FINDINGS: Brain: Normal anatomic configuration. Parenchymal volume loss is commensurate with the patient's age. Moderate, stable periventricular white matter changes are present likely reflecting the sequela of small vessel ischemia. Stable small chronic left occipital infarct again noted. No abnormal intra or extra-axial mass lesion or fluid collection. No abnormal mass effect or midline shift. No evidence of acute intracranial hemorrhage or infarct. Ventricular size is normal. Cerebellum unremarkable. Vascular: No asymmetric hyperdense vasculature at the skull base. Skull: Intact Sinuses/Orbits: Paranasal sinuses are clear. Orbits are unremarkable. Other: Mastoid air cells and middle ear cavities are clear. IMPRESSION: 1. No acute intracranial hemorrhage or infarct. 2. Stable senescent changes and remote left occipital infarct.  Electronically Signed   By: Helyn Numbers M.D.   On: 04/06/2022 00:04   CT ABDOMEN PELVIS WO CONTRAST  Result Date: 04/05/2022 CLINICAL DATA:  Flank pain, kidney stone suspected Sepsis EXAM: CT ABDOMEN AND PELVIS WITHOUT CONTRAST  TECHNIQUE: Multidetector CT imaging of the abdomen and pelvis was performed following the standard protocol without IV contrast. RADIATION DOSE REDUCTION: This exam was performed according to the departmental dose-optimization program which includes automated exposure control, adjustment of the mA and/or kV according to patient size and/or use of iterative reconstruction technique. COMPARISON:  07/16/2021 FINDINGS: Lower chest: No acute abnormality. Hepatobiliary: Prior cholecystectomy. Pneumobilia, stable since prior study. No biliary ductal dilatation. Pancreas: No focal abnormality or ductal dilatation. Spleen: No focal abnormality.  Normal size. Adrenals/Urinary Tract: Large bilateral renal cysts, stable since prior study. No follow-up imaging recommended. Bilateral renal stones, the largest on the right in the upper pole measuring 18 mm. The largest on the left in the lower pole measuring 10 mm. No ureteral stones or hydronephrosis. Adrenal glands unremarkable. A few bladder wall diverticula noted, unchanged since prior study. Mild bladder wall thickening is stable since prior study, likely related to chronic bladder outlet obstruction. Stomach/Bowel: Normal appendix. Stomach, large and small bowel grossly unremarkable. Vascular/Lymphatic: Aortic atherosclerosis. No evidence of aneurysm or adenopathy. Reproductive: Prostate enlargement. Other: No free fluid or free air. Musculoskeletal: Sclerotic area within the left iliac bone is stable, likely bone island. Degenerative changes in the lumbar spine. IMPRESSION: Bilateral nephrolithiasis.  No ureteral stones or hydronephrosis. Numerous bilateral renal cysts are stable since prior study. Prostate enlargement. Mild bladder wall thickening and several bladder wall diverticula, unchanged. No acute findings. Electronically Signed   By: Charlett Nose M.D.   On: 04/05/2022 23:55   DG Chest Port 1 View  Result Date: 04/05/2022 CLINICAL DATA:  Sepsis EXAM: PORTABLE  CHEST 1 VIEW COMPARISON:  07/16/2021 FINDINGS: 2 frontal views of the chest demonstrate an unremarkable cardiac silhouette. Chronic scarring throughout the lungs without focal airspace disease, effusion, or pneumothorax. No acute bony abnormalities. Bilateral renal calculi are identified, measuring up to 2.1 cm on the right and 0.8 cm on the left. IMPRESSION: 1. No acute intrathoracic process.  Stable scarring. 2. Continued bilateral renal calculi as above. Electronically Signed   By: Sharlet Salina M.D.   On: 04/05/2022 20:16    Scheduled Meds:  Chlorhexidine Gluconate Cloth  6 each Topical Daily   cyanocobalamin  1,000 mcg Intramuscular Q30 days   feeding supplement  237 mL Oral TID   heparin  5,000 Units Subcutaneous Q8H   levothyroxine  25 mcg Oral QAC breakfast   memantine  10 mg Oral BID   mupirocin ointment  1 Application Nasal BID   mouth rinse  15 mL Mouth Rinse 4 times per day   Continuous Infusions:  ceFEPime (MAXIPIME) IV 2 g (04/06/22 2034)   lactated ringers 125 mL/hr at 04/07/22 0949   vancomycin      LOS: 1 day   Shon Hale M.D on 04/07/2022 at 11:45 AM  Go to [email or web address] - for contact info  [facility] - Office  [phone]  If 7PM-7AM, please contact night-coverage [email or web address] 04/07/2022, 11:45 AM

## 2022-04-07 NOTE — Progress Notes (Signed)
Pharmacy Antibiotic Note  Robert Hurst is a 88 y.o. male admitted on 04/05/2022 with sepsis.  Pharmacy has been consulted for vancomycin and cefepime dosing. Pt presented from [facility] with AMS and emesis x1 this morning. Pharmacy consulted for vanc and cefepime dosing for suspected sepsis. Lactic acid 4.5 on admit. WBC is 7. Pt afebrile. Pt has noted penicillin allergy with unknown reaction history. Spoke with MD who prefers cefepime to Zosyn.  Cr is improved 1.8 > 1.4.    Plan: Vancomycin 1250mg  IV q48hrs Calculated AUC: 499, SCr used 1.4, Predicted Css min 9.5 Cefepime 2g every 24 hours Follow clinical progress and deescalate as indicated Monitor renal fxn, SCr  Temp (24hrs), Avg:97.9 F (36.6 C), Min:97.2 F (36.2 C), Max:98.7 F (37.1 C)  Recent Labs  Lab 04/05/22 1928 04/05/22 1953 04/05/22 2052 04/06/22 0543 04/06/22 0649 04/07/22 0401 04/07/22 0542  WBC 7.0  --   --  4.9  --  3.6*  --   CREATININE 1.89* 1.80*  --  1.53*  --   --  1.40*  LATICACIDVEN 4.5*  --  3.1* 1.3 2.1*  --   --      Estimated Creatinine Clearance: 29.2 mL/min (A) (by C-G formula based on SCr of 1.4 mg/dL (H)).    Allergies  Allergen Reactions   Penicillins Other (See Comments)    Unknown reaction   Antimicrobials this admission: Cefepime 7/14 >>  Vanc 7/14 >>   Dose adjustments this admission: none  Microbiology results: 7/14 BCx: IP 7/14 UCx: IP  7/14 MRSA PCR: IP  Thank you for allowing pharmacy to participate in this patient's care.  8/14, PharmD Clinical Pharmacist 04/07/2022 7:53 AM Check [email or web address] for unit specific pharmacy number

## 2022-04-08 DIAGNOSIS — N179 Acute kidney failure, unspecified: Secondary | ICD-10-CM | POA: Diagnosis not present

## 2022-04-08 DIAGNOSIS — R410 Disorientation, unspecified: Secondary | ICD-10-CM | POA: Diagnosis not present

## 2022-04-08 DIAGNOSIS — G9341 Metabolic encephalopathy: Secondary | ICD-10-CM | POA: Diagnosis not present

## 2022-04-08 DIAGNOSIS — N189 Chronic kidney disease, unspecified: Secondary | ICD-10-CM | POA: Diagnosis not present

## 2022-04-08 LAB — BASIC METABOLIC PANEL
Anion gap: 5 (ref 5–15)
BUN: 13 mg/dL (ref 8–23)
CO2: 25 mmol/L (ref 22–32)
Calcium: 8.3 mg/dL — ABNORMAL LOW (ref 8.9–10.3)
Chloride: 111 mmol/L (ref 98–111)
Creatinine, Ser: 1.35 mg/dL — ABNORMAL HIGH (ref 0.61–1.24)
GFR, Estimated: 50 mL/min — ABNORMAL LOW (ref >60)
Glucose, Bld: 106 mg/dL — ABNORMAL HIGH (ref 70–99)
Potassium: 3.7 mmol/L (ref 3.5–5.1)
Sodium: 141 mmol/L (ref 135–145)

## 2022-04-08 LAB — CBC
HCT: 38.8 % — ABNORMAL LOW (ref 39.0–52.0)
Hemoglobin: 12.7 g/dL — ABNORMAL LOW (ref 13.0–17.0)
MCH: 29.3 pg (ref 26.0–34.0)
MCHC: 32.7 g/dL (ref 30.0–36.0)
MCV: 89.6 fL (ref 80.0–100.0)
Platelets: 130 10*3/uL — ABNORMAL LOW (ref 150–400)
RBC: 4.33 MIL/uL (ref 4.22–5.81)
RDW: 14.5 % (ref 11.5–15.5)
WBC: 3.5 10*3/uL — ABNORMAL LOW (ref 4.0–10.5)
nRBC: 0 % (ref 0.0–0.2)

## 2022-04-08 LAB — LACTIC ACID, PLASMA: Lactic Acid, Venous: 1.1 mmol/L (ref 0.5–1.9)

## 2022-04-08 NOTE — Progress Notes (Signed)
PROGRESS NOTE     Robert Hurst, is a 88 y.o. male, DOB - July 16, 1932, [id number]  Admit date - 04/05/2022   Admitting Physician Terrian Ridlon Mariea Clonts, MD  Outpatient Primary MD for the patient is [name], MD  LOS - 2  Chief Complaint  Patient presents with   Altered Mental Status       Brief Summary:- - 88 y.o. male with medical history significant of Alzheimer's disease, hypothyroidism, prior DVT not on anticoagulation, CKD 3B--- admitted from [facility] on 04/06/2022 with acute metabolic encephalopathy superimposed on advanced dementia and concerns about possible infectious source    -Assessment and Plan:   A/p 1)Acute metabolic encephalopathy superimposed on advanced dementia---  Due to AKI superimposed on CKD in the setting of dehydration resulting in hypernatremia -Given IV antibiotics and IV fluids -Mentation improving with improvement in sodium and renal function   2)AKI----acute kidney injury on CKD stage - 3B   creatinine on admission= 1.89 ,  baseline creatinine = 1.3 to 1.4    ,  creatinine is now= 135 -- renally adjust medications, avoid nephrotoxic agents / dehydration  / hypotension -Continue IV fluids   3)Hypernatremia--- due to dehydration in setting of poor oral intake -Sodium improved to 141 from 147 with IV fluids -Mentation improving with improvement in sodium and renal function   4)Lactic Acidosis--- most likely due to dehydration/poor perfusion -Need to rule out infection -Lactic acidosis resolved with hydration Consider discontinuation of Vanco and cefepime on 04/09/2022 if culture data remains negative  5)Nutrition--- oral intake is not great, change IV fluids to D5 half-normal -Continue nutritional supplements  6)Dementia--- stable, continue Namenda  7)Hypothyroidism--- continue levothyroxine  8)Pancytopenia-----No bleeding concerns, continue to monitor  Disposition/Need for in-Hospital Stay- patient unable to be discharged at  this time due to Acute Metabolic Encephalopathy and Dehydration requiring IVF and iv antibiotics pending culture data --Mentation improving with improvement in sodium and renal function -Possible discharge back to SNF on 04/09/2020  Status is: Inpatient   Disposition: The patient is from: SNF              Anticipated d/c is to: SNF              Anticipated d/c date is: 1 day              Patient currently is not medically stable to d/c. Barriers: Not Clinically Stable-   Code Status :  -  Code Status: DNR   Family Communication:   Daughters [name] and [place]  DVT Prophylaxis  :   - SCDs   heparin injection 5,000 Units Start: 04/06/22 0700 SCDs Start: 04/06/22 4259   Lab Results  Component Value Date   PLT 130 (L) 04/08/2022   Inpatient Medications  Scheduled Meds:  Chlorhexidine Gluconate Cloth  6 each Topical Daily   cyanocobalamin  1,000 mcg Intramuscular Q30 days   feeding supplement  237 mL Oral TID   heparin  5,000 Units Subcutaneous Q8H   levothyroxine  25 mcg Oral QAC breakfast   memantine  10 mg Oral BID   mupirocin ointment  1 Application Nasal BID   mouth rinse  15 mL Mouth Rinse 4 times per day   Continuous Infusions:  ceFEPime (MAXIPIME) IV 2 g (04/07/22 2025)   dextrose 5 % and 0.45% NaCl 100 mL/hr at 04/08/22 1819   vancomycin 1,250 mg (04/07/22 2200)   PRN Meds:.acetaminophen, mouth rinse   Anti-infectives (From admission, onward)    Start  Dose/Rate Route Frequency Ordered Stop   04/07/22 2100  vancomycin (VANCOREADY) IVPB 1250 mg/250 mL        1,250 mg 166.7 mL/hr over 90 Minutes Intravenous Every 48 hours 04/07/22 0753     04/06/22 2130  ceFEPIme (MAXIPIME) 2 g in sodium chloride 0.9 % 100 mL IVPB        2 g 200 mL/hr over 30 Minutes Intravenous Every 24 hours 04/05/22 2137     04/06/22 0100  cefTRIAXone (ROCEPHIN) 2 g in sodium chloride 0.9 % 100 mL IVPB        2 g 200 mL/hr over 30 Minutes Intravenous  Once 04/06/22 0058 04/06/22 0549    04/05/22 2130  vancomycin (VANCOCIN) IVPB 1000 mg/200 mL premix        1,000 mg 200 mL/hr over 60 Minutes Intravenous  Once 04/05/22 2126 04/05/22 2304   04/05/22 2130  ceFEPIme (MAXIPIME) 2 g in sodium chloride 0.9 % 100 mL IVPB  Status:  Discontinued        2 g 200 mL/hr over 30 Minutes Intravenous  Once 04/05/22 2126 04/05/22 2137       Subjective: Robert Hurst today has no fevers, no emesis,  No chest pain,  Cooperative, oral intake improved  Objective: Vitals:   04/08/22 0414 04/08/22 0808 04/08/22 1351 04/08/22 2051  BP: 136/90 (!) 145/80 (!) 127/55 116/65  Pulse: (!) 53 (!) 51 (!) 51 (!) 46  Resp: 16 16 17 16   Temp: 98 F (36.7 C)  98 F (36.7 C) 97.6 F (36.4 C)  TempSrc: Oral   Oral  SpO2: 100% 100% 100% 99%  Weight:      Height:        Intake/Output Summary (Last 24 hours) at 04/08/2022 2101 Last data filed at 04/08/2022 1836 Gross per 24 hour  Intake 1270 ml  Output 1200 ml  Net 70 ml   Filed Weights   04/06/22 1026 04/07/22 0500  Weight: 56.1 kg 56.6 kg    Physical Exam  Gen:- resting,in no apparent distress --Mentation improving   HEENT:- [place].AT, No sclera icterus Neck-Supple Neck,No JVD,.  Lungs- fair symmetrical air movement, no wheezing CV- S1, S2 normal, regular  Abd-  +ve B.Sounds, Abd Soft, No tenderness,    Extremity/Skin:- No  edema, pedal pulses present  Psych-affect is appropriate, cognitive and memory deficits noted Neuro-generalized weakness, no new focal deficits, no tremors  Data Reviewed: I have personally reviewed following labs and imaging studies  CBC: Recent Labs  Lab 04/05/22 1928 04/05/22 1953 04/06/22 0543 04/07/22 0401 04/08/22 0509  WBC 7.0  --  4.9 3.6* 3.5*  NEUTROABS 4.9  --   --   --   --   HGB 16.7 18.0* 13.0 12.3* 12.7*  HCT 50.7 53.0* 38.6* 37.0* 38.8*  MCV 89.4  --  89.1 89.6 89.6  PLT 184  --  132* PLATELETS APPEAR DECREASED [id number]   Basic Metabolic Panel: Recent Labs  Lab 04/05/22 1928 04/05/22 1953  04/06/22 0543 04/07/22 0542 04/08/22 0509  NA 146* 147* 143 143 141  K 4.7 4.7 4.4 4.3 3.7  CL 109 108 112* 112* 111  CO2 26  --  24 26 25   GLUCOSE 113* 108* 84 70 106*  BUN 17 19 14 17 13   CREATININE 1.89* 1.80* 1.53* 1.40* 1.35*  CALCIUM 9.7  --  8.7* 8.6* 8.3*  MG  --   --  1.8  --   --   PHOS  --   --  2.8  --   --    GFR: Estimated Creatinine Clearance: 30.3 mL/min (A) (by C-G formula based on SCr of 1.35 mg/dL (H)). Liver Function Tests: Recent Labs  Lab 04/05/22 1928 04/06/22 0543  AST 19 13*  ALT 11 7  ALKPHOS 86 59  BILITOT 0.8 0.3  PROT 7.8 5.5*  ALBUMIN 3.9 2.7*   Cardiac Enzymes: No results for input(s): "CKTOTAL", "CKMB", "CKMBINDEX", "TROPONINI" in the last 168 hours. BNP (last 3 results) No results for input(s): "PROBNP" in the last 8760 hours. HbA1C: No results for input(s): "HGBA1C" in the last 72 hours. Sepsis Labs: @LABRCNTIP (procalcitonin:4,lacticidven:4) ) Recent Results (from the past 240 hour(s))  Urine Culture     Status: None   Collection Time: 04/05/22  7:08 PM   Specimen: In/Out Cath Urine  Result Value Ref Range Status   Specimen Description   Final    IN/OUT CATH URINE Performed at [facility], [address], [place], [place]    Special Requests   Final    NONE Performed at [facility], [address] [place], [place]    Culture   Final    NO GROWTH Performed at [facility], [address] [place], [place]    Report Status 04/07/2022 FINAL  Final  Blood Culture (routine x 2)     Status: None (Preliminary result)   Collection Time: 04/05/22  7:28 PM   Specimen: Left Antecubital; Blood  Result Value Ref Range Status   Specimen Description LEFT ANTECUBITAL  Final   Special Requests   Final    BOTTLES DRAWN AEROBIC AND ANAEROBIC Blood Culture adequate volume   Culture   Final    NO GROWTH 3 DAYS Performed at [facility], [address] [place], [place]    Report Status  PENDING  Incomplete  Blood Culture (routine x 2)     Status: None (Preliminary result)   Collection Time: 04/05/22  7:35 PM   Specimen: BLOOD LEFT FOREARM  Result Value Ref Range Status   Specimen Description BLOOD LEFT FOREARM  Final   Special Requests   Final    BOTTLES DRAWN AEROBIC AND ANAEROBIC Blood Culture adequate volume   Culture   Final    NO GROWTH 3 DAYS Performed at [facility], [address], [place], [place]    Report Status PENDING  Incomplete  MRSA Next Gen by PCR, Nasal     Status: Abnormal   Collection Time: 04/05/22  9:35 PM   Specimen: Nasal Mucosa; Nasal Swab  Result Value Ref Range Status   MRSA by PCR Next Gen DETECTED (A) NOT DETECTED Final    Comment: RESULT CALLED TO, READ BACK BY AND VERIFIED WITH: TEASLEY,B ON 04/06/2022 @ 0228 BY ACOSTA,A Performed at [facility], [address], [place], [place]     Radiology Studies: No results found.  Scheduled Meds:  Chlorhexidine Gluconate Cloth  6 each Topical Daily   cyanocobalamin  1,000 mcg Intramuscular Q30 days   feeding supplement  237 mL Oral TID   heparin  5,000 Units Subcutaneous Q8H   levothyroxine  25 mcg Oral QAC breakfast   memantine  10 mg Oral BID   mupirocin ointment  1 Application Nasal BID   mouth rinse  15 mL Mouth Rinse 4 times per day   Continuous Infusions:  ceFEPime (MAXIPIME) IV 2 g (04/07/22 2025)   dextrose 5 % and 0.45% NaCl 100 mL/hr at 04/08/22 1819  vancomycin 1,250 mg (04/07/22 2200)    LOS: 2 days   Roxan Hockey M.D on 04/08/2022 at 9:01 PM  Go to [email or web address] - for contact info  [facility] - Office  [phone]  If 7PM-7AM, please contact night-coverage [email or web address] 04/08/2022, 9:01 PM

## 2022-04-08 NOTE — TOC Initial Note (Signed)
Transition of Care [facility]) - Initial/Assessment Note    Patient Details  Name: Robert Hurst MRN: [id number] Date of Birth: May 20, 1932  Transition of Care [facility]) CM/SW Contact:    Elliot Gault, LCSW Phone Number: 04/08/2022, 10:44 AM  Clinical Narrative:                  Pt admitted from [facility] long term care. Reviewed pt's record and discussed status with MD in progression rounds. Per MD, pt may be stable for dc tomorrow. Updated Debbie at [facility].   TOC will follow up in AM to further assist.  Expected Discharge Plan: Long Term Nursing Home Barriers to Discharge: Continued Medical Work up   Patient Goals and CMS Choice Patient states their goals for this hospitalization and ongoing recovery are:: return home      Expected Discharge Plan and Services Expected Discharge Plan: Long Term Nursing Home In-house Referral: Clinical Social Work     Living arrangements for the past 2 months: Skilled Nursing Facility                                      Prior Living Arrangements/Services Living arrangements for the past 2 months: Skilled Nursing Facility Lives with:: Facility Resident Patient language and need for interpreter reviewed:: No Do you feel safe going back to the place where you live?: Yes      Need for Family Participation in Patient Care: No (Comment) Care giver support system in place?: Yes (comment)   Criminal Activity/Legal Involvement Pertinent to Current Situation/Hospitalization: No - Comment as needed  Activities of Daily Living Home Assistive Devices/Equipment: Wheelchair ADL Screening (condition at time of admission) Patient's cognitive ability adequate to safely complete daily activities?: No Is the patient deaf or have difficulty hearing?: No Does the patient have difficulty seeing, even when wearing glasses/contacts?: No Does the patient have difficulty concentrating, remembering, or making decisions?: Yes Patient able to express  need for assistance with ADLs?: No Does the patient have difficulty dressing or bathing?: Yes Independently performs ADLs?: No Communication: Needs assistance Is this a change from baseline?: Pre-admission baseline Dressing (OT): Dependent Is this a change from baseline?: Pre-admission baseline Grooming: Dependent Is this a change from baseline?: Pre-admission baseline Feeding: Independent Bathing: Dependent Is this a change from baseline?: Pre-admission baseline Toileting: Dependent Is this a change from baseline?: Pre-admission baseline In/Out Bed: Dependent Is this a change from baseline?: Pre-admission baseline Walks in Home: Dependent Is this a change from baseline?: Pre-admission baseline Does the patient have difficulty walking or climbing stairs?: Yes Weakness of Legs: Both Weakness of Arms/Hands: Both  Permission Sought/Granted                  Emotional Assessment       Orientation: : Oriented to Self Alcohol / Substance Use: Not Applicable Psych Involvement: No (comment)  Admission diagnosis:  Dehydration [E86.0] Altered mental status [R41.82] Altered mental status, unspecified altered mental status type [R41.82] Acute metabolic encephalopathy [G93.41] Patient Active Problem List   Diagnosis Date Noted   Altered mental status 04/06/2022   Dehydration 04/06/2022   Pain due to onychomycosis of toenails of both feet 10/09/2021   Elevated lactic acid level    Episode of unresponsiveness 07/16/2021   Severe sepsis (HCC) 02/10/2021   COVID-19    Advanced care planning/counseling discussion    Palliative care by specialist    Sepsis due  to undetermined organism (HCC) 05/22/2020   Hypotension due to hypovolemia    Thrombocytopenia (HCC) 05/03/2020   Sepsis due to Enterococcus (HCC) 05/03/2020   Acute on chronic renal insufficiency    UTI (urinary tract infection) 04/30/2020   CKD (chronic kidney disease), stage III (HCC) 02/12/2020   Acquired  hypothyroidism 02/12/2020   History of DVT (deep vein thrombosis)    History of COVID-19    Acute cystitis without hematuria    Goals of care, counseling/discussion    Palliative care encounter    Acute metabolic encephalopathy 08/11/2019   Syncope and collapse 08/11/2019   Syncope    Alzheimer's disease (HCC) 08/08/2019   Acute kidney injury superimposed on chronic kidney disease (HCC) 08/08/2019   Lactic acidosis 08/08/2019   Incontinence 08/08/2019   PCP:  [name], MD Pharmacy:   [address] [place], [place] - [address] [place] [address] Phone: [phone] Fax: [phone]  [facility] [place], [place] - [address] [place] [address] Phone: [phone] Fax: [phone]     Social Determinants of Health (SDOH) Interventions    Readmission Risk Interventions    07/18/2021    3:10 PM 06/25/2021   10:25 AM 02/12/2021    1:36 PM  Readmission Risk Prevention Plan  Medication Screening  Complete   Transportation Screening Complete Complete Complete  PCP or Specialist Appt within 5-7 Days Complete  Complete  Home Care Screening Complete  Complete  Medication Review (RN CM) Complete  Complete

## 2022-04-08 NOTE — NC FL2 (Signed)
[place] MEDICAID FL2 LEVEL OF CARE SCREENING TOOL     IDENTIFICATION  Patient Name: Robert Hurst Birthdate: 12-27-1931 Sex: male Admission Date (Current Location): 04/05/2022  [facility] and [place] Number:  [employer] and Address:  [facility],  [address] [place] [address]      Provider Number: [id number]  Attending Physician Name and Address:  Shon Hale, MD  Relative Name and Phone Number:       Current Level of Care: Hospital Recommended Level of Care: Skilled Nursing Facility Prior Approval Number:    Date Approved/Denied:   PASRR Number:    Discharge Plan: SNF    Current Diagnoses: Patient Active Problem List   Diagnosis Date Noted   Altered mental status 04/06/2022   Dehydration 04/06/2022   Pain due to onychomycosis of toenails of both feet 10/09/2021   Elevated lactic acid level    Episode of unresponsiveness 07/16/2021   Severe sepsis (HCC) 02/10/2021   COVID-19    Advanced care planning/counseling discussion    Palliative care by specialist    Sepsis due to undetermined organism (HCC) 05/22/2020   Hypotension due to hypovolemia    Thrombocytopenia (HCC) 05/03/2020   Sepsis due to Enterococcus (HCC) 05/03/2020   Acute on chronic renal insufficiency    UTI (urinary tract infection) 04/30/2020   CKD (chronic kidney disease), stage III (HCC) 02/12/2020   Acquired hypothyroidism 02/12/2020   History of DVT (deep vein thrombosis)    History of COVID-19    Acute cystitis without hematuria    Goals of care, counseling/discussion    Palliative care encounter    Acute metabolic encephalopathy 08/11/2019   Syncope and collapse 08/11/2019   Syncope    Alzheimer's disease (HCC) 08/08/2019   Acute kidney injury superimposed on chronic kidney disease (HCC) 08/08/2019   Lactic acidosis 08/08/2019   Incontinence 08/08/2019    Orientation RESPIRATION BLADDER Height & Weight     Self  O2 (PRN) Incontinent Weight: 124 lb 12.5  oz (56.6 kg) Height:  5\' 5"  (165.1 cm)  BEHAVIORAL SYMPTOMS/MOOD NEUROLOGICAL BOWEL NUTRITION STATUS      Incontinent Diet (mechanical soft texture, thin liquids, with house supplement)  AMBULATORY STATUS COMMUNICATION OF NEEDS Skin   Extensive Assist Verbally Normal                       Personal Care Assistance Level of Assistance  Bathing, Feeding, Dressing Bathing Assistance: Maximum assistance Feeding assistance: Limited assistance Dressing Assistance: Maximum assistance     Functional Limitations Info  Sight, Hearing, Speech Sight Info: Adequate Hearing Info: Adequate Speech Info: Adequate    SPECIAL CARE FACTORS FREQUENCY                       Contractures Contractures Info: Not present    Additional Factors Info  Code Status, Allergies Code Status Info: DNR Allergies Info: Penicillins           Current Medications (04/08/2022):  This is the current hospital active medication list Current Facility-Administered Medications  Medication Dose Route Frequency Provider Last Rate Last Admin   acetaminophen (TYLENOL) tablet 650 mg  650 mg Oral Q6H PRN Adefeso, Oladapo, DO       ceFEPIme (MAXIPIME) 2 g in sodium chloride 0.9 % 100 mL IVPB  2 g Intravenous Q24H Adefeso, Oladapo, DO 200 mL/hr at 04/07/22 2025 2 g at 04/07/22 2025   Chlorhexidine Gluconate Cloth 2 % PADS 6 each  6 each Topical Daily Shon Hale, MD   6 each at 04/08/22 0818   cyanocobalamin ((VITAMIN B-12)) injection 1,000 mcg  1,000 mcg Intramuscular Q30 days Adefeso, Oladapo, DO   1,000 mcg at 04/06/22 0947   dextrose 5 %-0.45 % sodium chloride infusion   Intravenous Continuous Emokpae, Courage, MD 100 mL/hr at 04/07/22 1210 Rate Change at 04/07/22 1210   feeding supplement (ENSURE ENLIVE / ENSURE PLUS) liquid 237 mL  237 mL Oral TID Adefeso, Oladapo, DO   237 mL at 04/08/22 0818   heparin injection 5,000 Units  5,000 Units Subcutaneous Q8H Adefeso, Oladapo, DO   5,000 Units at 04/08/22 0448    levothyroxine (SYNTHROID) tablet 25 mcg  25 mcg Oral QAC breakfast Adefeso, Oladapo, DO   25 mcg at 04/08/22 0816   memantine (NAMENDA) tablet 10 mg  10 mg Oral BID Adefeso, Oladapo, DO   10 mg at 04/08/22 0816   mupirocin ointment (BACTROBAN) 2 % 1 Application  1 Application Nasal BID Shon Hale, MD   1 Application at 04/08/22 0819   Oral care mouth rinse  15 mL Mouth Rinse PRN Emokpae, Courage, MD       Oral care mouth rinse  15 mL Mouth Rinse 4 times per day Shon Hale, MD   15 mL at 04/08/22 0819   vancomycin (VANCOREADY) IVPB 1250 mg/250 mL  1,250 mg Intravenous Q48H Hall, Scott A, RPH 166.7 mL/hr at 04/07/22 2200 1,250 mg at 04/07/22 2200     Discharge Medications: Please see discharge summary for a list of discharge medications.  Relevant Imaging Results:  Relevant Lab Results:   Additional Information continue palliative care  Elliot Gault, LCSW

## 2022-04-09 DIAGNOSIS — E860 Dehydration: Secondary | ICD-10-CM | POA: Diagnosis not present

## 2022-04-09 DIAGNOSIS — G309 Alzheimer's disease, unspecified: Secondary | ICD-10-CM | POA: Diagnosis not present

## 2022-04-09 DIAGNOSIS — N179 Acute kidney failure, unspecified: Secondary | ICD-10-CM | POA: Diagnosis not present

## 2022-04-09 DIAGNOSIS — R410 Disorientation, unspecified: Secondary | ICD-10-CM | POA: Diagnosis not present

## 2022-04-09 LAB — CBC
HCT: 38.6 % — ABNORMAL LOW (ref 39.0–52.0)
Hemoglobin: 13 g/dL (ref 13.0–17.0)
MCH: 30 pg (ref 26.0–34.0)
MCHC: 33.7 g/dL (ref 30.0–36.0)
MCV: 89.1 fL (ref 80.0–100.0)
Platelets: 132 10*3/uL — ABNORMAL LOW (ref 150–400)
RBC: 4.33 MIL/uL (ref 4.22–5.81)
RDW: 14.9 % (ref 11.5–15.5)
WBC: 4.9 10*3/uL (ref 4.0–10.5)
nRBC: 0 % (ref 0.0–0.2)

## 2022-04-09 MED ORDER — CYANOCOBALAMIN 1000 MCG/ML IJ SOLN: 1000.0000 ug | INTRAMUSCULAR | 3 refills | Status: AC

## 2022-04-09 MED ORDER — ENSURE ENLIVE PO LIQD: 237.0000 mL | Freq: Three times a day (TID) | ORAL | 12 refills | Status: AC

## 2022-04-09 NOTE — Discharge Instructions (Signed)
1)Give B12 injection Monthly around the 15 th of Every Month  2)Repeat CBC and BMP blood test around Monday 04/15/22 3)Patient needs Help with feeding/Meals

## 2022-04-09 NOTE — Care Management Important Message (Signed)
Important Message  Patient Details  Name: Robert Hurst MRN: [id number] Date of Birth: 1932-05-19   Medicare Important Message Given:  Yes (copy mailed to daughter Lucienne Capers at [address], [place], [place] [address])     Corey Harold 04/09/2022, 9:43 AM

## 2022-04-09 NOTE — Discharge Summary (Signed)
Robert Hurst, is a 88 y.o. male  DOB 01/03/1932  MRN [id number].  Admission date:  04/05/2022  Admitting Physician  Yoniel Arkwright Mariea ClontsEmokpae, MD  Discharge Date:  04/09/2022   Primary MD  Toma DeitersHasanaj, Xaje A, MD  Recommendations for primary care physician for things to follow:   1)Give B12 injection Monthly around the 15 th of Every Month  2)Repeat CBC and BMP blood test around Monday 04/15/22 3)Patient needs Help with feeding/Meals  Admission Diagnosis  Dehydration [E86.0] Altered mental status [R41.82] Altered mental status, unspecified altered mental status type [R41.82] Acute metabolic encephalopathy [G93.41]   Discharge Diagnosis  Dehydration [E86.0] Altered mental status [R41.82] Altered mental status, unspecified altered mental status type [R41.82] Acute metabolic encephalopathy [G93.41]    Principal Problem:   Altered mental status Active Problems:   Alzheimer's disease (HCC)   Acute kidney injury superimposed on chronic kidney disease (HCC)   Lactic acidosis   Acute metabolic encephalopathy   Acquired hypothyroidism   Dehydration      Past Medical History:  Diagnosis Date   Alzheimer disease (HCC)    Calculus of bile duct (any) with acute cholecystitis    Cognitive communication deficit    COVID-19    Dementia (HCC)    DVT (deep venous thrombosis) (HCC)    Dysphagia    Malnourished (HCC)    Metabolic encephalopathy    Protein calorie malnutrition (HCC)    Renal disorder    Syncope and collapse    Thyroid disease     Past Surgical History:  Procedure Laterality Date   APPENDECTOMY     BALLOON DILATION N/A 01/15/2018   Procedure: BALLOON DILATION;  Surgeon: Malissa Hippoehman, Najeeb U, MD;  [facility];  Service: Endoscopy;  Laterality: N/A;   ERCP N/A 01/15/2018   Procedure: ENDOSCOPIC RETROGRADE CHOLANGIOPANCREATOGRAPHY (ERCP);  Surgeon: Malissa Hippoehman, Najeeb U, MD;  [facility];  Service: Endoscopy;  Laterality: N/A;   REMOVAL OF STONES N/A 01/15/2018   Procedure: REMOVAL OF STONES;  Surgeon: Malissa Hippoehman, Najeeb U, MD;  [facility];  Service: Endoscopy;  Laterality: N/A;   SPHINCTEROTOMY N/A 01/15/2018   Procedure: SPHINCTEROTOMY;  Surgeon: Malissa Hippoehman, Najeeb U, MD;  [facility];  Service: Endoscopy;  Laterality: N/A;   TONSILLECTOMY       HPI  from the history and physical done on the day of admission:   HPI: Robert CarbonJoe Hurst is a 88 y.o. male with medical history significant of Alzheimer's disease, hypothyroidism, prior DVT not on anticoagulation, CKD 3 who [facility] the emergency department [facility] due to concern for altered mental status.  Patient was unable to provide history, history was obtained from ED physician and ED medical record.  Per report, patient was reported to have an altered mental status which started yesterday whereby he was not talking but just moaning (at baseline, patient was usually a talkative).  No further history could be obtained at this time due to patient's current status      ED  Course:  In the emergency department, he was bradycardic, BP was in hypotensive range on arrival (73/51), other vital signs were within normal range.  Work-up in the ED showed normal CBC, hyponatremia, creatinine 1.89 (baseline creatinine at 1.3-1.4).  Lactic acid 4.5 > 3.1, urinalysis was unimpressive for UTI. CT head without contrast showed no acute intracranial hemorrhage or infarct CT abdomen and pelvis without contrast showed bilateral nephrolithiasis Chest x-ray showed no acute necrotic process. Patient was empirically treated with IV vancomycin and ceftriaxone due to GI and suspicion for sepsis.  IV hydration was provided.  Hospitalist was asked to admit patient for further evaluation and management.   Review of Systems: Review of systems as noted in the HPI. All other systems reviewed and are negative.      Hospital Course:   Brief Summary:- - 88 y.o. male with medical history significant of Alzheimer's disease, hypothyroidism, prior DVT not on anticoagulation, CKD 3B--- [facility] on 04/06/2022 with acute metabolic encephalopathy superimposed on advanced dementia and concerns about possible infectious source     -Assessment and Plan:   A/p 1)Acute metabolic encephalopathy superimposed on advanced dementia---  Due to AKI superimposed on CKD in the setting of dehydration resulting in hypernatremia Treated with IV antibiotics and IV fluids -Mentation improved significantly with improvement in sodium and renal function   2)AKI----acute kidney injury on CKD stage - 3B   creatinine on admission= 1.89 ,  baseline creatinine = 1.3 to 1.4    ,  creatinine is now= 135 -- renally adjust medications, avoid nephrotoxic agents / dehydration  / hypotension -Continue IV fluids   3)Hypernatremia--- due to dehydration in setting of poor oral intake -Sodium improved to 141 from 147 with IV fluids -Mentation improved significantly with improvement in sodium and renal function   4)Lactic Acidosis--- most likely due to dehydration/poor perfusion Urine and blood cultures negative, chest x-ray and CT abdomen and pelvis without acute infectious findings, no evidence of acute infection at this time -Lactic acidosis resolved with hydration Treated with vancomycin and cefepime -No further antibiotics needed   5)Nutrition--- patient will need nutritional supplements and patient will need help with feeding, patient received IV fluids here in the hospital --Continue nutritional supplements   6)Dementia--- stable, continue Namenda   7)Hypothyroidism--- continue levothyroxine   8)Pancytopenia-----No bleeding concerns, continue B12 shots and repeat CBC within a week  Disposition:- dc to SNF    Disposition: The patient is from: SNF              Anticipated d/c is to: SNF  Discharge  Condition: stable  Follow UP--- with PCP at SNF facility  Diet and Activity recommendation:  As advised  Discharge Instructions    Discharge Instructions     Call MD for:  difficulty breathing, headache or visual disturbances   Complete by: As directed    Call MD for:  persistant dizziness or light-headedness   Complete by: As directed    Call MD for:  persistant nausea and vomiting   Complete by: As directed    Call MD for:  severe uncontrolled pain   Complete by: As directed    Call MD for:  temperature >100.4   Complete by: As directed    Diet general   Complete by: As directed    Patient needs Help with feeding/Meals   Discharge instructions   Complete by: As directed    1)Give B12 injection Monthly around the 15 th of Every Month  2)Repeat CBC  and BMP blood test around Monday 04/15/22 3)Patient needs Help with feeding/Meals   Increase activity slowly   Complete by: As directed          Discharge Medications     Allergies as of 04/09/2022       Reactions   Penicillins Other (See Comments)   Unknown reaction        Medication List     STOP taking these medications    potassium chloride 20 MEQ/15ML (10%) Soln   Potassium Chloride ER 20 MEQ Tbcr       TAKE these medications    acetaminophen 325 MG tablet Commonly known as: TYLENOL Take 2 tablets (650 mg total) by mouth every 6 (six) hours as needed for mild pain, fever or headache (fever >/= 101).   cyanocobalamin 1000 MCG/ML injection Commonly known as: (VITAMIN B-12) Inject 1 mL (1,000 mcg total) into the muscle every 30 (thirty) days. Give B12 injection Monthly around the 15 th of Every Month What changed: additional instructions   feeding supplement Liqd Take 237 mLs by mouth 3 (three) times daily.   levothyroxine 25 MCG tablet Commonly known as: SYNTHROID Take 1 tablet (25 mcg total) by mouth daily before breakfast.   memantine 10 MG tablet Commonly known as: NAMENDA Take 10 mg by  mouth 2 (two) times daily.   ondansetron 4 MG tablet Commonly known as: ZOFRAN Take 1 tablet (4 mg total) by mouth every 6 (six) hours as needed for nausea.   senna-docusate 8.6-50 MG tablet Commonly known as: Senokot-S Take 2 tablets by mouth at bedtime.        Major procedures and Radiology Reports - PLEASE review detailed and final reports for all details, in brief -   CT Head Wo Contrast  Result Date: 04/06/2022 CLINICAL DATA:  Delirium EXAM: CT HEAD WITHOUT CONTRAST TECHNIQUE: Contiguous axial images were obtained from the base of the skull through the vertex without intravenous contrast. RADIATION DOSE REDUCTION: This exam was performed according to the departmental dose-optimization program which includes automated exposure control, adjustment of the mA and/or kV according to patient size and/or use of iterative reconstruction technique. COMPARISON:  07/16/2021 FINDINGS: Brain: Normal anatomic configuration. Parenchymal volume loss is commensurate with the patient's age. Moderate, stable periventricular white matter changes are present likely reflecting the sequela of small vessel ischemia. Stable small chronic left occipital infarct again noted. No abnormal intra or extra-axial mass lesion or fluid collection. No abnormal mass effect or midline shift. No evidence of acute intracranial hemorrhage or infarct. Ventricular size is normal. Cerebellum unremarkable. Vascular: No asymmetric hyperdense vasculature at the skull base. Skull: Intact Sinuses/Orbits: Paranasal sinuses are clear. Orbits are unremarkable. Other: Mastoid air cells and middle ear cavities are clear. IMPRESSION: 1. No acute intracranial hemorrhage or infarct. 2. Stable senescent changes and remote left occipital infarct. Electronically Signed   By: Helyn Numbers M.D.   On: 04/06/2022 00:04   CT ABDOMEN PELVIS WO CONTRAST  Result Date: 04/05/2022 CLINICAL DATA:  Flank pain, kidney stone suspected Sepsis EXAM: CT ABDOMEN  AND PELVIS WITHOUT CONTRAST TECHNIQUE: Multidetector CT imaging of the abdomen and pelvis was performed following the standard protocol without IV contrast. RADIATION DOSE REDUCTION: This exam was performed according to the departmental dose-optimization program which includes automated exposure control, adjustment of the mA and/or kV according to patient size and/or use of iterative reconstruction technique. COMPARISON:  07/16/2021 FINDINGS: Lower chest: No acute abnormality. Hepatobiliary: Prior cholecystectomy. Pneumobilia, stable since prior study. No biliary  ductal dilatation. Pancreas: No focal abnormality or ductal dilatation. Spleen: No focal abnormality.  Normal size. Adrenals/Urinary Tract: Large bilateral renal cysts, stable since prior study. No follow-up imaging recommended. Bilateral renal stones, the largest on the right in the upper pole measuring 18 mm. The largest on the left in the lower pole measuring 10 mm. No ureteral stones or hydronephrosis. Adrenal glands unremarkable. A few bladder wall diverticula noted, unchanged since prior study. Mild bladder wall thickening is stable since prior study, likely related to chronic bladder outlet obstruction. Stomach/Bowel: Normal appendix. Stomach, large and small bowel grossly unremarkable. Vascular/Lymphatic: Aortic atherosclerosis. No evidence of aneurysm or adenopathy. Reproductive: Prostate enlargement. Other: No free fluid or free air. Musculoskeletal: Sclerotic area within the left iliac bone is stable, likely bone island. Degenerative changes in the lumbar spine. IMPRESSION: Bilateral nephrolithiasis.  No ureteral stones or hydronephrosis. Numerous bilateral renal cysts are stable since prior study. Prostate enlargement. Mild bladder wall thickening and several bladder wall diverticula, unchanged. No acute findings. Electronically Signed   By: Charlett Nose M.D.   On: 04/05/2022 23:55   DG Chest Port 1 View  Result Date: 04/05/2022 CLINICAL  DATA:  Sepsis EXAM: PORTABLE CHEST 1 VIEW COMPARISON:  07/16/2021 FINDINGS: 2 frontal views of the chest demonstrate an unremarkable cardiac silhouette. Chronic scarring throughout the lungs without focal airspace disease, effusion, or pneumothorax. No acute bony abnormalities. Bilateral renal calculi are identified, measuring up to 2.1 cm on the right and 0.8 cm on the left. IMPRESSION: 1. No acute intrathoracic process.  Stable scarring. 2. Continued bilateral renal calculi as above. Electronically Signed   By: Sharlet Salina M.D.   On: 04/05/2022 20:16    Micro Results   Recent Results (from the past 240 hour(s))  Urine Culture     Status: None   Collection Time: 04/05/22  7:08 PM   Specimen: In/Out Cath Urine  Result Value Ref Range Status   Specimen Description   Final    IN/OUT CATH URINE Performed at [facility], [address] [place], [place] [address]    Special Requests   Final    NONE Performed at [facility], [address], [place], [place] [address]    Culture   Final    NO GROWTH Performed at [facility], [address] [place], [place] [address]    Report Status 04/07/2022 FINAL  Final  Blood Culture (routine x 2)     Status: None (Preliminary result)   Collection Time: 04/05/22  7:28 PM   Specimen: Left Antecubital; Blood  Result Value Ref Range Status   Specimen Description LEFT ANTECUBITAL  Final   Special Requests   Final    BOTTLES DRAWN AEROBIC AND ANAEROBIC Blood Culture adequate volume   Culture   Final    NO GROWTH 4 DAYS Performed at [facility], [address], [place], [place] [address]    Report Status PENDING  Incomplete  Blood Culture (routine x 2)     Status: None (Preliminary result)   Collection Time: 04/05/22  7:35 PM   Specimen: BLOOD LEFT FOREARM  Result Value Ref Range Status   Specimen Description BLOOD LEFT FOREARM  Final   Special Requests   Final    BOTTLES DRAWN AEROBIC AND ANAEROBIC Blood Culture adequate volume   Culture    Final    NO GROWTH 4 DAYS Performed at [facility], [address], [place], [place] [address]    Report Status PENDING  Incomplete  MRSA Next Gen by PCR, Nasal     Status: Abnormal   Collection Time: 04/05/22  9:35 PM   Specimen: Nasal Mucosa; Nasal Swab  Result Value Ref Range Status   MRSA by PCR Next Gen DETECTED (A) NOT DETECTED Final    Comment: RESULT CALLED TO, READ BACK BY AND VERIFIED WITH: TEASLEY,B ON 04/06/2022 @ 0228 BY ACOSTA,A Performed at [facility], [address], [place], [place] [address]     Today   Subjective    Orren Lange today has no new complaints, -Oral intake is fair if staff feeds him No emesis         Patient has been seen and examined prior to discharge   Objective   Blood pressure (!) 144/90, pulse (!) 56, temperature 98.2 F (36.8 C), resp. rate 18, height 5\' 5"  (1.651 m), weight 55.6 kg, SpO2 99 %.   Intake/Output Summary (Last 24 hours) at 04/09/2022 1344 Last data filed at 04/09/2022 1034 Gross per 24 hour  Intake 600 ml  Output 3350 ml  Net -2750 ml    Exam Gen:- resting,in no apparent distress --Mentation improved   HEENT:- [place].AT, No sclera icterus Neck-Supple Neck,No JVD,.  Lungs- fair symmetrical air movement, no wheezing CV- S1, S2 normal, regular  Abd-  +ve B.Sounds, Abd Soft, No tenderness,    Extremity/Skin:- No  edema, pedal pulses present  Psych-affect is appropriate, cognitive and memory deficits noted Neuro-generalized weakness, no new focal deficits, no tremors   Data Review   CBC w Diff:  Lab Results  Component Value Date   WBC 3.5 (L) 04/08/2022   HGB 12.7 (L) 04/08/2022   HCT 38.8 (L) 04/08/2022   PLT 130 (L) 04/08/2022   LYMPHOPCT 19 04/05/2022   MONOPCT 10 04/05/2022   EOSPCT 1 04/05/2022   BASOPCT 0 04/05/2022    CMP:  Lab Results  Component Value Date   NA 141 04/08/2022   K 3.7 04/08/2022   CL 111 04/08/2022   CO2 25 04/08/2022   BUN 13 04/08/2022   CREATININE 1.35 (H) 04/08/2022    PROT 5.5 (L) 04/06/2022   ALBUMIN 2.7 (L) 04/06/2022   BILITOT 0.3 04/06/2022   ALKPHOS 59 04/06/2022   AST 13 (L) 04/06/2022   ALT 7 04/06/2022  .  Total Discharge time is about 33 minutes  04/08/2022 M.D on 04/09/2022 at 1:44 PM  Go to [email or web address] -  for contact info  [facility] - Office  [phone]

## 2022-04-09 NOTE — TOC Transition Note (Signed)
Transition of Care [facility]) - CM/SW Discharge Note   Patient Details  Name: Robert Hurst MRN: [id number] Date of Birth: 02/22/1932  [facility] CM/SW Contact:  Elliot Gault, LCSW Phone Number: 04/09/2022, 11:34 AM   Clinical Narrative:     Pt stable for dc back to [facility] today per MD. Updated Debbie at [facility] who states pt will return to room B19 bed 1. Updated RN who will call report once dc summary complete. DC clinical sent electronically. EMS will be arranged.  Dtr updated. No other TOC needs for dc.  Final next level of care: Long Term Nursing Home Barriers to Discharge: Barriers Resolved   Patient Goals and CMS Choice Patient states their goals for this hospitalization and ongoing recovery are:: return home      Discharge Placement                       Discharge Plan and Services In-house Referral: Clinical Social Work                                   Social Determinants of Health (SDOH) Interventions     Readmission Risk Interventions    07/18/2021    3:10 PM 06/25/2021   10:25 AM 02/12/2021    1:36 PM  Readmission Risk Prevention Plan  Medication Screening  Complete   Transportation Screening Complete Complete Complete  PCP or Specialist Appt within 5-7 Days Complete  Complete  Home Care Screening Complete  Complete  Medication Review (RN CM) Complete  Complete

## 2022-04-11 LAB — CULTURE, BLOOD (ROUTINE X 2)
Culture: NO GROWTH
Culture: NO GROWTH
Special Requests: ADEQUATE
Special Requests: ADEQUATE

## 2023-05-20 ENCOUNTER — Encounter (HOSPITAL_COMMUNITY): Payer: Self-pay | Admitting: Emergency Medicine

## 2023-05-20 ENCOUNTER — Other Ambulatory Visit: Payer: Self-pay

## 2023-05-20 ENCOUNTER — Emergency Department (HOSPITAL_COMMUNITY)
Admission: EM | Admit: 2023-05-20 | Discharge: 2023-05-20 | Disposition: A | Discharge status: Home / Self Care | Payer: Medicare Other | Attending: Emergency Medicine | Admitting: Emergency Medicine

## 2023-05-20 ENCOUNTER — Emergency Department (HOSPITAL_COMMUNITY): Payer: Medicare Other

## 2023-05-20 DIAGNOSIS — R7309 Other abnormal glucose: Secondary | ICD-10-CM | POA: Insufficient documentation

## 2023-05-20 DIAGNOSIS — R4182 Altered mental status, unspecified: Secondary | ICD-10-CM | POA: Insufficient documentation

## 2023-05-20 DIAGNOSIS — Z20822 Contact with and (suspected) exposure to covid-19: Secondary | ICD-10-CM | POA: Diagnosis not present

## 2023-05-20 DIAGNOSIS — F0390 Unspecified dementia without behavioral disturbance: Secondary | ICD-10-CM | POA: Diagnosis not present

## 2023-05-20 LAB — CBC WITH DIFFERENTIAL/PLATELET
Abs Immature Granulocytes: 0.08 10*3/uL — ABNORMAL HIGH (ref 0.00–0.07)
Basophils Absolute: 0 10*3/uL (ref 0.0–0.1)
Basophils Relative: 0 %
Eosinophils Absolute: 0.1 10*3/uL (ref 0.0–0.5)
Eosinophils Relative: 1 %
HCT: 48.8 % (ref 39.0–52.0)
Hemoglobin: 15.4 g/dL (ref 13.0–17.0)
Immature Granulocytes: 1 %
Lymphocytes Relative: 19 %
Lymphs Abs: 1.8 10*3/uL (ref 0.7–4.0)
MCH: 29.2 pg (ref 26.0–34.0)
MCHC: 31.6 g/dL (ref 30.0–36.0)
MCV: 92.6 fL (ref 80.0–100.0)
Monocytes Absolute: 1.5 10*3/uL — ABNORMAL HIGH (ref 0.1–1.0)
Monocytes Relative: 15 %
Neutro Abs: 6.2 10*3/uL (ref 1.7–7.7)
Neutrophils Relative %: 64 %
Platelets: 165 10*3/uL (ref 150–400)
RBC: 5.27 MIL/uL (ref 4.22–5.81)
RDW: 16.2 % — ABNORMAL HIGH (ref 11.5–15.5)
WBC: 9.6 10*3/uL (ref 4.0–10.5)
nRBC: 0 % (ref 0.0–0.2)

## 2023-05-20 LAB — URINALYSIS, MICROSCOPIC (REFLEX)

## 2023-05-20 LAB — COMPREHENSIVE METABOLIC PANEL
ALT: 15 U/L (ref 0–44)
AST: 24 U/L (ref 15–41)
Albumin: 4.1 g/dL (ref 3.5–5.0)
Alkaline Phosphatase: 109 U/L (ref 38–126)
Anion gap: 14 (ref 5–15)
BUN: 21 mg/dL (ref 8–23)
CO2: 20 mmol/L — ABNORMAL LOW (ref 22–32)
Calcium: 9.5 mg/dL (ref 8.9–10.3)
Chloride: 105 mmol/L (ref 98–111)
Creatinine, Ser: 1.78 mg/dL — ABNORMAL HIGH (ref 0.61–1.24)
GFR, Estimated: 36 mL/min — ABNORMAL LOW (ref >60)
Glucose, Bld: 124 mg/dL — ABNORMAL HIGH (ref 70–99)
Potassium: 4 mmol/L (ref 3.5–5.1)
Sodium: 139 mmol/L (ref 135–145)
Total Bilirubin: 0.7 mg/dL (ref 0.3–1.2)
Total Protein: 8.3 g/dL — ABNORMAL HIGH (ref 6.5–8.1)

## 2023-05-20 LAB — URINALYSIS, ROUTINE W REFLEX MICROSCOPIC
Bilirubin Urine: NEGATIVE
Glucose, UA: NEGATIVE mg/dL
Ketones, ur: NEGATIVE mg/dL
Leukocytes,Ua: NEGATIVE
Nitrite: NEGATIVE
Specific Gravity, Urine: 1.015 (ref 1.005–1.030)
pH: 6 (ref 5.0–8.0)

## 2023-05-20 LAB — CBG MONITORING, ED: Glucose-Capillary: 148 mg/dL — ABNORMAL HIGH (ref 70–99)

## 2023-05-20 LAB — SARS CORONAVIRUS 2 BY RT PCR: SARS Coronavirus 2 by RT PCR: NEGATIVE

## 2023-05-20 MED ORDER — HALOPERIDOL 0.5 MG PO TABS: 2.0000 mg | ORAL_TABLET | Freq: Once | ORAL | Status: DC

## 2023-05-20 MED ORDER — HALOPERIDOL LACTATE 5 MG/ML IJ SOLN
2.0000 mg | Freq: Once | INTRAMUSCULAR | Status: AC
  Administered 2023-05-20: 2 mg via INTRAVENOUS
  Filled 2023-05-20: qty 1

## 2023-05-20 MED ORDER — HALOPERIDOL LACTATE 5 MG/ML IJ SOLN: 2.0000 mg | Freq: Once | INTRAMUSCULAR | Status: DC

## 2023-05-20 NOTE — ED Notes (Signed)
Attempted report to SNF without success. AVS summary reviewed with daughter [name] at bedside. Family aware pt to return to facility at this time.Secretary working on EMS transport back to facility.

## 2023-05-20 NOTE — ED Provider Notes (Signed)
[place] EMERGENCY DEPARTMENT AT [facility] Provider Note   CSN: [id number] Arrival date & time: 05/20/23  1437     History  Chief Complaint  Patient presents with   Altered Mental Status    Robert Hurst is a 89 y.o. male.  HPI Patient with a history of dementia presents from nursing facility after staff was concerned the patient was not interacting in a typical manner.  Patient cannot provide any details, level 5 caveat, secondary to his dementia. No nursing home staff report of fall, trauma, fever, hemodynamic instability.     Home Medications Prior to Admission medications   Medication Sig Start Date End Date Taking? Authorizing Provider  acetaminophen (TYLENOL) 325 MG tablet Take 2 tablets (650 mg total) by mouth every 6 (six) hours as needed for mild pain, fever or headache (fever >/= 101). Patient not taking: Reported on 04/07/2022 05/26/20   Standley Dakins L, MD  cyanocobalamin (,VITAMIN B-12,) 1000 MCG/ML injection Inject 1 mL (1,000 mcg total) into the muscle every 30 (thirty) days. Give B12 injection Monthly around the 15 th of Every Month 04/09/22   Shon Hale, MD  feeding supplement (ENSURE ENLIVE / ENSURE PLUS) LIQD Take 237 mLs by mouth 3 (three) times daily. 04/09/22   Shon Hale, MD  levothyroxine (SYNTHROID) 25 MCG tablet Take 1 tablet (25 mcg total) by mouth daily before breakfast. 06/27/21   Mariea Clonts, Courage, MD  memantine (NAMENDA) 10 MG tablet Take 10 mg by mouth 2 (two) times daily.    [provider]  ondansetron (ZOFRAN) 4 MG tablet Take 1 tablet (4 mg total) by mouth every 6 (six) hours as needed for nausea. 02/13/21   Shon Hale, MD      Allergies    Penicillins    Review of Systems   Review of Systems  Unable to perform ROS: Dementia    Physical Exam Updated Vital Signs BP 98/73   Pulse 60   Temp 98 F (36.7 C) (Axillary)   Resp 18   Ht 5\' 5"  (1.651 m)   Wt 55 kg   SpO2 99%   BMI 20.18 kg/m  Physical  Exam Vitals and nursing note reviewed.  Constitutional:      General: He is not in acute distress.    Appearance: He is well-developed.     Comments: Chronically ill-appearing elderly male in no acute distress  HENT:     Head: Normocephalic and atraumatic.  Eyes:     Conjunctiva/sclera: Conjunctivae normal.  Cardiovascular:     Rate and Rhythm: Normal rate and regular rhythm.  Pulmonary:     Effort: Pulmonary effort is normal. No respiratory distress.     Breath sounds: No stridor.  Abdominal:     General: There is no distension.  Skin:    General: Skin is warm and dry.  Neurological:     Motor: Atrophy present.     Comments: Minimally interactive, diffuse atrophy, does not follow commands, no gross facial asymmetry.  Psychiatric:        Cognition and Memory: Cognition is impaired. Memory is impaired.     ED Results / Procedures / Treatments   Labs (all labs ordered are listed, but only abnormal results are displayed) Labs Reviewed  COMPREHENSIVE METABOLIC PANEL - Abnormal; Notable for the following components:      Result Value   CO2 20 (*)    Glucose, Bld 124 (*)    Creatinine, Ser 1.78 (*)    Total Protein 8.3 (*)  GFR, Estimated 36 (*)    All other components within normal limits  CBC WITH DIFFERENTIAL/PLATELET - Abnormal; Notable for the following components:   RDW 16.2 (*)    Monocytes Absolute 1.5 (*)    Abs Immature Granulocytes 0.08 (*)    All other components within normal limits  URINALYSIS, ROUTINE W REFLEX MICROSCOPIC - Abnormal; Notable for the following components:   Hgb urine dipstick MODERATE (*)    Protein, ur TRACE (*)    All other components within normal limits  URINALYSIS, MICROSCOPIC (REFLEX) - Abnormal; Notable for the following components:   Bacteria, UA RARE (*)    All other components within normal limits  CBG MONITORING, ED - Abnormal; Notable for the following components:   Glucose-Capillary 148 (*)    All other components within  normal limits  SARS CORONAVIRUS 2 BY RT PCR    EKG None  Radiology CT Head Wo Contrast  Result Date: 05/20/2023 CLINICAL DATA:  Mental status change with unknown cause EXAM: CT HEAD WITHOUT CONTRAST TECHNIQUE: Contiguous axial images were obtained from the base of the skull through the vertex without intravenous contrast. RADIATION DOSE REDUCTION: This exam was performed according to the departmental dose-optimization program which includes automated exposure control, adjustment of the mA and/or kV according to patient size and/or use of iterative reconstruction technique. COMPARISON:  04/05/2022 FINDINGS: Brain: No evidence of acute infarction, hemorrhage, hydrocephalus, extra-axial collection or mass lesion/mass effect. Unchanged chronic small vessel ischemic low-density in the cerebral white matter and deep gray nuclei. 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 Vascular: No hyperdense vessel or unexpected calcification. Skull: Normal. Negative for fracture or focal lesion. Sinuses/Orbits: No acute finding. Variceal dilatation of the superior ophthalmic veins. IMPRESSION: 1. No acute or interval finding. 2. Advanced chronic small vessel ischemia. Electronically Signed   By: Tiburcio Pea M.D.   On: 05/20/2023 18:59    Procedures Procedures    Medications Ordered in ED Medications  haloperidol lactate (HALDOL) injection 2 mg (2 mg Intravenous Given 05/20/23 1725)    ED Course/ Medical Decision Making/ A&P                                 Medical Decision Making Elderly male with substantial disease burden including CKD, prior episodes of encephalopathy, Alzheimer's presents with staff concern of altered mental status.  Here the patient is in no distress, is minimally hypothermic, but not hypotensive or tachycardic, no initial physical exam obvious signs for infection, bacteremia, sepsis.  However, given the inability to obtain full history, labs CT  ordered. Cardiac 60 sinus normal Pulse ox 99% room air normal   Amount and/or Complexity of Data Reviewed External Data Reviewed: notes. Labs: ordered. Decision-making details documented in ED Course. Radiology: ordered and independent interpretation performed. Decision-making details documented in ED Course.  Risk Prescription drug management. Decision regarding hospitalization. Diagnosis or treatment significantly limited by social determinants of health.   9:03 PM Daughter at bedside and second daughter available via telephone video call.  They note the patient has had similar episodes in the past, typically with urinary tract infection. 9:03 PM Remained his reassuring COVID-negative, no urinary tract infection.  As above patient has no leukocytosis, no fever, given his generally reassuring findings in the context of an elderly male with known dementia, no current evidence for acute pathology.  This was discussed with daughter at bedside who had 4 siblings  on the phone, we discussed absence of acute findings, and his return to the nursing facility.       Final Clinical Impression(s) / ED Diagnoses Final diagnoses:  Altered mental status, unspecified altered mental status type    Rx / DC Orders ED Discharge Orders     None         Gerhard Munch, MD 05/20/23 2103

## 2023-05-20 NOTE — Discharge Instructions (Addendum)
Although today's evaluation did not demonstrate acute changes in your father's condition, if he develops new, or concerning changes do not hesitate to return here.

## 2023-05-20 NOTE — ED Triage Notes (Signed)
Per facility pt is not responding like he normally would. Pt responds to pain. Pt has dementia.

## 2023-08-24 DEATH — deceased
